# Patient Record
Sex: Female | Born: 1955
Health system: Southern US, Community
[De-identification: ages and names within clinical notes are randomized; demographics above are authoritative.]

## PROBLEM LIST (undated history)

## (undated) DIAGNOSIS — M109 Gout, unspecified: Secondary | ICD-10-CM

## (undated) DIAGNOSIS — I5021 Acute systolic (congestive) heart failure: Secondary | ICD-10-CM

## (undated) DIAGNOSIS — K219 Gastro-esophageal reflux disease without esophagitis: Secondary | ICD-10-CM

## (undated) DIAGNOSIS — I429 Cardiomyopathy, unspecified: Secondary | ICD-10-CM

## (undated) DIAGNOSIS — N3281 Overactive bladder: Secondary | ICD-10-CM

## (undated) DIAGNOSIS — T7840XA Allergy, unspecified, initial encounter: Secondary | ICD-10-CM

## (undated) DIAGNOSIS — I519 Heart disease, unspecified: Secondary | ICD-10-CM

## (undated) DIAGNOSIS — G959 Disease of spinal cord, unspecified: Secondary | ICD-10-CM

## (undated) DIAGNOSIS — M1990 Unspecified osteoarthritis, unspecified site: Secondary | ICD-10-CM

## (undated) DIAGNOSIS — M79606 Pain in leg, unspecified: Secondary | ICD-10-CM

## (undated) DIAGNOSIS — D649 Anemia, unspecified: Secondary | ICD-10-CM

## (undated) DIAGNOSIS — I10 Essential (primary) hypertension: Secondary | ICD-10-CM

## (undated) DIAGNOSIS — I509 Heart failure, unspecified: Secondary | ICD-10-CM

## (undated) DIAGNOSIS — E785 Hyperlipidemia, unspecified: Secondary | ICD-10-CM

## (undated) HISTORY — DX: Anemia, unspecified: D64.9

## (undated) HISTORY — DX: Disease of spinal cord, unspecified: G95.9

## (undated) HISTORY — DX: Gout, unspecified: M10.9

## (undated) HISTORY — PX: COLONOSCOPY: SHX174

## (undated) HISTORY — DX: Cardiomyopathy, unspecified: I42.9

## (undated) HISTORY — DX: Pain in leg, unspecified: M79.606

## (undated) HISTORY — DX: Hyperlipidemia, unspecified: E78.5

## (undated) HISTORY — PX: KNEE ARTHROSCOPY: SUR90

## (undated) HISTORY — DX: Acute systolic (congestive) heart failure: I50.21

## (undated) HISTORY — DX: Overactive bladder: N32.81

## (undated) HISTORY — DX: Heart disease, unspecified: I51.9

## (undated) HISTORY — DX: Allergy, unspecified, initial encounter: T78.40XA

## (undated) HISTORY — DX: Gastro-esophageal reflux disease without esophagitis: K21.9

## (undated) HISTORY — DX: Heart failure, unspecified: I50.9

## (undated) HISTORY — DX: Essential (primary) hypertension: I10

---

## 2011-09-10 ENCOUNTER — Other Ambulatory Visit (HOSPITAL_COMMUNITY): Payer: Self-pay | Admitting: *Deleted

## 2011-09-11 ENCOUNTER — Other Ambulatory Visit: Payer: Self-pay | Admitting: Orthopedic Surgery

## 2011-09-11 ENCOUNTER — Encounter (HOSPITAL_COMMUNITY): Payer: Self-pay

## 2011-09-11 ENCOUNTER — Encounter (HOSPITAL_COMMUNITY)
Admission: RE | Admit: 2011-09-11 | Discharge: 2011-09-11 | Disposition: A | Discharge status: Home / Self Care | Payer: BC Managed Care – PPO | Source: Ambulatory Visit | Attending: Orthopedic Surgery | Admitting: Orthopedic Surgery

## 2011-09-11 HISTORY — DX: Unspecified osteoarthritis, unspecified site: M19.90

## 2011-09-11 LAB — URINALYSIS, ROUTINE W REFLEX MICROSCOPIC
Bilirubin Urine: NEGATIVE
Ketones, ur: NEGATIVE mg/dL
Nitrite: NEGATIVE
Protein, ur: NEGATIVE mg/dL
pH: 6 (ref 5.0–8.0)

## 2011-09-11 LAB — COMPREHENSIVE METABOLIC PANEL
ALT: 29 U/L (ref 0–35)
AST: 28 U/L (ref 0–37)
Alkaline Phosphatase: 96 U/L (ref 39–117)
CO2: 26 mEq/L (ref 19–32)
Chloride: 106 mEq/L (ref 96–112)
GFR calc Af Amer: >90 mL/min (ref >90)
GFR calc non Af Amer: >90 mL/min (ref >90)
Glucose, Bld: 103 mg/dL — ABNORMAL HIGH (ref 70–99)
Potassium: 4.4 mEq/L (ref 3.5–5.1)
Sodium: 141 mEq/L (ref 135–145)
Total Bilirubin: 0.2 mg/dL — ABNORMAL LOW (ref 0.3–1.2)

## 2011-09-11 LAB — DIFFERENTIAL
Basophils Absolute: 0 10*3/uL (ref 0.0–0.1)
Eosinophils Absolute: 0.3 10*3/uL (ref 0.0–0.7)
Eosinophils Relative: 5 % (ref 0–5)
Lymphocytes Relative: 40 % (ref 12–46)
Monocytes Absolute: 0.6 10*3/uL (ref 0.1–1.0)

## 2011-09-11 LAB — CBC
Hemoglobin: 13.2 g/dL (ref 12.0–15.0)
MCH: 29 pg (ref 26.0–34.0)
Platelets: 263 10*3/uL (ref 150–400)
RBC: 4.55 MIL/uL (ref 3.87–5.11)
WBC: 5.2 10*3/uL (ref 4.0–10.5)

## 2011-09-11 LAB — URINE MICROSCOPIC-ADD ON

## 2011-09-11 NOTE — Progress Notes (Signed)
REQUESTED CXR AND EKG FROM [facility].

## 2011-09-11 NOTE — Pre-Procedure Instructions (Signed)
20 Briana Miller  09/11/2011   Your procedure is scheduled on:  Monday  09/22/11  Report to [facility] at 800 AM.  Call this number if you have problems the morning of surgery: [phone]   Remember:   Do not eat food:After Midnight.  May have clear liquids: up to 4 Hours before arrival.  Clear liquids include soda, tea, black coffee, apple or grape juice, broth.  Take these medicines the morning of surgery with A SIP OF WATER: NONE    Do not wear jewelry, make-up or nail polish.  Do not wear lotions, powders, or perfumes. You may wear deodorant.  Do not shave 48 hours prior to surgery.  Do not bring valuables to the hospital.  Contacts, dentures or bridgework may not be worn into surgery.  Leave suitcase in the car. After surgery it may be brought to your room.  For patients admitted to the hospital, checkout time is 11:00 AM the day of discharge.   Patients discharged the day of surgery will not be allowed to drive home.  Name and phone number of your driver:   Special Instructions: CHG Shower Use Special Wash: 1/2 bottle night before surgery and 1/2 bottle morning of surgery.   Please read over the following fact sheets that you were given: Pain Booklet, Coughing and Deep Breathing, Blood Transfusion Information, Total Joint Packet, MRSA Information and Surgical Site Infection Prevention

## 2011-09-12 LAB — URINE CULTURE
Colony Count: NO GROWTH
Culture  Setup Time: 201303281354

## 2011-09-21 MED ORDER — CEFAZOLIN SODIUM-DEXTROSE 2-3 GM-% IV SOLR
2.0000 g | INTRAVENOUS | Status: AC
  Administered 2011-09-22: 2 g via INTRAVENOUS
  Filled 2011-09-21: qty 50

## 2011-09-22 ENCOUNTER — Encounter (HOSPITAL_COMMUNITY): Payer: Self-pay | Admitting: *Deleted

## 2011-09-22 ENCOUNTER — Encounter (HOSPITAL_COMMUNITY): Payer: Self-pay | Admitting: Anesthesiology

## 2011-09-22 ENCOUNTER — Ambulatory Visit (HOSPITAL_COMMUNITY): Payer: BC Managed Care – PPO | Admitting: Anesthesiology

## 2011-09-22 ENCOUNTER — Encounter (HOSPITAL_COMMUNITY)
Admission: RE | Disposition: A | Discharge status: Home Health | Payer: Self-pay | Source: Ambulatory Visit | Attending: Orthopedic Surgery

## 2011-09-22 ENCOUNTER — Inpatient Hospital Stay (HOSPITAL_COMMUNITY)
Admission: RE | Admit: 2011-09-22 | Discharge: 2011-09-25 | DRG: 209 | Disposition: A | Discharge status: Home Health | Payer: BC Managed Care – PPO | Source: Ambulatory Visit | Attending: Orthopedic Surgery | Admitting: Orthopedic Surgery

## 2011-09-22 DIAGNOSIS — Z6841 Body Mass Index (BMI) 40.0 and over, adult: Secondary | ICD-10-CM

## 2011-09-22 DIAGNOSIS — M1710 Unilateral primary osteoarthritis, unspecified knee: Principal | ICD-10-CM | POA: Diagnosis present

## 2011-09-22 DIAGNOSIS — D62 Acute posthemorrhagic anemia: Secondary | ICD-10-CM | POA: Diagnosis not present

## 2011-09-22 DIAGNOSIS — Z01811 Encounter for preprocedural respiratory examination: Secondary | ICD-10-CM

## 2011-09-22 DIAGNOSIS — Z01812 Encounter for preprocedural laboratory examination: Secondary | ICD-10-CM

## 2011-09-22 DIAGNOSIS — M1711 Unilateral primary osteoarthritis, right knee: Secondary | ICD-10-CM

## 2011-09-22 HISTORY — PX: TOTAL KNEE ARTHROPLASTY: SHX125

## 2011-09-22 LAB — PROTIME-INR: Prothrombin Time: 15.2 seconds (ref 11.6–15.2)

## 2011-09-22 LAB — TYPE AND SCREEN: ABO/RH(D): O POS

## 2011-09-22 LAB — APTT: aPTT: 27 seconds (ref 24–37)

## 2011-09-22 SURGERY — ARTHROPLASTY, KNEE, TOTAL
Anesthesia: General | Site: Knee | Laterality: Right | Wound class: Clean

## 2011-09-22 MED ORDER — METOCLOPRAMIDE HCL 10 MG PO TABS: 5.0000 mg | ORAL_TABLET | Freq: Three times a day (TID) | ORAL | Status: DC | PRN

## 2011-09-22 MED ORDER — MIDAZOLAM HCL 2 MG/2ML IJ SOLN
INTRAMUSCULAR | Status: AC
  Filled 2011-09-22: qty 2

## 2011-09-22 MED ORDER — SODIUM CHLORIDE 0.9 % IV SOLN
INTRAVENOUS | Status: DC
  Administered 2011-09-22 – 2011-09-23 (×2): via INTRAVENOUS

## 2011-09-22 MED ORDER — PHENOL 1.4 % MT LIQD: 1.0000 | OROMUCOSAL | Status: DC | PRN

## 2011-09-22 MED ORDER — ONDANSETRON HCL 4 MG/2ML IJ SOLN: 4.0000 mg | Freq: Four times a day (QID) | INTRAMUSCULAR | Status: DC | PRN

## 2011-09-22 MED ORDER — BUPIVACAINE-EPINEPHRINE PF 0.5-1:200000 % IJ SOLN
INTRAMUSCULAR | Status: DC | PRN
  Administered 2011-09-22: 30 mL

## 2011-09-22 MED ORDER — CHLORHEXIDINE GLUCONATE 4 % EX LIQD: 60.0000 mL | Freq: Once | CUTANEOUS | Status: DC

## 2011-09-22 MED ORDER — LACTATED RINGERS IV SOLN
INTRAVENOUS | Status: DC | PRN
  Administered 2011-09-22 (×2): via INTRAVENOUS

## 2011-09-22 MED ORDER — METOCLOPRAMIDE HCL 5 MG/ML IJ SOLN: 5.0000 mg | Freq: Three times a day (TID) | INTRAMUSCULAR | Status: DC | PRN

## 2011-09-22 MED ORDER — DOCUSATE SODIUM 100 MG PO CAPS
100.0000 mg | ORAL_CAPSULE | Freq: Two times a day (BID) | ORAL | Status: DC
  Administered 2011-09-22 – 2011-09-25 (×7): 100 mg via ORAL
  Filled 2011-09-22 (×9): qty 1

## 2011-09-22 MED ORDER — NEOSTIGMINE METHYLSULFATE 1 MG/ML IJ SOLN
INTRAMUSCULAR | Status: DC | PRN
  Administered 2011-09-22: 3 mg via INTRAVENOUS

## 2011-09-22 MED ORDER — GLYCOPYRROLATE 0.2 MG/ML IJ SOLN
INTRAMUSCULAR | Status: DC | PRN
  Administered 2011-09-22: 0.4 mg via INTRAVENOUS

## 2011-09-22 MED ORDER — OXYCODONE HCL 10 MG PO TB12
10.0000 mg | ORAL_TABLET | Freq: Two times a day (BID) | ORAL | Status: DC
  Administered 2011-09-22 – 2011-09-25 (×5): 10 mg via ORAL
  Filled 2011-09-22 (×6): qty 1

## 2011-09-22 MED ORDER — HYDROMORPHONE HCL PF 1 MG/ML IJ SOLN
INTRAMUSCULAR | Status: AC
  Filled 2011-09-22: qty 1

## 2011-09-22 MED ORDER — SODIUM CHLORIDE 0.9 % IV SOLN: INTRAVENOUS | Status: DC

## 2011-09-22 MED ORDER — METHOCARBAMOL 500 MG PO TABS
500.0000 mg | ORAL_TABLET | Freq: Four times a day (QID) | ORAL | Status: DC | PRN
  Administered 2011-09-22 – 2011-09-25 (×5): 500 mg via ORAL
  Filled 2011-09-22 (×6): qty 1

## 2011-09-22 MED ORDER — CELECOXIB 200 MG PO CAPS
200.0000 mg | ORAL_CAPSULE | Freq: Two times a day (BID) | ORAL | Status: DC
  Administered 2011-09-22 – 2011-09-25 (×7): 200 mg via ORAL
  Filled 2011-09-22 (×9): qty 1

## 2011-09-22 MED ORDER — CEFAZOLIN SODIUM-DEXTROSE 2-3 GM-% IV SOLR
2.0000 g | Freq: Four times a day (QID) | INTRAVENOUS | Status: AC
  Administered 2011-09-22 – 2011-09-23 (×3): 2 g via INTRAVENOUS
  Filled 2011-09-22 (×3): qty 50

## 2011-09-22 MED ORDER — ACETAMINOPHEN 650 MG RE SUPP: 650.0000 mg | Freq: Four times a day (QID) | RECTAL | Status: DC | PRN

## 2011-09-22 MED ORDER — BUPIVACAINE ON-Q PAIN PUMP (FOR ORDER SET NO CHG)
INJECTION | Status: DC
  Filled 2011-09-22: qty 1

## 2011-09-22 MED ORDER — METHOCARBAMOL 100 MG/ML IJ SOLN
500.0000 mg | Freq: Four times a day (QID) | INTRAVENOUS | Status: DC | PRN
  Administered 2011-09-22: 500 mg via INTRAVENOUS
  Filled 2011-09-22: qty 5

## 2011-09-22 MED ORDER — DIPHENHYDRAMINE HCL 12.5 MG/5ML PO ELIX: 12.5000 mg | ORAL_SOLUTION | ORAL | Status: DC | PRN

## 2011-09-22 MED ORDER — BUPIVACAINE 0.25 % ON-Q PUMP SINGLE CATH 400 ML
INJECTION | Status: DC | PRN
  Administered 2011-09-22: 300 mL

## 2011-09-22 MED ORDER — ONDANSETRON HCL 4 MG/2ML IJ SOLN
INTRAMUSCULAR | Status: DC | PRN
  Administered 2011-09-22: 4 mg via INTRAVENOUS

## 2011-09-22 MED ORDER — FENTANYL CITRATE 0.05 MG/ML IJ SOLN
INTRAMUSCULAR | Status: DC | PRN
  Administered 2011-09-22: 100 ug via INTRAVENOUS
  Administered 2011-09-22 (×2): 50 ug via INTRAVENOUS
  Administered 2011-09-22: 100 ug via INTRAVENOUS

## 2011-09-22 MED ORDER — CEFAZOLIN SODIUM-DEXTROSE 2-3 GM-% IV SOLR: 2.0000 g | INTRAVENOUS | Status: DC

## 2011-09-22 MED ORDER — ONDANSETRON HCL 4 MG PO TABS: 4.0000 mg | ORAL_TABLET | Freq: Four times a day (QID) | ORAL | Status: DC | PRN

## 2011-09-22 MED ORDER — ACETAMINOPHEN 325 MG PO TABS: 650.0000 mg | ORAL_TABLET | Freq: Four times a day (QID) | ORAL | Status: DC | PRN

## 2011-09-22 MED ORDER — LACTATED RINGERS IV SOLN: INTRAVENOUS | Status: DC

## 2011-09-22 MED ORDER — ACETAMINOPHEN 10 MG/ML IV SOLN: 1000.0000 mg | Freq: Four times a day (QID) | INTRAVENOUS | Status: DC

## 2011-09-22 MED ORDER — FENTANYL CITRATE 0.05 MG/ML IJ SOLN
100.0000 ug | INTRAMUSCULAR | Status: DC | PRN
  Administered 2011-09-22: 100 ug via INTRAVENOUS

## 2011-09-22 MED ORDER — BUPIVACAINE 0.25 % ON-Q PUMP SINGLE CATH 300ML
300.0000 mL | INJECTION | Status: DC
  Filled 2011-09-22: qty 300

## 2011-09-22 MED ORDER — OXYCODONE HCL 5 MG PO TABS
5.0000 mg | ORAL_TABLET | ORAL | Status: DC | PRN
  Administered 2011-09-23 – 2011-09-24 (×5): 10 mg via ORAL
  Administered 2011-09-24 (×2): 5 mg via ORAL
  Administered 2011-09-25: 10 mg via ORAL
  Administered 2011-09-25: 5 mg via ORAL
  Filled 2011-09-22: qty 1
  Filled 2011-09-22 (×4): qty 2
  Filled 2011-09-22: qty 1
  Filled 2011-09-22: qty 2
  Filled 2011-09-22: qty 1
  Filled 2011-09-22: qty 2

## 2011-09-22 MED ORDER — ZOLPIDEM TARTRATE 5 MG PO TABS: 5.0000 mg | ORAL_TABLET | Freq: Every evening | ORAL | Status: DC | PRN

## 2011-09-22 MED ORDER — DROPERIDOL 2.5 MG/ML IJ SOLN
INTRAMUSCULAR | Status: DC | PRN
  Administered 2011-09-22: 0.625 mg via INTRAVENOUS

## 2011-09-22 MED ORDER — ENOXAPARIN SODIUM 40 MG/0.4ML ~~LOC~~ SOLN
40.0000 mg | SUBCUTANEOUS | Status: DC
  Administered 2011-09-23 – 2011-09-25 (×3): 40 mg via SUBCUTANEOUS
  Filled 2011-09-22 (×5): qty 0.4

## 2011-09-22 MED ORDER — BISACODYL 5 MG PO TBEC
5.0000 mg | DELAYED_RELEASE_TABLET | Freq: Every day | ORAL | Status: DC | PRN
  Administered 2011-09-24: 5 mg via ORAL
  Filled 2011-09-22: qty 1

## 2011-09-22 MED ORDER — SODIUM CHLORIDE 0.9 % IR SOLN
Status: DC | PRN
  Administered 2011-09-22: 3000 mL

## 2011-09-22 MED ORDER — FENTANYL CITRATE 0.05 MG/ML IJ SOLN
INTRAMUSCULAR | Status: AC
  Filled 2011-09-22: qty 2

## 2011-09-22 MED ORDER — MIDAZOLAM HCL 2 MG/2ML IJ SOLN
2.0000 mg | INTRAMUSCULAR | Status: DC | PRN
  Administered 2011-09-22: 2 mg via INTRAVENOUS

## 2011-09-22 MED ORDER — BUPIVACAINE-EPINEPHRINE 0.25% -1:200000 IJ SOLN
INTRAMUSCULAR | Status: DC | PRN
  Administered 2011-09-22: 30 mL

## 2011-09-22 MED ORDER — FERROUS SULFATE 325 (65 FE) MG PO TABS
325.0000 mg | ORAL_TABLET | Freq: Three times a day (TID) | ORAL | Status: DC
  Administered 2011-09-22 – 2011-09-25 (×8): 325 mg via ORAL
  Filled 2011-09-22 (×11): qty 1

## 2011-09-22 MED ORDER — FLEET ENEMA 7-19 GM/118ML RE ENEM: 1.0000 | ENEMA | Freq: Once | RECTAL | Status: AC | PRN

## 2011-09-22 MED ORDER — SENNOSIDES-DOCUSATE SODIUM 8.6-50 MG PO TABS
1.0000 | ORAL_TABLET | Freq: Every evening | ORAL | Status: DC | PRN
  Administered 2011-09-24: 1 via ORAL
  Filled 2011-09-22: qty 1

## 2011-09-22 MED ORDER — ROCURONIUM BROMIDE 100 MG/10ML IV SOLN
INTRAVENOUS | Status: DC | PRN
  Administered 2011-09-22: 50 mg via INTRAVENOUS

## 2011-09-22 MED ORDER — HYDROMORPHONE HCL PF 1 MG/ML IJ SOLN
0.5000 mg | INTRAMUSCULAR | Status: DC | PRN
Start: 2011-09-22 — End: 2011-09-25
  Administered 2011-09-22 – 2011-09-25 (×5): 1 mg via INTRAVENOUS
  Filled 2011-09-22 (×4): qty 1

## 2011-09-22 MED ORDER — MENTHOL 3 MG MT LOZG: 1.0000 | LOZENGE | OROMUCOSAL | Status: DC | PRN

## 2011-09-22 MED ORDER — HYDROMORPHONE HCL PF 1 MG/ML IJ SOLN
0.2500 mg | INTRAMUSCULAR | Status: DC | PRN
  Administered 2011-09-22 (×2): 0.5 mg via INTRAVENOUS

## 2011-09-22 MED ORDER — DARIFENACIN HYDROBROMIDE ER 7.5 MG PO TB24
7.5000 mg | ORAL_TABLET | Freq: Every day | ORAL | Status: DC
  Administered 2011-09-22 – 2011-09-25 (×4): 7.5 mg via ORAL
  Filled 2011-09-22 (×4): qty 1

## 2011-09-22 MED ORDER — PROPOFOL 10 MG/ML IV EMUL
INTRAVENOUS | Status: DC | PRN
  Administered 2011-09-22: 200 mg via INTRAVENOUS

## 2011-09-22 SURGICAL SUPPLY — 56 items
BANDAGE ESMARK 6X9 LF (GAUZE/BANDAGES/DRESSINGS) ×1 IMPLANT
BLADE SAGITTAL 13X1.27X60 (BLADE) ×2 IMPLANT
BLADE SAW SGTL 83.5X18.5 (BLADE) ×2 IMPLANT
BNDG ESMARK 6X9 LF (GAUZE/BANDAGES/DRESSINGS) ×2
BOWL SMART MIX CTS (DISPOSABLE) ×2 IMPLANT
CATH KIT ON Q 5IN SLV (PAIN MANAGEMENT) ×2 IMPLANT
CEMENT BONE SIMPLEX SPEEDSET (Cement) ×4 IMPLANT
CLOTH BEACON ORANGE TIMEOUT ST (SAFETY) ×2 IMPLANT
COVER BACK TABLE 24X17X13 BIG (DRAPES) ×2 IMPLANT
COVER SURGICAL LIGHT HANDLE (MISCELLANEOUS) ×2 IMPLANT
CUFF TOURNIQUET SINGLE 34IN LL (TOURNIQUET CUFF) ×2 IMPLANT
DRAPE EXTREMITY T 121X128X90 (DRAPE) ×2 IMPLANT
DRAPE INCISE IOBAN 66X45 STRL (DRAPES) ×4 IMPLANT
DRAPE PROXIMA HALF (DRAPES) ×2 IMPLANT
DRAPE U-SHAPE 47X51 STRL (DRAPES) ×2 IMPLANT
DRSG ADAPTIC 3X8 NADH LF (GAUZE/BANDAGES/DRESSINGS) ×2 IMPLANT
DRSG PAD ABDOMINAL 8X10 ST (GAUZE/BANDAGES/DRESSINGS) ×2 IMPLANT
DURAPREP 26ML APPLICATOR (WOUND CARE) ×4 IMPLANT
ELECT REM PT RETURN 9FT ADLT (ELECTROSURGICAL) ×2
ELECTRODE REM PT RTRN 9FT ADLT (ELECTROSURGICAL) ×1 IMPLANT
EVACUATOR 1/8 PVC DRAIN (DRAIN) ×2 IMPLANT
GLOVE BIOGEL M 7.0 STRL (GLOVE) IMPLANT
GLOVE BIOGEL PI IND STRL 7.5 (GLOVE) IMPLANT
GLOVE BIOGEL PI IND STRL 8.5 (GLOVE) ×2 IMPLANT
GLOVE BIOGEL PI INDICATOR 7.5 (GLOVE)
GLOVE BIOGEL PI INDICATOR 8.5 (GLOVE) ×2
GLOVE SURG ORTHO 8.0 STRL STRW (GLOVE) ×4 IMPLANT
GOWN PREVENTION PLUS XLARGE (GOWN DISPOSABLE) ×4 IMPLANT
GOWN STRL NON-REIN LRG LVL3 (GOWN DISPOSABLE) ×4 IMPLANT
HANDPIECE INTERPULSE COAX TIP (DISPOSABLE) ×1
HOOD PEEL AWAY FACE SHEILD DIS (HOOD) ×8 IMPLANT
KIT BASIN OR (CUSTOM PROCEDURE TRAY) ×2 IMPLANT
KIT ROOM TURNOVER OR (KITS) ×2 IMPLANT
MANIFOLD NEPTUNE II (INSTRUMENTS) ×2 IMPLANT
NEEDLE 22X1 1/2 (OR ONLY) (NEEDLE) IMPLANT
NS IRRIG 1000ML POUR BTL (IV SOLUTION) ×2 IMPLANT
PACK TOTAL JOINT (CUSTOM PROCEDURE TRAY) ×2 IMPLANT
PAD ARMBOARD 7.5X6 YLW CONV (MISCELLANEOUS) ×4 IMPLANT
PADDING CAST COTTON 6X4 STRL (CAST SUPPLIES) ×2 IMPLANT
POSITIONER HEAD PRONE TRACH (MISCELLANEOUS) ×2 IMPLANT
SET HNDPC FAN SPRY TIP SCT (DISPOSABLE) ×1 IMPLANT
SPONGE GAUZE 4X4 12PLY (GAUZE/BANDAGES/DRESSINGS) ×2 IMPLANT
STAPLER VISISTAT 35W (STAPLE) ×2 IMPLANT
SUCTION FRAZIER TIP 10 FR DISP (SUCTIONS) IMPLANT
SUT BONE WAX W31G (SUTURE) ×2 IMPLANT
SUT VIC AB 0 CTB1 27 (SUTURE) ×4 IMPLANT
SUT VIC AB 1 CT1 27 (SUTURE) ×4
SUT VIC AB 1 CT1 27XBRD ANBCTR (SUTURE) ×4 IMPLANT
SUT VIC AB 2-0 CT1 27 (SUTURE) ×2
SUT VIC AB 2-0 CT1 TAPERPNT 27 (SUTURE) ×2 IMPLANT
SUT VLOC 180 0 24IN GS25 (SUTURE) ×2 IMPLANT
SYR CONTROL 10ML LL (SYRINGE) IMPLANT
TOWEL OR 17X24 6PK STRL BLUE (TOWEL DISPOSABLE) ×2 IMPLANT
TOWEL OR 17X26 10 PK STRL BLUE (TOWEL DISPOSABLE) ×2 IMPLANT
TRAY FOLEY CATH 14FR (SET/KITS/TRAYS/PACK) ×2 IMPLANT
WATER STERILE IRR 1000ML POUR (IV SOLUTION) ×6 IMPLANT

## 2011-09-22 NOTE — H&P (Signed)
  Briana Miller MRN:  [id number] DOB/SEX:  [birth year]/female  CHIEF COMPLAINT:  Painful right Knee  HISTORY: Patient is a 56 y.o. female presented with a history of pain in the right knee. Onset of symptoms was gradual starting several years ago with gradually worsening course since that time. The patient noted no past surgery on the right knee. Prior procedures on the knee include arthroscopy. Patient has been treated conservatively with over-the-counter NSAIDs and activity modification. Patient currently rates pain in the knee at several out of 10 with activity. There is pain at night.  PAST MEDICAL HISTORY: There are no active problems to display for this patient.  Past Medical History  Diagnosis Date  . Arthritis    Past Surgical History  Procedure Date  . Knee arthroscopy     2007     MEDICATIONS:   No prescriptions prior to admission    ALLERGIES:  No Known Allergies  REVIEW OF SYSTEMS:  Pertinent items are noted in HPI.   FAMILY HISTORY:  No family history on file.  SOCIAL HISTORY:   History  Substance Use Topics  . Smoking status: Not on file  . Smokeless tobacco: Not on file  . Alcohol Use:      RED WINE    OCC     EXAMINATION:  Vital signs in last 24 hours:    General appearance: alert, cooperative and no distress Lungs: clear to auscultation bilaterally Heart: regular rate and rhythm, S1, S2 normal, no murmur, click, rub or gallop Abdomen: soft, non-tender; bowel sounds normal; no masses,  no organomegaly Extremities: extremities normal, atraumatic, no cyanosis or edema and Homans sign is negative, no sign of DVT Pulses: 2+ and symmetric Skin: Skin color, texture, turgor normal. No rashes or lesions Neurologic: Alert and oriented X 3, normal strength and tone. Normal symmetric reflexes. Normal coordination and gait  Musculoskeletal:  ROM 0-115, Ligaments intact,  Imaging Review Plain radiographs demonstrate severe degenerative joint disease of the  right knee. The overall alignment is mild valgus. The bone quality appears to be good for age and reported activity level.  Assessment/Plan: End stage arthritis, right knee   The patient history, physical examination and imaging studies are consistent with advanced degenerative joint disease of the right knee. The patient has failed conservative treatment.  The clearance notes were reviewed.  After discussion with the patient it was felt that Total Knee Replacement was indicated. The procedure,  risks, and benefits of total knee arthroplasty were presented and reviewed. The risks including but not limited to aseptic loosening, infection, blood clots, vascular injury, stiffness, patella tracking problems complications among others were discussed. The patient acknowledged the explanation, agreed to proceed with the plan.  Briana Miller 09/22/2011, 7:32 AM

## 2011-09-22 NOTE — Transfer of Care (Signed)
Immediate Anesthesia Transfer of Care Note  Patient: Briana Miller  Procedure(s) Performed: Procedure(s) (LRB): TOTAL KNEE ARTHROPLASTY (Right)  Patient Location: PACU  Anesthesia Type: General  Level of Consciousness: awake and alert   Airway & Oxygen Therapy: Patient Spontanous Breathing and Patient connected to nasal cannula oxygen  Post-op Assessment: Report given to PACU RN and Post -op Vital signs reviewed and stable  Post vital signs: Reviewed and stable  Complications: No apparent anesthesia complications

## 2011-09-22 NOTE — Anesthesia Preprocedure Evaluation (Signed)
Anesthesia Evaluation  Patient identified by MRN, date of birth, ID band Patient awake    Reviewed: Allergy & Precautions, H&P , NPO status , Patient's Chart, lab work & pertinent test results  Airway Mallampati: II TM Distance: >3 FB Neck ROM: Full    Dental No notable dental hx. (+) Teeth Intact   Pulmonary neg pulmonary ROS,  breath sounds clear to auscultation  Pulmonary exam normal       Cardiovascular negative cardio ROS  Rhythm:Regular Rate:Normal     Neuro/Psych negative neurological ROS  negative psych ROS   GI/Hepatic negative GI ROS, Neg liver ROS,   Endo/Other  negative endocrine ROSMorbid obesity  Renal/GU negative Renal ROS  negative genitourinary   Musculoskeletal   Abdominal (+) + obese,   Peds  Hematology negative hematology ROS (+)   Anesthesia Other Findings   Reproductive/Obstetrics negative OB ROS                           Anesthesia Physical Anesthesia Plan  ASA: III  Anesthesia Plan: General   Post-op Pain Management:    Induction: Intravenous  Airway Management Planned: Oral ETT  Additional Equipment:   Intra-op Plan:   Post-operative Plan: Extubation in OR  Informed Consent: I have reviewed the patients History and Physical, chart, labs and discussed the procedure including the risks, benefits and alternatives for the proposed anesthesia with the patient or authorized representative who has indicated his/her understanding and acceptance.   Dental advisory given  Plan Discussed with: CRNA  Anesthesia Plan Comments:         Anesthesia Quick Evaluation

## 2011-09-22 NOTE — Anesthesia Postprocedure Evaluation (Signed)
  Anesthesia Post-op Note  Patient: Briana Miller  Procedure(s) Performed: Procedure(s) (LRB): TOTAL KNEE ARTHROPLASTY (Right)  Patient Location: PACU  Anesthesia Type: GA combined with regional for post-op pain  Level of Consciousness: awake  Airway and Oxygen Therapy: Patient Spontanous Breathing and Patient connected to nasal cannula oxygen  Post-op Pain: mild  Post-op Assessment: Post-op Vital signs reviewed, Patient's Cardiovascular Status Stable, Respiratory Function Stable and Patent Airway  Post-op Vital Signs: Reviewed and stable  Complications: No apparent anesthesia complications

## 2011-09-22 NOTE — Anesthesia Procedure Notes (Signed)
Anesthesia Regional Block:  Femoral nerve block  Pre-Anesthetic Checklist: ,, timeout performed, Correct Patient, Correct Site, Correct Laterality, Correct Procedure, Correct Position, site marked, Risks and benefits discussed, pre-op evaluation,  At surgeon's request and post-op pain management  Laterality: Right  Prep: Maximum Sterile Barrier Precautions used and chloraprep       Needles:  Injection technique: Single-shot  Needle Type: Echogenic Stimulator Needle      Needle Gauge: 22 and 22 G    Additional Needles:  Procedures: ultrasound guided and nerve stimulator Femoral nerve block  Nerve Stimulator or Paresthesia:  Response: Patellar respose,   Additional Responses:   Narrative:  Start time: 09/22/2011 9:45 AM End time: 09/22/2011 10:00 AM Injection made incrementally with aspirations every 5 mL. Anesthesiologist: Abbye Lao,MD  Additional Notes: 2% Lidocaine skin wheel.   Femoral nerve block

## 2011-09-22 NOTE — Progress Notes (Signed)
Orthopedic Tech Progress Note Patient Details:  Briana Miller 09-Mar-1956 [id number]  CPM Right Knee CPM Right Knee: On Right Knee Flexion (Degrees): 90  Right Knee Extension (Degrees): 0  Additional Comments: trapeze bar   Cammer, Mickie Bail 09/22/2011, 1:27 PM

## 2011-09-23 ENCOUNTER — Encounter (HOSPITAL_COMMUNITY): Payer: Self-pay | Admitting: Orthopedic Surgery

## 2011-09-23 LAB — BASIC METABOLIC PANEL
BUN: 19 mg/dL (ref 6–23)
CO2: 25 mEq/L (ref 19–32)
Chloride: 102 mEq/L (ref 96–112)
Glucose, Bld: 126 mg/dL — ABNORMAL HIGH (ref 70–99)
Potassium: 4 mEq/L (ref 3.5–5.1)

## 2011-09-23 LAB — CBC
HCT: 30.8 % — ABNORMAL LOW (ref 36.0–46.0)
Hemoglobin: 10.7 g/dL — ABNORMAL LOW (ref 12.0–15.0)
RBC: 3.75 MIL/uL — ABNORMAL LOW (ref 3.87–5.11)
WBC: 8.5 10*3/uL (ref 4.0–10.5)

## 2011-09-23 LAB — TYPE AND SCREEN
ABO/RH(D): O POS
Antibody Screen: NEGATIVE

## 2011-09-23 NOTE — Progress Notes (Signed)
Physical Therapy Evaluation Patient Details Name: Briana Miller MRN: [id number] DOB: [birth year] Today's Date: 09/23/2011  Problem List: There is no problem list on file for this patient.   Past Medical History:  Past Medical History  Diagnosis Date  . Arthritis    Past Surgical History:  Past Surgical History  Procedure Date  . Knee arthroscopy     2007    PT Assessment/Plan/Recommendation PT Assessment Clinical Impression Statement: Pt is a 56 y/o female admitted s/p right TKA along with the below PT problem list.  Pt greatly limited this am by lethargy and will benefit from acute PT to maximize independence and facilitate d/c hopefully home with HHPT.  If independence with mobility does not improve, pt will need STSNF prior to d/c home. PT Recommendation/Assessment: Patient will need skilled PT in the acute care venue PT Problem List: Decreased strength;Decreased range of motion;Decreased activity tolerance;Decreased balance;Decreased mobility;Decreased cognition;Decreased knowledge of use of DME;Decreased knowledge of precautions;Pain Barriers to Discharge: None PT Therapy Diagnosis : Difficulty walking;Acute pain PT Plan PT Frequency: 7X/week PT Treatment/Interventions: DME instruction;Gait training;Functional mobility training;Therapeutic activities;Therapeutic exercise;Balance training;Patient/family education PT Recommendation Follow Up Recommendations: Home health PT (? SNF if pt does not progress.) Equipment Recommended: None recommended by PT PT Goals  Acute Rehab PT Goals PT Goal Formulation: With patient/family Time For Goal Achievement: 7 days Pt will go Supine/Side to Sit: with modified independence PT Goal: Supine/Side to Sit - Progress: Goal set today Pt will go Sit to Supine/Side: with modified independence PT Goal: Sit to Supine/Side - Progress: Goal set today Pt will go Sit to Stand: with modified independence PT Goal: Sit to Stand - Progress: Goal set  today Pt will go Stand to Sit: with modified independence PT Goal: Stand to Sit - Progress: Goal set today Pt will Transfer Bed to Chair/Chair to Bed: with modified independence PT Transfer Goal: Bed to Chair/Chair to Bed - Progress: Goal set today Pt will Ambulate: >150 feet;with modified independence;with least restrictive assistive device PT Goal: Ambulate - Progress: Goal set today Pt will Go Up / Down Stairs: 3-5 stairs;with min assist;with least restrictive assistive device PT Goal: Up/Down Stairs - Progress: Goal set today Pt will Perform Home Exercise Program: Independently PT Goal: Perform Home Exercise Program - Progress: Goal set today  PT Evaluation Precautions/Restrictions  Precautions Precautions: Knee Precaution Booklet Issued: No Required Braces or Orthoses: No Restrictions Weight Bearing Restrictions: Yes RLE Weight Bearing: Weight bearing as tolerated Prior Functioning  Home Living Lives With: Spouse Type of Home: House Home Layout: One level Home Access: Stairs to enter Entrance Stairs-Rails: None Entrance Stairs-Number of Steps: 1 or 4 Bathroom Shower/Tub: Psychologist, counselling;Door [employer] Toilet: Standard Bathroom Accessibility: Yes How Accessible: Accessible via walker Home Adaptive Equipment: Bedside commode/3-in-1;Walker - rolling Prior Function Level of Independence: Independent with basic ADLs;Independent with homemaking with ambulation;Independent with gait;Independent with transfers Able to Take Stairs?: Yes Driving: Yes Vocation: Full time employment Vocation Requirements: Lots of standing and some sitting. Cognition Cognition Arousal/Alertness: Awake/alert Overall Cognitive Status: Appears within functional limits for tasks assessed Orientation Level: Oriented X4 Sensation/Coordination Sensation Light Touch: Appears Intact Stereognosis: Not tested Hot/Cold: Not tested Proprioception: Not tested Coordination Gross Motor Movements are Fluid  and Coordinated: Yes Fine Motor Movements are Fluid and Coordinated: Yes Extremity Assessment RUE Assessment RUE Assessment: Within Functional Limits LUE Assessment LUE Assessment: Within Functional Limits RLE Assessment RLE Assessment: Exceptions to [facility] RLE PROM (degrees) Right Knee Extension 0-130: 0  Right Knee Flexion 0-140: 55  RLE Strength RLE Overall Strength: Deficits;Due to pain RLE Overall Strength Comments: 2/5 LLE Assessment LLE Assessment: Within Functional Limits Pain 8/10 in right knee.  Pt premedicated and repositioned after treatment. Mobility (including Balance) Bed Mobility Bed Mobility: Yes Supine to Sit: 1: +2 Total assist;Patient percentage (comment);HOB flat ((pt=60%)) Supine to Sit Details (indicate cue type and reason): Assist to right LE and trunk to translate to EOB with max cues for sequence due to lethargy. Transfers Transfers: Yes Sit to Stand: 1: +2 Total assist;Patient percentage (comment);With upper extremity assist;From bed ((pt=50%)) Sit to Stand Details (indicate cue type and reason): Assist to translate trunk anterior, for balance, and to off weight right LE due to buckling.  Max cues to extend bilateral LEs and for tall posture. Stand to Sit: 1: +2 Total assist;Patient percentage (comment);With upper extremity assist;To chair/3-in-1 ((pt=50%)) Stand to Sit Details: Assist to slow descent to chair with cues for safest hand/right LE placement.  Pt with decreased participation due to lethargy. Stand Pivot Transfers: 1: +2 Total assist;Patient percentage (comment) ((pt=50%)) Stand Pivot Transfer Details (indicate cue type and reason): Assist to facilitate rotation of hips/pelvis with max cues for sequence.  Assist to also off weight right LE due to buckling.  Limited participation due to lethargy. Ambulation/Gait Ambulation/Gait: No Stairs: No Wheelchair Mobility Wheelchair Mobility: No  Posture/Postural Control Posture/Postural Control: No  significant limitations Balance Balance Assessed: No Exercise  Total Joint Exercises Ankle Circles/Pumps: AAROM;Right;10 reps;Supine Quad Sets: AAROM;Right;10 reps;Supine Heel Slides: AAROM;Right;10 reps;Supine End of Session PT - End of Session Equipment Utilized During Treatment: Gait belt Activity Tolerance: Patient tolerated treatment well (Limited by lethargy.) Patient left: in chair;with call bell in reach;with family/visitor present Nurse Communication: Mobility status for transfers General Behavior During Session: Lethargic Cognition: WFL for tasks performed  Cephus Shelling 09/23/2011, 12:37 PM  09/23/2011 Cephus Shelling, PT, DPT [phone]

## 2011-09-23 NOTE — Progress Notes (Signed)
Physical Therapy Treatment Patient Details Name: Briana Miller MRN: [id number] DOB: 03/01/1956 Today's Date: 09/23/2011  PT Assessment/Plan  PT - Assessment/Plan Comments on Treatment Session: Pt admitted s/p right TKA and was much more alert this pm.  Pt able to tolerate transfers again this pm as well as therapeutic exercises. PT Plan: Discharge plan remains appropriate;Frequency remains appropriate PT Frequency: 7X/week Follow Up Recommendations: Home health PT Equipment Recommended: None recommended by PT PT Goals  Acute Rehab PT Goals PT Goal Formulation: With patient/family Time For Goal Achievement: 7 days PT Goal: Sit to Supine/Side - Progress: Progressing toward goal PT Goal: Sit to Stand - Progress: Progressing toward goal PT Goal: Stand to Sit - Progress: Progressing toward goal PT Transfer Goal: Bed to Chair/Chair to Bed - Progress: Progressing toward goal PT Goal: Perform Home Exercise Program - Progress: Progressing toward goal  PT Treatment Precautions/Restrictions  Precautions Precautions: Knee Precaution Booklet Issued: No Required Braces or Orthoses: No Restrictions Weight Bearing Restrictions: Yes RLE Weight Bearing: Weight bearing as tolerated Pain 5/10 in right LE.  Pt repositioned with RN aware. Mobility (including Balance) Bed Mobility Bed Mobility: Yes Supine to Sit: Not tested (comment) Sit to Supine: 1: +2 Total assist;Patient percentage (comment);HOB flat ((pt=60%)) Sit to Supine - Details (indicate cue type and reason): Assist to lower trunk to bed and facilitate motion back onto bed.  Cues for sequence. Transfers Transfers: Yes Sit to Stand: 1: +2 Total assist;Patient percentage (comment);With upper extremity assist;From chair/3-in-1 ((pt=60%); 2 trials.) Sit to Stand Details (indicate cue type and reason): Assist to translate trunk anterior while blocking right knee to prevent buckling.  Cues for sequence. Stand to Sit: 1: +2 Total  assist;Patient percentage (comment);With upper extremity assist;To chair/3-in-1 ((pt=60%); 2 trials.) Stand to Sit Details: Assist to slow descent to surface with cues to slow descent and for hand/right LE placement. Stand Pivot Transfers: 1: +2 Total assist;Patient percentage (comment) ((pt=60%)) Stand Pivot Transfer Details (indicate cue type and reason): Assist to facilitate rotation of pelvis to surface.  Assist for balance with cues for sequence to pivot on left toes.   Ambulation/Gait Ambulation/Gait: No Stairs: No Wheelchair Mobility Wheelchair Mobility: No  Posture/Postural Control Posture/Postural Control: No significant limitations Balance Balance Assessed: No Exercise  Total Joint Exercises Ankle Circles/Pumps: AROM;Right;10 reps;Supine Quad Sets: AROM;Right;10 reps;Supine Heel Slides: AAROM;Right;10 reps;Supine Hip ABduction/ADduction: AAROM;Right;10 reps;Supine Straight Leg Raises: AAROM;Right;10 reps;Supine End of Session PT - End of Session Equipment Utilized During Treatment: Gait belt Activity Tolerance: Patient tolerated treatment well Patient left: in bed;in CPM;with call bell in reach Nurse Communication: Mobility status for transfers General Behavior During Session: [facility] for tasks performed Cognition: [facility] for tasks performed  Cephus Shelling 09/23/2011, 3:24 PM  09/23/2011 Cephus Shelling, PT, DPT [phone]

## 2011-09-23 NOTE — Progress Notes (Signed)
Occupational Therapy Evaluation Patient Details Name: Briana Miller MRN: [id number] DOB: Jul 27, 1955 Today's Date: 09/23/2011  Problem List: There is no problem list on file for this patient.   Past Medical History:  Past Medical History  Diagnosis Date  . Arthritis    Past Surgical History:  Past Surgical History  Procedure Date  . Knee arthroscopy     2007    OT Assessment/Plan/Recommendation OT Assessment Clinical Impression Statement: 56 yo s/p R TKA. Pt will benefit from skilled OT secondary to deficits listed below to return to PLOF. Pt will most likely not need further OT after D/C. OT Recommendation/Assessment: Patient will need skilled OT in the acute care venue OT Problem List: Decreased strength;Decreased range of motion;Decreased activity tolerance;Impaired balance (sitting and/or standing);Decreased safety awareness;Decreased knowledge of use of DME or AE;Decreased knowledge of precautions;Obesity;Pain Barriers to Discharge: None OT Therapy Diagnosis : Generalized weakness;Acute pain OT Plan OT Frequency: Min 2X/week OT Treatment/Interventions: Self-care/ADL training;Energy conservation;DME and/or AE instruction;Patient/family education;Therapeutic activities OT Recommendation Follow Up Recommendations: No OT follow up;Other (comment) (pending progress) Equipment Recommended: Other (comment) (5 " wheels for RW) Individuals Consulted Consulted and Agree with Results and Recommendations: Patient OT Goals Acute Rehab OT Goals OT Goal Formulation: With patient Time For Goal Achievement: 7 days ADL Goals Pt Will Perform Lower Body Bathing: with supervision;Unsupported;with adaptive equipment;with cueing (comment type and amount);Sit to stand from chair ADL Goal: Lower Body Bathing - Progress: Goal set today Pt Will Perform Lower Body Dressing: with supervision;Unsupported;with adaptive equipment;with cueing (comment type and amount);Sit to stand from chair ADL Goal:  Lower Body Dressing - Progress: Goal set today Pt Will Transfer to Toilet: with supervision;with DME;Ambulation;3-in-1;with cueing (comment type and amount) ADL Goal: Toilet Transfer - Progress: Goal set today Pt Will Perform Toileting - Clothing Manipulation: Independently;Standing ADL Goal: Toileting - Clothing Manipulation - Progress: Goal set today Pt Will Perform Toileting - Hygiene: Independently;Sit to stand from 3-in-1/toilet ADL Goal: Toileting - Hygiene - Progress: Goal set today Pt Will Perform Tub/Shower Transfer: with supervision;Anterior-posterior transfer;with cueing (comment type and amount) ADL Goal: Tub/Shower Transfer - Progress: Goal set today  OT Evaluation Precautions/Restrictions  Precautions Precautions: Knee Precaution Booklet Issued: No Required Braces or Orthoses: No Restrictions Weight Bearing Restrictions: Yes RLE Weight Bearing: Weight bearing as tolerated Prior Functioning Home Living Lives With: Spouse Type of Home: House Home Layout: One level Home Access: Stairs to enter Entrance Stairs-Rails: None Entrance Stairs-Number of Steps: 1 or 4 Bathroom Shower/Tub: Psychologist, counselling;Door [employer] Toilet: Standard Bathroom Accessibility: Yes How Accessible: Accessible via walker Home Adaptive Equipment: Bedside commode/3-in-1;Walker - rolling Prior Function Level of Independence: Independent with basic ADLs;Independent with homemaking with ambulation;Independent with gait;Independent with transfers Able to Take Stairs?: Yes Driving: Yes Vocation: Full time employment Vocation Requirements: Lots of standing and some sitting. ADL ADL Eating/Feeding: Performed;Independent Where Assessed - Eating/Feeding: Chair Grooming: Performed;Supervision/safety;Set up Where Assessed - Grooming: Standing at sink Upper Body Bathing: Simulated;Set up Where Assessed - Upper Body Bathing: Sitting, chair Lower Body Bathing: Simulated;Maximal assistance Where Assessed -  Lower Body Bathing: Sit to stand from chair Upper Body Dressing: Simulated;Set up Where Assessed - Upper Body Dressing: Sitting, chair Lower Body Dressing: Simulated;Maximal assistance Where Assessed - Lower Body Dressing: Sit to stand from chair (+2 for transfer) Toilet Transfer: +2 Total assistance Toilet Transfer Method: Stand pivot Toilet Transfer Equipment: Extra wide bedside commode Toileting - Clothing Manipulation: Moderate assistance Toileting - Hygiene: Simulated;Supervision/safety Tub/Shower Transfer: Not assessed;Other (comment) (discussed technique) Ambulation Related to ADLs: unable  at this time with +1. R knee buckles ADL Comments: Daughter plans to stay with Mom for 2-3 weeks to help as needed. A for LB ADL. would benefit from AE. Vision/Perception  Vision - History Baseline Vision: No visual deficits Cognition Cognition Arousal/Alertness: Awake/alert Overall Cognitive Status: Appears within functional limits for tasks assessed Orientation Level: Oriented X4 Sensation/Coordination Sensation Light Touch: Appears Intact Stereognosis: Not tested Hot/Cold: Not tested Proprioception: Not tested Coordination Gross Motor Movements are Fluid and Coordinated: Yes Fine Motor Movements are Fluid and Coordinated: Yes Extremity Assessment RUE Assessment RUE Assessment: Within Functional Limits LUE Assessment LUE Assessment: Within Functional Limits Mobility  Bed Mobility Bed Mobility: No Transfers Sit to Stand: 1: +2 Total assist Sit to Stand Details (indicate cue type and reason): difficulty with transitioning weight forward. c/o R leg numbness. Exercises   End of Session OT - End of Session Activity Tolerance: Patient limited by pain Patient left: in chair;with call bell in reach General Behavior During Session: Other (comment) (apparnet sleepiness due to pain meds) Cognition: [facility] for tasks performed   Briana Miller,Briana Miller 09/23/2011, 1:52 PM  [facility], OTR/L    [phone] 09/23/2011

## 2011-09-23 NOTE — Progress Notes (Signed)
Georgena Spurling, MD   Altamese Cabal, PA-C [address] [place], [place], [place]  [address]                             [phone]   PROGRESS NOTE  Subjective:  negative for Chest Pain  negative for Shortness of Breath  negative for Nausea/Vomiting   negative for Calf Pain  negative for Bowel Movement   Tolerating Diet: yes         Patient reports pain as 3 on 0-10 scale.    Objective: Vital signs in last 24 hours:   Patient Vitals for the past 24 hrs:  BP Temp Temp src Pulse Resp SpO2  09/23/11 0539 142/81 mmHg 98.1 F (36.7 C) - 95  18  98 %  09/22/11 2210 156/94 mmHg 97.6 F (36.4 C) - 92  16  100 %  09/22/11 1449 160/82 mmHg 97.9 F (36.6 C) Oral 85  15  100 %  09/22/11 1415 - 97.8 F (36.6 C) - 81  13  100 %  09/22/11 1408 132/70 mmHg - - 82  15  100 %  09/22/11 1400 - - - 79  14  100 %  09/22/11 1353 117/72 mmHg - - 77  14  100 %  09/22/11 1345 - - - 79  14  100 %  09/22/11 1338 131/69 mmHg - - 80  16  100 %  09/22/11 1330 - - - 75  17  100 %  09/22/11 1323 132/81 mmHg - - 76  18  100 %  09/22/11 1315 - - - 80  22  100 %  09/22/11 1308 141/76 mmHg - - 78  17  100 %  09/22/11 1300 - - - 77  22  99 %  09/22/11 1253 127/68 mmHg - - 77  17  99 %  09/22/11 1245 - - - 76  17  100 %  09/22/11 1238 143/78 mmHg - - 75  20  100 %  09/22/11 1230 - - - 78  24  100 %  09/22/11 1223 132/81 mmHg - - - - -  09/22/11 1222 - 97.7 F (36.5 C) - 78  18  100 %  09/22/11 1001 - - - 84  18  100 %  09/22/11 0958 - - - 82  18  100 %  09/22/11 0955 - - - 81  18  100 %  09/22/11 0950 - - - 83  17  100 %  09/22/11 0948 - - - 87  15  100 %  09/22/11 0947 - - - 85  15  100 %  09/22/11 0946 - - - 88  23  100 %  09/22/11 0945 - - - 86  23  100 %  09/22/11 0944 - - - 84  21  100 %  09/22/11 0942 - - - 83  18  100 %  09/22/11 0838 149/64 mmHg 98.2 F (36.8 C) Oral 83  18  100 %    @flow {1959:LAST@   Intake/Output from previous day:   04/08 0701 - 04/09 0700 In: 2960 [P.O.:960;  I.V.:1950] Out: 1300 [Urine:800; Drains:425]   Intake/Output this shift:       Intake/Output      04/08 0701 - 04/09 0700 04/09 0701 - 04/10 0700   P.O. 960    I.V. 1950    IV Piggyback 50    Total Intake 2960  Urine 800    Drains 425    Blood 75    Total Output 1300    Net +1660            LABORATORY DATA:  Basename 09/23/11 0620  WBC 8.5  HGB 10.7*  HCT 30.8*  PLT 213    Basename 09/23/11 0620  NA 136  K 4.0  CL 102  CO2 25  BUN 19  CREATININE 0.96  GLUCOSE 126*  CALCIUM 8.5   Lab Results  Component Value Date   INR 1.18 09/22/2011    Examination:  General appearance: alert, cooperative and no distress Extremities: Homans sign is negative, no sign of DVT  Wound Exam: clean, dry, intact   Drainage:  Scant/small amount Serosanguinous exudate  Motor Exam: EHL and FHL Intact  Sensory Exam: Deep Peroneal normal  Vascular Exam:    Assessment:    1 Day Post-Op  Procedure(s) (LRB): TOTAL KNEE ARTHROPLASTY (Right)  ADDITIONAL DIAGNOSIS:  Active Problems:  * No active hospital problems. *   Acute Blood Loss Anemia   Plan: Physical Therapy as ordered Weight Bearing as Tolerated (WBAT)  DVT Prophylaxis:  Lovenox  DISCHARGE PLAN: Home  DISCHARGE NEEDS: HHPT, CPM, Walker and 3-in-1 comode seat         Frank Pilger 09/23/2011, 8:33 AM

## 2011-09-24 LAB — BASIC METABOLIC PANEL
BUN: 14 mg/dL (ref 6–23)
CO2: 26 mEq/L (ref 19–32)
Calcium: 8.7 mg/dL (ref 8.4–10.5)
GFR calc non Af Amer: >90 mL/min (ref >90)
Glucose, Bld: 106 mg/dL — ABNORMAL HIGH (ref 70–99)
Potassium: 3.8 mEq/L (ref 3.5–5.1)

## 2011-09-24 LAB — CBC
HCT: 28.2 % — ABNORMAL LOW (ref 36.0–46.0)
Hemoglobin: 9.7 g/dL — ABNORMAL LOW (ref 12.0–15.0)
MCH: 28.2 pg (ref 26.0–34.0)
MCHC: 34.4 g/dL (ref 30.0–36.0)
RBC: 3.44 MIL/uL — ABNORMAL LOW (ref 3.87–5.11)

## 2011-09-24 NOTE — Progress Notes (Signed)
CARE MANAGEMENT NOTE 09/24/2011  Patient:  Briana Miller, Briana Miller   Account Number:  [id number]  Date Initiated:  09/24/2011  Documentation initiated by:  Vance Peper  Subjective/Objective Assessment:   56 yr old female s/p right total knee arthroplasty     Action/Plan:   Spoke with patient regarding home health needs. preoperatively setup with [facility], no changes made. Patient requested CM order a rolling walker. CPM and 3in1 have been delivered. Contacted [email or web address]   Anticipated DC Date:  09/25/2011   Anticipated DC Plan:  HOME W HOME HEALTH SERVICES      DC Planning Services  CM consult      PAC Choice  DURABLE MEDICAL EQUIPMENT  HOME HEALTH   Choice offered to / List presented to:  C-1 Patient   DME arranged  WALKER - ROLLING      DME agency  [employer]     HH arranged  HH-2 PT      [facility] agency  [facility]   Status of service:  Completed, signed off   Discharge Disposition:  HOME W HOME HEALTH SERVICES

## 2011-09-24 NOTE — Progress Notes (Addendum)
PT Progress Note:     09/24/11 1200  PT Visit Information  Last PT Received On 09/24/11  Precautions  Precautions Knee  Precaution Booklet Issued No  Required Braces or Orthoses No  Restrictions  Weight Bearing Restrictions Yes  RLE Weight Bearing WBAT  Bed Mobility  Bed Mobility Yes  Supine to Sit 3: Mod assist;HOB flat  Supine to Sit Details (indicate cue type and reason) Cues for sequencing & technique.  (A) for RLE.    Transfers  Transfers Yes  Sit to Stand 1: +2 Total assist;With upper extremity assist;From bed;Patient percentage (comment) (pt=50%)  Sit to Stand Details (indicate cue type and reason) (A) to achieve standing, anterior translation of trunk, balance, & safety.  Cues for initiation, hand placement, sequencing, & technique, & to push through both UE's evenly.     Stand to Sit 2: Max assist;To chair/3-in-1;With armrests;With upper extremity assist  Stand to Sit Details Cues for hand placement & use of UE's to control descent.  (A) to control descent & safety.  Pt "plopping"   Ambulation/Gait  Ambulation/Gait Yes  Ambulation/Gait Assistance 3: Mod assist;Other (comment) (2nd person to follow with recliner for safety)  Ambulation/Gait Assistance Details (indicate cue type and reason) Max cues for sequencing, advancement of RW, upright posture- pt flexed at hips, technique, lateral wt shifting to Rt. to advance Lt LE.  (A) for balance, advancement of RW, safety.  2nd person following with recliner.    Ambulation Distance (Feet) 60 Feet  Assistive device Rolling walker  Gait Pattern Step-to pattern;Decreased step length - left;Decreased stance time - right;Decreased hip/knee flexion - right  Stairs No  Wheelchair Mobility  Wheelchair Mobility No  Posture/Postural Control  Posture/Postural Control No significant limitations  Balance  Balance Assessed No  Exercises  Exercises Total Joint  Total Joint Exercises  Ankle Circles/Pumps AROM;Right;10 reps;Supine  Quad  Sets AROM;Right;10 reps;Supine  Hip ABduction/ADduction AAROM;Strengthening;Right;10 reps;Supine  Straight Leg Raises AAROM;Strengthening;Right;10 reps;Supine  Long Arc Quad Right;AROM;Strengthening;10 reps;Seated  PT - End of Session  Equipment Utilized During Treatment Gait belt  Activity Tolerance Patient tolerated treatment well  Patient left in chair;with call bell in reach  Nurse Communication Mobility status for transfers;Mobility status for ambulation  General  Behavior During Session [facility] for tasks performed  Cognition [facility] for tasks performed  PT - Assessment/Plan  Comments on Treatment Session Pt making good progress this AM.  Was able to ambulate ~60' with 2nd person following with recliner.  MD observed pt ambulating in hallway & states possible d/c today/tomorrow pending progress.  This clinician feels pt would benefit from another day in hospital to continue progression with mobility/strength before d/cing home.    PT Plan Discharge plan remains appropriate;Frequency remains appropriate  PT Frequency 7X/week  Follow Up Recommendations Home health PT  Equipment Recommended None recommended by PT  Acute Rehab PT Goals  PT Goal: Supine/Side to Sit - Progress Progressing toward goal  PT Goal: Sit to Stand - Progress Progressing toward goal  PT Goal: Stand to Sit - Progress Progressing toward goal  PT Goal: Ambulate - Progress Progressing toward goal  PT Goal: Perform Home Exercise Program - Progress Progressing toward goal     Verdell Face, [place] [phone] 09/24/2011

## 2011-09-24 NOTE — Progress Notes (Signed)
  Georgena Spurling, MD   Altamese Cabal, PA-C [address] [place], [place], [place]  [address]                             [phone]   PROGRESS NOTE  Subjective:  negative for Chest Pain  negative for Shortness of Breath  negative for Nausea/Vomiting   negative for Calf Pain  negative for Bowel Movement   Tolerating Diet: yes         Patient reports pain as 5 on 0-10 scale.    Objective: Vital signs in last 24 hours:   Patient Vitals for the past 24 hrs:  BP Temp Pulse Resp SpO2 Height Weight  09/24/11 0546 157/91 mmHg 99.4 F (37.4 C) 100  18  97 % - -  09/23/11 2130 133/78 mmHg 98.2 F (36.8 C) 96  18  99 % - -  09/23/11 2100 - - - - - 5\' 6"  (1.676 m) 115.985 kg (255 lb 11.2 oz)  09/23/11 1400 147/80 mmHg 98 F (36.7 C) 84  20  97 % - -    @flow {1959:LAST@   Intake/Output from previous day:   04/09 0701 - 04/10 0700 In: 1620 [P.O.:720; I.V.:900] Out: 200 [Urine:200]   Intake/Output this shift:   04/10 0701 - 04/10 1900 In: 360 [P.O.:360] Out: 750 [Urine:750]   Intake/Output      04/09 0701 - 04/10 0700 04/10 0701 - 04/11 0700   P.O. 720 360   I.V. (mL/kg) 900 (7.8)    IV Piggyback     Total Intake(mL/kg) 1620 (14) 360 (3.1)   Urine (mL/kg/hr) 200 (0.1) 750 (1.1)   Drains     Blood     Total Output 200 750   Net +1420 -390        Urine Occurrence 3 x       LABORATORY DATA:  Basename 09/24/11 0630 09/23/11 0620  WBC 8.8 8.5  HGB 9.7* 10.7*  HCT 28.2* 30.8*  PLT 194 213    Basename 09/24/11 0630 09/23/11 0620  NA 142 136  K 3.8 4.0  CL 108 102  CO2 26 25  BUN 14 19  CREATININE 0.79 0.96  GLUCOSE 106* 126*  CALCIUM 8.7 8.5   Lab Results  Component Value Date   INR 1.18 09/22/2011    Examination:  General appearance: alert, cooperative and no distress Extremities: Homans sign is negative, no sign of DVT  Wound Exam: clean, dry, intact   Drainage:  None: wound tissue dry  Motor Exam: EHL and FHL Intact  Sensory Exam: Deep Peroneal  normal  Vascular Exam:    Assessment:    2 Days Post-Op  Procedure(s) (LRB): TOTAL KNEE ARTHROPLASTY (Right)  ADDITIONAL DIAGNOSIS:  Active Problems:  * No active hospital problems. *   Acute Blood Loss Anemia   Plan: Physical Therapy as ordered Weight Bearing as Tolerated (WBAT)  DVT Prophylaxis:  Lovenox  DISCHARGE PLAN: Home tomorrow DISCHARGE NEEDS: HHPT, CPM, Walker and 3-in-1 comode seat         Jazier Mcglamery 09/24/2011, 12:41 PM

## 2011-09-25 LAB — BASIC METABOLIC PANEL
GFR calc Af Amer: >90 mL/min (ref >90)
GFR calc non Af Amer: >90 mL/min (ref >90)
Glucose, Bld: 102 mg/dL — ABNORMAL HIGH (ref 70–99)
Potassium: 3.4 mEq/L — ABNORMAL LOW (ref 3.5–5.1)
Sodium: 139 mEq/L (ref 135–145)

## 2011-09-25 LAB — CBC
Hemoglobin: 9.6 g/dL — ABNORMAL LOW (ref 12.0–15.0)
MCHC: 35.2 g/dL (ref 30.0–36.0)
Platelets: 204 10*3/uL (ref 150–400)
RDW: 13.4 % (ref 11.5–15.5)

## 2011-09-25 MED ORDER — METHOCARBAMOL 500 MG PO TABS: 500.0000 mg | ORAL_TABLET | Freq: Four times a day (QID) | ORAL | Status: AC | PRN

## 2011-09-25 MED ORDER — OXYCODONE HCL 10 MG PO TB12: 10.0000 mg | ORAL_TABLET | Freq: Two times a day (BID) | ORAL | Status: AC

## 2011-09-25 MED ORDER — ENOXAPARIN SODIUM 40 MG/0.4ML ~~LOC~~ SOLN: 40.0000 mg | SUBCUTANEOUS | Status: DC

## 2011-09-25 MED ORDER — OXYCODONE HCL 5 MG PO TABS: 5.0000 mg | ORAL_TABLET | ORAL | Status: AC | PRN

## 2011-09-25 NOTE — Progress Notes (Signed)
PT Progress Note:    09/25/11 1442  PT Visit Information  Last PT Received On 09/25/11  Precautions  Precautions Knee  Precaution Booklet Issued No  Restrictions  Weight Bearing Restrictions Yes  RLE Weight Bearing WBAT  Bed Mobility  Bed Mobility No  Transfers  Transfers Yes  Sit to Stand Other (comment);With armrests;From chair/3-in-1;With upper extremity assist (Min Guard (A))  Sit to Stand Details (indicate cue type and reason) Cues for initiation to increase ease of transfer  Stand to Sit Other (comment) (Min Guard (A))  Stand to Sit Details Continues to require cues for use of UE's to control descent  Ambulation/Gait  Ambulation/Gait Yes  Ambulation/Gait Assistance Other (comment) (Min Guard (A))  Ambulation/Gait Assistance Details (indicate cue type and reason) Continues to require cues for increased upright posture although improving.    Ambulation Distance (Feet) 120 Feet  Assistive device Rolling walker  Gait Pattern Step-to pattern;Decreased step length - left;Decreased step length - right;Decreased stance time - right;Decreased weight shift to right;Decreased hip/knee flexion - right  Gait velocity decreased  Stairs No  Posture/Postural Control  Posture/Postural Control No significant limitations  Balance  Balance Assessed No  PT - End of Session  Equipment Utilized During Treatment Gait belt  Activity Tolerance Patient tolerated treatment well  Patient left in chair;with call bell in reach  Nurse Communication Mobility status for transfers;Mobility status for ambulation  General  Behavior During Session [facility] for tasks performed  Cognition [facility] for tasks performed  PT - Assessment/Plan  PT Plan Discharge plan remains appropriate;Frequency remains appropriate  PT Frequency 7X/week  Follow Up Recommendations Home health PT  Equipment Recommended None recommended by PT  Acute Rehab PT Goals  PT Goal: Sit to Stand - Progress Progressing toward goal  PT Goal:  Stand to Sit - Progress Progressing toward goal  PT Goal: Ambulate - Progress Progressing toward goal       Verdell Face, PTA [phone] 09/25/2011

## 2011-09-25 NOTE — Progress Notes (Signed)
Utilization review completed. Kiante Petrovich, RN, BSN. 09/25/11  

## 2011-09-25 NOTE — Discharge Instructions (Signed)
Home Health to be provided by [facility] [phone]  Diet: As you were doing prior to hospitalization   Activity:  Increase activity slowly as tolerated                  No lifting or driving for 6 weeks  Shower:  May shower without a dressing once there is no drainage from your wound.                 Do NOT wash over the wound.  If drainage remains, cover wound with saran                  Wrap and then shower.  Clean incision with betadine and change dressing                        After saran wrap removed.  Dressing:  You may change your dressing on Friday                    Then change the dressing daily with sterile 4"x4"s gauze dressing                     And TED hose for knees.  Use paper tape to hold dressing in place                     For hips.  You may clean the incision with alcohol prior to redressing.  Weight Bearing:  Weight bearing as tolerated as taught in physical therapy.  Use a                                walker or Crutches as instructed.  To prevent constipation: you may use a stool softener such as -               Colace ( over the counter) 100 mg by mouth twice a day                Drink plenty of fluids ( prune juice may be helpful) and high fiber foods                Miralax ( over the counter) for constipation as needed.    Precautions:  If you experience chest pain or shortness of breath - call 911 immediately               For transfer to the hospital emergency department!!               If you develop a fever greater that 101 F, purulent drainage from wound,                             increased redness or drainage from wound, or calf pain -- Call the office at                                                 [phone].  Follow- Up Appointment:  Please call for an appointment to be seen on 10/07/11  [place] - [phone]                 Arthritis, Nonspecific Arthritis is inflammation of a  joint. This usually means pain, redness, warmth or swelling are present. One or more joints may be involved. There are a number of types of arthritis. Your caregiver may not be able to tell what type of arthritis you have right away. CAUSES  The most common cause of arthritis is the wear and tear on the joint (osteoarthritis). This causes damage to the cartilage, which can break down over time. The knees, hips, back and neck are most often affected by this type of arthritis. Other types of arthritis and common causes of joint pain include:  Sprains and other injuries near the joint. Sometimes minor sprains and injuries cause pain and swelling that develop hours later.   Rheumatoid arthritis. This affects hands, feet and knees. It usually affects both sides of your body at the same time. It is often associated with chronic ailments, fever, weight loss and general weakness.   Crystal arthritis. Gout and pseudo gout can cause occasional acute severe pain, redness and swelling in the foot, ankle, or knee.   Infectious arthritis. Bacteria can get into a joint through a break in overlying skin. This can cause infection of the joint. Bacteria and viruses can also spread through the blood and affect your joints.   Drug, infectious and allergy reactions. Sometimes joints can become mildly painful and slightly swollen with these types of illnesses.  SYMPTOMS   Pain is the main symptom.   Your joint or joints can also be red, swollen and warm or hot to the touch.   You may have a fever with certain types of arthritis, or even feel overall ill.   The joint with arthritis will hurt with movement. Stiffness is present with some types of arthritis.  DIAGNOSIS  Your caregiver will suspect arthritis based on your description of your symptoms and on your exam. Testing may be needed to find the type of arthritis:  Blood and sometimes urine tests.   X-ray tests and sometimes CT or MRI scans.   Removal of  fluid from the joint (arthrocentesis) is done to check for bacteria, crystals or other causes. Your caregiver (or a specialist) will numb the area over the joint with a local anesthetic, and use a needle to remove joint fluid for examination. This procedure is only minimally uncomfortable.   Even with these tests, your caregiver may not be able to tell what kind of arthritis you have. Consultation with a specialist (rheumatologist) may be helpful.  TREATMENT  Your caregiver will discuss with you treatment specific to your type of arthritis. If the specific type cannot be determined, then the following general recommendations may apply. Treatment of severe joint pain includes:  Rest.   Elevation.   Anti-inflammatory medication (for example, ibuprofen) may be prescribed. Avoiding activities that cause increased pain.   Only take over-the-counter or prescription medicines for pain and discomfort as recommended by your caregiver.   Cold packs over an inflamed joint may be used for 10 to 15 minutes every hour. Hot packs sometimes feel better, but do not use overnight. Do not use hot packs if you are diabetic without your caregiver's permission.   A cortisone shot into arthritic joints may help reduce pain and swelling.   Any acute arthritis that gets worse over the next 1 to 2 days needs to be looked at to be sure there  is no joint infection.  Long-term arthritis treatment involves modifying activities and lifestyle to reduce joint stress jarring. This can include weight loss. Also, exercise is needed to nourish the joint cartilage and remove waste. This helps keep the muscles around the joint strong. HOME CARE INSTRUCTIONS   Do not take aspirin to relieve pain if gout is suspected. This elevates uric acid levels.   Only take over-the-counter or prescription medicines for pain, discomfort or fever as directed by your caregiver.   Rest the joint as much as possible.   If your joint is swollen,  keep it elevated.   Use crutches if the painful joint is in your leg.   Drinking plenty of fluids may help for certain types of arthritis.   Follow your caregiver's dietary instructions.   Try low-impact exercise such as:   Swimming.   Water aerobics.   Biking.   Walking.   Morning stiffness is often relieved by a warm shower.   Put your joints through regular range-of-motion.  SEEK MEDICAL CARE IF:   You do not feel better in 24 hours or are getting worse.   You have side effects to medications, or are not getting better with treatment.  SEEK IMMEDIATE MEDICAL CARE IF: Wear and Tear Disorders of the Knee (Arthritis, Osteoarthritis) Everyone will experience wear and tear injuries (arthritis, osteoarthritis) of the knee. These are the changes we all get as we age. They come from the joint stress of daily living. The amount of cartilage damage in your knee and your symptoms determine if you need surgery. Mild problems require approximately two months recovery time. More severe problems take several months to recover. With mild problems, your surgeon may find worn and rough cartilage surfaces. With severe changes, your surgeon may find cartilage that has completely worn away and exposed the bone. Loose bodies of bone and cartilage, bone spurs (excess bone growth), and injuries to the menisci (cushions between the large bones of your leg) are also common. All of these problems can cause pain. For a mild wear and tear problem, rough cartilage may simply need to be shaved and smoothed. For more severe problems with areas of exposed bone, your surgeon may use an instrument for roughing up the bone surfaces to stimulate new cartilage growth. Loose bodies are usually removed. Torn menisci may be trimmed or repaired. ABOUT THE ARTHROSCOPIC PROCEDURE Arthroscopy is a surgical technique. It allows your orthopedic surgeon to diagnose and treat your knee injury with accuracy. The surgeon looks into  your knee through a small scope. The scope is like a small (pencil-sized) telescope. Arthroscopy is less invasive than open knee surgery. You can expect a more rapid recovery. After the procedure, you will be moved to a recovery area until most of the effects of the medication have worn off. Your caregiver will discuss the test results with you. RECOVERY The severity of the arthritis and the type of procedure performed will determine recovery time. Other important factors include age, physical condition, medical conditions, and the type of rehabilitation program. Strengthening your muscles after arthroscopy helps guarantee a better recovery. Follow your caregiver's instructions. Use crutches, rest, elevate, ice, and do knee exercises as instructed. Your caregivers will help you and instruct you with exercises and other physical therapy required to regain your mobility, muscle strength, and functioning following surgery. Only take over-the-counter or prescription medicines for pain, discomfort, or fever as directed by your caregiver.  SEEK MEDICAL CARE IF:   There is increased bleeding (  more than a small spot) from the wound.   You notice redness, swelling, or increasing pain in the wound.   Pus is coming from wound.   You develop an unexplained oral temperature above 102 F (38.9 C) , or as your caregiver suggests.   You notice a foul smell coming from the wound or dressing.   You have severe pain with motion of the knee.  SEEK IMMEDIATE MEDICAL CARE IF:   You develop a rash.   You have difficulty breathing.   You have any allergic problems.  MAKE SURE YOU:   Understand these instructions.   Will watch your condition.   Will get help right away if you are not doing well or get worse.  Document Released: 05/30/2000 Document Revised: 05/22/2011 Document Reviewed: 10/27/2007 [facility] Patient Information 2012 [place], [place].Knee Problems, Questions and Answers Knee problems are common  in young people and adults. This document contains general information about several knee problems. It includes:  Descriptions of the different parts of the knee.   Diagram of the different parts of the knee.  Individual sections describe specific types of knee injuries and their:   Symptoms.   Diagnosis.   Treatment.  Information on how to prevent these problems is also provided. WHAT DO THE KNEES DO? HOW DO THEY WORK? The knees provide stable support for the body. Knees allow the legs to bend and straighten. Flexibility and stability are needed for standing and for motions like:  Walking.   Jumping.   Running.   Turning.   Crouching.  Supporting and moving parts help the knees do their job, these parts include:  Bones.   Cartilage.   Muscles.   Ligaments.   Tendons.  Any of these parts can be involved in knee pain or a knee not working right (dysfunction). WHAT CAUSES KNEE PROBLEMS? There are two general kinds of knee problems: mechanical and inflammatory. Mechanical Knee Problems are problems that result from:  Injury, such as a direct blow or sudden movements that strain the knee beyond its normal range of movement.   Overuse, repetitive motions that produce partial fiber failure in tendon or ligaments.   Osteoarthritis in the knee, result from wear and tear on its parts.  Inflammatory Knee Problems are inflammation that occurs in certain rheumatic diseases, such as:   Rheumatoid arthritis.   Systemic lupus erythematosus.  JOINT BASICS  The point at which two or more bones are connected is called a joint.   In all joints, the bones are kept from grinding against each other by the tissue lining the ends of the bones called cartilage.   Bones are joined to bones by strong, elastic bands of tissue called ligaments.   Tendons are tough cords of tissue that connect muscle to bone.   Muscles work in opposing pairs to bend and straighten joints. While muscles  are not technically part of a joint, they are important because strong muscles help support and protect joints.  WHAT ARE THE PARTS OF THE KNEE? Like any joint, the knee is composed of bones and cartilage, ligaments, tendons, and muscles. BONES AND CARTILAGE The knee joint is the junction of four bones:   The thigh bone or upper leg bone (femur).   The shin bone or larger bone of the lower leg (tibia).   The small bone on the outside of the knee where ligaments attach (fibula).   The knee cap (patella). The patella is 2 to 3 inches wide and 3  to 4 inches long. It sits over the other bones at the front of the knee joint and slides when the leg moves. It protects the knee and gives leverage to muscles.  The ends of the bones in the knee joint are covered with articular cartilage, a tough, elastic material that helps absorb shock and allows the knee joint to move smoothly. Separating the bones of the knee are pads of connective tissue which are called meniscus. The plural is menisci. The menisci are divided into two crescent-shaped discs positioned between the tibia and femur on the outer and inner sides of each knee. The two menisci in each knee act as shock absorbers, cushioning the lower part of the leg from the weight of the rest of the body as well as enhancing stability. MUSCLES There are two groups of muscles at the knee.  The quadriceps muscle are four muscles on the front of the thigh that work to straighten the leg from a bent position.   The hamstring muscles, which bend the leg at the knee, run along the back of the thigh from the hip to just below the knee.  Keeping these muscles strong with exercises such as walking up stairs or riding a stationary bicycle helps support and protect the knee. TENDONS AND LIGAMENTS  The quadriceps tendon connects the quadriceps muscle to the patella and provides the power to extend the leg. The patella is a bone within this tendon. Four ligaments  connect the femur and tibia and give the joint strength and stability:   The medial collateral ligament (MCL) provides stability to the inner (medial) part of the knee.   The lateral collateral ligament (LCL) provides stability to the outer (lateral) part of the knee.   The anterior cruciate ligament (ACL), in the center of the knee, limits rotation and the forward movement of the tibia.   The posterior cruciate ligament (PCL), also in the center of the knee, limits backward movement of the tibia.   Other ligaments are part of the knee capsule. This is the protective, fiber-like structure that wraps around the knee joint. Inside the capsule, the joint is lined with a thin, soft tissue called synovium. This tissue produces the fluid (synovial fluid) which lubricates the joint.  HOW ARE KNEE PROBLEMS DIAGNOSED? Caregivers use several methods to diagnose knee problems:  Medical history--The patient tells the caregiver details about:   Symptoms.   Injuries.   Medical conditions.   Physical examination-- To help the caregiver understand how the knee is working, the patient may be asked to stand, walk or squat. The caregiver, to discover the limits of movement and the location of pain in the knee, may:   Bend the knee.   Straighten the knee.   Rotate (turn) turn the knee.   Press on the knee to feel for injury.   Diagnostic tests--The caregiver uses one or more stress tests to determine the nature of a knee problem.   X-ray (radiography)--An x-ray beam is passed through the knee to produce a two-dimensional picture of the bones.   Computerized axial tomography (CAT) scan--X-rays are passed through the knee at different angles, detected by a scanner, and analyzed by a computer. This produces a series of clear cross-sectional images ("slices") of the knee tissues on a computer screen. CAT scan images show details of bone structure, show soft tissues such as ligaments or muscles to a  limited degree, can give a three-dimensional view of the knee.   Bone scan (  radionuclide scanning)--A very small amount of radioactive material is injected into the patient's bloodstream and detected by a scanner. This test detects blood flow to the bone and cell activity within the bone and can show abnormalities. This may help the caregiver understand what is wrong.   Magnetic resonance imaging (MRI)--Energy from a powerful magnet (rather than x-rays) stimulates knee tissue to produce signals. These signals are detected by a scanner and analyzed by a computer. Like a CAT scan, a computer is used to produce three-dimensional views of the knee during MRI. The MRI provides precise details of ligament, tendon and cartilage structure.   Arthroscopy--The surgeon manipulates a small, lighted optic tube (arthroscope) that has been inserted into the joint through a small incision in the knee. Images of the inside of the knee joint are projected onto a television screen. While the arthroscope is inside the knee joint, removal of loose pieces of bone or cartilage, or the repair of torn ligaments and menisci can be preformed.   Biopsy--The caregiver removes tissue to examine under a microscope.   Aspiration of fluid from the knee--The laboratory will analyze the fluid for cell count, presence of crystals that produce inflammation (as in gout where Uric Acid crystals are the cause of the inflammation) and check for infection.  WHAT IS ARTHRITIS OF THE KNEE?   Arthritis of the knee is most often osteoarthritis. In this disease, the cartilage in the joint gradually wears away. It may be caused by excess stress on the joint from:   Trauma.   Deformity.   Repeated injury.   Excess weight.   It most often affects middle-aged and older people. A young person who develops osteoarthritis may have an inherited form of the disease or may have experienced continuous irritation from an unrepaired knee injury or  other injury.   In rheumatoid arthritis, which can also affect the knees, the joint becomes inflamed and cartilage may be destroyed. Rheumatoid arthritis often affects people at an earlier age than osteoarthritis and often involves multiple joints.   Arthritis can also affect supporting structures such as muscles, tendons, and ligaments.  WHAT ARE SIGNS OF ARTHRITIS OF THE KNEE?  Someone who has arthritis of the knee may experience:   Pain.   Swelling/ fluid on the knee.   A decrease in knee motion.   A common symptom is morning stiffness. This generally improves as the person moves around.   Sometimes the joint locks or clicks. These signs may occur in other knee disorders as well.   The caregiver may confirm the diagnosis by:   Performing a physical examination.   Examining x-rays, which typically show a loss of joint space.   Blood tests may be helpful for diagnosing rheumatoid arthritis, but other tests may be needed.   Analyzing fluid from the knee joint may be helpful in diagnosing some kinds of arthritis.   The caregiver may use arthroscopy to directly see damage to cartilage, tendons, and ligaments and to confirm a diagnosis. Arthroscopy is usually done only if a repair procedure is to be performed.  WHAT IS TREATMENT FOR ARTHRITIS OF THE KNEE?  Most often osteoarthritis of the knee is treated with pain-reducing medicines, such as:   Nonsteroidal anti-inflammatory drugs (NSAID's)   Exercises to restore joint movement and strengthen the knee.   Losing excess weight can also help people with osteoarthritis.   Rheumatoid arthritis of the knee may require physical therapy and more powerful medicines. In people with severe arthritis of  the knee, a seriously damaged joint may need to be replaced with an artificial one.  WHAT IS CHONDROMALACIA?  Chondromalacia refers to softening of the articular cartilage of the knee cap. This disorder occurs most often in young adults.  Instead of gliding smoothly across the lower end of the thigh bone, the knee cap rubs against it, thereby roughening the cartilage underneath the knee cap. The damage may range from a slightly abnormal surface of the cartilage to a surface that has been worn away to the bone. It can be caused by:   Injury.   Overuse.   Misalignment of the patellar tendon.   Muscle weakness (generally the quadriceps).   Chondromalacia related to injury occurs when a blow to the knee cap tears off either a small piece of cartilage or a large fragment containing a piece of bone.  WHAT ARE SYMPTOMS OF CHONDROMALACIA?  The most frequent symptom is a dull pain around or under the knee cap. This pain worsens when walking down stairs, or hills or sitting with the knee bent for long periods of time.   A person may also feel pain when climbing stairs or when the knee bears weight as it straightens.   The disorder is common in:   Runners.   Skiers.   Cyclists.   Soccer players.   A patient's description of symptoms, the physical exam, and a follow-up x-ray usually help the caregiver make a diagnosis.   Although arthroscopy can confirm the diagnosis. It is not used unless the condition requires extensive treatment.  WHAT IS TREATMENT FOR CHONDROMALACIA?  Many caregivers recommend that patients with chondromalacia perform low-impact exercises. The knee must not bend more than 90 degrees. This includes:   Swimming.   Riding a stationary bicycle.   Using a cross-country ski machine.   Electrical stimulation may also be used to strengthen the muscles.   If these treatments do not improve the condition, the caregiver may perform arthroscopic. Goals of this surgery include smoothing the surface of the cartilage and "washing out" the cartilage fragments that cause the joint to catch during bending and straightening.   In more severe cases, surgery may be necessary to:   Correct the alignment of the knee  cap.   Decrease the pressure on the undersurface of the patella.   Relieve friction with the cartilage.   Reposition parts that are out of alignment.  WHAT CAUSES INJURIES TO THE MENISCUS? The meniscus is easily injured by the force of rotating the knee while bearing weight. A partial or total tear may occur when a person quickly twists or rotates the upper leg while the foot stays still. For example, when dribbling a basketball around an opponent or turning to hit a tennis ball. If the tear is tiny, the meniscus stays connected to the front and back of the knee. If the tear is large, the meniscus may be left in an abnormally mobile position which produces instability. The seriousness of a tear depends on its location and extent. WHAT ARE SYMPTOMS OF INJURIES TO THE MENISCUS?  Pain, particularly when the knee is straightened.   If the pain is mild, the patient may continue with normal activity.   Severe pain may occur if a fragment of the meniscus catches between the femur and the tibia.   Swelling may occur soon after injury if blood vessels are disrupted. Swelling may occur several hours later if the joint fills with fluid produced by the joint lining (synovium) as a  result of inflammation. If the synovium is injured, it may become inflamed and produce fluid. This makes the knee swell.   Sometimes, an injury that occurred in the past but was not treated becomes painful months or years later.   After any injury, the knee may click, lock, feel weak, or give way without warning.   Although symptoms of meniscal injury may disappear on their own (particularly with a stable meniscal tear), they frequently persist or return and require treatment.  HOW IS INJURY TO THE MENISCUS DIAGNOSED?  The caregiver will listen to the patient's description of the pain and swelling. The caregiver will perform a physical examination and take x-rays of the knee. The examination may include a test in which the  caregiver bends the leg, and then rotates the leg outward and inward while extending it. Pain along the joint line or an audible click suggests a meniscal tear.   An MRI may be done.   Occasionally, the caregiver may use arthroscopy without obtaining the MRI to diagnose and treat a meniscal tear.  WHAT IS TREATMENT FOR INJURY TO THE MENISCUS?  The caregiver may recommend a muscle-strengthening program if:   The tear is minor.   The pain and symptoms are improving.   Exercises for meniscal problems are best started with guidance from a caregiver and physical therapist or athletic trainer. The therapist will make sure that the patient does the exercises properly and without risking new or repeat injury. The following exercises after injury to the meniscus are designed to build up the quadriceps and hamstring muscles and increase flexibility and strength.   Warming up the joint by riding a stationary bicycle, then straightening and raising the leg (but not straightening it too much).   Extending the leg while sitting (a weight may be worn on the ankle for this exercise).   Raising the leg while lying on the stomach.   Exercising in a pool (walking as fast as possible in chest-deep water, performing small flutter kicks while holding onto the side of the pool, and raising each leg to 90 in chest-deep water while pressing the back against the side of the pool).   If the tear is more extensive, the caregiver may perform arthroscopic with or without open surgery to see the extent of injury and to repair the tear. The caregiver can sew the meniscus back in place if the patient is relatively young, if the injury is in an area with a good blood supply, and if the ligaments are intact. Most young athletes are able to return to active sports after meniscus repair.   If the patient is elderly or the tear is in an area with a poor blood supply, the caregiver may trim a small portion of the meniscus to  even the surface. In rare cases, the caregiver removes the entire meniscus. Osteoarthritis is more likely to develop in the knee if the entire meniscus is removed.   Recovery after surgical repair takes several weeks to months. Activity after surgery is slightly more restricted than when the meniscus is partially removed. However, putting weight on the joint actually helps recovery. Regardless of the form of surgery, rehabilitation usually includes:   Walking.   Bending the legs.   Doing exercises that stretch and build up leg muscles.   The best results of treatment for meniscal injury are obtained in people who:   Do not have articular cartilage changes.   Have an intact ACL.  LIGAMENT INJURIES WHAT  ARE THE CAUSES OF ANTERIOR AND POSTERIOR CRUCIATE LIGAMENT INJURIES?  Injury to the cruciate ligaments is sometimes referred to as a "sprain".   The ACL is most often stretched or torn (or both) by a sudden twisting or pushing the ACL beyond its normal range. For example, when the feet are planted one way and the knee rotates in the opposite direction.   The PCL is most often injured by a direct impact, such as in an automobile accident or football tackle.  WHAT ARE SYMPTOMS?  Injury to a cruciate ligament may not cause pain. Symptoms may include:   A popping sound   Buckling when trying to stand on the leg.   The caregiver will perform several physical exam tests. These tests are to see whether the parts of the knee stay in proper position when pressure is applied in different directions.   A thorough examination is essential. An MRI is very accurate in detecting a complete tear. Arthroscopy may be the only reliable means of detecting a partial one.  TREATMENT  For an incomplete tear, the caregiver may recommend that the patient begin an exercise program to strengthen surrounding muscles.   The caregiver may also prescribe a brace to protect the knee during activity.   For a  completely torn ACL in an active athlete and motivated person, the caregiver is likely to recommend surgery. The surgeon may reconstruct the torn ligament by using:   A piece (graft) of healthy ligament from the patient (autograft)   A piece of ligament from a tissue bank (allograft). One of the most important elements in a patient's successful recovery after cruciate ligament surgery is a 4- to 54-month exercise program. This program may involve using special exercise equipment at a rehabilitation or sports center. Successful surgery special exercises will allow the patient to return to a normal, active lifestyle.  MEDIAL AND LATERAL COLLATERAL LIGAMENT INJURIES WHAT IS THE MOST COMMON CAUSE OF MEDIAL AND LATERAL COLLATERAL LIGAMENT INJURIES? The MCL is more commonly injured than the LCL. The cause is most often a blow to the outer side of the knee. This injury stretches and tears the ligament on the inner side of the knee. Such blows frequently occur in contact sports like football or hockey. SYMPTOMS AND DIAGNOSIS  When injury to the MCL occurs, a person may feel a pop and the knee may buckle sideways.   Pain and swelling are common.   A thorough exam is needed to determine the kind and extent of the injury.   To diagnose a collateral ligament injury, the caregiver exerts pressure on the side of the knee to determine the degree of pain and the looseness of the joint.   An MRI is helpful in diagnosing injuries to these ligaments.  TREATMENT  Most sprains of the collateral ligaments will heal if the patient follows a prescribed exercise program.   In addition to exercise, the caregiver may recommend ice packs to reduce pain and swelling and a small sleeve-type brace to protect and stabilize the knee.   A sprain may take 4 to 6 weeks to heal.   A patient with a severely sprained or torn collateral ligament may also have a torn ACL. This usually requires surgical repair.  TENDON INJURIES  AND DISORDERS WHAT CAUSES TENDINITIS AND RUPTURED TENDONS?  Knee tendon injuries range from tendinitis to a torn (ruptured) tendon.   If a person overuses a tendon during certain activities such as dancing, cycling, or running, the tendon  stretches like a worn-out rubber band and becomes inflamed.   Also, trying to break a fall may cause the quadriceps muscles to contract and tear the quadriceps tendon above the patella or the patellar tendon below the patella. This type of injury is most likely to happen in older people.   Tendinitis of the patellar tendon is sometimes called jumper's knee because in sports that require jumping, such as basketball or volleyball, the muscle contraction and force of hitting the ground after a jump strain the tendon.   After repeated stress, the tendon may become inflamed or tear.  SYMPTOMS AND DIAGNOSIS  People with tendinitis often have tenderness at the point where the patellar tendon meets the bone. In addition, they may feel pain during running, fast walking, or jumping.   A complete rupture of the quadriceps or patellar tendon is painful. It also makes it difficult for a person to bend, extend, or lift the leg against gravity.   If there is not much swelling, the caregiver may be able to feel a defect in the tendon near the tear during a physical examination.   An x-ray will show that the patella is lower than normal in a quadriceps tendon tear and higher than normal in a patellar tendon tear. The caregiver may use an MRI to confirm a partial or total tear.  TREATMENT  Initially, the caregiver may ask a patient with tendinitis to rest, elevate, and apply ice to the knee and to take medicines to relieve pain and decrease inflammation and swelling.   If the quadriceps or patellar tendon is completely ruptured, a surgeon will reattach the ends. After surgery, the patient will wear a cast or brace for 3 to 6 weeks and use crutches.   For a partial tear,  the caregiver might apply a cast or an extension knee brace without performing surgery.   Rehabilitating a partial or complete tear of a tendon requires an exercise program that is similar to but less forceful than that used for ligament injuries. The goals of exercise are to restore the ability to bend and straighten the knee and to strengthen the leg to prevent repeat injury. A rehabilitation program may last 4 to 6 months. A patient can return to many activities before then.  OSGOOD-SCHLATTER DISEASE WHAT CAUSES OSGOOD-SCHLATTER DISEASE?  Osgood-Schlatter disease is caused by repetitive stress or tension on part of the growth area of the upper tibia (the apophysis). Symptoms included inflammation of the patellar tendon and surrounding soft tissues at the point where the tendon attaches to the tibia.   The disease may also be associated with an injury in which the tendon is stretched so much that it tears away from the tibia and takes a fragment of bone with it.   The disease generally affects active young people. Particularly boys between the ages of 55 and 51, who play games or sports that include frequent running and jumping and who have open growth plates.  SYMPTOMS AND DIAGNOSIS  People with this disease experience pain just below the knee joint. This pain usually worsens with activity and is relieved by rest.   The bony bump that is particularly painful when pressed may increase in size at the upper edge of the tibia (below the knee cap).   Usually motion of the knee is not affected.   Pain may last a few months and may come back with periods of high activity until the child's growth is completed.   Osgood-Schlatter disease is  most often diagnosed by the symptoms and the physical exam. An x-ray may be normal, or show an injury. An x-ray, more typically, will show that the growth area is fragmented.  TREATMENT  Usually, the disease goes away without aggressive treatment.   Applying  ice to the knee when pain begins helps relieve inflammation. Applying ice is sometimes used along with stretching and strengthening exercises.   The caregiver may advise the patient to limit participation in vigorous sports. Children who wish to continue less stressful sports activities may need to wear knee pads for protection and apply ice to the knee after activity. If there is a great deal of pain, sports activities may be limited until discomfort becomes tolerable.  ILIOTIBIAL BAND SYNDROME WHAT CAUSES ILIOTIBIAL BAND SYNDROME? This is an overuse condition in which inflammation results when a band of a tendon rubs over the outer bone of the knee. Although iliotibial band syndrome may be caused by direct injury to the knee, it is most often caused by the stress of long-term overuse. SYMPTOMS AND DIAGNOSIS  A person with this syndrome feels an ache or burning sensation at the outside of the knee during activity. Pain may be localized at the outside of the knee or radiate up the side of the thigh.   A person may also feel a snap when the knee is bent and then straightened.   Swelling may be absent and knee motion is normal.   The diagnosis of this disorder is typically based on the symptoms. Symptoms include pain at the outer side of the knee. Other problems with similar symptoms must also be excluded.  TREATMENT  Usually, the problem disappears if the person reduces activity and performs stretching exercises followed by muscle-strengthening exercises.   In rare cases when the syndrome does not disappear, surgery may be necessary to split the tendon so it is not stretched too tightly over the bone.  OTHER KNEE INJURIES WHAT IS OSTEOCHONDRITIS DISSECANS?  Osteochondritis dissecans results from a loss of the blood supply to an area of bone at the joint surface and usually involves the knee. The affected bone and its covering of cartilage gradually loosen and cause pain.   This problem  usually arises in an active adolescent or young adult. It may be due to a slight blockage of a small artery or to an unrecognized injury or tiny fracture that damages the overlying cartilage.   Lack of a blood supply can cause bone to break down (avascular necrosis).   The involvement of several joints or the appearance of the condition in several family members may indicate that the disorder is inherited.   A person with this condition may eventually develop osteoarthritis.  SYMPTOMS AND DIAGNOSIS  If normal healing does not occur, cartilage separates from the diseased bone and a fragment breaks loose into the knee joint. This causes weakness, sharp pain, and locking of the joint.   An x-ray, MRI, or arthroscopy can determine the condition of the cartilage and can be used to diagnose osteochondritis dissecans.  TREATMENT  If cartilage fragments have not broken loose, a surgeon may fix the cartilage and underlying bone in place with pins or screws. These pins or screws are sunk into the cartilage to stimulate a new blood supply.   If fragments are loose, the surgeon may scrape down the cavity to reach fresh bone and add a bone graft and fix the fragments in position. Fragments that cannot be mended are removed, and the cavity  is drilled or scraped to stimulate new cartilage growth.   Research is being done to assess the use of cartilage cell implants and other tissue transplants to treat this disorder.  WHAT IS PLICA SYNDROME?  Plica syndrome occurs when plicae (bands of synovial tissue) are irritated by overuse or injury.   Synovial plicae are the remains of tissue pouches found in the early stages of fetal development. As the fetus develops, these pouches normally combine to form one large synovial cavity. If this process is incomplete, plicae remain as folds or bands of synovial tissue within the knee.   Injury, chronic overuse, or inflammatory conditions are associated with this  syndrome.  SYMPTOMS AND DIAGNOSIS  People with this syndrome are likely to experience pain and swelling, a clicking sensation, and locking and weakness of the knee.   Because the symptoms are similar to those of some other knee problems, plica syndrome is often misdiagnosed. Diagnosis usually depends on excluding other conditions that cause similar symptoms.  TREATMENT  The goal of treatment is to reduce inflammation of the synovium and thickening of the plicae.   The caregiver usually prescribes medicine to reduce inflammation.   The patient is also advised to reduce activity, apply ice and an elastic bandage to the knee, and do strengthening exercises.   A cortisone injection into the plica folds helps about half of those treated.   If treatment fails to relieve symptoms within 3 months, the caregiver may recommend arthroscopic or open surgery to remove the plicae.  WHAT KINDS OF CAREGIVERS TREAT KNEE PROBLEMS?  Extensive injuries and diseases of the knees are usually treated by an orthopedic surgeon, a doctor who has been trained in the nonsurgical and surgical treatment of bones, joints, and soft tissues such as ligaments, tendons, and muscles.   Patients seeking nonsurgical treatment of arthritis of the knee may also consult a rheumatologist. This is a caregiver specializing in the diagnosis and treatment of arthritis and related disorders.  HOW CAN PEOPLE PREVENT KNEE PROBLEMS? Some knee problems cannot be foreseen or prevented. However, a person can prevent many knee problems by following these suggestions:  Before exercising or playing sports, warm up by walking or riding a stationary bicycle, and then do stretches. Stretching the muscles in the front of the thigh (quadriceps) and back of the thigh (hamstrings) reduces tension on the tendons. Stretching also relieves pressure on the knee during activity.   Strengthen the leg muscles by doing specific exercises (for example, by  walking up stairs or hills, or by riding a stationary bicycle). A supervised workout with weights is another way to strengthen the leg muscles that support the knee.   Avoid sudden changes in the intensity of exercise. Increase the force or duration of activity gradually.   Wear shoes that both fit properly and are in good condition. Good shoes will help maintain balance and leg alignment when walking or running. Knee problems can be caused by flat feet or feet that roll inward. People can often reduce some of these problems by wearing special shoe inserts (orthotics).   Maintain a healthy weight to reduce stress on the knee. Obesity increases the risk of degenerative (wearing) conditions such as osteoarthritis of the knee.  WHAT TYPES OF EXERCISE ARE MOST SUITABLE FOR SOMEONE WITH KNEE PROBLEMS? Three types of exercise are best for people with arthritis:  Range-of-motion exercises help maintain normal joint movement and relieve stiffness. This type of exercise helps maintain or increase flexibility.   Strengthening  exercises help maintain or increase muscle strength. Strong muscles help support and protect joints affected by arthritis.   Aerobic or endurance exercises improve function of the heart and circulation and help control weight. Weight control can be important to people who have arthritis because extra weight puts pressure on many joints. Some studies show that aerobic exercise can reduce inflammation in some joints.  WHERE CAN PEOPLE FIND MORE INFORMATION ABOUT KNEE PROBLEMS? [employer] Arthritis and Musculoskeletal and Skin: www.niams.SelfMillionaire.nl American Academy of Orthopedic Surgeons: www.aaos.org [employer] of Rheumatology: www.rheumatology.org American Physical Therapy Association: www.apta.org Arthritis Foundation: www.arthritis.org Document Released: 06/05/2003 Document Revised: 05/22/2011 Document Reviewed: 09/20/2008 [facility] Patient Information 2012  [place], [place].  You have a fever.   You develop severe joint pain, swelling or redness.   Many joints are involved and become painful and swollen.   There is severe back pain and/or leg weakness.   You have loss of bowel or bladder control.  Document Released: 07/10/2004 Document Revised: 05/22/2011 Document Reviewed: 07/26/2008 [facility] Patient Information 2012 [place], [place].

## 2011-09-25 NOTE — Progress Notes (Signed)
PT Progress Note:     09/25/11 1000  PT Visit Information  Last PT Received On 09/25/11  Precautions  Precautions Knee  Precaution Booklet Issued No  Restrictions  Weight Bearing Restrictions Yes  RLE Weight Bearing WBAT  Bed Mobility  Bed Mobility Yes  Supine to Sit 4: Min assist;HOB flat  Supine to Sit Details (indicate cue type and reason) pt=90% of transition.  (A) needed only for RLE.   Transfers  Transfers Yes  Sit to Stand 2: Max assist;4: Min assist;From chair/3-in-1;From bed;With armrests;With upper extremity assist  Sit to Stand Details (indicate cue type and reason) Pt required max (A) to achieve standing from bed on 1st & 2nd trial but able to progress to Min (A) to stand from bed & chair.  Performed transfer 6x's (3 from bed; 3 from chair).     Stand to Sit Other (comment) (Min Guard (A))  Stand to Sit Details Cues for use of UE's to ensure controlled descent.  Much better today.    Stand Pivot Transfers Other (comment) (Min Guard (A))  Print production planner Details (indicate cue type and reason) Close guarding for safety.    Ambulation/Gait  Ambulation/Gait Yes  Ambulation/Gait Assistance Other (comment) (Min Guard (A))  Ambulation/Gait Assistance Details (indicate cue type and reason) Guarding for safety.  Continues to require ocassional cueing for increased upright posture but improvement noted.  Pt with decreased step length today & reports she is taking smaller steps to work on upright posture. Still relies heavily on RW with UE's but encouraged pt to attempt decreasing reliance of UE's.      Ambulation Distance (Feet) 50 Feet  Assistive device Rolling walker  Gait Pattern Step-to pattern;Decreased step length - left;Decreased step length - right;Decreased stance time - right;Decreased weight shift to right;Decreased hip/knee flexion - right  Stairs Yes  Stairs Assistance Other (comment) (Min Guard (A))  Stairs Assistance Details (indicate cue type and reason)  Verbal & visual cues for technique.  Pt practiced forwards & backwards technique.  Pt prefers backwards.    Stair Management Technique No rails;Backwards;Forwards;With walker  Number of Stairs 1  (3x's. )  Wheelchair Mobility  Wheelchair Mobility No  Posture/Postural Control  Posture/Postural Control No significant limitations  Balance  Balance Assessed No  Exercises  Exercises Total Joint  Total Joint Exercises  Ankle Circles/Pumps AROM;15 reps;Supine  Quad Sets AROM;Strengthening;Both;15 reps;Supine  Knee Flexion PROM;Right;10 reps;Seated  PT - End of Session  Equipment Utilized During Treatment Gait belt  Activity Tolerance Patient tolerated treatment well  Patient left in chair;with call bell in reach  Nurse Communication Mobility status for transfers;Mobility status for ambulation  General  Behavior During Session [facility] for tasks performed  [facility]ognition [facility] for tasks performed  PT - Assessment/Plan  Comments on Treatment Session Focus of session was sit<>stand transfers & stair training.  Pt improving with sit<>stands today requiring decreased (A).    PT Plan Discharge plan remains appropriate;Frequency remains appropriate  PT Frequency 7X/week  Follow Up Recommendations Home health PT  Equipment Recommended None recommended by PT  Acute Rehab PT Goals  PT Goal: Supine/Side to Sit - Progress Not met  PT Goal: Sit to Stand - Progress Progressing toward goal  PT Goal: Stand to Sit - Progress Progressing toward goal  PT Transfer Goal: Bed to Chair/Chair to Bed - Progress Progressing toward goal  PT Goal: Ambulate - Progress Progressing toward goal  PT Goal: Up/Down Stairs - Progress Progressing toward goal  Briana Miller, [place] [phone] 09/25/2011

## 2011-09-25 NOTE — Progress Notes (Signed)
Assessment complete, see for details. Patient sitting on edge of bed. Physical therapy at bedside. No acute distress noted. Patient denies any needs, concerns.

## 2011-09-25 NOTE — Progress Notes (Signed)
  Georgena Spurling, MD   Altamese Cabal, PA-C [address] [place], [place], [place]  [address]                             [phone]   PROGRESS NOTE  Subjective:  negative for Chest Pain  negative for Shortness of Breath  negative for Nausea/Vomiting   negative for Calf Pain  negative for Bowel Movement   Tolerating Diet: yes         Patient reports pain as 5 on 0-10 scale.    Objective: Vital signs in last 24 hours:   Patient Vitals for the past 24 hrs:  BP Temp Temp src Pulse Resp SpO2  09/25/11 0643 146/82 mmHg 97.9 F (36.6 C) - 80  18  97 %  09/24/11 2107 129/84 mmHg 98.3 F (36.8 C) Oral 87  18  100 %  09/24/11 1400 142/75 mmHg 98.2 F (36.8 C) - 86  16  99 %    @flow {1959:LAST@   Intake/Output from previous day:   04/10 0701 - 04/11 0700 In: 840 [P.O.:840] Out: 1150 [Urine:1150]   Intake/Output this shift:       Intake/Output      04/10 0701 - 04/11 0700 04/11 0701 - 04/12 0700   P.O. 840    I.V. (mL/kg)     Total Intake(mL/kg) 840 (7.2)    Urine (mL/kg/hr) 1150 (0.4)    Total Output 1150    Net -310         Urine Occurrence 2 x       LABORATORY DATA:  Basename 09/25/11 0620 09/24/11 0630 09/23/11 0620  WBC 7.9 8.8 8.5  HGB 9.6* 9.7* 10.7*  HCT 27.3* 28.2* 30.8*  PLT 204 194 213    Basename 09/25/11 0620 09/24/11 0630 09/23/11 0620  NA 139 142 136  K 3.4* 3.8 4.0  CL 104 108 102  CO2 26 26 25   BUN 12 14 19   CREATININE 0.75 0.79 0.96  GLUCOSE 102* 106* 126*  CALCIUM 8.7 8.7 8.5   Lab Results  Component Value Date   INR 1.18 09/22/2011    Examination:  General appearance: alert, cooperative and no distress Extremities: Homans sign is negative, no sign of DVT  Wound Exam: clean, dry, intact   Drainage:  None: wound tissue dry  Motor Exam: EHL and FHL Intact  Sensory Exam: Deep Peroneal normal  Vascular Exam:    Assessment:    3 Days Post-Op  Procedure(s) (LRB): TOTAL KNEE ARTHROPLASTY (Right)  ADDITIONAL DIAGNOSIS:    Active Problems:  * No active hospital problems. *   Acute Blood Loss Anemia   Plan: Physical Therapy as ordered Weight Bearing as Tolerated (WBAT)  DVT Prophylaxis:  Lovenox  DISCHARGE PLAN: Home  DISCHARGE NEEDS: HHPT, CPM, Walker and 3-in-1 comode seat         Liseth Wann 09/25/2011, 11:59 AM

## 2011-09-25 NOTE — Progress Notes (Addendum)
**  Late Note entered for 09/24/11**  PT Progress Note:    09/24/11 1500  PT Visit Information  Last PT Received On 09/24/11  Precautions  Precautions Knee  Precaution Booklet Issued No  Restrictions  Weight Bearing Restrictions Yes  RLE Weight Bearing WBAT  Bed Mobility  Bed Mobility Yes  Supine to Sit 4: Min assist;HOB flat  Supine to Sit Details (indicate cue type and reason) Cues for technique.  (A) for RLE.   Transfers  Transfers Yes  Sit to Stand 1: +2 Total assist;From bed;From chair/3-in-1;With armrests;With upper extremity assist  Sit to Stand Details (indicate cue type and reason) pt=60%.  (A) to achieve standing, balance, anterior translation of trunk, tactile cues for upright posture.  Cues for initiation, sequencing, technique, use of UE's & to push through both UE's evenly.     Stand to Sit With armrests;With upper extremity assist;3: Mod assist;To chair/3-in-1  Stand to Sit Details (A) to control descent & safety.  Max cues for use of UE's to control descent.    Ambulation/Gait  Ambulation/Gait Yes  Ambulation/Gait Assistance 4: Min assist (2nd person to follow with recliner)  Ambulation/Gait Assistance Details (indicate cue type and reason) Cues for sequencing, upright posture, RW advancement.  At one point pt attempting step-through pattern & increased LOB forwards requiring increased (A) to correct.    Ambulation Distance (Feet) 75 Feet  Assistive device Rolling walker  Gait Pattern Step-to pattern;Decreased hip/knee flexion - right;Decreased stance time - right;Decreased step length - left  Stairs No  Wheelchair Mobility  Wheelchair Mobility No  Posture/Postural Control  Posture/Postural Control No significant limitations  Balance  Balance Assessed No  Exercises  Exercises Total Joint  Total Joint Exercises  Quad Sets AROM;Strengthening;10 reps;Seated  Long Arc Quad AROM;Strengthening;10 reps;Seated  Marching in Standing AROM;Strengthening;Both;10  reps;Standing  PT - End of Session  Equipment Utilized During Treatment Gait belt  Activity Tolerance Patient tolerated treatment well  Patient left in chair;with call bell in reach  Nurse Communication Mobility status for transfers;Mobility status for ambulation  General  Behavior During Session [facility] for tasks performed  Cognition [facility] for tasks performed  PT - Assessment/Plan  Comments on Treatment Session pt continues to require +2 total (A) to achieve standing.  Performed sit<>stand 4x's for strengthening, technique, increasing independence.    PT Plan Discharge plan remains appropriate;Frequency remains appropriate  PT Frequency 7X/week  Follow Up Recommendations Home health PT  Equipment Recommended None recommended by PT  Acute Rehab PT Goals  PT Goal: Supine/Side to Sit - Progress Progressing toward goal  PT Goal: Sit to Stand - Progress Not met  PT Goal: Stand to Sit - Progress Progressing toward goal  PT Goal: Ambulate - Progress Progressing toward goal      Verdell Face, [place] [phone] 09/25/2011

## 2011-09-25 NOTE — Op Note (Signed)
TOTAL KNEE REPLACEMENT OPERATIVE NOTE:  09/22/2011  1:29 PM  PATIENT:  Briana Miller  56 y.o. female  PRE-OPERATIVE DIAGNOSIS:  osteoarthritis right knee  POST-OPERATIVE DIAGNOSIS:  osteoarthritis right knee  PROCEDURE:  Procedure(s): TOTAL KNEE ARTHROPLASTY  SURGEON:  Surgeon(s): Raymon Mutton, MD  PHYSICIAN ASSISTANT: Altamese Cabal, [facility]  ANESTHESIA:   general  DRAINS: Hemovac and On-Q Marcaine Pain Pump  SPECIMEN: None  COUNTS:  Correct  TOURNIQUET:   Total Tourniquet Time Documented: Thigh (Right) - 60 minutes  DICTATION:  Indication for procedure:    The patient is a 56 y.o. female who has failed conservative treatment for osteoarthritis right knee.  Informed consent was obtained prior to anesthesia. The risks versus benefits of the operation were explain and in a way the patient can, and did, understand.   Description of procedure:     The patient was taken to the operating room and placed under anesthesia.  The patient was positioned in the usual fashion taking care that all body parts were adequately padded and/or protected.  I foley catheter was placed.  A tourniquet was applied and the leg prepped and draped in the usual sterile fashion.  The extremity was exsanguinated with the esmarch and tourniquet inflated to 350 mmHg.  Pre-operative range of motion was normal.  The knee was in 7 degree of significant valgus.  A midline incision approximately 6-7 inches long was made with a #10 blade.  A new blade was used to make a parapatellar arthrotomy going 2-3 cm into the quadriceps tendon, over the patella, and alongside the medial aspect of the patellar tendon.  A synovectomy was then performed with the #10 blade and forceps. I then elevated the deep MCL off the medial tibial metaphysis subperiosteally around to the semimembranosus attachment.    I everted the patella and used calipers to measure patellar thickness.  I used the reamer to ream down to appropriate  thickness to recreate the native thickness.  I then removed excess bone with the rongeur and sagittal saw.  I used the appropriately sized template and drilled the three lug holes.  I then put the trial in place and measured the thickness with the calipers to ensure recreation of the native thickness.  The trial was then removed and the patella subluxed and the knee brought into flexion.  A homan retractor was place to retract and protect the patella and lateral structures.  A Z-retractor was place medially to protect the medial structures.  The extra-medullary alignment system was used to make cut the tibial articular surface perpendicular to the anamotic axis of the tibia and in 3 degrees of posterior slope.  The cut surface and alignment jig was removed.  I then used the intramedullary alignment guide to make a 4 valgus cut on the distal femur.  I then marked out the epicondylar axis on the distal femur.  The posterior condylar axis measured 5 degrees.  I then used the anterior referencing sizer and measured the femur to be a size D.  The 4-In-1 cutting block was screwed into place in external rotation matching the posterior condylar angle, making our cuts perpendicular to the epicondylar axis.  Anterior, posterior and chamfer cuts were made with the sagittal saw.  The cutting block and cut pieces were removed.  A lamina spreader was placed in 90 degrees of flexion.  The ACL, PCL, menisci, and posterior condylar osteophytes were removed.  A 10 mm spacer blocked was found to offer good flexion and  extension gap balance after severe in degree releasing.   The scoop retractor was then placed and the femoral finishing block was pinned in place.  The small sagittal saw was used as well as the lug drill to finish the femur.  The block and cut surfaces were removed and the medullary canal hole filled with autograft bone from the cut pieces.  The tibia was delivered forward in deep flexion and external rotation.   A size 4 tray was selected and pinned into place centered on the medial 1/3 of the tibial tubercle.  The reamer and keel was used to prepare the tibia through the tray.    I then trialed with the size D femur, size 4 tibia, a 10 mm insert and the 32 patella.  I had excellent flexion/extension gap balance, excellent patella tracking.  Flexion was full and beyond 120 degrees; extension was zero.  These components were chosen and the staff opened them to me on the back table while the knee was lavaged copiously and the cement mixed.  I cemented in the components and removed all excess cement.  The polyethylene tibial component was snapped into place and the knee placed in extension while cement was hardening.  The capsule was infilltrated with 20cc of .25% Marcaine with epinephrine.  A hemovac was place in the joint exiting superolaterally.  A pain pump was place superomedially superficial to the arthrotomy.  Once the cement was hard, the tourniquet was let down.  Hemostasis was obtained.  The arthrotomy was closed with figure-8 #1 vicryl sutures.  The deep soft tissues were closed with #0 vicryls and the subcuticular layer closed with a running #2-0 vicryl.  The skin was reapproximated and closed with skin staples.  The wound was dressed with xeroform, 4 x4's, 2 ABD sponges, a single layer of webril and a TED stocking.   The patient was then awakened, extubated, and taken to the recovery room in stable condition.  BLOOD LOSS:  300cc DRAINS: 1 hemovac, 1 pain catheter COMPLICATIONS:  None.  PLAN OF CARE: Admit to inpatient   PATIENT DISPOSITION:  PACU - hemodynamically stable.   Delay start of Pharmacological VTE agent (>24hrs) due to surgical blood loss or risk of bleeding:  not applicable  Please fax a copy of this op note to my office at [phone] (please only include page 1 and 2 of the Case Information op note)

## 2011-09-25 NOTE — Progress Notes (Signed)
Discharge instructions and prescriptions given to patient. Patient verbalized understanding. IV discontinued, patient tolerated well. Daughter at bedside. Awaiting transporter for escort to ride.

## 2011-09-25 NOTE — Discharge Summary (Signed)
PATIENT ID:      Briana Miller  MRN:     [id number] DOB/AGE:    [birth year] / 56 y.o.     DISCHARGE SUMMARY  ADMISSION DATE:    09/22/2011 DISCHARGE DATE:   09/25/2011   ADMISSION DIAGNOSIS: osteoarthritis right knee  (osteoarthritis right knee)  DISCHARGE DIAGNOSIS:  osteoarthritis right knee    ADDITIONAL DIAGNOSIS: Active Problems:  * No active hospital problems. *   Past Medical History  Diagnosis Date  . Arthritis     PROCEDURE: Procedure(s): TOTAL KNEE ARTHROPLASTY on 09/22/2011  CONSULTS:     HISTORY:  See H&P in chart  HOSPITAL COURSE:  Briana Miller is a 56 y.o. admitted on 09/22/2011 and found to have a diagnosis of osteoarthritis right knee.  After appropriate laboratory studies were obtained  they were taken to the operating room on 09/22/2011 and underwent Procedure(s): TOTAL KNEE ARTHROPLASTY.   They were given perioperative antibiotics:  Anti-infectives     Start     Dose/Rate Route Frequency Ordered Stop   09/22/11 1630   ceFAZolin (ANCEF) IVPB 2 g/50 mL premix        2 g 100 mL/hr over 30 Minutes Intravenous Every 6 hours 09/22/11 1504 09/23/11 0425   09/22/11 0833   ceFAZolin (ANCEF) IVPB 2 g/50 mL premix  Status:  Discontinued        2 g 100 mL/hr over 30 Minutes Intravenous 60 min pre-op 09/22/11 0833 09/22/11 1447   09/21/11 1439   ceFAZolin (ANCEF) IVPB 2 g/50 mL premix        2 g 100 mL/hr over 30 Minutes Intravenous 60 min pre-op 09/21/11 1439 09/22/11 1022        .  Tolerated the procedure well.  Placed with a foley intraoperatively.  Given Ofirmev at induction and for 48 hours.  PCA for analgesia.   POD #1, allowed out of bed to a chair.  PT for ambulation and exercise program.  Foley D/C'd in morning.  IV saline locked.  O2 discontionued.  POD #2, continued PT and ambulation .  The remainder of the hospital course was dedicated to ambulation and strengthening.   The patient was discharged on 3 Days Post-Op in  Good condition.  Blood  products given:none  DIAGNOSTIC STUDIES: Recent vital signs: Patient Vitals for the past 24 hrs:  BP Temp Temp src Pulse Resp SpO2  09/25/11 0643 146/82 mmHg 97.9 F (36.6 C) - 80  18  97 %  10/08/2011 2107 129/84 mmHg 98.3 F (36.8 C) Oral 87  18  100 %  10-08-11 1400 142/75 mmHg 98.2 F (36.8 C) - 86  16  99 %       Recent laboratory studies:  Basename 09/25/11 0620 2011-10-08 0630 09/23/11 0620  WBC 7.9 8.8 8.5  HGB 9.6* 9.7* 10.7*  HCT 27.3* 28.2* 30.8*  PLT 204 194 213    Basename 09/25/11 0620 10/08/2011 0630 09/23/11 0620  NA 139 142 136  K 3.4* 3.8 4.0  CL 104 108 102  CO2 26 26 25   BUN 12 14 19   CREATININE 0.75 0.79 0.96  GLUCOSE 102* 106* 126*  CALCIUM 8.7 8.7 8.5   Lab Results  Component Value Date   INR 1.18 09/22/2011     Recent Radiographic Studies :  No results found.  DISCHARGE INSTRUCTIONS: Discharge Orders    Future Orders Please Complete By Expires   Diet - low sodium heart healthy      Call MD / Call  [phone]      Comments:   If you experience chest pain or shortness of breath, CALL 911 and be transported to the hospital emergency room.  If you develope a fever above 101 F, pus (white drainage) or increased drainage or redness at the wound, or calf pain, call your surgeon's office.   Constipation Prevention      Comments:   Drink plenty of fluids.  Prune juice may be helpful.  You may use a stool softener, such as Colace (over the counter) 100 mg twice a day.  Use MiraLax (over the counter) for constipation as needed.   Increase activity slowly as tolerated      Weight Bearing as taught in Physical Therapy      Comments:   Use a walker or crutches as instructed.   Driving restrictions      Comments:   No driving for 6 weeks   Lifting restrictions      Comments:   No lifting for 6 weeks   Do not put a pillow under the knee. Place it under the heel.      Change dressing      Comments:   Change dressing on friday, then change the dressing daily with  sterile 4 x 4 inch gauze dressing and apply TED hose.  You may clean the incision with alcohol prior to redressing.   TED hose      Comments:   Use stockings (TED hose) for 3 weeks on both leg(s).  You may remove them at night for sleeping.   CPM      Comments:   Continuous passive motion machine (CPM):      Use the CPM from 0 to 90 for 6-8 hours per day.      You may increase by 10 per day.  You may break it up into 2 or 3 sessions per day.      Use CPM for 2 weeks or until you are told to stop.      DISCHARGE MEDICATIONS:   Medication List  As of 09/25/2011 12:08 PM   STOP taking these medications         aspirin EC 81 MG tablet         TAKE these medications         calcium-vitamin D 500-200 MG-UNIT per tablet   Commonly known as: OSCAL WITH D   Take 1 tablet by mouth daily.      diclofenac 50 MG EC tablet   Commonly known as: VOLTAREN   Take 50 mg by mouth 2 (two) times daily.      enoxaparin 40 MG/0.4ML injection   Commonly known as: LOVENOX   Inject 0.4 mLs (40 mg total) into the skin daily.      Glucosamine-Chondroitin Tabs   Take 1 tablet by mouth daily.      methocarbamol 500 MG tablet   Commonly known as: ROBAXIN   Take 1-2 tablets (500-1,000 mg total) by mouth every 6 (six) hours as needed.      oxyCODONE 5 MG immediate release tablet   Commonly known as: Oxy IR/ROXICODONE   Take 1-2 tablets (5-10 mg total) by mouth every 4 (four) hours as needed.      oxyCODONE 10 MG 12 hr tablet   Commonly known as: OXYCONTIN   Take 1 tablet (10 mg total) by mouth every 12 (twelve) hours.      solifenacin 5 MG tablet   Commonly known as: VESICARE  Take 10 mg by mouth every other day.            FOLLOW UP VISIT:   Follow-up Information    Follow up with Raymon Mutton, MD. Call on 10/07/2011.   Contact information:   [address] [place] [address] [phone]          DISPOSITION:  Home    CONDITION:   Good   Briana Miller 09/25/2011, 12:08 PM

## 2011-09-25 NOTE — Progress Notes (Signed)
Spoke with pt - she is preparing for discharge home.  She does not wish to practice further with AE, and reports she is performing toilet transfers, and does not feel she needs to practice shower transfers due to having walk in shower with seat, and she "practiced a bunch before I came in".   She reports good family support who can assist as needed with ADLs.  Will sign off.  Jeani Hawking, OTR/L [phone]

## 2011-10-15 ENCOUNTER — Ambulatory Visit: Payer: BC Managed Care – PPO | Attending: Orthopedic Surgery | Admitting: Physical Therapy

## 2011-10-15 DIAGNOSIS — M25669 Stiffness of unspecified knee, not elsewhere classified: Secondary | ICD-10-CM | POA: Insufficient documentation

## 2011-10-15 DIAGNOSIS — M25569 Pain in unspecified knee: Secondary | ICD-10-CM | POA: Insufficient documentation

## 2011-10-15 DIAGNOSIS — Z96659 Presence of unspecified artificial knee joint: Secondary | ICD-10-CM | POA: Insufficient documentation

## 2011-10-15 DIAGNOSIS — R5381 Other malaise: Secondary | ICD-10-CM | POA: Insufficient documentation

## 2011-10-15 DIAGNOSIS — IMO0001 Reserved for inherently not codable concepts without codable children: Secondary | ICD-10-CM | POA: Insufficient documentation

## 2011-10-15 DIAGNOSIS — R269 Unspecified abnormalities of gait and mobility: Secondary | ICD-10-CM | POA: Insufficient documentation

## 2011-10-16 ENCOUNTER — Ambulatory Visit: Payer: BC Managed Care – PPO | Admitting: Rehabilitation

## 2011-10-20 ENCOUNTER — Ambulatory Visit: Payer: BC Managed Care – PPO | Admitting: Rehabilitation

## 2011-10-22 ENCOUNTER — Ambulatory Visit: Payer: BC Managed Care – PPO | Admitting: Rehabilitation

## 2011-10-24 ENCOUNTER — Ambulatory Visit: Payer: BC Managed Care – PPO | Admitting: Rehabilitation

## 2011-10-27 ENCOUNTER — Ambulatory Visit: Payer: BC Managed Care – PPO | Admitting: Physical Therapy

## 2011-10-29 ENCOUNTER — Ambulatory Visit: Payer: BC Managed Care – PPO | Admitting: Rehabilitation

## 2011-10-31 ENCOUNTER — Ambulatory Visit: Payer: BC Managed Care – PPO | Admitting: Physical Therapy

## 2011-11-03 ENCOUNTER — Ambulatory Visit: Payer: BC Managed Care – PPO | Admitting: Rehabilitation

## 2011-11-04 ENCOUNTER — Ambulatory Visit: Payer: BC Managed Care – PPO | Admitting: Rehabilitation

## 2011-11-11 ENCOUNTER — Ambulatory Visit: Payer: BC Managed Care – PPO | Admitting: Rehabilitation

## 2011-11-13 ENCOUNTER — Ambulatory Visit: Payer: BC Managed Care – PPO | Admitting: Rehabilitation

## 2011-11-17 ENCOUNTER — Ambulatory Visit: Payer: BC Managed Care – PPO | Attending: Orthopedic Surgery | Admitting: Physical Therapy

## 2011-11-17 DIAGNOSIS — Z96659 Presence of unspecified artificial knee joint: Secondary | ICD-10-CM | POA: Insufficient documentation

## 2011-11-17 DIAGNOSIS — IMO0001 Reserved for inherently not codable concepts without codable children: Secondary | ICD-10-CM | POA: Insufficient documentation

## 2011-11-17 DIAGNOSIS — M25669 Stiffness of unspecified knee, not elsewhere classified: Secondary | ICD-10-CM | POA: Insufficient documentation

## 2011-11-17 DIAGNOSIS — R269 Unspecified abnormalities of gait and mobility: Secondary | ICD-10-CM | POA: Insufficient documentation

## 2011-11-17 DIAGNOSIS — R5381 Other malaise: Secondary | ICD-10-CM | POA: Insufficient documentation

## 2011-11-17 DIAGNOSIS — M25569 Pain in unspecified knee: Secondary | ICD-10-CM | POA: Insufficient documentation

## 2011-11-19 ENCOUNTER — Ambulatory Visit: Payer: BC Managed Care – PPO | Admitting: Physical Therapy

## 2011-11-21 ENCOUNTER — Ambulatory Visit: Payer: BC Managed Care – PPO | Admitting: Rehabilitation

## 2011-11-24 ENCOUNTER — Ambulatory Visit: Payer: BC Managed Care – PPO | Admitting: Physical Therapy

## 2011-11-26 ENCOUNTER — Ambulatory Visit: Payer: BC Managed Care – PPO | Admitting: Rehabilitation

## 2011-11-28 ENCOUNTER — Ambulatory Visit: Payer: BC Managed Care – PPO | Admitting: Physical Therapy

## 2011-12-02 ENCOUNTER — Ambulatory Visit: Payer: BC Managed Care – PPO | Admitting: Rehabilitation

## 2011-12-03 ENCOUNTER — Ambulatory Visit: Payer: BC Managed Care – PPO | Admitting: Rehabilitation

## 2011-12-08 ENCOUNTER — Ambulatory Visit: Payer: BC Managed Care – PPO | Admitting: Physical Therapy

## 2011-12-10 ENCOUNTER — Ambulatory Visit: Payer: BC Managed Care – PPO | Admitting: Physical Therapy

## 2011-12-12 ENCOUNTER — Ambulatory Visit: Payer: BC Managed Care – PPO | Admitting: Physical Therapy

## 2012-12-16 DIAGNOSIS — M25519 Pain in unspecified shoulder: Secondary | ICD-10-CM

## 2012-12-16 HISTORY — DX: Pain in unspecified shoulder: M25.519

## 2013-01-25 ENCOUNTER — Other Ambulatory Visit (HOSPITAL_COMMUNITY)
Admission: RE | Admit: 2013-01-25 | Discharge: 2013-01-25 | Disposition: A | Discharge status: Home / Self Care | Payer: BC Managed Care – PPO | Source: Ambulatory Visit | Attending: Obstetrics and Gynecology | Admitting: Obstetrics and Gynecology

## 2013-01-25 ENCOUNTER — Other Ambulatory Visit: Payer: Self-pay | Admitting: Nurse Practitioner

## 2013-01-25 DIAGNOSIS — Z1151 Encounter for screening for human papillomavirus (HPV): Secondary | ICD-10-CM | POA: Insufficient documentation

## 2013-01-25 DIAGNOSIS — Z01419 Encounter for gynecological examination (general) (routine) without abnormal findings: Secondary | ICD-10-CM | POA: Insufficient documentation

## 2013-03-23 HISTORY — PX: ROTATOR CUFF REPAIR: SHX139

## 2013-03-28 DIAGNOSIS — Z9889 Other specified postprocedural states: Secondary | ICD-10-CM | POA: Insufficient documentation

## 2013-03-28 HISTORY — DX: Other specified postprocedural states: Z98.890

## 2013-04-05 ENCOUNTER — Ambulatory Visit: Payer: BC Managed Care – PPO | Attending: Orthopedic Surgery | Admitting: Physical Therapy

## 2013-04-05 DIAGNOSIS — M6281 Muscle weakness (generalized): Secondary | ICD-10-CM | POA: Insufficient documentation

## 2013-04-05 DIAGNOSIS — M25619 Stiffness of unspecified shoulder, not elsewhere classified: Secondary | ICD-10-CM | POA: Insufficient documentation

## 2013-04-05 DIAGNOSIS — R609 Edema, unspecified: Secondary | ICD-10-CM | POA: Insufficient documentation

## 2013-04-05 DIAGNOSIS — M25519 Pain in unspecified shoulder: Secondary | ICD-10-CM | POA: Insufficient documentation

## 2013-04-05 DIAGNOSIS — IMO0001 Reserved for inherently not codable concepts without codable children: Secondary | ICD-10-CM | POA: Insufficient documentation

## 2013-04-07 ENCOUNTER — Ambulatory Visit: Payer: BC Managed Care – PPO | Admitting: Rehabilitation

## 2013-04-08 ENCOUNTER — Encounter: Payer: Self-pay | Admitting: Family

## 2013-04-08 ENCOUNTER — Other Ambulatory Visit: Payer: Self-pay | Admitting: Family

## 2013-04-08 ENCOUNTER — Ambulatory Visit (INDEPENDENT_AMBULATORY_CARE_PROVIDER_SITE_OTHER): Payer: BC Managed Care – PPO | Admitting: Family

## 2013-04-08 VITALS — BP 138/84 | HR 94 | Temp 97.9°F | Resp 16 | Ht 64.75 in | Wt 271.1 lb

## 2013-04-08 DIAGNOSIS — R635 Abnormal weight gain: Secondary | ICD-10-CM

## 2013-04-08 DIAGNOSIS — R0609 Other forms of dyspnea: Secondary | ICD-10-CM | POA: Insufficient documentation

## 2013-04-08 DIAGNOSIS — R3915 Urgency of urination: Secondary | ICD-10-CM

## 2013-04-08 DIAGNOSIS — G4710 Hypersomnia, unspecified: Secondary | ICD-10-CM

## 2013-04-08 DIAGNOSIS — R1013 Epigastric pain: Secondary | ICD-10-CM

## 2013-04-08 DIAGNOSIS — R400 Somnolence: Secondary | ICD-10-CM

## 2013-04-08 DIAGNOSIS — R0602 Shortness of breath: Secondary | ICD-10-CM

## 2013-04-08 DIAGNOSIS — G4733 Obstructive sleep apnea (adult) (pediatric): Secondary | ICD-10-CM | POA: Insufficient documentation

## 2013-04-08 DIAGNOSIS — Z800 Family history of malignant neoplasm of digestive organs: Secondary | ICD-10-CM | POA: Insufficient documentation

## 2013-04-08 DIAGNOSIS — Z23 Encounter for immunization: Secondary | ICD-10-CM

## 2013-04-08 LAB — BASIC METABOLIC PANEL
Calcium: 9.6 mg/dL (ref 8.4–10.5)
Potassium: 4.4 mEq/L (ref 3.5–5.3)
Sodium: 140 mEq/L (ref 135–145)

## 2013-04-08 LAB — CBC WITH DIFFERENTIAL/PLATELET
Eosinophils Relative: 4 % (ref 0–5)
HCT: 37.6 % (ref 36.0–46.0)
Lymphocytes Relative: 36 % (ref 12–46)
Lymphs Abs: 1.9 10*3/uL (ref 0.7–4.0)
MCV: 83.7 fL (ref 78.0–100.0)
Monocytes Absolute: 0.5 10*3/uL (ref 0.1–1.0)
Neutro Abs: 2.6 10*3/uL (ref 1.7–7.7)
RBC: 4.49 MIL/uL (ref 3.87–5.11)
WBC: 5.2 10*3/uL (ref 4.0–10.5)

## 2013-04-08 LAB — POCT URINALYSIS DIPSTICK
Glucose, UA: NEGATIVE
Ketones, UA: NEGATIVE
Leukocytes, UA: NEGATIVE
Spec Grav, UA: 1.01
Urobilinogen, UA: 0.2

## 2013-04-08 LAB — HEPATIC FUNCTION PANEL
ALT: 17 U/L (ref 0–35)
Indirect Bilirubin: 0.3 mg/dL (ref 0.0–0.9)
Total Protein: 7.2 g/dL (ref 6.0–8.3)

## 2013-04-08 MED ORDER — SOLIFENACIN SUCCINATE 10 MG PO TABS: ORAL_TABLET | ORAL | Status: DC

## 2013-04-08 MED ORDER — CITALOPRAM HYDROBROMIDE 10 MG PO TABS: 5.0000 mg | ORAL_TABLET | Freq: Every day | ORAL | Status: DC

## 2013-04-08 MED ORDER — OMEPRAZOLE 40 MG PO CPDR: 40.0000 mg | DELAYED_RELEASE_CAPSULE | Freq: Every day | ORAL | Status: DC

## 2013-04-08 NOTE — Assessment & Plan Note (Signed)
Suspect OSA. Will refer for sleep study.

## 2013-04-08 NOTE — Progress Notes (Signed)
Subjective:    Patient ID: Briana Miller, female    DOB: 1956-05-27, 57 y.o.   MRN: [id number]  HPI  Briana Miller is a 57 yr old female who presents today to establish care.  Reports that she has gained about 15 pounds in last 18 months. Reports generally sedentary lifestyle.    Breakfast- during the week eats oatmeal, weekends- omelette  Lunch- pizza, chicken, salad, brings her lunch for work.  Will eat chips or peanuts in the afternoon  Dinner- boiled chicken and rice, bbq chicken/pork chops.  Only makes veggies on sundays.    Beverage- drinks 2 gingerales/week.  Drinks sugar free lemonade packs in water during the day.   Denies HS snacks unless she is out.    Fatigue- reports loud snoring per husband, complains more since she has lost weight. Wakes up feeling   Urinary urgency- gets up 4-5 times a night due to urinary urgency.  Drinks water with pills at bedtime.  Denies dysuria.  Tried vesicare previously without improvement in her symptoms.   DJD- R TKR, had scope right shoulder. Now having left knee pain.   She is s/p R RTC repair 03/23/13.  Dr. Valentina Gu has advised her to wait until right shoulder is stable before she looks into   Pt new to establish care. Would like to discuss weight gain. Notes shortness of breath x 6 months. Has frequent urination at night. Last colonoscopy 2004, mammogram 02/2013 (normal per pt). Last tetanus in the 1980s. Has never had flu or pneumonia vaccines.  Nausea- reports stomach feels "like it is in knots."  Pain is epigastric.  Does not relate to PO intake.  Improved by nothing.  Worsened by nothing.  Sometimes she will take tums gasx without relief.  DOE- reports that she felt very sob even walking from the car to the building. Feels that it is related to her weight.  She denies wheezing.  Some nights she feels like she needs to prop her self up on 3 pillows.  Other nights she can sleep on one big pillow only. She denies associated chest pain. She  does report LE edema which is worse at the end of the day. Reports that the swelling can be pitting at times.    Review of Systems  Constitutional: Positive for unexpected weight change.  HENT: Positive for rhinorrhea.   Respiratory: Positive for shortness of breath. Negative for cough.   Gastrointestinal: Positive for nausea and abdominal pain.  Genitourinary: Negative for dysuria.  Neurological: Negative for headaches.  Hematological: Negative for adenopathy.  Psychiatric/Behavioral:       Denies depression/anxiety.       Objective:   Physical Exam  Constitutional: She is oriented to person, place, and time. She appears well-developed and well-nourished. No distress.  HENT:  Head: Normocephalic and atraumatic.  Right Ear: Tympanic membrane and ear canal normal.  Left Ear: Tympanic membrane and ear canal normal.  Mouth/Throat: No oropharyngeal exudate, posterior oropharyngeal edema or posterior oropharyngeal erythema.  Cardiovascular: Normal rate and regular rhythm.   No murmur heard. Pulmonary/Chest: Effort normal and breath sounds normal. No respiratory distress. She has no wheezes. She has no rales. She exhibits no tenderness.  Abdominal: Soft. Bowel sounds are normal.  Musculoskeletal: She exhibits no edema.  Neurological: She is alert and oriented to person, place, and time.  Psychiatric: She has a normal mood and affect. Her behavior is normal. Judgment and thought content normal.          Assessment &  Plan:

## 2013-04-08 NOTE — Assessment & Plan Note (Signed)
Will order screening colonoscopy

## 2013-04-08 NOTE — Assessment & Plan Note (Signed)
Check BNP, 2-D echo, ekg.

## 2013-04-08 NOTE — Patient Instructions (Signed)
You will be contacted about your sleep study, colonoscopy and echocardiogram. Please complete lab work prior to leaving. Start omeprazole for your stomach discomfort. Start vesicare 10mg .  I want you to start at 1/2 tab once daily for 1 week (this was your previous dose) then increase to a full tablet on week two as tolerated.  Hopefully the higher dose will help you more.  Follow up in 1 month for a fasting physical.

## 2013-04-08 NOTE — Assessment & Plan Note (Signed)
UA notes trace blood. Will send for culture. Wanted to try her on myrbetriq but non-formulary. No improvement in the past on vesicare 5, will try resuming with titration up to 10mg  as tolerated.

## 2013-04-08 NOTE — Assessment & Plan Note (Signed)
Discussed healthy diet, exercise weight loss.

## 2013-04-08 NOTE — Assessment & Plan Note (Signed)
Check lft, trial of PPI. Consider abdominal US next visit if not improved +/- GI referral.

## 2013-04-09 LAB — BRAIN NATRIURETIC PEPTIDE: Brain Natriuretic Peptide: 55.2 pg/mL (ref 0.0–100.0)

## 2013-04-10 LAB — URINE CULTURE: Colony Count: 3000

## 2013-04-11 ENCOUNTER — Encounter: Payer: Self-pay | Admitting: Family

## 2013-04-12 ENCOUNTER — Ambulatory Visit: Payer: BC Managed Care – PPO | Admitting: Rehabilitation

## 2013-04-13 ENCOUNTER — Ambulatory Visit (HOSPITAL_BASED_OUTPATIENT_CLINIC_OR_DEPARTMENT_OTHER)
Admission: RE | Admit: 2013-04-13 | Discharge: 2013-04-13 | Disposition: A | Discharge status: Home / Self Care | Payer: BC Managed Care – PPO | Source: Ambulatory Visit | Attending: Family | Admitting: Family

## 2013-04-13 DIAGNOSIS — R0602 Shortness of breath: Secondary | ICD-10-CM

## 2013-04-14 ENCOUNTER — Encounter: Payer: Self-pay | Admitting: Gastroenterology

## 2013-04-15 ENCOUNTER — Ambulatory Visit: Payer: BC Managed Care – PPO | Admitting: Rehabilitation

## 2013-04-15 ENCOUNTER — Other Ambulatory Visit (HOSPITAL_COMMUNITY): Payer: Self-pay | Admitting: Family

## 2013-04-15 ENCOUNTER — Ambulatory Visit (HOSPITAL_COMMUNITY)
Admission: RE | Admit: 2013-04-15 | Discharge: 2013-04-15 | Disposition: A | Discharge status: Home / Self Care | Payer: BC Managed Care – PPO | Source: Ambulatory Visit | Attending: Family | Admitting: Family

## 2013-04-15 DIAGNOSIS — I359 Nonrheumatic aortic valve disorder, unspecified: Secondary | ICD-10-CM | POA: Insufficient documentation

## 2013-04-15 DIAGNOSIS — E669 Obesity, unspecified: Secondary | ICD-10-CM | POA: Insufficient documentation

## 2013-04-15 DIAGNOSIS — R0989 Other specified symptoms and signs involving the circulatory and respiratory systems: Secondary | ICD-10-CM | POA: Insufficient documentation

## 2013-04-15 DIAGNOSIS — I079 Rheumatic tricuspid valve disease, unspecified: Secondary | ICD-10-CM | POA: Insufficient documentation

## 2013-04-15 DIAGNOSIS — I369 Nonrheumatic tricuspid valve disorder, unspecified: Secondary | ICD-10-CM

## 2013-04-15 DIAGNOSIS — R0602 Shortness of breath: Secondary | ICD-10-CM

## 2013-04-15 DIAGNOSIS — I517 Cardiomegaly: Secondary | ICD-10-CM | POA: Insufficient documentation

## 2013-04-15 DIAGNOSIS — R609 Edema, unspecified: Secondary | ICD-10-CM | POA: Insufficient documentation

## 2013-04-15 DIAGNOSIS — R0609 Other forms of dyspnea: Secondary | ICD-10-CM | POA: Insufficient documentation

## 2013-04-15 NOTE — Progress Notes (Signed)
*  PRELIMINARY RESULTS* Echocardiogram 2D Echocardiogram has been performed.  Briana Miller 04/15/2013, 12:46 PM

## 2013-04-18 ENCOUNTER — Telehealth: Payer: Self-pay | Admitting: Family

## 2013-04-18 DIAGNOSIS — R0602 Shortness of breath: Secondary | ICD-10-CM

## 2013-04-18 NOTE — Telephone Encounter (Signed)
Left message on cell# to return my call. 

## 2013-04-18 NOTE — Telephone Encounter (Signed)
Please call pt and let her know that her echo shows that her heart is not pumping as strongly as it should.  This may explain some of her shortness of breath. I would like for her to meet with cardiology for further evaluation.

## 2013-04-18 NOTE — Telephone Encounter (Signed)
Pt returned my call and was notified of below result. Pt voices understanding and is agreeable to proceed with referral.

## 2013-04-19 ENCOUNTER — Ambulatory Visit: Payer: BC Managed Care – PPO | Attending: Orthopedic Surgery | Admitting: Physical Therapy

## 2013-04-19 DIAGNOSIS — IMO0001 Reserved for inherently not codable concepts without codable children: Secondary | ICD-10-CM | POA: Insufficient documentation

## 2013-04-19 DIAGNOSIS — R609 Edema, unspecified: Secondary | ICD-10-CM | POA: Insufficient documentation

## 2013-04-19 DIAGNOSIS — M25619 Stiffness of unspecified shoulder, not elsewhere classified: Secondary | ICD-10-CM | POA: Insufficient documentation

## 2013-04-19 DIAGNOSIS — M25519 Pain in unspecified shoulder: Secondary | ICD-10-CM | POA: Insufficient documentation

## 2013-04-19 DIAGNOSIS — M6281 Muscle weakness (generalized): Secondary | ICD-10-CM | POA: Insufficient documentation

## 2013-04-22 ENCOUNTER — Ambulatory Visit: Payer: BC Managed Care – PPO | Admitting: Rehabilitation

## 2013-04-26 ENCOUNTER — Ambulatory Visit: Payer: BC Managed Care – PPO | Admitting: Physical Therapy

## 2013-04-29 ENCOUNTER — Ambulatory Visit: Payer: BC Managed Care – PPO | Admitting: Physical Therapy

## 2013-05-03 ENCOUNTER — Ambulatory Visit: Payer: BC Managed Care – PPO | Admitting: Physical Therapy

## 2013-05-05 ENCOUNTER — Ambulatory Visit: Payer: BC Managed Care – PPO | Admitting: Physical Therapy

## 2013-05-06 ENCOUNTER — Ambulatory Visit (INDEPENDENT_AMBULATORY_CARE_PROVIDER_SITE_OTHER): Payer: BC Managed Care – PPO | Admitting: Family

## 2013-05-06 ENCOUNTER — Encounter: Payer: Self-pay | Admitting: Family

## 2013-05-06 VITALS — BP 146/96 | HR 96 | Temp 98.6°F | Resp 16 | Ht 64.75 in | Wt 266.1 lb

## 2013-05-06 DIAGNOSIS — R1011 Right upper quadrant pain: Secondary | ICD-10-CM

## 2013-05-06 LAB — CBC WITH DIFFERENTIAL/PLATELET
Basophils Absolute: 0 10*3/uL (ref 0.0–0.1)
Eosinophils Relative: 4 % (ref 0–5)
HCT: 36.6 % (ref 36.0–46.0)
Hemoglobin: 12.6 g/dL (ref 12.0–15.0)
Lymphocytes Relative: 34 % (ref 12–46)
Lymphs Abs: 1.6 10*3/uL (ref 0.7–4.0)
MCV: 82.4 fL (ref 78.0–100.0)
Monocytes Absolute: 0.8 10*3/uL (ref 0.1–1.0)
Monocytes Relative: 18 % — ABNORMAL HIGH (ref 3–12)
Neutro Abs: 2.1 10*3/uL (ref 1.7–7.7)
RBC: 4.44 MIL/uL (ref 3.87–5.11)
RDW: 14.4 % (ref 11.5–15.5)
WBC: 4.6 10*3/uL (ref 4.0–10.5)

## 2013-05-06 LAB — HEPATIC FUNCTION PANEL
ALT: 13 U/L (ref 0–35)
Bilirubin, Direct: 0.1 mg/dL (ref 0.0–0.3)
Indirect Bilirubin: 0.3 mg/dL (ref 0.0–0.9)
Total Bilirubin: 0.4 mg/dL (ref 0.3–1.2)

## 2013-05-06 LAB — LIPASE: Lipase: 14 U/L (ref 0–75)

## 2013-05-06 NOTE — Patient Instructions (Signed)
Please complete lab work prior to leaving. Continue omeprazole. Schedule ultrasound on the first floor prior to leaving today. Follow up in 2 weeks.

## 2013-05-06 NOTE — Progress Notes (Signed)
Subjective:    Patient ID: Briana Miller, female    DOB: 1956-03-17, 57 y.o.   MRN: [id number]  HPI  Briana Miller is a 57 yr old female who presents today with chief complaint of epigastric pain. She reports that epigastric pain started about 5 days ago.  Reports that since her last visit her "stomach episodes have gotten worse."  At a piece of fried chicken on Friday.  Reports stomach "got into a knot."  Used maalox, pepto bismol, tums.  Reports that an episode last about 10 minutes. Reports hurts worse if she eats anything greasy.  Reports yesterday she ate a piece of chicken pie and "that even hurt."  Reports + loud burping and and acid reflux.  Last few days has noticed some diarrhea. Denies black or bloody stools.     Review of Systems    see HPI  Past Medical History  Diagnosis Date  . Arthritis     History   Social History  . Marital Status: Married    Spouse Name: N/A    Number of Children: N/A  . Years of Education: N/A   Occupational History  . Not on file.   Social History Main Topics  . Smoking status: Never Smoker   . Smokeless tobacco: Never Used  . Alcohol Use: Yes     Comment: None at present. Usually has 2 glasses wine daily  . Drug Use: No  . Sexual Activity: Not on file   Other Topics Concern  . 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 Knee arthroscopy      2007  . Total knee arthroplasty  09/22/2011    Procedure: TOTAL KNEE ARTHROPLASTY;  Surgeon: Raymon Mutton, MD;  Location: [facility] OR;  Service: Orthopedics;  Laterality: Right;  . Rotator cuff repair Right 03/23/13    Family History  Problem Relation Age of Onset  . Cancer Mother      colon 55 and breast 9- deceased  . Diabetes Father     living  . Asthma Father   . Hypertension Father   . Heart attack Father 61    medical management per pt  . Glaucoma Father     had eye transplant    No Known Allergies  Current Outpatient Prescriptions on File Prior to Visit  Medication Sig Dispense Refill  . calcium-vitamin D (OSCAL WITH D) 500-200 MG-UNIT per tablet Take 1 tablet by mouth daily.      . citalopram (CELEXA) 10 MG tablet Take 0.5 tablets (5 mg total) by mouth daily.  30 tablet  1  . diclofenac (VOLTAREN) 50 MG EC tablet Take 50 mg by mouth 2 (two) times daily.      Marland Kitchen enoxaparin (LOVENOX) 40 MG/0.4ML injection Inject 0.4 mLs (40 mg total) into the skin daily.  11 Syringe  0  . Glucosamine-Chondroit-Vit C-Mn (GLUCOSAMINE-CHONDROITIN) TABS Take 1 tablet by mouth daily.      Marland Kitchen omeprazole (PRILOSEC) 40 MG capsule Take 1 capsule (40 mg total) by mouth daily.  30 capsule  3  . solifenacin (VESICARE) 10 MG tablet  Take 1/2 tablet by mouth daily, increase to full tablet by mouth daily in 1 week if tolerated.  30 tablet  2   No current facility-administered medications on file prior to visit.    BP 146/96  Pulse 96  Temp(Src) 98.6 F (37 C) (Oral)  Resp 16  Ht 5' 4.75" (1.645 m)  Wt 266 lb 1.9 oz (120.711 kg)  BMI 44.61 kg/m2  SpO2 98%  LMP 06/17/2007    Objective:   Physical Exam  Constitutional: She is oriented to person, place, and time. She appears well-developed and well-nourished. No distress.  Cardiovascular: Normal rate and regular rhythm.   No murmur heard. Pulmonary/Chest: Effort normal and breath sounds normal. No respiratory distress. She has no wheezes. She has no rales. She exhibits no tenderness.  Abdominal: Soft. Bowel sounds are normal. She exhibits no distension. There is tenderness in the right upper quadrant. There is no rigidity, no rebound and no guarding.  Musculoskeletal: She exhibits no edema.  Neurological: She is alert and  oriented to person, place, and time.  Psychiatric: She has a normal mood and affect. Her behavior is normal. Judgment and thought content normal.          Assessment & Plan:

## 2013-05-06 NOTE — Progress Notes (Signed)
Pre visit review using our clinic review tool, if applicable. No additional management support is needed unless otherwise documented below in the visit note. 

## 2013-05-06 NOTE — Assessment & Plan Note (Signed)
Suspect cholecystitis. Obtain cbc, lft, lipase and abdominal US.  Surgical referral if abnormal Korea.  Continue PPI.  Pt instructed to go to the ED if symptoms worsen.

## 2013-05-09 ENCOUNTER — Encounter: Payer: Self-pay | Admitting: Family

## 2013-05-10 ENCOUNTER — Ambulatory Visit (HOSPITAL_BASED_OUTPATIENT_CLINIC_OR_DEPARTMENT_OTHER): Payer: BC Managed Care – PPO

## 2013-05-10 ENCOUNTER — Encounter: Payer: BC Managed Care – PPO | Admitting: Family

## 2013-05-10 ENCOUNTER — Ambulatory Visit: Payer: BC Managed Care – PPO | Admitting: Rehabilitation

## 2013-05-10 ENCOUNTER — Telehealth: Payer: Self-pay | Admitting: Family

## 2013-05-10 DIAGNOSIS — R1013 Epigastric pain: Secondary | ICD-10-CM

## 2013-05-10 NOTE — Telephone Encounter (Signed)
Pls see new order. Please cancel Korea at [facility].

## 2013-05-10 NOTE — Addendum Note (Signed)
Addended by: Sandford Craze on: 05/10/2013 12:30 PM   Modules accepted: Orders

## 2013-05-10 NOTE — Telephone Encounter (Signed)
Patient walked in the office stating that she would have to pay $87 for the US of the abdomen if [facility] went to [facility]. Patient is requesting to go to [facility] off of [address] for this. Best # [phone] to reach patient.

## 2013-05-16 ENCOUNTER — Ambulatory Visit: Payer: BC Managed Care – PPO | Attending: Orthopedic Surgery | Admitting: Rehabilitation

## 2013-05-16 DIAGNOSIS — R609 Edema, unspecified: Secondary | ICD-10-CM | POA: Insufficient documentation

## 2013-05-16 DIAGNOSIS — M6281 Muscle weakness (generalized): Secondary | ICD-10-CM | POA: Insufficient documentation

## 2013-05-16 DIAGNOSIS — M25519 Pain in unspecified shoulder: Secondary | ICD-10-CM | POA: Insufficient documentation

## 2013-05-16 DIAGNOSIS — M25619 Stiffness of unspecified shoulder, not elsewhere classified: Secondary | ICD-10-CM | POA: Insufficient documentation

## 2013-05-16 DIAGNOSIS — IMO0001 Reserved for inherently not codable concepts without codable children: Secondary | ICD-10-CM | POA: Insufficient documentation

## 2013-05-16 NOTE — Telephone Encounter (Signed)
Pt walked in to the office wanting results of her abdominal ultrasound done at [facility].  Result has been sent to Provider for review and I advised pt we will call her with the result.  Please advise.

## 2013-05-16 NOTE — Telephone Encounter (Signed)
Reviewed Korea, notes normal gallbladder. Note is made of mild hepatic enlargement.  Spoke with pt. Reports pain is currently ok since she has not eaten anything greasy.  Will refer to gastroenterology for further evaluation. She is agreeable to referral.

## 2013-05-18 ENCOUNTER — Encounter: Payer: Self-pay | Admitting: Gastroenterology

## 2013-05-18 ENCOUNTER — Ambulatory Visit (INDEPENDENT_AMBULATORY_CARE_PROVIDER_SITE_OTHER): Payer: BC Managed Care – PPO | Admitting: Gastroenterology

## 2013-05-18 ENCOUNTER — Other Ambulatory Visit (INDEPENDENT_AMBULATORY_CARE_PROVIDER_SITE_OTHER): Payer: BC Managed Care – PPO

## 2013-05-18 VITALS — BP 130/80 | HR 88 | Ht 66.0 in | Wt 273.0 lb

## 2013-05-18 DIAGNOSIS — R1011 Right upper quadrant pain: Secondary | ICD-10-CM

## 2013-05-18 DIAGNOSIS — Z1211 Encounter for screening for malignant neoplasm of colon: Secondary | ICD-10-CM

## 2013-05-18 DIAGNOSIS — R110 Nausea: Secondary | ICD-10-CM

## 2013-05-18 LAB — HEPATIC FUNCTION PANEL
Alkaline Phosphatase: 80 U/L (ref 39–117)
Bilirubin, Direct: 0.1 mg/dL (ref 0.0–0.3)
Total Bilirubin: 0.4 mg/dL (ref 0.3–1.2)
Total Protein: 7.8 g/dL (ref 6.0–8.3)

## 2013-05-18 MED ORDER — MOVIPREP 100 G PO SOLR: 1.0000 | Freq: Once | ORAL | Status: DC

## 2013-05-18 NOTE — Progress Notes (Signed)
05/18/2013 Briana Miller [id number] [birth year]   HISTORY OF PRESENT ILLNESS:  This is a 57 year old female who was scheduled for a direct colonoscopy for later this month but was supposed to come in today for her pre-visit for the colonoscopy instructions. Apparently she was complaining of right upper quadrant abdominal pain so was asked to be scheduled to see a provider today.  She states she's been having episodes of right upper quadrant abdominal pain for approximately 3 weeks or so. It has been occurring every day but getting worse the last few days. Once again, the pain is intermittent and lasts about 15-25 minutes before subsiding. She states that last night she ate some chicken fried in olive oil and had a lot of pain after eating that. She had nausea with these episodes as well. Complains of some diarrhea for the last couple of days and says that it was bright yellow in color. She does take diclofenac 50 mg twice daily for aches and pains. She's been taking it regularly like this for the past 3 weeks. She takes omeprazole 40 mg only as needed for heartburn and reflux symptoms.   Past Medical History  Diagnosis Date  . Arthritis   . GERD (gastroesophageal reflux disease)   . Overactive bladder    Past Surgical History  Procedure Laterality Date  . Knee arthroscopy      2007  . Total knee arthroplasty  09/22/2011    Procedure: TOTAL KNEE ARTHROPLASTY;  Surgeon: Raymon Mutton, MD;  Location: [facility] OR;  Service: Orthopedics;  Laterality: Right;  . Rotator cuff repair Right 03/23/13    reports that she has never smoked. She has never used smokeless tobacco. She reports that she drinks alcohol. She reports that she does not use illicit drugs. family history includes Asthma in her father; Cancer in her mother; Diabetes in her father; Glaucoma in her father; Heart attack (age of onset: 80) in her father; Hypertension in her father. No Known Allergies    Outpatient Encounter Prescriptions as  of 05/18/2013  Medication Sig  . calcium-vitamin D (OSCAL WITH D) 500-200 MG-UNIT per tablet Take 1 tablet by mouth daily.  . diclofenac (VOLTAREN) 50 MG EC tablet Take 50 mg by mouth 2 (two) times daily.  . Glucosamine-Chondroit-Vit C-Mn (GLUCOSAMINE-CHONDROITIN) TABS Take 1 tablet by mouth daily.  Marland Kitchen omeprazole (PRILOSEC) 40 MG capsule Take 1 capsule (40 mg total) by mouth daily.  . citalopram (CELEXA) 10 MG tablet Take 0.5 tablets (5 mg total) by mouth daily.  Marland Kitchen enoxaparin (LOVENOX) 40 MG/0.4ML injection Inject 0.4 mLs (40 mg total) into the skin daily.  Marland Kitchen MOVIPREP 100 G SOLR Take 1 kit (200 g total) by mouth once. "Pharmacist please use BIN: [id number] GROUP: [id number] ID: [id number] Call [phone] for pharmacy questions "Pt will save $10"  . [DISCONTINUED] MOVIPREP 100 G SOLR Take 1 kit (200 g total) by mouth once. "Pharmacist please use BIN: [id number] GROUP: [id number] ID: [id number] Call [phone] for pharmacy questions "Pt will save $10"  . [DISCONTINUED] solifenacin (VESICARE) 10 MG tablet Take 1/2 tablet by mouth daily, increase to full tablet by mouth daily in 1 week if tolerated.     REVIEW OF SYSTEMS  : All other systems reviewed and negative except where noted in the History of Present Illness.   PHYSICAL EXAM: BP 130/80  Pulse 88  Ht 5\' 6"  (1.676 m)  Wt 273 lb (123.832 kg)  BMI 44.08 kg/m2  LMP 06/17/2007 General: Well developed black female in  no acute distress Head: Normocephalic and atraumatic Eyes:  Sclerae anicteric, conjunctiva pink. Ears: Normal auditory acuity. Lungs: Clear throughout to auscultation Heart: Regular rate and rhythm. Abdomen: Soft, non-distended.  BS present.  Mild RUQ/right epigastric TTP without R/R/G. Rectal:  Deferred.  Will be done at the time of colonoscopy. Musculoskeletal: Symmetrical with no gross deformities  Skin: No lesions on visible extremities Extremities: 1+ pitting edema in B/L LE's Neurological: Alert oriented x 4,  grossly non-focal Psychological:  Alert and cooperative. Normal mood and affect  ASSESSMENT AND PLAN: -Intermittent right upper quadrant abdominal pain with nausea:  Highly suspicious for gallbladder pathology. She states she had ultrasound at the end of last week through [facility], which was reportedly negative. Consider ulcer disease as well since she takes twice daily diclofenac. We will obtain the results of her ultrasound. We'll schedule HIDA scan to rule out gallbladder dysfunction. We'll send her for a hepatic function panel today. I've asked her to begin taking her omeprazole 40 mg every day. If HIDA scan is negative we may need to EGD on to colonoscopy. -Screening colonoscopy:  Procedure is already scheduled for later this month and she will be given her instructions today.

## 2013-05-18 NOTE — Patient Instructions (Signed)
Please go to the basement level to have your labs drawn.   We scheduled the Hidascan test for 12-15 Monday at 1:00 PM .  Arrive at 12:45 PM.  Have nothing by mouth after 7:00 am .  You have been scheduled for a colonoscopy with propofol. Please follow written instructions given to you at your visit today.  Please pick up your prep kit at the pharmacy within the next 1-3 days. If you use inhalers (even only as needed), please bring them with you on the day of your procedure. Your physician has requested that you go to www.[email or web address] and enter the access code given to you at your visit today. This web site gives a general overview about your procedure. However, you should still follow specific instructions given to you by our office regarding your preparation for the procedure.

## 2013-05-19 ENCOUNTER — Ambulatory Visit: Payer: BC Managed Care – PPO | Admitting: Rehabilitation

## 2013-05-19 NOTE — Progress Notes (Signed)
i agree with the plan above.  Need outside records, then likely HIDA

## 2013-05-20 ENCOUNTER — Encounter: Payer: BC Managed Care – PPO | Admitting: Family

## 2013-05-20 ENCOUNTER — Ambulatory Visit: Payer: BC Managed Care – PPO | Admitting: Family

## 2013-05-23 ENCOUNTER — Ambulatory Visit: Payer: BC Managed Care – PPO | Admitting: Rehabilitation

## 2013-05-26 ENCOUNTER — Ambulatory Visit: Payer: BC Managed Care – PPO | Admitting: Rehabilitation

## 2013-05-27 ENCOUNTER — Encounter: Payer: Self-pay | Admitting: Family Medicine

## 2013-05-27 ENCOUNTER — Ambulatory Visit (HOSPITAL_BASED_OUTPATIENT_CLINIC_OR_DEPARTMENT_OTHER): Payer: BC Managed Care – PPO

## 2013-05-30 ENCOUNTER — Encounter (HOSPITAL_COMMUNITY)
Admission: RE | Admit: 2013-05-30 | Discharge: 2013-05-30 | Disposition: A | Discharge status: Home / Self Care | Payer: BC Managed Care – PPO | Source: Ambulatory Visit | Attending: Gastroenterology | Admitting: Gastroenterology

## 2013-05-30 ENCOUNTER — Ambulatory Visit: Payer: BC Managed Care – PPO | Admitting: Physical Therapy

## 2013-05-30 DIAGNOSIS — R109 Unspecified abdominal pain: Secondary | ICD-10-CM | POA: Insufficient documentation

## 2013-05-30 DIAGNOSIS — R1011 Right upper quadrant pain: Secondary | ICD-10-CM

## 2013-05-30 MED ORDER — TECHNETIUM TC 99M MEBROFENIN IV KIT
5.3000 | PACK | Freq: Once | INTRAVENOUS | Status: AC | PRN
  Administered 2013-05-30: 5 via INTRAVENOUS

## 2013-05-31 ENCOUNTER — Other Ambulatory Visit: Payer: Self-pay | Admitting: *Deleted

## 2013-05-31 DIAGNOSIS — R1011 Right upper quadrant pain: Secondary | ICD-10-CM

## 2013-06-01 ENCOUNTER — Ambulatory Visit (INDEPENDENT_AMBULATORY_CARE_PROVIDER_SITE_OTHER): Payer: BC Managed Care – PPO | Admitting: Family

## 2013-06-01 ENCOUNTER — Encounter: Payer: Self-pay | Admitting: Family

## 2013-06-01 ENCOUNTER — Encounter: Payer: Self-pay | Admitting: Cardiology

## 2013-06-01 ENCOUNTER — Ambulatory Visit: Payer: BC Managed Care – PPO | Admitting: Rehabilitation

## 2013-06-01 ENCOUNTER — Ambulatory Visit (INDEPENDENT_AMBULATORY_CARE_PROVIDER_SITE_OTHER): Payer: BC Managed Care – PPO | Admitting: Cardiology

## 2013-06-01 ENCOUNTER — Encounter: Payer: Self-pay | Admitting: *Deleted

## 2013-06-01 VITALS — BP 140/98 | HR 90 | Temp 97.7°F | Resp 16 | Ht 64.75 in | Wt 273.0 lb

## 2013-06-01 VITALS — BP 142/80 | HR 86 | Ht 65.0 in | Wt 270.0 lb

## 2013-06-01 DIAGNOSIS — I10 Essential (primary) hypertension: Secondary | ICD-10-CM

## 2013-06-01 DIAGNOSIS — J309 Allergic rhinitis, unspecified: Secondary | ICD-10-CM

## 2013-06-01 DIAGNOSIS — I420 Dilated cardiomyopathy: Secondary | ICD-10-CM

## 2013-06-01 DIAGNOSIS — I428 Other cardiomyopathies: Secondary | ICD-10-CM | POA: Insufficient documentation

## 2013-06-01 DIAGNOSIS — R079 Chest pain, unspecified: Secondary | ICD-10-CM

## 2013-06-01 DIAGNOSIS — J069 Acute upper respiratory infection, unspecified: Secondary | ICD-10-CM

## 2013-06-01 MED ORDER — FLUTICASONE PROPIONATE 50 MCG/ACT NA SUSP: 2.0000 | Freq: Every day | NASAL | Status: DC

## 2013-06-01 MED ORDER — FUROSEMIDE 20 MG PO TABS: 20.0000 mg | ORAL_TABLET | Freq: Every day | ORAL | Status: DC

## 2013-06-01 MED ORDER — LORATADINE 10 MG PO TABS: 10.0000 mg | ORAL_TABLET | Freq: Every day | ORAL | Status: DC

## 2013-06-01 MED ORDER — METOPROLOL SUCCINATE ER 25 MG PO TB24: 25.0000 mg | ORAL_TABLET | Freq: Every day | ORAL | Status: DC

## 2013-06-01 NOTE — Assessment & Plan Note (Signed)
Patient describes dyspnea on exertion and lower extremity edema. Echo shows ejection fraction of 40-45%. Etiology of cardiomyopathy unclear. TSH normal. No history of hypertension. Schedule Myoview to screen for coronary disease. I have asked her to avoid ETOH (2 drinks of liquor per day previously). I have added Lasix 20 mg daily. Check potassium and renal function in one week. Add Toprol 25 mg daily. We will add an ACE inhibitor in the future as needed.

## 2013-06-01 NOTE — Assessment & Plan Note (Signed)
Trial of claritin and flonase. Follow up if symptoms worsen, or if no improvement.

## 2013-06-01 NOTE — Progress Notes (Signed)
HPI: 57 year old female for evaluation of cardiomyopathy. Echocardiogram in October of 2014 showed an ejection fraction of 40-45%, mild left ventricular hypertrophy and mild left atrial enlargement. Laboratories in October 2014 showed normal BUN, creatinine, hemoglobin and TSH.; BNP 57. Patient has a history of mild dyspnea on exertion. However recently this has worsened. She does not have orthopnea or PND. She has noticed worsening mild bilateral pedal edema. She has occasional pain in her left upper chest and arm. It is described as a squeezing sensation for one to 2 seconds. It is not radiate. She has some diaphoresis but no nausea or dyspnea. Not pleuritic, positional or related to food. No exertional chest pain. Echocardiogram was ordered because of dyspnea.   Current Outpatient Prescriptions  Medication Sig Dispense Refill  . calcium-vitamin D (OSCAL WITH D) 500-200 MG-UNIT per tablet Take 1 tablet by mouth daily.      . citalopram (CELEXA) 10 MG tablet Take 5 mg by mouth daily. PT ONLY TAKES 1/2 TABLET ONCE A WEEK.      . diclofenac (VOLTAREN) 50 MG EC tablet Take 50 mg by mouth 2 (two) times daily as needed.       . fluticasone (FLONASE) 50 MCG/ACT nasal spray Place 2 sprays into both nostrils daily.  16 g  6  . Glucosamine-Chondroit-Vit C-Mn (GLUCOSAMINE-CHONDROITIN) TABS Take 1 tablet by mouth daily.      Marland Kitchen loratadine (CLARITIN) 10 MG tablet Take 1 tablet (10 mg total) by mouth daily.  30 tablet  11  . MOVIPREP 100 G SOLR Take 1 kit (200 g total) by mouth once. "Pharmacist please use BIN: [id number] GROUP: [id number] ID: [id number] Call [phone] for pharmacy questions "Pt will save $10"  1 kit  0  . omeprazole (PRILOSEC) 40 MG capsule Take 1 capsule (40 mg total) by mouth daily.  30 capsule  3   No current facility-administered medications for this visit.    No Known Allergies  Past Medical History  Diagnosis Date  . Arthritis   . GERD (gastroesophageal reflux disease)     . Overactive bladder     Past Surgical History  Procedure Laterality Date  . Knee arthroscopy      2007  . Total knee arthroplasty  09/22/2011    Procedure: TOTAL KNEE ARTHROPLASTY;  Surgeon: Raymon Mutton, MD;  Location: [facility] OR;  Service: Orthopedics;  Laterality: Right;  . Rotator cuff repair Right 03/23/13    History   Social History  . Marital Status: Married    Spouse Name: N/A    Number of Children: 2  . Years of Education: N/A   Occupational History  .     Social History Main Topics  . Smoking status: Never Smoker   . Smokeless tobacco: Never Used  . Alcohol Use: Yes     Comment: None at present. Usually has 2 glasses wine daily  . Drug Use: No  . Sexual Activity: Not on file   Other Topics Concern  . 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 Cancer Mother  colon 60 and breast 54- deceased  . Diabetes Father     living  . Asthma Father   . Hypertension Father   . Heart attack Father 30    medical management per pt  . Glaucoma Father     had eye transplant    ROS: no fevers or chills, productive cough, hemoptysis, dysphasia, odynophagia, melena, hematochezia, dysuria, hematuria, rash, seizure activity, orthopnea, PND, claudication. Remaining systems are negative.  Physical Exam:   Blood pressure 142/80, pulse 86, height 5\' 5"  (1.651 m), weight 270 lb (122.471 kg), last menstrual period 06/17/2007.  General:  Well developed/obese in NAD Skin warm/dry Patient not depressed No peripheral clubbing Back-normal HEENT-normal/normal eyelids Neck supple/normal carotid upstroke bilaterally; no bruits; no JVD; no thyromegaly chest - CTA/ normal  expansion CV - RRR/normal S1 and S2; no murmurs, rubs or gallops;  PMI nondisplaced Abdomen -NT/ND, no HSM, no mass, + bowel sounds, no bruit 2+ femoral pulses, no bruits Ext-1+ edema, no chords, 2+ DP Neuro-grossly nonfocal  ECG 04/08/2013-sinus rhythm with left ventricular hypertrophy.

## 2013-06-01 NOTE — Progress Notes (Signed)
Pre visit review using our clinic review tool, if applicable. No additional management support is needed unless otherwise documented below in the visit note. 

## 2013-06-01 NOTE — Progress Notes (Signed)
Subjective:    Patient ID: Briana Miller, female    DOB: 1956/05/23, 57 y.o.   MRN: [id number]  HPI  Briana Miller presents with dry cough, worse at night. Associated with post nasal drip. Reports that cough started about 1 week ago.  She reports that she tried theraflu without significant improvement.  Tried robitussin without significant improvement. Denies associated malaise or fever.   HTN- notes recent death in family.  Nephew was shot in a home invasion locally.  Has been stressed and attributes her elevated BP to stress.  BP Readings from Last 3 Encounters:  06/01/13 142/80  06/01/13 140/98  05/18/13 130/80    Review of Systems See HPI  Past Medical History  Diagnosis Date  . Arthritis   . GERD (gastroesophageal reflux disease)   . Overactive bladder     History   Social History  . Marital Status: Married    Spouse Name: N/A    Number of Children: N/A  . Years of Education: N/A   Occupational History  . Not on file.   Social History Main Topics  . Smoking status: Never Smoker   . Smokeless tobacco: Never Used  . Alcohol Use: Yes     Comment: None at present. Usually has 2 glasses wine daily  . Drug Use: No  . Sexual Activity: Not on file   Other Topics Concern  . 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 Knee arthroscopy      2007  . Total knee arthroplasty  09/22/2011    Procedure: TOTAL KNEE ARTHROPLASTY;  Surgeon: Raymon Mutton, MD;  Location: [facility] OR;  Service: Orthopedics;  Laterality: Right;  . Rotator cuff repair Right 03/23/13    Family History  Problem Relation Age of Onset  . Cancer Mother     colon 45 and breast 63-  deceased  . Diabetes Father     living  . Asthma Father   . Hypertension Father   . Heart attack Father 36    medical management per pt  . Glaucoma Father     had eye transplant    No Known Allergies  Current Outpatient Prescriptions on File Prior to Visit  Medication Sig Dispense Refill  . calcium-vitamin D (OSCAL WITH D) 500-200 MG-UNIT per tablet Take 1 tablet by mouth daily.      . diclofenac (VOLTAREN) 50 MG EC tablet Take 50 mg by mouth 2 (two) times daily as needed.       . Glucosamine-Chondroit-Vit C-Mn (GLUCOSAMINE-CHONDROITIN) TABS Take 1 tablet by mouth daily.      Marland Kitchen MOVIPREP 100 G SOLR Take 1 kit (200 g total) by mouth once. "Pharmacist please use BIN: [id number] GROUP: [id number] ID: [id number] Call [phone] for pharmacy questions "Pt will save $10"  1 kit  0  . omeprazole (PRILOSEC) 40 MG capsule Take 1 capsule (40 mg total) by mouth daily.  30 capsule  3   No current facility-administered medications on file prior to visit.    BP 140/98  Pulse 90  Temp(Src) 97.7 F (36.5 C) (Oral)  Resp 16  Ht 5' 4.75" (1.645 m)  Wt 273 lb (123.832 kg)  BMI 45.76 kg/m2  SpO2 99%  LMP 06/17/2007       Objective:   Physical Exam  Constitutional: She is oriented to person, place, and time. She appears well-developed and well-nourished. No distress.  HENT:  Head: Normocephalic and atraumatic.  Right Ear: Tympanic membrane and ear canal normal.  Left Ear: Tympanic membrane and ear canal normal.  Mouth/Throat: No oropharyngeal exudate, posterior oropharyngeal edema or posterior oropharyngeal erythema.  Cardiovascular: Normal rate and regular rhythm.   No murmur heard. Pulmonary/Chest: Effort normal and breath sounds normal. No respiratory distress. She has no wheezes. She has no rales. She exhibits no tenderness.  Neurological: She is alert and oriented to person, place, and time.  Psychiatric: She has a normal mood and affect. Her behavior is normal. Judgment and  thought content normal.          Assessment & Plan:

## 2013-06-01 NOTE — Assessment & Plan Note (Signed)
Symptoms atypical. Schedule nuclear study for risk stratification. 

## 2013-06-01 NOTE — Patient Instructions (Signed)
Your physician recommends that you schedule a follow-up appointment in:  6-8 WEEKS WITH DR Jens Som  Your physician has requested that you have a lexiscan myoview. For further information please visit [email or web address] Please follow instruction sheet, as given.   START FUROSEMIDE 20 MG ONCE DAILY  Your physician recommends that you return for lab work in: ONE WEEK IN [place]  START METOPROLOL SUCC ER 25 MG ONCE DAILY

## 2013-06-01 NOTE — Patient Instructions (Signed)
Call if symptoms worsen or if not improved in 2-3 days. Check blood pressure once daily for next few days and forward [place] results. Please schedule a follow up appointment in 2 months.

## 2013-06-02 DIAGNOSIS — R059 Cough, unspecified: Secondary | ICD-10-CM | POA: Insufficient documentation

## 2013-06-02 DIAGNOSIS — R05 Cough: Secondary | ICD-10-CM | POA: Insufficient documentation

## 2013-06-02 NOTE — Assessment & Plan Note (Signed)
Suspect secondary to allergies. Will rx with flonase and claritin. Call if symptoms worsen or if not improved in 2-3 days.

## 2013-06-02 NOTE — Assessment & Plan Note (Signed)
Check blood pressure once daily for next few days and forward [place] results. Continue toprol.

## 2013-06-03 ENCOUNTER — Encounter: Payer: Self-pay | Admitting: Gastroenterology

## 2013-06-03 ENCOUNTER — Ambulatory Visit (AMBULATORY_SURGERY_CENTER): Payer: BC Managed Care – PPO | Admitting: Gastroenterology

## 2013-06-03 VITALS — BP 165/99 | HR 74 | Temp 98.5°F | Resp 15 | Ht 66.0 in | Wt 273.0 lb

## 2013-06-03 DIAGNOSIS — K5730 Diverticulosis of large intestine without perforation or abscess without bleeding: Secondary | ICD-10-CM

## 2013-06-03 DIAGNOSIS — Z1211 Encounter for screening for malignant neoplasm of colon: Secondary | ICD-10-CM

## 2013-06-03 MED ORDER — SODIUM CHLORIDE 0.9 % IV SOLN: 500.0000 mL | INTRAVENOUS | Status: DC

## 2013-06-03 NOTE — Progress Notes (Signed)
Pressure applied to the abdomen to reach the cecum 

## 2013-06-03 NOTE — Op Note (Signed)
[facility] [address] [place], [address]   COLONOSCOPY PROCEDURE REPORT  PATIENT: Briana Miller, Briana Miller  MR#: [id number] BIRTHDATE: 21-Nov-1955 , 57  yrs. old GENDER: Female ENDOSCOPIST: Rachael Fee, MD REFERRED [id number] Peggyann Juba, FNP PROCEDURE DATE:  06/03/2013 PROCEDURE:   Colonoscopy, screening First Screening Colonoscopy - Avg.  risk and is 50 yrs.  old or older Yes.  Prior Negative Screening - Now for repeat screening. N/A  History of Adenoma - Now for follow-up colonoscopy & has been > or = to 3 yrs.  N/A  Polyps Removed Today? No.  Recommend repeat exam, <10 yrs? Yes.  High risk (family or personal hx). ASA CLASS:   Class II INDICATIONS:elevated risk screening, mother had colon cancer in her 38s MEDICATIONS: Fentanyl 100 mcg IV, Versed 10 mg IV, and These medications were titrated to patient response per physician's verbal order  DESCRIPTION OF PROCEDURE:   After the risks benefits and alternatives of the procedure were thoroughly explained, informed consent was obtained.  A digital rectal exam revealed no abnormalities of the rectum.   The LB [id number]  endoscope was introduced through the anus and advanced to the cecum, which was identified by both the appendix and ileocecal valve. No adverse events experienced.   The quality of the prep was good.  The instrument was then slowly withdrawn as the colon was fully examined.   COLON FINDINGS: There were a few small diverticulum in the left colon.  The examination was otherwise normal.  Retroflexed views revealed no abnormalities. The time to cecum=6 minutes 46 seconds. Withdrawal time=6 minutes 30 seconds.  The scope was withdrawn and the procedure completed. COMPLICATIONS: There were no complications.  ENDOSCOPIC IMPRESSION: There were a few small diverticulum in the left colon. The examination was otherwise normal.  No polyps or cancers  RECOMMENDATIONS: Given your significant  family history of colon cancer, you should have a repeat colonoscopy in 5 years   eSigned:  Rachael Fee, MD 06/03/2013 10:16 AM

## 2013-06-03 NOTE — Patient Instructions (Signed)
YOU HAD AN ENDOSCOPIC PROCEDURE TODAY AT THE [facility]: Refer to the procedure report that was given to you for any specific questions about what was found during the examination.  If the procedure report does not answer your questions, please call your gastroenterologist to clarify.  If you requested that your care partner not be given the details of your procedure findings, then the procedure report has been included in a sealed envelope for you to review at your convenience later.  YOU SHOULD EXPECT: Some feelings of bloating in the abdomen. Passage of more gas than usual.  Walking can help get rid of the air that was put into your GI tract during the procedure and reduce the bloating. If you had a lower endoscopy (such as a colonoscopy or flexible sigmoidoscopy) you may notice spotting of blood in your stool or on the toilet paper. If you underwent a bowel prep for your procedure, then you may not have a normal bowel movement for a few days.  DIET: Your first meal following the procedure should be a light meal and then it is ok to progress to your normal diet.  A half-sandwich or bowl of soup is an example of a good first meal.  Heavy or fried foods are harder to digest and may make you feel nauseous or bloated.  Likewise meals heavy in dairy and vegetables can cause extra gas to form and this can also increase the bloating.  Drink plenty of fluids but you should avoid alcoholic beverages for 24 hours.  ACTIVITY: Your care partner should take you home directly after the procedure.  You should plan to take it easy, moving slowly for the rest of the day.  You can resume normal activity the day after the procedure however you should NOT DRIVE or use heavy machinery for 24 hours (because of the sedation medicines used during the test).    SYMPTOMS TO REPORT IMMEDIATELY: A gastroenterologist can be reached at any hour.  During normal business hours, 8:30 AM to 5:00 PM Monday through Friday,  call [phone].  After hours and on weekends, please call the GI answering service at [phone] who will take a message and have the physician on call contact you.   Following lower endoscopy (colonoscopy or flexible sigmoidoscopy):  Excessive amounts of blood in the stool  Significant tenderness or worsening of abdominal pains  Swelling of the abdomen that is new, acute  Fever of 100F or higher  FOLLOW UP: If any biopsies were taken you will be contacted by phone or by letter within the next 1-3 weeks.  Call your gastroenterologist if you have not heard about the biopsies in 3 weeks.  Our staff will call the home number listed on your records the next business day following your procedure to check on you and address any questions or concerns that you may have at that time regarding the information given to you following your procedure. This is a courtesy call and so if there is no answer at the home number and we have not heard from you through the emergency physician on call, we will assume that you have returned to your regular daily activities without incident.  SIGNATURES/CONFIDENTIALITY: You and/or your care partner have signed paperwork which will be entered into your electronic medical record.  These signatures attest to the fact that that the information above on your After Visit Summary has been reviewed and is understood.  Full responsibility of the confidentiality of this   discharge information lies with you and/or your care-partner.  Recommendations See procedure report  

## 2013-06-06 ENCOUNTER — Telehealth: Payer: Self-pay | Admitting: *Deleted

## 2013-06-06 ENCOUNTER — Ambulatory Visit: Payer: BC Managed Care – PPO | Admitting: Physical Therapy

## 2013-06-06 NOTE — Telephone Encounter (Signed)
  Follow up Call-  Call back number 06/03/2013  Post procedure Call Back phone  # [phone]  Permission to leave phone message Yes     Patient questions:  Do you have a fever, pain , or abdominal swelling? no Pain Score  0 *  Have you tolerated food without any problems? yes  Have you been able to return to your normal activities? yes  Do you have any questions about your discharge instructions: Diet   no Medications  no Follow up visit  no  Do you have questions or concerns about your Care? no  Actions: * If pain score is 4 or above: No action needed, pain <4.

## 2013-06-07 ENCOUNTER — Ambulatory Visit: Payer: BC Managed Care – PPO | Admitting: Physical Therapy

## 2013-06-07 DIAGNOSIS — M25569 Pain in unspecified knee: Secondary | ICD-10-CM | POA: Insufficient documentation

## 2013-06-07 HISTORY — DX: Pain in unspecified knee: M25.569

## 2013-06-13 ENCOUNTER — Ambulatory Visit (HOSPITAL_COMMUNITY): Payer: BC Managed Care – PPO | Attending: Cardiology | Admitting: Radiology

## 2013-06-13 ENCOUNTER — Encounter: Payer: Self-pay | Admitting: Cardiology

## 2013-06-13 ENCOUNTER — Encounter: Payer: Self-pay | Admitting: Gastroenterology

## 2013-06-13 VITALS — BP 137/77 | HR 68 | Ht 65.0 in | Wt 272.0 lb

## 2013-06-13 DIAGNOSIS — R42 Dizziness and giddiness: Secondary | ICD-10-CM | POA: Insufficient documentation

## 2013-06-13 DIAGNOSIS — R0989 Other specified symptoms and signs involving the circulatory and respiratory systems: Secondary | ICD-10-CM | POA: Insufficient documentation

## 2013-06-13 DIAGNOSIS — R9431 Abnormal electrocardiogram [ECG] [EKG]: Secondary | ICD-10-CM

## 2013-06-13 DIAGNOSIS — R0602 Shortness of breath: Secondary | ICD-10-CM

## 2013-06-13 DIAGNOSIS — R079 Chest pain, unspecified: Secondary | ICD-10-CM | POA: Insufficient documentation

## 2013-06-13 DIAGNOSIS — Z8249 Family history of ischemic heart disease and other diseases of the circulatory system: Secondary | ICD-10-CM | POA: Insufficient documentation

## 2013-06-13 DIAGNOSIS — R0609 Other forms of dyspnea: Secondary | ICD-10-CM | POA: Insufficient documentation

## 2013-06-13 DIAGNOSIS — I428 Other cardiomyopathies: Secondary | ICD-10-CM | POA: Insufficient documentation

## 2013-06-13 DIAGNOSIS — R002 Palpitations: Secondary | ICD-10-CM | POA: Insufficient documentation

## 2013-06-13 DIAGNOSIS — I10 Essential (primary) hypertension: Secondary | ICD-10-CM | POA: Insufficient documentation

## 2013-06-13 MED ORDER — TECHNETIUM TC 99M SESTAMIBI GENERIC - CARDIOLITE
30.0000 | Freq: Once | INTRAVENOUS | Status: AC | PRN
  Administered 2013-06-13: 30 via INTRAVENOUS

## 2013-06-13 MED ORDER — REGADENOSON 0.4 MG/5ML IV SOLN
0.4000 mg | Freq: Once | INTRAVENOUS | Status: AC
  Administered 2013-06-13: 0.4 mg via INTRAVENOUS

## 2013-06-13 NOTE — Progress Notes (Signed)
[facility] SITE 3 NUCLEAR MED [address] [place], [place] [address] [phone]    Cardiology Nuclear Med Study  Margaretmary Prisk is a 57 y.o. female     MRN : [id number]     DOB: Feb 05, 1956  Procedure Date: 06/13/2013  Nuclear Med Background Indication for Stress Test:  Evaluation for Ischemia History: No prior known history of CAD, 04-15-2013 Echo: EF=40-45% Cardiac Risk Factors: Family History - CAD, Hypertension, and Congestive Dilated Cardiomyopathy Symptoms: Chest Pain with/without exertion (last occurrence couple days ago), Dizziness, DOE, Palpitations and SOB   Nuclear Pre-Procedure Caffeine/Decaff Intake:  None > 12 hrs NPO After: 4:00am   Lungs:  clear O2 Sat: 98% on room air. IV 0.9% NS with Angio Cath:  22g  IV Site: R Forearm x 1, tolerated well IV Started by:  Irean Hong, RN  Chest Size (in):  40 Cup Size: C  Height: 5\' 5"  (1.651 m)  Weight:  272 lb (123.378 kg)  BMI:  Body mass index is 45.26 kg/(m^2). Tech Comments:  Held Toprol x 24 hrs    Nuclear Med Study 1 or 2 day study: 2 day  Stress Test Type:  Treadmill/Lexiscan  Reading MD: Tobias Alexander, MD  Order Authorizing Provider:  Olga Millers, MD  Resting Radionuclide: Technetium 68m Sestamibi  Resting Radionuclide Dose: 33.0 mCi  06/14/13  Stress Radionuclide:  Technetium 32m Sestamibi  Stress Radionuclide Dose: 33.0 mCi  06/13/13          Stress Protocol Rest HR: 68 Stress HR: 130  Rest BP: 137/77 Stress BP: 208/112  Exercise Time (min): 2:00 METS: n/a   Predicted Max HR: 165 bpm % Max HR: 78.79 bpm Rate Pressure Product: 21308   Dose of Adenosine (mg):  n/a Dose of Lexiscan: 0.4 mg  Dose of Atropine (mg): n/a Dose of Dobutamine: n/a mcg/kg/min (at max HR)  Stress Test Technologist: Irean Hong, RN  Nuclear Technologist:  Domenic Polite, CNMT     Rest Procedure:  Myocardial perfusion imaging was performed at rest 45 minutes following the intravenous administration of  Technetium 27m Sestamibi. Rest ECG: NSR-LVH  Stress Procedure:  The patient received IV Lexiscan 0.4 mg over 15-seconds with concurrent low level exercise and then Technetium 42m Sestamibi was injected at 30-seconds while the patient continued walking one more minute.  There was a hypertensive response to exercise with Lexiscan. Quantitative spect images were obtained after a 45-minute delay. Stress ECG: No significant change from baseline ECG  QPS Raw Data Images:  Normal; no motion artifact; normal heart/lung ratio. Stress Images:  Normal homogeneous uptake in all areas of the myocardium. Rest Images:  Normal homogeneous uptake in all areas of the myocardium. Subtraction (SDS):  Normal Transient Ischemic Dilatation (Normal <1.22):  0.97 Lung/Heart Ratio (Normal <0.45):  0.28  Quantitative Gated Spect Images QGS EDV:  142 ml QGS ESV:  70 ml  Impression Exercise Capacity:  Lexiscan with low level exercise. BP Response:  Normal blood pressure response. Clinical Symptoms:  No significant symptoms noted. ECG Impression:  No significant ST segment change suggestive of ischemia. Comparison with Prior Nuclear Study: No previous nuclear study performed  Overall Impression:  Normal stress nuclear study.  LV Ejection Fraction: 51%.  LV Wall Motion:  NL LV Function; NL Wall Motion   Charlton Haws

## 2013-06-14 ENCOUNTER — Ambulatory Visit (HOSPITAL_COMMUNITY): Payer: BC Managed Care – PPO | Attending: Cardiovascular Disease

## 2013-06-14 ENCOUNTER — Encounter: Payer: Self-pay | Admitting: Cardiovascular Disease

## 2013-06-14 ENCOUNTER — Ambulatory Visit: Payer: BC Managed Care – PPO | Admitting: Physical Therapy

## 2013-06-14 ENCOUNTER — Ambulatory Visit: Payer: BC Managed Care – PPO | Admitting: Family Medicine

## 2013-06-14 DIAGNOSIS — R0989 Other specified symptoms and signs involving the circulatory and respiratory systems: Secondary | ICD-10-CM

## 2013-06-14 MED ORDER — TECHNETIUM TC 99M SESTAMIBI GENERIC - CARDIOLITE
33.0000 | Freq: Once | INTRAVENOUS | Status: AC | PRN
  Administered 2013-06-14: 33 via INTRAVENOUS

## 2013-06-17 ENCOUNTER — Ambulatory Visit: Payer: BC Managed Care – PPO | Admitting: Physical Therapy

## 2013-06-17 ENCOUNTER — Telehealth: Payer: Self-pay | Admitting: Cardiology

## 2013-06-17 NOTE — Telephone Encounter (Signed)
New message ° ° ° ° °Want test results °

## 2013-06-17 NOTE — Telephone Encounter (Signed)
Notified of stress test results. 

## 2013-06-21 ENCOUNTER — Ambulatory Visit: Payer: BC Managed Care – PPO | Attending: Orthopedic Surgery | Admitting: Rehabilitation

## 2013-06-21 DIAGNOSIS — M25619 Stiffness of unspecified shoulder, not elsewhere classified: Secondary | ICD-10-CM | POA: Insufficient documentation

## 2013-06-21 DIAGNOSIS — M25519 Pain in unspecified shoulder: Secondary | ICD-10-CM | POA: Insufficient documentation

## 2013-06-21 DIAGNOSIS — M6281 Muscle weakness (generalized): Secondary | ICD-10-CM | POA: Insufficient documentation

## 2013-06-21 DIAGNOSIS — R609 Edema, unspecified: Secondary | ICD-10-CM | POA: Insufficient documentation

## 2013-06-21 DIAGNOSIS — IMO0001 Reserved for inherently not codable concepts without codable children: Secondary | ICD-10-CM | POA: Insufficient documentation

## 2013-06-23 ENCOUNTER — Ambulatory Visit: Payer: BC Managed Care – PPO | Admitting: Rehabilitation

## 2013-06-28 ENCOUNTER — Ambulatory Visit: Payer: BC Managed Care – PPO | Admitting: Physical Therapy

## 2013-06-30 ENCOUNTER — Ambulatory Visit: Payer: BC Managed Care – PPO | Admitting: Rehabilitation

## 2013-07-04 ENCOUNTER — Other Ambulatory Visit: Payer: Self-pay

## 2013-07-04 ENCOUNTER — Ambulatory Visit (INDEPENDENT_AMBULATORY_CARE_PROVIDER_SITE_OTHER): Payer: BC Managed Care – PPO | Admitting: Family

## 2013-07-04 ENCOUNTER — Encounter: Payer: Self-pay | Admitting: Family

## 2013-07-04 ENCOUNTER — Telehealth: Payer: Self-pay

## 2013-07-04 ENCOUNTER — Ambulatory Visit: Payer: BC Managed Care – PPO | Admitting: Rehabilitation

## 2013-07-04 VITALS — BP 118/80 | HR 78 | Temp 97.7°F | Resp 16 | Ht 64.75 in | Wt 270.1 lb

## 2013-07-04 DIAGNOSIS — R05 Cough: Secondary | ICD-10-CM

## 2013-07-04 DIAGNOSIS — R059 Cough, unspecified: Secondary | ICD-10-CM

## 2013-07-04 MED ORDER — ALBUTEROL SULFATE HFA 108 (90 BASE) MCG/ACT IN AERS: 2.0000 | INHALATION_SPRAY | Freq: Four times a day (QID) | RESPIRATORY_TRACT | Status: DC | PRN

## 2013-07-04 MED ORDER — BENZONATATE 100 MG PO CAPS: 100.0000 mg | ORAL_CAPSULE | Freq: Three times a day (TID) | ORAL | Status: DC | PRN

## 2013-07-04 NOTE — Assessment & Plan Note (Addendum)
Likely early viral bronchitis with mild associated bronchospasm, however will obtain CXR to exclude pneumonia.  Plan rx with abx if + for pneumonia or if symptoms worsen/do not improve.  Prescribed Albuterol Inhaler and Tessalon Pearls. Follow up in one week.

## 2013-07-04 NOTE — Telephone Encounter (Signed)
Order re-entered and faxed to [phone]. Notified pt.

## 2013-07-04 NOTE — Progress Notes (Signed)
Subjective:    Patient ID: Briana Miller, female    DOB: [birth year], 58 y.o.   MRN: [id number]  Cough Associated symptoms include chills, postnasal drip, rhinorrhea and shortness of breath. Pertinent negatives include no chest pain, ear pain or sore throat.   Briana Miller is a 58 year old female who presents today with a chief complaint of productive "deep"cough that started on Saturday afternoon with greenish sputum. Patient reports cough is worse at night. Patient also reports chills, sinus pressure, intermittent shortness of breath. Denies associated ear pain. Patient reports taking ibuprofen with mild, temporary relief.   Review of Systems  Constitutional: Positive for chills and fatigue.  HENT: Positive for postnasal drip, rhinorrhea and sinus pressure. Negative for ear pain and sore throat.   Respiratory: Positive for cough and shortness of breath.   Cardiovascular: Negative for chest pain.  Gastrointestinal: Positive for nausea. Negative for vomiting.       Reports mild nausea this AM that has resided.   Past Medical History  Diagnosis Date  . Arthritis   . GERD (gastroesophageal reflux disease)   . Overactive bladder     History   Social History  . Marital Status: Married    Spouse Name: N/A    Number of Children: 2  . Years of Education: N/A   Occupational History  .     Social History Main Topics  . Smoking status: Never Smoker   . Smokeless tobacco: Never Used  . Alcohol Use: 1.2 oz/week    2 Glasses of wine per week     Comment: None at present. Usually has 2 glasses wine daily  . Drug Use: No  . Sexual Activity: Not on file   Other Topics Concern  . 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 Knee arthroscopy      2007  . Total knee arthroplasty  09/22/2011    Procedure: TOTAL KNEE ARTHROPLASTY;  Surgeon: Raymon Mutton, MD;  Location: [facility] OR;  Service: Orthopedics;  Laterality: Right;  . Rotator cuff repair Right 03/23/13    Family History  Problem Relation Age of Onset  . Cancer Mother     colon 77 and breast 74- deceased  . Diabetes Father     living  . Asthma Father   . Hypertension Father   . Heart attack Father 90    medical management per pt  . Glaucoma Father     had eye transplant    No Known Allergies  Current Outpatient Prescriptions on File Prior to Visit  Medication Sig Dispense Refill  . calcium-vitamin D (OSCAL WITH D) 500-200 MG-UNIT per tablet Take 1 tablet by mouth daily.      . citalopram (CELEXA) 10 MG tablet Take 5 mg by mouth daily. PT ONLY TAKES 1/2 TABLET ONCE A WEEK.      . diclofenac (VOLTAREN) 50 MG EC tablet Take 50 mg by mouth 2 (two) times daily as needed.       . fluticasone (FLONASE) 50 MCG/ACT nasal spray Place 2 sprays into both nostrils daily.  16 g  6  . furosemide (LASIX) 20 MG tablet Take 1 tablet (20 mg total) by mouth daily.  30 tablet  12  . Glucosamine-Chondroit-Vit C-Mn (GLUCOSAMINE-CHONDROITIN) TABS Take 1 tablet by mouth daily.      Marland Kitchen. loratadine (CLARITIN) 10 MG tablet Take 1 tablet (10 mg total) by mouth daily.  30 tablet  11  . metoprolol succinate (TOPROL XL) 25 MG 24 hr tablet Take 1 tablet (25 mg total) by mouth daily.  30 tablet  12  . omeprazole (PRILOSEC) 40 MG capsule Take 1 capsule (40 mg total) by mouth daily.  30 capsule  3   No current facility-administered medications on file prior to visit.    BP 118/80  Pulse 78  Temp(Src) 97.7 F (36.5 C) (Oral)  Resp 16  Ht 5' 4.75" (1.645 m)  Wt 270 lb 1.3 oz (122.507 kg)  BMI 45.27 kg/m2  SpO2 98%  LMP 06/17/2007       Objective:   Physical Exam  Constitutional: She is oriented to person, place,  and time. She appears well-nourished.  HENT:  Head: Normocephalic.  Eyes: Pupils are equal, round, and reactive to light.  Neck: Neck supple.  Cardiovascular: Normal rate and regular rhythm.   Pulmonary/Chest: She has wheezes.  Expiratory wheezing to right posterior upper lobe.  Lymphadenopathy:    She has no cervical adenopathy.  Neurological: She is alert and oriented to person, place, and time.  Skin: Skin is warm and dry.          Assessment & Plan:  I have personally seen and examined patient and agree with Jerelyn CharlesKathryn Clark NP student's assessment and plan.

## 2013-07-04 NOTE — Patient Instructions (Signed)
Obtain x-ray in radiology downstairs prior to leaving. Start Albuterol and inhale 2 puffs every 4 to 6 hours as needed for wheezing/shortness of breath. Start Tessalon Pearls. Take 1 capsule three times daily as needed for cough. Follow up in one week.

## 2013-07-04 NOTE — Telephone Encounter (Signed)
The patient asked if she could have her chest xray orders sent to [facility] on [address]  She states she is unable to pay the copay they require downstairs.

## 2013-07-04 NOTE — Progress Notes (Signed)
Pre visit review using our clinic review tool, if applicable. No additional management support is needed unless otherwise documented below in the visit note. 

## 2013-07-06 ENCOUNTER — Ambulatory Visit: Payer: BC Managed Care – PPO | Admitting: Rehabilitation

## 2013-07-06 ENCOUNTER — Telehealth: Payer: Self-pay | Admitting: Family

## 2013-07-06 MED ORDER — AMOXICILLIN 500 MG PO CAPS: 500.0000 mg | ORAL_CAPSULE | Freq: Three times a day (TID) | ORAL | Status: DC

## 2013-07-06 MED ORDER — DOXYCYCLINE HYCLATE 100 MG PO TABS: 100.0000 mg | ORAL_TABLET | Freq: Two times a day (BID) | ORAL | Status: DC

## 2013-07-06 NOTE — Telephone Encounter (Signed)
Please call pt and let her know that I reviewed CXR shows possible pneumonia.  I would like her to start abx today and follow up in 1 week, sooner if symptoms worsen or do not improve over the next few days.

## 2013-07-06 NOTE — Telephone Encounter (Signed)
Notified pt and she voices understanding.  F/u scheduled for 07/13/13 at 10:30am.

## 2013-07-11 ENCOUNTER — Ambulatory Visit: Payer: BC Managed Care – PPO | Admitting: Physical Therapy

## 2013-07-11 ENCOUNTER — Other Ambulatory Visit: Payer: BC Managed Care – PPO | Admitting: Gastroenterology

## 2013-07-13 ENCOUNTER — Ambulatory Visit: Payer: BC Managed Care – PPO | Admitting: Physical Therapy

## 2013-07-13 ENCOUNTER — Encounter: Payer: Self-pay | Admitting: Cardiology

## 2013-07-13 ENCOUNTER — Encounter: Payer: Self-pay | Admitting: Family

## 2013-07-13 ENCOUNTER — Ambulatory Visit (INDEPENDENT_AMBULATORY_CARE_PROVIDER_SITE_OTHER): Payer: BC Managed Care – PPO | Admitting: Family

## 2013-07-13 ENCOUNTER — Ambulatory Visit (INDEPENDENT_AMBULATORY_CARE_PROVIDER_SITE_OTHER): Payer: BC Managed Care – PPO | Admitting: Cardiology

## 2013-07-13 VITALS — BP 134/78 | HR 81 | Temp 98.7°F | Resp 24 | Wt 273.0 lb

## 2013-07-13 VITALS — BP 124/80 | HR 74 | Ht 66.0 in | Wt 273.0 lb

## 2013-07-13 DIAGNOSIS — J189 Pneumonia, unspecified organism: Secondary | ICD-10-CM

## 2013-07-13 DIAGNOSIS — I428 Other cardiomyopathies: Secondary | ICD-10-CM

## 2013-07-13 DIAGNOSIS — I10 Essential (primary) hypertension: Secondary | ICD-10-CM

## 2013-07-13 DIAGNOSIS — I420 Dilated cardiomyopathy: Secondary | ICD-10-CM

## 2013-07-13 MED ORDER — BENZONATATE 100 MG PO CAPS: 100.0000 mg | ORAL_CAPSULE | Freq: Three times a day (TID) | ORAL | Status: DC | PRN

## 2013-07-13 NOTE — Progress Notes (Signed)
Subjective:    Patient ID: Briana Miller, female    DOB: Oct 10, 1955, 58 y.o.   MRN: [id number]  HPI  [name] is a 58 yr old female who presents today for 1 week follow up of her pneumonia. CXR showed possible pneumonia.  She was started on doxycyline and amoxicillin on 1/21/5. She has 3 more days left of her antibiotics.  Reports energy is improving, cough is improving. She reports occasional wheezing but this has improved. Denies sob or fever.    Review of Systems See HPI  Past Medical History  Diagnosis Date  . Arthritis   . GERD (gastroesophageal reflux disease)   . Overactive bladder   . Cardiomyopathy     History   Social History  . Marital Status: Married    Spouse Name: N/A    Number of Children: 2  . Years of Education: N/A   Occupational History  .     Social History Main Topics  . Smoking status: Never Smoker   . Smokeless tobacco: Never Used  . Alcohol Use: 1.2 oz/week    2 Glasses of wine per week     Comment: None at present. Usually has 2 glasses wine daily  . Drug Use: No  . Sexual Activity: Not on file   Other Topics Concern  . 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 Knee arthroscopy      2007  . Total knee arthroplasty  09/22/2011    Procedure: TOTAL KNEE ARTHROPLASTY;  Surgeon: Raymon Mutton, MD;  Location: [facility] OR;  Service: Orthopedics;  Laterality: Right;  . Rotator cuff repair Right 03/23/13    Family History  Problem Relation Age of Onset  . Cancer Mother     colon 99 and breast 35- deceased  . Diabetes Father     living  . Asthma Father   . Hypertension Father   . Heart attack Father 46    medical management  per pt  . Glaucoma Father     had eye transplant    No Known Allergies  Current Outpatient Prescriptions on File Prior to Visit  Medication Sig Dispense Refill  . albuterol (PROVENTIL HFA;VENTOLIN HFA) 108 (90 BASE) MCG/ACT inhaler Inhale 2 puffs into the lungs every 6 (six) hours as needed for wheezing or shortness of breath.  1 Inhaler  0  . amoxicillin (AMOXIL) 500 MG capsule Take 1 capsule (500 mg total) by mouth 3 (three) times daily.  30 capsule  0  . benzonatate (TESSALON) 100 MG capsule Take 1 capsule (100 mg total) by mouth 3 (three) times daily as needed for cough.  20 capsule  0  . calcium-vitamin D (OSCAL WITH D) 500-200 MG-UNIT per tablet Take 1 tablet by mouth daily.      . citalopram (CELEXA) 10 MG tablet Take 5 mg by mouth daily. PT ONLY TAKES 1/2 TABLET ONCE A WEEK.      . diclofenac (VOLTAREN) 50 MG EC tablet Take 50 mg by mouth 2 (two) times daily as needed.       . doxycycline (VIBRA-TABS)  100 MG tablet Take 1 tablet (100 mg total) by mouth 2 (two) times daily.  20 tablet  0  . fluticasone (FLONASE) 50 MCG/ACT nasal spray Place 2 sprays into both nostrils daily.  16 g  6  . furosemide (LASIX) 20 MG tablet Take 1 tablet (20 mg total) by mouth daily.  30 tablet  12  . Glucosamine-Chondroit-Vit C-Mn (GLUCOSAMINE-CHONDROITIN) TABS Take 1 tablet by mouth daily.      . metoprolol succinate (TOPROL XL) 25 MG 24 hr tablet Take 1 tablet (25 mg total) by mouth daily.  30 tablet  12  . omeprazole (PRILOSEC) 40 MG capsule Take 1 capsule (40 mg total) by mouth daily.  30 capsule  3   No current facility-administered medications on file prior to visit.    BP 134/78  Pulse 81  Temp(Src) 98.7 F (37.1 C) (Oral)  Resp 24  Wt 273 lb 0.6 oz (123.85 kg)  SpO2 99%  LMP 06/17/2007       Objective:   Physical Exam  Constitutional: She is oriented to person, place, and time. She appears well-developed and well-nourished. No distress.  Cardiovascular: Normal rate and regular  rhythm.   No murmur heard. Pulmonary/Chest: Effort normal and breath sounds normal. No respiratory distress. She has no wheezes. She has no rales. She exhibits no tenderness.  Neurological: She is alert and oriented to person, place, and time.  Psychiatric: She has a normal mood and affect. Her behavior is normal. Judgment and thought content normal.          Assessment & Plan:

## 2013-07-13 NOTE — Assessment & Plan Note (Signed)
Continue present blood pressure medications. 

## 2013-07-13 NOTE — Assessment & Plan Note (Addendum)
Previous echo showed ejection fraction of 40-45%. Etiology of cardiomyopathy unclear. TSH normal. No history of hypertension. Nuclear study is normal and therefore coronary disease unlikely. I have asked her to continue to avoid ETOH (2 drinks of liquor per day previously). Continue Toprol. Continue Lasix as needed. Her LV function low normal on nuclear study. Plan repeat echo in 6 months to one year.

## 2013-07-13 NOTE — Progress Notes (Signed)
HPI: FU cardiomyopathy. Echocardiogram in October of 2014 showed an ejection fraction of 40-45%, mild left ventricular hypertrophy and mild left atrial enlargement. Laboratories in October 2014 showed normal BUN, creatinine, hemoglobin and TSH.; BNP 55. Nuclear study in December of 2014 showed an ejection fraction of 51% and normal perfusion. When I last saw her we added Lasix and Toprol. Since then, the patient has dyspnea with more extreme activities but not with routine activities. It is relieved with rest. It is not associated with chest pain. There is no orthopnea, PND. There is no syncope or palpitations. There is no exertional chest pain. Occasional mild pedal edema which improved with Lasix as needed.     Current Outpatient Prescriptions  Medication Sig Dispense Refill  . albuterol (PROVENTIL HFA;VENTOLIN HFA) 108 (90 BASE) MCG/ACT inhaler Inhale 2 puffs into the lungs every 6 (six) hours as needed for wheezing or shortness of breath.  1 Inhaler  0  . amoxicillin (AMOXIL) 500 MG capsule Take 1 capsule (500 mg total) by mouth 3 (three) times daily.  30 capsule  0  . benzonatate (TESSALON) 100 MG capsule Take 1 capsule (100 mg total) by mouth 3 (three) times daily as needed for cough.  20 capsule  0  . calcium-vitamin D (OSCAL WITH D) 500-200 MG-UNIT per tablet Take 1 tablet by mouth daily.      . citalopram (CELEXA) 10 MG tablet Take 5 mg by mouth daily. PT ONLY TAKES 1/2 TABLET ONCE A WEEK.      . diclofenac (VOLTAREN) 50 MG EC tablet Take 50 mg by mouth 2 (two) times daily as needed.       . doxycycline (VIBRA-TABS) 100 MG tablet Take 1 tablet (100 mg total) by mouth 2 (two) times daily.  20 tablet  0  . fluticasone (FLONASE) 50 MCG/ACT nasal spray Place 2 sprays into both nostrils daily.  16 g  6  . furosemide (LASIX) 20 MG tablet Take 20 mg by mouth as needed.      . Glucosamine-Chondroit-Vit C-Mn (GLUCOSAMINE-CHONDROITIN) TABS Take 1 tablet by mouth daily.      . metoprolol  succinate (TOPROL XL) 25 MG 24 hr tablet Take 1 tablet (25 mg total) by mouth daily.  30 tablet  12   No current facility-administered medications for this visit.     Past Medical History  Diagnosis Date  . Arthritis   . GERD (gastroesophageal reflux disease)   . Overactive bladder   . Cardiomyopathy     Past Surgical History  Procedure Laterality Date  . Knee arthroscopy      2007  . Total knee arthroplasty  09/22/2011    Procedure: TOTAL KNEE ARTHROPLASTY;  Surgeon: Raymon Mutton, MD;  Location: [facility] OR;  Service: Orthopedics;  Laterality: Right;  . Rotator cuff repair Right 03/23/13    History   Social History  . Marital Status: Married    Spouse Name: N/A    Number of Children: 2  . Years of Education: N/A   Occupational History  .     Social History Main Topics  . Smoking status: Never Smoker   . Smokeless tobacco: Never Used  . Alcohol Use: 1.2 oz/week    2 Glasses of wine per week     Comment: None at present. Usually has 2 glasses wine daily  . Drug Use: No  . Sexual Activity: Not on file   Other Topics Concern  . 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 Remaining systems are negative.  Physical Exam: Well-developed obese in no acute distress.  Skin is warm and dry.  HEENT is normal.  Neck is supple.  Chest is clear to auscultation with normal expansion.  Cardiovascular exam is regular rate and rhythm.  Abdominal exam nontender or distended. No masses palpated. Extremities show trace edema. neuro  grossly intact  ECG sinus rhythm at a rate of 74. Nonspecific ST changes.

## 2013-07-13 NOTE — Patient Instructions (Signed)
Please complete antibiotics. Complete a follow up chest x ray in 2 weeks.  Follow up in 3 months, sooner if symptoms do not continue to improve.

## 2013-07-13 NOTE — Progress Notes (Signed)
Pre visit review using our clinic review tool, if applicable. No additional management support is needed unless otherwise documented below in the visit note. 

## 2013-07-13 NOTE — Patient Instructions (Signed)
Your physician wants you to follow-up in: 6 MONTHS WITH DR CRENSHAW IN [place] You will receive a reminder letter in the mail two months in advance. If you don't receive a letter, please call our office to schedule the follow-up appointment.  

## 2013-07-14 DIAGNOSIS — J189 Pneumonia, unspecified organism: Secondary | ICD-10-CM | POA: Insufficient documentation

## 2013-07-14 NOTE — Assessment & Plan Note (Signed)
Clinically resolving. Advised pt to complete antibiotics.  Follow up cxr in 2 weeks to ensure clearing.

## 2013-07-18 ENCOUNTER — Ambulatory Visit: Payer: BC Managed Care – PPO | Attending: Orthopedic Surgery | Admitting: Physical Therapy

## 2013-07-18 DIAGNOSIS — M6281 Muscle weakness (generalized): Secondary | ICD-10-CM | POA: Insufficient documentation

## 2013-07-18 DIAGNOSIS — M25519 Pain in unspecified shoulder: Secondary | ICD-10-CM | POA: Insufficient documentation

## 2013-07-18 DIAGNOSIS — R609 Edema, unspecified: Secondary | ICD-10-CM | POA: Insufficient documentation

## 2013-07-18 DIAGNOSIS — IMO0001 Reserved for inherently not codable concepts without codable children: Secondary | ICD-10-CM | POA: Insufficient documentation

## 2013-07-18 DIAGNOSIS — M25619 Stiffness of unspecified shoulder, not elsewhere classified: Secondary | ICD-10-CM | POA: Insufficient documentation

## 2013-07-21 ENCOUNTER — Ambulatory Visit: Payer: BC Managed Care – PPO | Admitting: Physical Therapy

## 2013-07-25 ENCOUNTER — Ambulatory Visit: Payer: BC Managed Care – PPO | Admitting: Rehabilitation

## 2013-07-28 ENCOUNTER — Ambulatory Visit: Payer: BC Managed Care – PPO | Admitting: Rehabilitation

## 2013-08-01 ENCOUNTER — Ambulatory Visit: Payer: BC Managed Care – PPO | Admitting: Rehabilitation

## 2013-08-04 ENCOUNTER — Ambulatory Visit: Payer: BC Managed Care – PPO | Admitting: Physical Therapy

## 2013-08-08 ENCOUNTER — Telehealth: Payer: Self-pay | Admitting: Family

## 2013-08-08 MED ORDER — OMEPRAZOLE 40 MG PO CPDR: 40.0000 mg | DELAYED_RELEASE_CAPSULE | Freq: Every day | ORAL | Status: DC

## 2013-08-08 NOTE — Telephone Encounter (Signed)
90 day request  omeprazole

## 2013-08-08 NOTE — Telephone Encounter (Signed)
OK to send 90 day refill please.

## 2013-08-08 NOTE — Telephone Encounter (Signed)
[name], it appears omeprazole was discontinued as "error" at her cardiology visit in January. I did not see mention in the office note that pt should stop the medication but it was removed from her med list.  Is it still ok to send refill?

## 2013-08-12 ENCOUNTER — Other Ambulatory Visit: Payer: Self-pay

## 2013-08-12 DIAGNOSIS — R079 Chest pain, unspecified: Secondary | ICD-10-CM

## 2013-08-12 MED ORDER — METOPROLOL SUCCINATE ER 25 MG PO TB24: 25.0000 mg | ORAL_TABLET | Freq: Every day | ORAL | Status: DC

## 2013-08-31 ENCOUNTER — Telehealth: Payer: Self-pay | Admitting: Cardiology

## 2013-08-31 NOTE — Telephone Encounter (Signed)
New message     C/o chest pain this am---want to be seen today

## 2013-08-31 NOTE — Telephone Encounter (Signed)
Called stating she had an episode of chest pain this AM.  She describes it as a "knot"  In her chest lasting only a few seconds.  States her BP was 176/110 HR 92.  States she hasn't taken her medication in 2 days. States she feels OK now. Spoke w/Dr. Jens Som.  He states that he feels like symptoms related to high blood pressure.  Pt does not have history of CAD and had a normal stress.  Advised to take her medication as ordered.  Pt advised.  She is requesting a note for her to be out of work today.  Dr. Jens Som approved and note written for her to be excused from work today.  She will pick up. Advised her to take BP this afternoon and to "stay quiet" today.  Advised if has any further symptoms to call back. She understands and will comply with taking her medications.

## 2013-11-28 ENCOUNTER — Telehealth: Payer: Self-pay | Admitting: Family

## 2013-11-28 NOTE — Telephone Encounter (Signed)
Call back attempted at 11:31 on 6-15, vmail left for Pt to f/u w/ office.

## 2013-11-30 ENCOUNTER — Telehealth: Payer: Self-pay | Admitting: Cardiology

## 2013-11-30 NOTE — Telephone Encounter (Signed)
Spoke with pt, she has a dark area on her lower right leg that has been present for about 3 weeks now. The area is wide and dark brown in color. There is a small spot that is warm, there are no strikes on the leg or foot. She has her usual swelling in that leg. Reassurance given to pt, does not sound like a blood clot. She was given the option of having a doppler tomorrow to r/o DVT. Pt reports she is going to f/u with melissa o'sullivan np instead. Pt to call back if there are changes. Pt agreed with this plan.

## 2013-11-30 NOTE — Telephone Encounter (Signed)
New Message:  Pt is c/o her right leg.. States she had a knee replacement a couple years ago... She states her lower leg and foot are discolored and she wonders if it could be blood clots.. PT also called to set up her recall appt.. Dr. Jens Som is booked til Sept... Pt want to be worked in for a sooner appt. Requests to speak with the nurse

## 2013-12-12 LAB — HM MAMMOGRAPHY

## 2014-01-02 ENCOUNTER — Telehealth: Payer: Self-pay | Admitting: Cardiology

## 2014-01-02 NOTE — Telephone Encounter (Signed)
Spoke with pt, she has noticed today that her legs are swollen and her thighs are even tight. She denies SOB or orthopnea. She took a furosemide today and did not notice a change. She will take another furosemide tomorrow morning and if no change she was given the okay to take another fluid pill at lunch. She will call back if swelling does not change.

## 2014-01-02 NOTE — Telephone Encounter (Signed)
New message    Patient calling C/O both legs are swollen.  One is more bigger than the other.

## 2014-01-10 ENCOUNTER — Encounter: Payer: Self-pay | Admitting: Cardiology

## 2014-01-10 NOTE — Telephone Encounter (Signed)
Left message for pt to call.

## 2014-01-10 NOTE — Telephone Encounter (Signed)
New message           C/O feet and ankles swollen

## 2014-01-13 ENCOUNTER — Other Ambulatory Visit: Payer: Self-pay | Admitting: Family

## 2014-01-13 NOTE — Telephone Encounter (Signed)
Proventil refill denied as pt was prescribed this in 06/2013 when seen for pneumonia. Note to pharmacy that pt should contact office first.

## 2014-01-26 NOTE — Telephone Encounter (Signed)
This encounter was created in error - please disregard.

## 2014-02-16 ENCOUNTER — Encounter: Payer: Self-pay | Admitting: Physician Assistant

## 2014-02-16 ENCOUNTER — Telehealth: Payer: Self-pay | Admitting: Cardiology

## 2014-02-16 ENCOUNTER — Ambulatory Visit (INDEPENDENT_AMBULATORY_CARE_PROVIDER_SITE_OTHER): Payer: BC Managed Care – PPO | Admitting: Physician Assistant

## 2014-02-16 VITALS — BP 135/70 | HR 93 | Temp 98.0°F | Wt 290.0 lb

## 2014-02-16 DIAGNOSIS — I872 Venous insufficiency (chronic) (peripheral): Secondary | ICD-10-CM

## 2014-02-16 DIAGNOSIS — M1710 Unilateral primary osteoarthritis, unspecified knee: Secondary | ICD-10-CM

## 2014-02-16 DIAGNOSIS — I8310 Varicose veins of unspecified lower extremity with inflammation: Secondary | ICD-10-CM

## 2014-02-16 DIAGNOSIS — M170 Bilateral primary osteoarthritis of knee: Secondary | ICD-10-CM

## 2014-02-16 MED ORDER — CEPHALEXIN 250 MG PO CAPS: 250.0000 mg | ORAL_CAPSULE | Freq: Four times a day (QID) | ORAL | Status: DC

## 2014-02-16 MED ORDER — NABUMETONE 500 MG PO TABS: 500.0000 mg | ORAL_TABLET | Freq: Two times a day (BID) | ORAL | Status: DC | PRN

## 2014-02-16 NOTE — Telephone Encounter (Signed)
Spoke with pt, she is c/o swelling in her feet and legs. She reports they never go down and now she has blisters on her legs. She is seeing her PCP today. She has SOB when walking far distance and occ will have SOB when lying flat. She only takes her furosemide on Saturday and Sunday because she is unable to go to the bathroom enough at work. F/u appt made with dr Jens Som 03-01-14. Will forward for dr Jens Som review

## 2014-02-16 NOTE — Assessment & Plan Note (Signed)
Mild. Bilateral. Increase Lasix to 40 mg daily x3 days. Then resume 20 mg daily. Elevate legs. Limit salt intake. Continue other medications as prescribed. Written prescription given for compression stockings, the patient to begin wearing once blistering has resolved. This should improve with removal of excess fluid. Patient given Rx for Keflex to take his blistering area becomes erythematous and tender. Followup in one week.

## 2014-02-16 NOTE — Patient Instructions (Signed)
Increase Lasix to 40 mg (two tablets) over the next 3 days.  Then resume normal dosing.  Elevate legs. Limit salt intake. Continue other medications as prescribed.  Wear compression stockings daily once this acute episode has resolved.  The blistering should resolve with resolution of the swelling.  If you notice any increased tenderness or redness around the site, begin taking Keflex as directed.  Follow-up with me in 1 week.  Take Relafen up to twice daily with food for osteoarthritis.  Further refills of this will have to come from you PCP, [name].

## 2014-02-16 NOTE — Telephone Encounter (Signed)
Briana Miller is a calling because her feet are swollen and now she has blisters on her (L) leg . Please call .Marland Kitchen   Thanks

## 2014-02-16 NOTE — Progress Notes (Signed)
Pre visit review using our clinic review tool, if applicable. No additional management support is needed unless otherwise documented below in the visit note. 

## 2014-02-16 NOTE — Progress Notes (Signed)
Patient presents to clinic today c/o "bubbling" region of skin on her lower extremities bilaterally. Assistant present for 2-3 weeks. Patient has a history of peripheral edema. Endorses increase in swelling. Denies dyspnea, palpitations or pleuritic chest pain. Denies recent trauma or injury. Has been taking her Lasix only occasionally. Patient denies fever, chills or malaise.  Past Medical History  Diagnosis Date  . Arthritis   . GERD (gastroesophageal reflux disease)   . Overactive bladder   . Cardiomyopathy     Current Outpatient Prescriptions on File Prior to Visit  Medication Sig Dispense Refill  . albuterol (PROVENTIL HFA;VENTOLIN HFA) 108 (90 BASE) MCG/ACT inhaler Inhale 2 puffs into the lungs every 6 (six) hours as needed for wheezing or shortness of breath.  1 Inhaler  0  . calcium-vitamin D (OSCAL WITH D) 500-200 MG-UNIT per tablet Take 1 tablet by mouth daily.      . fluticasone (FLONASE) 50 MCG/ACT nasal spray Place 2 sprays into both nostrils daily.  16 g  6  . furosemide (LASIX) 20 MG tablet Take 20 mg by mouth as needed.      . Glucosamine-Chondroit-Vit C-Mn (GLUCOSAMINE-CHONDROITIN) TABS Take 1 tablet by mouth daily.      . metoprolol succinate (TOPROL XL) 25 MG 24 hr tablet Take 1 tablet (25 mg total) by mouth daily.  90 tablet  3  . omeprazole (PRILOSEC) 40 MG capsule Take 1 capsule (40 mg total) by mouth daily.  90 capsule  1  . amoxicillin (AMOXIL) 500 MG capsule Take 1 capsule (500 mg total) by mouth 3 (three) times daily.  30 capsule  0  . benzonatate (TESSALON) 100 MG capsule Take 1 capsule (100 mg total) by mouth 3 (three) times daily as needed for cough.  20 capsule  0  . citalopram (CELEXA) 10 MG tablet Take 5 mg by mouth daily. PT ONLY TAKES 1/2 TABLET ONCE A WEEK.       No current facility-administered medications on file prior to visit.    No Known Allergies  Family History  Problem Relation Age of Onset  . Cancer Mother     colon 27 and breast 26- deceased   . Diabetes Father     living  . Asthma Father   . Hypertension Father   . Heart attack Father 88    medical management per pt  . Glaucoma Father     had eye transplant    History   Social History  . Marital Status: Married    Spouse Name: N/A    Number of Children: 2  . Years of Education: N/A   Occupational History  .     Social History Main Topics  . Smoking status: Never Smoker   . Smokeless tobacco: Never Used  . Alcohol Use: 1.2 oz/week    2 Glasses of wine per week     Comment: None at present. Usually has 2 glasses wine daily  . Drug Use: No  . Sexual Activity: None   Other Topics Concern  . 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  All other ROS are negative.  BP 135/70  Pulse 93  Temp(Src) 98 F (36.7 C)  Wt 290 lb (131.543 kg)  SpO2 99%  LMP 06/17/2007  Physical Exam  Vitals reviewed. Constitutional: She is oriented to person, place, and time and well-developed, well-nourished, and in no distress.  HENT:  Head: Normocephalic and atraumatic.  Eyes: Conjunctivae are normal.  Cardiovascular: Normal rate, regular rhythm, normal heart sounds and intact distal pulses.   Pulses:      Dorsalis pedis pulses are 2+ on the right side, and 2+ on the left side.       Posterior tibial pulses are 2+ on the right side, and 2+ on the left side.  1+ bilateral pitting edema of lower extremities up to the level of shins.  There is associated hyperpigmentation consistent with chronic venous stasis. There is a cluster of vesicles located on the shins bilaterally. No evidence of excoriation. This seems consistent with a venous stasis dermatitis.  Pulmonary/Chest: Effort normal and  breath sounds normal. No respiratory distress. She has no wheezes. She has no rales. She exhibits no tenderness.  Neurological: She is alert and oriented to person, place, and time.  Skin: Skin is warm and dry.  Psychiatric: Affect normal.   Assessment/Plan: Venous stasis dermatitis of both lower extremities Mild. Bilateral. Increase Lasix to 40 mg daily x3 days. Then resume 20 mg daily. Elevate legs. Limit salt intake. Continue other medications as prescribed. Written prescription given for compression stockings, the patient to begin wearing once blistering has resolved. This should improve with removal of excess fluid. Patient given Rx for Keflex to take his blistering area becomes erythematous and tender. Followup in one week.

## 2014-02-16 NOTE — Telephone Encounter (Signed)
Lasix 40 mg po x 1 and then 20 mg daily; bmet one week; fuov as scheduled Olga Millers

## 2014-02-17 NOTE — Telephone Encounter (Signed)
Spoke with Briana Miller, her furosemide was increased by her primary care yesterday. Patient voiced understanding of those instructions She is aware of the follow up with dr Jens Som

## 2014-02-27 ENCOUNTER — Telehealth: Payer: Self-pay | Admitting: *Deleted

## 2014-02-27 ENCOUNTER — Ambulatory Visit: Payer: BC Managed Care – PPO | Admitting: Family

## 2014-02-27 DIAGNOSIS — Z0289 Encounter for other administrative examinations: Secondary | ICD-10-CM

## 2014-02-27 NOTE — Telephone Encounter (Signed)
Please send no show letter

## 2014-02-27 NOTE — Telephone Encounter (Signed)
Pt did not show for appointment 02/27/2014 at 4:15 for 1 week follow up

## 2014-02-28 ENCOUNTER — Encounter: Payer: Self-pay | Admitting: Family

## 2014-02-28 NOTE — Telephone Encounter (Signed)
Mailed letter 02/28/2014.

## 2014-03-01 ENCOUNTER — Encounter: Payer: Self-pay | Admitting: *Deleted

## 2014-03-01 ENCOUNTER — Encounter: Payer: Self-pay | Admitting: Cardiology

## 2014-03-01 ENCOUNTER — Ambulatory Visit (INDEPENDENT_AMBULATORY_CARE_PROVIDER_SITE_OTHER): Payer: BC Managed Care – PPO | Admitting: Cardiology

## 2014-03-01 VITALS — BP 153/96 | HR 76 | Wt 299.0 lb

## 2014-03-01 DIAGNOSIS — I420 Dilated cardiomyopathy: Secondary | ICD-10-CM

## 2014-03-01 DIAGNOSIS — I5021 Acute systolic (congestive) heart failure: Secondary | ICD-10-CM

## 2014-03-01 DIAGNOSIS — I10 Essential (primary) hypertension: Secondary | ICD-10-CM

## 2014-03-01 DIAGNOSIS — I509 Heart failure, unspecified: Secondary | ICD-10-CM

## 2014-03-01 DIAGNOSIS — R079 Chest pain, unspecified: Secondary | ICD-10-CM

## 2014-03-01 DIAGNOSIS — I428 Other cardiomyopathies: Secondary | ICD-10-CM

## 2014-03-01 HISTORY — DX: Acute systolic (congestive) heart failure: I50.21

## 2014-03-01 MED ORDER — METOPROLOL SUCCINATE ER 50 MG PO TB24: 50.0000 mg | ORAL_TABLET | Freq: Every day | ORAL | Status: DC

## 2014-03-01 MED ORDER — LISINOPRIL 10 MG PO TABS: 10.0000 mg | ORAL_TABLET | Freq: Every day | ORAL | Status: DC

## 2014-03-01 MED ORDER — FUROSEMIDE 20 MG PO TABS: 40.0000 mg | ORAL_TABLET | Freq: Every day | ORAL | Status: DC

## 2014-03-01 NOTE — Assessment & Plan Note (Signed)
Possibly secondary to hypertension. Continue Toprol but increase dose to 50 mg daily. Add lisinopril 10 mg daily. Titrate medications to control blood pressure. Repeat echocardiogram.

## 2014-03-01 NOTE — Assessment & Plan Note (Signed)
Blood pressure is elevated. Increase Toprol to 50 mg daily. Add lisinopril 10 mg daily. Increase Lasix to 40 mg daily. Further adjustments based on followup readings.

## 2014-03-01 NOTE — Progress Notes (Signed)
HPI: FU cardiomyopathy. Echocardiogram in October of 2014 showed an ejection fraction of 40-45%, mild left ventricular hypertrophy and mild left atrial enlargement. Laboratories in October 2014 showed normal BUN, creatinine, hemoglobin and TSH.; BNP 55. Nuclear study in December of 2014 showed an ejection fraction of 51% and normal perfusion. Since I last saw her, She has noticed mild increased dyspnea on exertion and occasional orthopnea. In the past one month she has had bilateral lower extremity edema. She occasionally feels a brief sharp chest pain but no exertional symptoms. No syncope.   Current Outpatient Prescriptions  Medication Sig Dispense Refill  . albuterol (PROVENTIL HFA;VENTOLIN HFA) 108 (90 BASE) MCG/ACT inhaler Inhale 2 puffs into the lungs every 6 (six) hours as needed for wheezing or shortness of breath.  1 Inhaler  0  . calcium-vitamin D (OSCAL WITH D) 500-200 MG-UNIT per tablet Take 1 tablet by mouth daily.      . fluticasone (FLONASE) 50 MCG/ACT nasal spray Place 2 sprays into both nostrils daily.  16 g  6  . furosemide (LASIX) 20 MG tablet Take 20 mg by mouth as needed.      . Glucosamine-Chondroit-Vit C-Mn (GLUCOSAMINE-CHONDROITIN) TABS Take 1 tablet by mouth daily.      . metoprolol succinate (TOPROL XL) 25 MG 24 hr tablet Take 1 tablet (25 mg total) by mouth daily.  90 tablet  3  . nabumetone (RELAFEN) 500 MG tablet Take 1 tablet (500 mg total) by mouth 2 (two) times daily as needed.  60 tablet  0  . omeprazole (PRILOSEC) 40 MG capsule Take 1 capsule (40 mg total) by mouth daily.  90 capsule  1   No current facility-administered medications for this visit.     Past Medical History  Diagnosis Date  . Arthritis   . GERD (gastroesophageal reflux disease)   . Overactive bladder   . Cardiomyopathy     Past Surgical History  Procedure Laterality Date  . Knee arthroscopy      2007  . Total knee arthroplasty  09/22/2011    Procedure: TOTAL KNEE ARTHROPLASTY;   Surgeon: Raymon Mutton, MD;  Location: [facility] OR;  Service: Orthopedics;  Laterality: Right;  . Rotator cuff repair Right 03/23/13    History   Social History  . Marital Status: Married    Spouse Name: N/A    Number of Children: 2  . Years of Education: N/A   Occupational History  .     Social History Main Topics  . Smoking status: Never Smoker   . Smokeless tobacco: Never Used  . Alcohol Use: 1.2 oz/week    2 Glasses of wine per week     Comment: None at present. Usually has 2 glasses wine daily  . Drug Use: No  . Sexual Activity: Not on file   Other Topics Concern  . 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 Remaining systems are negative.  Physical Exam: Well-developed morbidly obese in no acute distress.  Skin is warm and dry.  HEENT is normal.  Neck is supple.  Chest is clear to auscultation with normal expansion.  Cardiovascular exam is regular rate and rhythm.  Abdominal exam nontender or distended. No masses palpated. Extremities show 2+ edema. neuro grossly intact  ECG Sinus rhythm at a rate of 76. Nonspecific ST changes.

## 2014-03-01 NOTE — Assessment & Plan Note (Addendum)
Patient is volume overloaded. She is only taking Lasix 20 mg once weekly. Change to 40 mg daily. Check potassium and renal function in one week. Patient instructed on low sodium diet and fluid restriction.

## 2014-03-01 NOTE — Patient Instructions (Signed)
Your physician recommends that you schedule a follow-up appointment in: 4-6 WEEKS WITH DR CRENSHAW  INCREASE FUROSEMIDE TO 40 MG ONCE DAILY= 2 20 MG TABLETS ONCE DAILY  Your physician recommends that you return for LAB WORK IN ONE WEEK  INCREASE METOPROLOL TO 50 MG ONCE DAILY=2 25 MG TABLETS ONCE DAILY  START LISINOPRIL 10 MG ONCE DAILY  Your physician has requested that you have an echocardiogram. Echocardiography is a painless test that uses sound waves to create images of your heart. It provides your doctor with information about the size and shape of your heart and how well your heart's chambers and valves are working. This procedure takes approximately one hour. There are no restrictions for this procedure.   REFERRAL TO PULMONARY FOR SLEEP APNEA

## 2014-03-01 NOTE — Assessment & Plan Note (Signed)
Discussed weight loss. Will refer to pulmonary for evaluation of sleep apnea.

## 2014-03-09 ENCOUNTER — Institutional Professional Consult (permissible substitution): Payer: BC Managed Care – PPO | Admitting: Pulmonary Disease

## 2014-03-09 ENCOUNTER — Ambulatory Visit (HOSPITAL_COMMUNITY)
Admission: RE | Admit: 2014-03-09 | Discharge: 2014-03-09 | Disposition: A | Discharge status: Home / Self Care | Payer: BC Managed Care – PPO | Source: Ambulatory Visit | Attending: Family | Admitting: Family

## 2014-03-09 ENCOUNTER — Other Ambulatory Visit (HOSPITAL_COMMUNITY): Payer: Self-pay | Admitting: Family

## 2014-03-09 DIAGNOSIS — R0602 Shortness of breath: Secondary | ICD-10-CM

## 2014-03-09 DIAGNOSIS — I428 Other cardiomyopathies: Secondary | ICD-10-CM | POA: Insufficient documentation

## 2014-03-09 DIAGNOSIS — K219 Gastro-esophageal reflux disease without esophagitis: Secondary | ICD-10-CM | POA: Diagnosis not present

## 2014-03-09 DIAGNOSIS — R609 Edema, unspecified: Secondary | ICD-10-CM | POA: Diagnosis not present

## 2014-03-09 DIAGNOSIS — I420 Dilated cardiomyopathy: Secondary | ICD-10-CM

## 2014-03-09 DIAGNOSIS — R079 Chest pain, unspecified: Secondary | ICD-10-CM | POA: Insufficient documentation

## 2014-03-09 DIAGNOSIS — I369 Nonrheumatic tricuspid valve disorder, unspecified: Secondary | ICD-10-CM

## 2014-03-09 NOTE — Progress Notes (Signed)
2D Echocardiogram Complete.  03/09/2014   Kaeleen Odom, RDCS  

## 2014-03-10 ENCOUNTER — Inpatient Hospital Stay (HOSPITAL_COMMUNITY): Admission: RE | Admit: 2014-03-10 | Payer: BC Managed Care – PPO | Source: Ambulatory Visit

## 2014-04-06 ENCOUNTER — Ambulatory Visit (HOSPITAL_BASED_OUTPATIENT_CLINIC_OR_DEPARTMENT_OTHER)
Admission: RE | Admit: 2014-04-06 | Discharge: 2014-04-06 | Disposition: A | Discharge status: Home / Self Care | Payer: BC Managed Care – PPO | Source: Ambulatory Visit | Attending: Pulmonary Disease | Admitting: Pulmonary Disease

## 2014-04-06 ENCOUNTER — Encounter: Payer: Self-pay | Admitting: Pulmonary Disease

## 2014-04-06 ENCOUNTER — Ambulatory Visit (INDEPENDENT_AMBULATORY_CARE_PROVIDER_SITE_OTHER): Payer: BC Managed Care – PPO | Admitting: Pulmonary Disease

## 2014-04-06 VITALS — BP 112/75 | HR 71 | Temp 98.6°F | Ht 65.5 in | Wt 290.5 lb

## 2014-04-06 DIAGNOSIS — R05 Cough: Secondary | ICD-10-CM | POA: Diagnosis not present

## 2014-04-06 DIAGNOSIS — R059 Cough, unspecified: Secondary | ICD-10-CM

## 2014-04-06 DIAGNOSIS — G4733 Obstructive sleep apnea (adult) (pediatric): Secondary | ICD-10-CM

## 2014-04-06 NOTE — Patient Instructions (Signed)
You likely have obstructive sleep apnea  Schedule sleep study Your cough may be related to medication - STOP taking lisinopril  CXR today Take lasix daily -after work

## 2014-04-06 NOTE — Progress Notes (Signed)
Subjective:    Patient ID: Briana Miller, female    DOB: 1956-03-07, 58 y.o.   MRN: [id number]  HPI  58 year old woman with diastolic heart failure presents for evaluation of sleep disordered breathing. Loud snoring and apneas have been witnessed by her husband. She reports excessive daytime somnolence, Epworth sleepiness score is 20 Bedtime is around 10 PM, she sleeps without the preferred position with one pillow, reports 4-5 awakenings including bathroom visits and cough, and is out of bed by 4 AM feeling tired with dryness of mouth and occasional headache. She has a short drive to work by 6 AM. There is no history suggestive of cataplexy, sleep paralysis or parasomnias She denies use of excessive caffeinated beverages. She was started on lisinopril about a month ago, and reports onset of cough around that time. 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 Arthritis   . GERD (gastroesophageal reflux disease)   . Overactive bladder   . Cardiomyopathy     Past Surgical History  Procedure Laterality Date  . Knee arthroscopy      2007  . Total knee arthroplasty  09/22/2011    Procedure: TOTAL KNEE ARTHROPLASTY;  Surgeon: Raymon Mutton, MD;  Location: [facility] OR;  Service: Orthopedics;  Laterality: Right;  . Rotator cuff repair Right 03/23/13    No Known Allergies  History   Social History  . Marital Status: Married    Spouse Name: N/A    Number of Children: 2  . Years of Education: N/A   Occupational History  .     Social History Main Topics  . Smoking status: Never Smoker   . Smokeless tobacco: Never Used  . Alcohol Use: 1.2 oz/week    2 Glasses of wine per week     Comment: None at present. Usually has 2 glasses wine  daily  . Drug Use: No  . Sexual Activity: Not on file   Other Topics Concern  . 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 Cancer Mother     colon 30 and breast 37- deceased  . Diabetes Father     living  . Asthma Father   . Hypertension Father   . Heart attack Father 71    medical management per pt  . Glaucoma Father     had eye transplant       Review of Systems  Constitutional: Negative for fever and unexpected weight change.  HENT: Positive for trouble swallowing. Negative for congestion, dental problem, ear pain, nosebleeds, postnasal drip, rhinorrhea, sinus pressure, sneezing and sore throat.   Eyes: Negative for redness and itching.  Respiratory: Positive for cough and shortness of breath. Negative for chest tightness and wheezing.   Cardiovascular: Positive for chest pain and leg swelling. Negative for palpitations.  Gastrointestinal: Positive for abdominal pain. Negative for nausea  and vomiting.  Genitourinary: Negative for dysuria.  Musculoskeletal: Negative for joint swelling.  Skin: Negative for rash.  Neurological: Negative for headaches.  Hematological: Does not bruise/bleed easily.  Psychiatric/Behavioral: Negative for dysphoric mood. The patient is not nervous/anxious.        Objective:   Physical Exam  Gen. Pleasant, obese, in no distress, normal affect ENT - no lesions, no post nasal drip, class 2-3 airway Neck: No JVD, no thyromegaly, no carotid bruits Lungs: no use of accessory muscles, no dullness to percussion, decreased without rales or rhonchi  Cardiovascular: Rhythm regular, heart sounds  normal, no murmurs or gallops, no peripheral  edema Abdomen: soft and non-tender, no hepatosplenomegaly, BS normal. Musculoskeletal: No deformities, no cyanosis or clubbing Neuro:  alert, non focal, no tremors       Assessment & Plan:

## 2014-04-06 NOTE — Assessment & Plan Note (Addendum)
Given excessive daytime somnolence, narrow pharyngeal exam, witnessed apneas & loud snoring, obstructive sleep apnea is very likely & an overnight polysomnogram will be scheduled as a split study. The pathophysiology of obstructive sleep apnea , it's cardiovascular consequences & modes of treatment including CPAP were discused with the patient in detail & they evidenced understanding.  Since she has diastolic heart failure, would prefer an attended polysomnogram

## 2014-04-06 NOTE — Assessment & Plan Note (Signed)
Your cough may be related to medication - STOP taking lisinopril  CXR today Take lasix daily -after work

## 2014-04-07 ENCOUNTER — Telehealth: Payer: Self-pay | Admitting: Pulmonary Disease

## 2014-04-07 NOTE — Telephone Encounter (Signed)
Per CXR results: Result Note     No evidence of pna   I spoke with patient about results and she verbalized understanding and had no questions

## 2014-04-10 ENCOUNTER — Telehealth: Payer: Self-pay

## 2014-04-10 NOTE — Telephone Encounter (Signed)
Message copied by Donata Duff on Mon Apr 10, 2014  8:50 AM ------      Message from: Rachael Fee      Created: Fri Apr 07, 2014  7:15 AM       Adan Sis her a year ago for the same ([place] neg, HIDA neg, LFTs neg).  We'll get her back in sooner.  Thanks.            Sylvester Minton,       can you contact her for rov with me, double book for PM November 2nd (monday).  Thanks.                  ----- Message -----         From: Oretha Milch, MD         Sent: 04/06/2014   4:53 PM           To: Rachael Fee, MD            Wants earlier than December appt?      Has ruq pain ? Gall bladder      Can you see?            Kathy Breach       ------

## 2014-04-10 NOTE — Telephone Encounter (Addendum)
ROV 04/17/14 230 pm  Pt is aware

## 2014-04-13 ENCOUNTER — Telehealth: Payer: Self-pay | Admitting: Pulmonary Disease

## 2014-04-13 NOTE — Telephone Encounter (Signed)
lmomtcb x1 for pt 

## 2014-04-13 NOTE — Telephone Encounter (Signed)
Lisinopril may be causing the cough- She can talk with her PCP about an alternative  Try Zyrtec at night for postnasal drip  Try increasing lasix to 2 x 20 mg daily for next 3 days, to see if it reduces leg swelling, and start wearing support stockings to get the fluid out of the legs.

## 2014-04-13 NOTE — Telephone Encounter (Signed)
Called and spoke to pt. Pt c/o prod cough with clear mucus that is keeping her up at night and also an increase in SOB. Pt states she had one episode of chest "throbbing" on 04/10/14 that didn't last long; pt took 2 asa 81mg  and has not experienced the discomfort since. Pt also c/o mild BIL lower extremity edema that presented 2 days ago. Pt stated she has taken dayquil and alka seltzer cold and sinus without relief. Pt denies f/c/s. RA is off this afternoon-will send to doc of day.  CY please advise.   No Known Allergies  Current Outpatient Prescriptions on File Prior to Visit  Medication Sig Dispense Refill  . albuterol (PROVENTIL HFA;VENTOLIN HFA) 108 (90 BASE) MCG/ACT inhaler Inhale 2 puffs into the lungs every 6 (six) hours as needed for wheezing or shortness of breath.  1 Inhaler  0  . calcium-vitamin D (OSCAL WITH D) 500-200 MG-UNIT per tablet Take 1 tablet by mouth daily.      . diclofenac (VOLTAREN) 50 MG EC tablet Take 1 tablet by mouth 2 (two) times daily.      . fluticasone (FLONASE) 50 MCG/ACT nasal spray Place 2 sprays into both nostrils daily.  16 g  6  . furosemide (LASIX) 20 MG tablet Take 2 tablets (40 mg total) by mouth daily.  180 tablet  3  . Glucosamine-Chondroit-Vit C-Mn (GLUCOSAMINE-CHONDROITIN) TABS Take 1 tablet by mouth daily.      Marland Kitchen lisinopril (PRINIVIL,ZESTRIL) 10 MG tablet Take 1 tablet (10 mg total) by mouth daily.  90 tablet  3  . metoprolol succinate (TOPROL XL) 50 MG 24 hr tablet Take 1 tablet (50 mg total) by mouth daily.  90 tablet  3  . nabumetone (RELAFEN) 500 MG tablet Take 1 tablet (500 mg total) by mouth 2 (two) times daily as needed.  60 tablet  0  . omeprazole (PRILOSEC) 40 MG capsule Take 1 capsule (40 mg total) by mouth daily.  90 capsule  1   No current facility-administered medications on file prior to visit.

## 2014-04-14 DIAGNOSIS — M1712 Unilateral primary osteoarthritis, left knee: Secondary | ICD-10-CM | POA: Insufficient documentation

## 2014-04-14 NOTE — Telephone Encounter (Signed)
Patient calling back.   °

## 2014-04-14 NOTE — Telephone Encounter (Signed)
Called pt. Aware of recs

## 2014-04-14 NOTE — Telephone Encounter (Signed)
Pt returning call.Briana Miller ° °

## 2014-04-14 NOTE — Telephone Encounter (Signed)
lmomtcb x1 

## 2014-04-17 ENCOUNTER — Other Ambulatory Visit (INDEPENDENT_AMBULATORY_CARE_PROVIDER_SITE_OTHER): Payer: BC Managed Care – PPO

## 2014-04-17 ENCOUNTER — Ambulatory Visit (INDEPENDENT_AMBULATORY_CARE_PROVIDER_SITE_OTHER): Payer: BC Managed Care – PPO | Admitting: Gastroenterology

## 2014-04-17 ENCOUNTER — Encounter: Payer: Self-pay | Admitting: Gastroenterology

## 2014-04-17 VITALS — BP 124/74 | HR 76 | Ht 65.5 in | Wt 289.2 lb

## 2014-04-17 DIAGNOSIS — R1013 Epigastric pain: Secondary | ICD-10-CM

## 2014-04-17 LAB — CBC WITH DIFFERENTIAL/PLATELET
Basophils Absolute: 0 10*3/uL (ref 0.0–0.1)
Basophils Relative: 0.4 % (ref 0.0–3.0)
EOS ABS: 0.1 10*3/uL (ref 0.0–0.7)
Eosinophils Relative: 0.7 % (ref 0.0–5.0)
HEMATOCRIT: 37.9 % (ref 36.0–46.0)
Hemoglobin: 12.4 g/dL (ref 12.0–15.0)
Lymphocytes Relative: 23.4 % (ref 12.0–46.0)
Lymphs Abs: 2.7 10*3/uL (ref 0.7–4.0)
MCHC: 32.8 g/dL (ref 30.0–36.0)
MCV: 86.5 fl (ref 78.0–100.0)
MONO ABS: 1.2 10*3/uL — AB (ref 0.1–1.0)
Monocytes Relative: 10.6 % (ref 3.0–12.0)
Neutro Abs: 7.4 10*3/uL (ref 1.4–7.7)
Neutrophils Relative %: 64.9 % (ref 43.0–77.0)
PLATELETS: 304 10*3/uL (ref 150.0–400.0)
RBC: 4.38 Mil/uL (ref 3.87–5.11)
RDW: 13.8 % (ref 11.5–15.5)
WBC: 11.4 10*3/uL — ABNORMAL HIGH (ref 4.0–10.5)

## 2014-04-17 LAB — COMPREHENSIVE METABOLIC PANEL
ALK PHOS: 76 U/L (ref 39–117)
ALT: 20 U/L (ref 0–35)
AST: 16 U/L (ref 0–37)
Albumin: 3.7 g/dL (ref 3.5–5.2)
BILIRUBIN TOTAL: 0.3 mg/dL (ref 0.2–1.2)
BUN: 22 mg/dL (ref 6–23)
CO2: 26 mEq/L (ref 19–32)
Calcium: 9.2 mg/dL (ref 8.4–10.5)
Chloride: 107 mEq/L (ref 96–112)
Creatinine, Ser: 1.1 mg/dL (ref 0.4–1.2)
GFR: 68.89 mL/min (ref >60.00)
Glucose, Bld: 135 mg/dL — ABNORMAL HIGH (ref 70–99)
Potassium: 4 mEq/L (ref 3.5–5.1)
SODIUM: 141 meq/L (ref 135–145)
TOTAL PROTEIN: 7.6 g/dL (ref 6.0–8.3)

## 2014-04-17 NOTE — Progress Notes (Signed)
Review of pertinent gastrointestinal problems: 1. Family history of colon cancer; mother in her early 82s; colonoscopy Dr. Christella Hartigan 05/2013 found diverticulosis, no polyps, recommended recall at 5 years. 2. RUQ pains: clinically seemed biliary-like; 05/2013 [place] [facility] was normal; 05/2013 HIDA scan with GB EF was normal.05/2013 LFTs were normal.  Was recommended, scheduled for EGD but she cancelled it.  HPI: This is a   Very pleasant, morbidly obese 58 year old woman whom I last saw about one year ago.  She's been having epigastric pains.    Recently getting a cortisone shot in leg.  Has had right shoulder discomfort.  Having a lot of belching, has epigastric pains intermittently.  A lot of flatulence.  Takes PPI (prilosec, sometimes 4-5 per day).  Pain in epigastrium can last for about an hour.  Takes alleve periodically.  Takes tylenol usually daily.  Up 16 pounds in past year (same scale here in GI office)  DRinks one coffee daily.  No etoh.  No dypshagia.  Past Medical History  Diagnosis Date  . Arthritis   . GERD (gastroesophageal reflux disease)   . Overactive bladder   . Cardiomyopathy     Past Surgical History  Procedure Laterality Date  . Knee arthroscopy      2007  . Total knee arthroplasty  09/22/2011    Procedure: TOTAL KNEE ARTHROPLASTY;  Surgeon: Raymon Mutton, MD;  Location: [facility] OR;  Service: Orthopedics;  Laterality: Right;  . Rotator cuff repair Right 03/23/13    Current Outpatient Prescriptions  Medication Sig Dispense Refill  . albuterol (PROVENTIL HFA;VENTOLIN HFA) 108 (90 BASE) MCG/ACT inhaler Inhale 2 puffs into the lungs every 6 (six) hours as needed for wheezing or shortness of breath. 1 Inhaler 0  . calcium-vitamin D (OSCAL WITH D) 500-200 MG-UNIT per tablet Take 1 tablet by mouth daily.    . diclofenac (VOLTAREN) 50 MG EC tablet Take 1 tablet by mouth 2 (two) times daily.    . furosemide (LASIX) 20 MG tablet Take 2 tablets (40 mg total) by mouth  daily. 180 tablet 3  . Glucosamine-Chondroit-Vit C-Mn (GLUCOSAMINE-CHONDROITIN) TABS Take 1 tablet by mouth daily.    . metoprolol succinate (TOPROL XL) 50 MG 24 hr tablet Take 1 tablet (50 mg total) by mouth daily. 90 tablet 3  . omeprazole (PRILOSEC) 40 MG capsule Take 1 capsule (40 mg total) by mouth daily. 90 capsule 1  . VOLTAREN 1 % GEL   1   No current facility-administered medications for this visit.    Allergies as of 04/17/2014  . (No Known Allergies)    Family History  Problem Relation Age of Onset  . Cancer Mother     colon 66 and breast 26- deceased  . Diabetes Father     living  . Asthma Father   . Hypertension Father   . Heart attack Father 12    medical management per pt  . Glaucoma Father     had eye transplant    History   Social History  . Marital Status: Married    Spouse Name: N/A    Number of Children: 2  . Years of Education: N/A   Occupational History  .     Social History Main Topics  . Smoking status: Never Smoker   . Smokeless tobacco: Never Used  . Alcohol Use: 1.2 oz/week    2 Glasses of wine per week     Comment: None at present. Usually has 2 glasses wine daily  . Drug  Use: No  . Sexual Activity: Not on file   Other Topics Concern  . 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 female with morbid obesity, intermittent  epigastric discomforts, bloating  I suspect her morbid obesity may have a lot to do with her intermittent epigastric discomforts. She has been checked for gallstones, gallbladder dyskinesia. I previously recommended upper endoscopy for her for some abdominal pains and she canceled the appointment, today she is more agreeable to going ahead with that test which I do think is the next step in evaluation here. She will also have basic set of labs including a CBC, complete metabolic profile.

## 2014-04-17 NOTE — Patient Instructions (Signed)
You will be set up for an upper endoscopy for epigastric pain. Losing weight may help. You will have labs checked today in the basement lab.  Please head down after you check out with the front desk  (cmet, cbc).

## 2014-04-19 ENCOUNTER — Encounter: Payer: Self-pay | Admitting: Cardiology

## 2014-04-19 ENCOUNTER — Ambulatory Visit (INDEPENDENT_AMBULATORY_CARE_PROVIDER_SITE_OTHER): Payer: BC Managed Care – PPO | Admitting: Cardiology

## 2014-04-19 VITALS — BP 142/88 | HR 74 | Ht 65.5 in | Wt 290.0 lb

## 2014-04-19 DIAGNOSIS — I420 Dilated cardiomyopathy: Secondary | ICD-10-CM

## 2014-04-19 DIAGNOSIS — I10 Essential (primary) hypertension: Secondary | ICD-10-CM

## 2014-04-19 DIAGNOSIS — I5021 Acute systolic (congestive) heart failure: Secondary | ICD-10-CM

## 2014-04-19 MED ORDER — LOSARTAN POTASSIUM 50 MG PO TABS: 50.0000 mg | ORAL_TABLET | Freq: Every day | ORAL | Status: DC

## 2014-04-19 NOTE — Assessment & Plan Note (Signed)
LV function improved on most recent echocardiogram. I think this is most likely related to high blood pressure. Continue beta blocker. ACE inhibitor caused cough. This has been discontinued. Add Cozaar 50 mg daily. Check potassium and renal function in 1 week. Titrate medications as needed for blood pressure control.

## 2014-04-19 NOTE — Patient Instructions (Signed)
Your physician recommends that you schedule a follow-up appointment in: 3 MONTHS WITH DR CRENSHAW  START LOSARTAN 50 MG ONCE DAILY  Your physician recommends that you return for lab work in: ONE WEEK IN THE [place] [facility]

## 2014-04-19 NOTE — Assessment & Plan Note (Signed)
Patient's 5 status is mildly improved. She is not taking her Lasix on Monday Tuesday and Wednesday. I have encouraged her to take her medications as described. I discussed weight loss, low-sodium diet and fluid restriction.

## 2014-04-19 NOTE — Progress Notes (Signed)
HPI: FU cardiomyopathy. Echocardiogram in October of 2014 showed an ejection fraction of 40-45%, mild left ventricular hypertrophy and mild left atrial enlargement. Laboratories in October 2014 showed normal BUN, creatinine, hemoglobin and TSH.; BNP 55. Nuclear study in December of 2014 showed an ejection fraction of 51% and normal perfusion. Echocardiogram repeated September 2015 and showed an ejection fraction of 50-55%. Mild left atrial enlargement. When I last saw her her blood pressure was elevated and she was volume overloaded. I increased her lisinopril, Toprol and Lasix. Since I last saw her, She has some dyspnea on exertion but improved. No orthopnea or PND. Mild pedal edema. No chest pain.  Current Outpatient Prescriptions  Medication Sig Dispense Refill  . albuterol (PROVENTIL HFA;VENTOLIN HFA) 108 (90 BASE) MCG/ACT inhaler Inhale 2 puffs into the lungs every 6 (six) hours as needed for wheezing or shortness of breath. 1 Inhaler 0  . calcium-vitamin D (OSCAL WITH D) 500-200 MG-UNIT per tablet Take 1 tablet by mouth daily.    . diclofenac (VOLTAREN) 50 MG EC tablet Take 1 tablet by mouth 2 (two) times daily.    . furosemide (LASIX) 20 MG tablet Take 2 tablets (40 mg total) by mouth daily. (Patient taking differently: Take 40 mg by mouth. Take two tablets on Saturday and Sunday) 180 tablet 3  . Glucosamine-Chondroit-Vit C-Mn (GLUCOSAMINE-CHONDROITIN) TABS Take 1 tablet by mouth daily.    . metoprolol succinate (TOPROL XL) 50 MG 24 hr tablet Take 1 tablet (50 mg total) by mouth daily. 90 tablet 3  . omeprazole (PRILOSEC) 40 MG capsule Take 1 capsule (40 mg total) by mouth daily. 90 capsule 1  . VOLTAREN 1 % GEL   1   No current facility-administered medications for this visit.     Past Medical History  Diagnosis Date  . Arthritis   . GERD (gastroesophageal reflux disease)   . Overactive bladder   . Cardiomyopathy     Past Surgical History  Procedure Laterality Date  .  Knee arthroscopy      20 07  . Total knee arthroplasty  09/22/2011    Procedure: TOTAL KNEE ARTHROPLASTY;  Surgeon: Raymon Mutton, MD;  Location: [facility] OR;  Service: Orthopedics;  Laterality: Right;  . Rotator cuff repair Right 03/23/13    History   Social History  . Marital Status: Married    Spouse Name: N/A    Number of Children: 2  . Years of Education: N/A   Occupational History  .     Social History Main Topics  . Smoking status: Never Smoker   . Smokeless tobacco: Never Used  . Alcohol Use: 1.2 oz/week    2 Glasses of wine per week     Comment: None at present. Usually has 2 glasses wine daily  . Drug Use: No  . Sexual Activity: Not on file   Other Topics Concern  . 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 Remaining systems are negative.  Physical Exam: Well-developed obese in no  acute distress.  Skin is warm and dry.  HEENT is normal.  Neck is supple.  Chest is clear to auscultation with normal expansion.  Cardiovascular exam is regular rate and rhythm.  Abdominal exam nontender or distended. No masses palpated. Extremities show n1+ edema. neuro grossly intact

## 2014-04-19 NOTE — Assessment & Plan Note (Signed)
Blood pressure elevated. Add Cozaar 50 mg daily. She will track her blood pressure at home and we will increase medications as needed.

## 2014-04-19 NOTE — Assessment & Plan Note (Signed)
I discussed weight loss today. There appears to be an issue with compliance.

## 2014-05-02 ENCOUNTER — Telehealth: Payer: Self-pay | Admitting: Family

## 2014-05-02 NOTE — Telephone Encounter (Signed)
90 day supply of Omeprazole sent to pharmacy. Pt last seen by PCP on 06/2013 and has not future appts on file with [place].  Please advise when pt should f/u?

## 2014-05-03 NOTE — Telephone Encounter (Signed)
Left message for patient to return my call.

## 2014-05-03 NOTE — Telephone Encounter (Signed)
Pt scheduled follow up 05/16/2014

## 2014-05-03 NOTE — Telephone Encounter (Signed)
Pt is due for follow up- please schedule

## 2014-05-16 ENCOUNTER — Ambulatory Visit (INDEPENDENT_AMBULATORY_CARE_PROVIDER_SITE_OTHER): Payer: BC Managed Care – PPO | Admitting: Family

## 2014-05-16 ENCOUNTER — Encounter: Payer: Self-pay | Admitting: Family

## 2014-05-16 VITALS — BP 124/74 | HR 84 | Temp 97.8°F | Resp 16 | Ht 64.75 in | Wt 289.8 lb

## 2014-05-16 DIAGNOSIS — D72829 Elevated white blood cell count, unspecified: Secondary | ICD-10-CM

## 2014-05-16 DIAGNOSIS — I10 Essential (primary) hypertension: Secondary | ICD-10-CM

## 2014-05-16 DIAGNOSIS — R059 Cough, unspecified: Secondary | ICD-10-CM

## 2014-05-16 DIAGNOSIS — R05 Cough: Secondary | ICD-10-CM

## 2014-05-16 LAB — CBC WITH DIFFERENTIAL/PLATELET
Basophils Absolute: 0 10*3/uL (ref 0.0–0.1)
Basophils Relative: 0.4 % (ref 0.0–3.0)
EOS PCT: 5.7 % — AB (ref 0.0–5.0)
Eosinophils Absolute: 0.4 10*3/uL (ref 0.0–0.7)
HEMATOCRIT: 35.7 % — AB (ref 36.0–46.0)
HEMOGLOBIN: 11.9 g/dL — AB (ref 12.0–15.0)
LYMPHS ABS: 1.6 10*3/uL (ref 0.7–4.0)
LYMPHS PCT: 21.4 % (ref 12.0–46.0)
MCHC: 33.2 g/dL (ref 30.0–36.0)
MCV: 85.3 fl (ref 78.0–100.0)
MONOS PCT: 9.3 % (ref 3.0–12.0)
Monocytes Absolute: 0.7 10*3/uL (ref 0.1–1.0)
Neutro Abs: 4.7 10*3/uL (ref 1.4–7.7)
Neutrophils Relative %: 63.2 % (ref 43.0–77.0)
PLATELETS: 293 10*3/uL (ref 150.0–400.0)
RBC: 4.19 Mil/uL (ref 3.87–5.11)
RDW: 14 % (ref 11.5–15.5)
WBC: 7.5 10*3/uL (ref 4.0–10.5)

## 2014-05-16 MED ORDER — ALBUTEROL SULFATE HFA 108 (90 BASE) MCG/ACT IN AERS: 2.0000 | INHALATION_SPRAY | Freq: Four times a day (QID) | RESPIRATORY_TRACT | Status: DC | PRN

## 2014-05-16 MED ORDER — BENZONATATE 100 MG PO CAPS: 100.0000 mg | ORAL_CAPSULE | Freq: Three times a day (TID) | ORAL | Status: DC | PRN

## 2014-05-16 NOTE — Assessment & Plan Note (Signed)
Viral symptoms.  Advised pt as follows:  Add mucinex 600mg  twice daily for chest congestion. Start tessalon as needed for cough. Add claritin 10mg  once daily for congestion. Schedule flu shot for when you are feeling better some time soon.  Call if symptoms worsen or if symptoms are not improved in 3 days.

## 2014-05-16 NOTE — Progress Notes (Signed)
Pre visit review using our clinic review tool, if applicable. No additional management support is needed unless otherwise documented below in the visit note. 

## 2014-05-16 NOTE — Assessment & Plan Note (Signed)
BP stable on current meds.  Continue same obtain bmet.  

## 2014-05-16 NOTE — Progress Notes (Signed)
Subjective:    Patient ID: Briana Miller, female    DOB: 05-21-1956, 58 y.o.   MRN: [id number]  HPI  [name] is a 58 yr old female who presents today for follow up.  1) HTN-  Current BP meds include lasix, losartan, metoprolol.  Denies CP. SOB is at baseline.   BP Readings from Last 3 Encounters:  05/16/14 124/74  04/19/14 142/88  04/17/14 124/74   2) Cough- reports productive cough x 3 days.  Reports sweating and chills since last night. Denies sick contacts at home but some of her coworkers have been sick.   Requests refill of albuterol.  Review of Systems See hpi  Past Medical History  Diagnosis Date  . Arthritis   . GERD (gastroesophageal reflux disease)   . Overactive bladder   . Cardiomyopathy     History   Social History  . Marital Status: Married    Spouse Name: N/A    Number of Children: 2  . Years of Education: N/A   Occupational History  .     Social History Main Topics  . Smoking status: Never Smoker   . Smokeless tobacco: Never Used  . Alcohol Use: 1.2 oz/week    2 Glasses of wine per week     Comment: None at present. Usually has 2 glasses wine daily  . Drug Use: No  . Sexual Activity: Not on file   Other Topics Concern  . 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 Knee arthroscopy      2007  . Total knee arthroplasty  09/22/2011    Procedure: TOTAL KNEE ARTHROPLASTY;  Surgeon: Raymon MuttonStephen D Lucey, MD;  [facility];  Service: Orthopedics;  Laterality: Right;  . Rotator cuff repair Right 03/23/13    Family History  Problem Relation Age of Onset  . Cancer Mother     colon 6860 and breast 5954- deceased  . Diabetes Father       living  . Asthma Father   . Hypertension Father   . Heart attack Father 5479    medical management per pt  . Glaucoma Father     had eye transplant    No Known Allergies  Current Outpatient Prescriptions on File Prior to Visit  Medication Sig Dispense Refill  . calcium-vitamin D (OSCAL WITH D) 500-200 MG-UNIT per tablet Take 1 tablet by mouth daily.    . diclofenac (VOLTAREN) 50 MG EC tablet Take 1 tablet by mouth 2 (two) times daily.    . furosemide (LASIX) 20 MG tablet Take 2 tablets (40 mg total) by mouth daily. (Patient taking differently: Take 40 mg by mouth. Take two tablets on Saturday and Sunday) 180 tablet 3  . Glucosamine-Chondroit-Vit C-Mn (GLUCOSAMINE-CHONDROITIN) TABS Take 1 tablet by mouth daily.    Marland Kitchen. losartan (COZAAR) 50 MG tablet Take 1 tablet (50 mg total) by mouth daily. 90 tablet 3  . metoprolol succinate (TOPROL XL) 50 MG 24 hr tablet Take 1 tablet (50 mg total) by mouth daily. 90 tablet 3  . omeprazole (PRILOSEC) 40 MG capsule TAKE ONE  CAPSULE BY MOUTH EVERY DAY 90 capsule 0  . VOLTAREN 1 % GEL   1   No current facility-administered medications on file prior to visit.    BP 124/74 mmHg  Pulse 84  Temp(Src) 97.8 F (36.6 C) (Oral)  Resp 16  Ht 5' 4.75" (1.645 m)  Wt 289 lb 12.8 oz (131.452 kg)  BMI 48.58 kg/m2  SpO2 100%  LMP 06/17/2007       Objective:   Physical Exam  Constitutional: She is oriented to person, place, and time. She appears well-developed and well-nourished. No distress.  HENT:  Head: Normocephalic and atraumatic.  Right Ear: Tympanic membrane and ear canal normal.  Left Ear: Tympanic membrane and ear canal normal.  Mouth/Throat: No oropharyngeal exudate, posterior oropharyngeal edema or posterior oropharyngeal erythema.  Cardiovascular: Normal rate and regular rhythm.   No murmur heard. Pulmonary/Chest: Effort normal and breath sounds normal. No respiratory distress. She has no wheezes. She has no rales. She exhibits no  tenderness.  Neurological: She is alert and oriented to person, place, and time.  Psychiatric: She has a normal mood and affect. Her behavior is normal. Judgment and thought content normal.          Assessment & Plan:

## 2014-05-16 NOTE — Patient Instructions (Addendum)
Please complete lab work prior to leaving.  Add mucinex 600mg  twice daily for chest congestion. Start tessalon as needed for cough. Add claritin 10mg  once daily for congestion. Schedule flu shot for when you are feeling better some time soon.  Call if symptoms worsen or if symptoms are not improved in 3 days.

## 2014-05-16 NOTE — Addendum Note (Signed)
Addended by: Verdie Shire on: 05/16/2014 02:04 PM   Modules accepted: Orders

## 2014-05-17 LAB — BASIC METABOLIC PANEL
BUN: 15 mg/dL (ref 6–23)
CO2: 25 meq/L (ref 19–32)
Calcium: 8.6 mg/dL (ref 8.4–10.5)
Chloride: 108 mEq/L (ref 96–112)
Creatinine, Ser: 0.9 mg/dL (ref 0.4–1.2)
GFR: 82.13 mL/min (ref >60.00)
GLUCOSE: 123 mg/dL — AB (ref 70–99)
Potassium: 3.8 mEq/L (ref 3.5–5.1)
Sodium: 140 mEq/L (ref 135–145)

## 2014-05-18 ENCOUNTER — Other Ambulatory Visit: Payer: Self-pay | Admitting: Family

## 2014-05-18 ENCOUNTER — Encounter: Payer: Self-pay | Admitting: Family

## 2014-05-18 DIAGNOSIS — R7303 Prediabetes: Secondary | ICD-10-CM | POA: Insufficient documentation

## 2014-05-18 MED ORDER — MULTI-VITAMIN/MINERALS PO TABS: 1.0000 | ORAL_TABLET | Freq: Every day | ORAL | Status: DC

## 2014-05-22 ENCOUNTER — Other Ambulatory Visit (INDEPENDENT_AMBULATORY_CARE_PROVIDER_SITE_OTHER): Payer: BC Managed Care – PPO

## 2014-05-22 DIAGNOSIS — R7309 Other abnormal glucose: Secondary | ICD-10-CM

## 2014-05-22 DIAGNOSIS — R7303 Prediabetes: Secondary | ICD-10-CM

## 2014-05-22 LAB — HEMOGLOBIN A1C: Hgb A1c MFr Bld: 6.4 % (ref 4.6–6.5)

## 2014-05-23 ENCOUNTER — Encounter: Payer: Self-pay | Admitting: Family

## 2014-06-06 ENCOUNTER — Ambulatory Visit (AMBULATORY_SURGERY_CENTER): Payer: BC Managed Care – PPO | Admitting: Gastroenterology

## 2014-06-06 ENCOUNTER — Encounter: Payer: Self-pay | Admitting: Gastroenterology

## 2014-06-06 VITALS — BP 117/74 | HR 65 | Temp 98.4°F | Resp 21 | Ht 65.5 in | Wt 289.0 lb

## 2014-06-06 DIAGNOSIS — R1013 Epigastric pain: Secondary | ICD-10-CM

## 2014-06-06 DIAGNOSIS — K2950 Unspecified chronic gastritis without bleeding: Secondary | ICD-10-CM

## 2014-06-06 DIAGNOSIS — K2990 Gastroduodenitis, unspecified, without bleeding: Secondary | ICD-10-CM

## 2014-06-06 DIAGNOSIS — K2970 Gastritis, unspecified, without bleeding: Secondary | ICD-10-CM

## 2014-06-06 MED ORDER — SODIUM CHLORIDE 0.9 % IV SOLN: 500.0000 mL | INTRAVENOUS | Status: DC

## 2014-06-06 NOTE — Progress Notes (Signed)
Patient awakening,vss,report to rn 

## 2014-06-06 NOTE — Progress Notes (Signed)
Called to room to assist during endoscopic procedure.  Patient ID and intended procedure confirmed with present staff. Received instructions for my participation in the procedure from the performing physician.  

## 2014-06-06 NOTE — Patient Instructions (Signed)
YOU HAD AN ENDOSCOPIC PROCEDURE TODAY AT THE  ENDOSCOPY CENTER: Refer to the procedure report that was given to you for any specific questions about what was found during the examination.  If the procedure report does not answer your questions, please call your gastroenterologist to clarify.  If you requested that your care partner not be given the details of your procedure findings, then the procedure report has been included in a sealed envelope for you to review at your convenience later.  YOU SHOULD EXPECT: Some feelings of bloating in the abdomen. Passage of more gas than usual.  Walking can help get rid of the air that was put into your GI tract during the procedure and reduce the bloating. If you had a lower endoscopy (such as a colonoscopy or flexible sigmoidoscopy) you may notice spotting of blood in your stool or on the toilet paper. If you underwent a bowel prep for your procedure, then you may not have a normal bowel movement for a few days.  DIET: Your first meal following the procedure should be a light meal and then it is ok to progress to your normal diet.  A half-sandwich or bowl of soup is an example of a good first meal.  Heavy or fried foods are harder to digest and may make you feel nauseous or bloated.  Likewise meals heavy in dairy and vegetables can cause extra gas to form and this can also increase the bloating.  Drink plenty of fluids but you should avoid alcoholic beverages for 24 hours.  ACTIVITY: Your care partner should take you home directly after the procedure.  You should plan to take it easy, moving slowly for the rest of the day.  You can resume normal activity the day after the procedure however you should NOT DRIVE or use heavy machinery for 24 hours (because of the sedation medicines used during the test).    SYMPTOMS TO REPORT IMMEDIATELY: A gastroenterologist can be reached at any hour.  During normal business hours, 8:30 AM to 5:00 PM Monday through Friday,  call [phone].  After hours and on weekends, please call the GI answering service at [phone] who will take a message and have the physician on call contact you.  Following upper endoscopy (EGD)  Vomiting of blood or coffee ground material  New chest pain or pain under the shoulder blades  Painful or persistently difficult swallowing  New shortness of breath  Fever of 100F or higher  Black, tarry-looking stools  FOLLOW UP: If any biopsies were taken you will be contacted by phone or by letter within the next 1-3 weeks.  Call your gastroenterologist if you have not heard about the biopsies in 3 weeks.  Our staff will call the home number listed on your records the next business day following your procedure to check on you and address any questions or concerns that you may have at that time regarding the information given to you following your procedure. This is a courtesy call and so if there is no answer at the home number and we have not heard from you through the emergency physician on call, we will assume that you have returned to your regular daily activities without incident.  SIGNATURES/CONFIDENTIALITY: You and/or your care partner have signed paperwork which will be entered into your electronic medical record.  These signatures attest to the fact that that the information above on your After Visit Summary has been reviewed and is understood.  Full responsibility of   the confidentiality of this discharge information lies with you and/or your care-partner. 

## 2014-06-06 NOTE — Op Note (Signed)
[facility] [address] [place], [address]   ENDOSCOPY PROCEDURE REPORT  PATIENT: Briana Miller, Briana Miller  MR#: [id number] BIRTHDATE: 05-20-1956 , 58  yrs. old GENDER: female ENDOSCOPIST: Rachael Fee, MD REFERRED BY:  Sandford Craze, FNP PROCEDURE DATE:  06/06/2014 PROCEDURE:  EGD w/ biopsy ASA CLASS:     Class II INDICATIONS:  dyspepsia. MEDICATIONS: Monitored anesthesia care and Propofol 200 mg IV TOPICAL ANESTHETIC: none  DESCRIPTION OF PROCEDURE: After the risks benefits and alternatives of the procedure were thoroughly explained, informed consent was obtained.  The LB [id number] endoscope was introduced through the mouth and advanced to the second portion of the duodenum , Without limitations.  The instrument was slowly withdrawn as the mucosa was fully examined.  There was mild to moderate, non-specific pangastritis.  The stomach was biopsied (antrum, body) and sent to pathology.  The examination was otherwise normal.  Retroflexed views revealed no abnormalities. The scope was then withdrawn from the patient and the procedure completed. COMPLICATIONS: There were no immediate complications.  ENDOSCOPIC IMPRESSION: There was mild to moderate, non-specific pangastritis.  The stomach was biopsied (antrum, body) and sent to pathology.  The examination was otherwise normal  RECOMMENDATIONS: Await final pathology.  If biopsies are positive for H.  pylori, you will be started on appropriate antibiotics.  eSigned:  Rachael Fee, MD 06/06/2014 3:44 PM

## 2014-06-07 ENCOUNTER — Ambulatory Visit: Payer: BC Managed Care – PPO | Admitting: Gastroenterology

## 2014-06-07 ENCOUNTER — Telehealth: Payer: Self-pay

## 2014-06-07 ENCOUNTER — Ambulatory Visit: Payer: BC Managed Care – PPO

## 2014-06-07 NOTE — Telephone Encounter (Signed)
  Follow up Call-  Call back number 06/06/2014 06/03/2013  Post procedure Call Back phone  # [phone]  Permission to leave phone message Yes Yes     Patient questions:  Do you have a fever, pain , or abdominal swelling? No. Pain Score  0 *  Have you tolerated food without any problems? Yes.    Have you been able to return to your normal activities? Yes.    Do you have any questions about your discharge instructions: Diet   No. Medications  No. Follow up visit  No.  Do you have questions or concerns about your Care? No.  Actions: * If pain score is 4 or above: No action needed, pain <4.  No problems per the pt. maw

## 2014-06-13 ENCOUNTER — Encounter: Payer: Self-pay | Admitting: Gastroenterology

## 2014-06-27 ENCOUNTER — Other Ambulatory Visit: Payer: Self-pay | Admitting: Family

## 2014-06-28 ENCOUNTER — Encounter (HOSPITAL_BASED_OUTPATIENT_CLINIC_OR_DEPARTMENT_OTHER): Payer: BC Managed Care – PPO

## 2014-07-25 NOTE — Progress Notes (Signed)
HPI: FU cardiomyopathy. Echocardiogram in October of 2014 showed an ejection fraction of 40-45%, mild left ventricular hypertrophy and mild left atrial enlargement. Laboratories in October 2014 showed normal BUN, creatinine, hemoglobin and TSH.; BNP 55. Nuclear study in December of 2014 showed an ejection fraction of 51% and normal perfusion. Echocardiogram repeated September 2015 and showed an ejection fraction of 50-55%. Mild left atrial enlargement. Since last seen she complains of dyspnea and lower extremity edema. She continues not to take Lasix but twice weekly. She denies chest pain. She states she doesn't take her Lasix because it's too far to walk to the bathroom at work.  Current Outpatient Prescriptions  Medication Sig Dispense Refill  . albuterol (PROVENTIL HFA;VENTOLIN HFA) 108 (90 BASE) MCG/ACT inhaler Inhale 2 puffs into the lungs every 6 (six) hours as needed for wheezing or shortness of breath. 1 Inhaler 0  . calcium-vitamin D (OSCAL WITH D) 500-200 MG-UNIT per tablet Take 1 tablet by mouth daily.    . furosemide (LASIX) 20 MG tablet Take 2 tablets (40 mg total) by mouth daily. (Patient taking differently: Take 40 mg by mouth. Take two tablets on Saturday and Sunday) 180 tablet 3  . Glucosamine-Chondroit-Vit C-Mn (GLUCOSAMINE-CHONDROITIN) TABS Take 1 tablet by mouth daily.    . metoprolol succinate (TOPROL XL) 50 MG 24 hr tablet Take 1 tablet (50 mg total) by mouth daily. 90 tablet 3  . omeprazole (PRILOSEC) 40 MG capsule TAKE ONE CAPSULE BY MOUTH EVERY DAY 90 capsule 1  . VOLTAREN 1 % GEL   1   No current facility-administered medications for this visit.     Past Medical History  Diagnosis Date  . Arthritis   . GERD (gastroesophageal reflux disease)   . Overactive bladder   . Cardiomyopathy   . Anemia   . CHF (congestive heart failure)   . Hypertension     Past Surgical History  Procedure Laterality Date  . Knee arthroscopy      20 07  . Total knee  arthroplasty  09/22/2011    Procedure: TOTAL KNEE ARTHROPLASTY;  Surgeon: Raymon Mutton, MD;  Location: [facility] OR;  Service: Orthopedics;  Laterality: Right;  . Rotator cuff repair Right 03/23/13  . Colonoscopy      History   Social History  . Marital Status: Married    Spouse Name: N/A  . Number of Children: 2  . Years of Education: N/A   Occupational History  .     Social History Main Topics  . Smoking status: Never Smoker   . Smokeless tobacco: Never Used  . Alcohol Use: 1.2 oz/week    2 Glasses of wine per week     Comment: None at present. Usually has 2 glasses wine daily  . Drug Use: No  . Sexual Activity: Not on file   Other Topics Concern  . 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 Remaining systems are negative.  Physical Exam: Well-developed morbidly obese in no acute distress.  Skin is warm and dry.  HEENT is normal.  Neck is supple.  Chest is clear to auscultation with normal expansion.  Cardiovascular exam is regular rate and rhythm.  Abdominal exam nontender or distended. No masses palpated. Extremities show 2-3+ edema. neuro grossly intact  ECG sinus rhythm at a rate of 83. Normal axis. Nonspecific ST changes. Left ventricular hypertrophy.

## 2014-07-26 ENCOUNTER — Ambulatory Visit (INDEPENDENT_AMBULATORY_CARE_PROVIDER_SITE_OTHER): Payer: BLUE CROSS/BLUE SHIELD | Admitting: Cardiology

## 2014-07-26 ENCOUNTER — Encounter: Payer: Self-pay | Admitting: Cardiology

## 2014-07-26 VITALS — BP 114/72 | HR 83 | Ht 65.5 in | Wt 299.0 lb

## 2014-07-26 DIAGNOSIS — I420 Dilated cardiomyopathy: Secondary | ICD-10-CM

## 2014-07-26 DIAGNOSIS — I5021 Acute systolic (congestive) heart failure: Secondary | ICD-10-CM

## 2014-07-26 DIAGNOSIS — I10 Essential (primary) hypertension: Secondary | ICD-10-CM

## 2014-07-26 NOTE — Assessment & Plan Note (Signed)
I discussed weight loss today.

## 2014-07-26 NOTE — Assessment & Plan Note (Addendum)
Patient remains volume overloaded. LV function has improved on most recent echocardiogram. She continues not to take her Lasix as prescribed. She takes it twice weekly and will not take it during the week as she states it is too far to walk to the bathroom. I stressed the importance of compliance. I'm not optimistic that she will follow my instructions. She questioned today whether she could be taken out of work.

## 2014-07-26 NOTE — Assessment & Plan Note (Signed)
Blood pressure controlled. Continue present medications. 

## 2014-07-26 NOTE — Assessment & Plan Note (Addendum)
LV function improved on most recent echocardiogram. I think this is most likely related to high blood pressure. Continue beta blocker. ACE inhibitor caused cough. This has been discontinued. Continue Cozaar. Blood pressure is well controlled. Check K and renal function.

## 2014-07-26 NOTE — Patient Instructions (Signed)
Your physician wants you to follow-up in: 6 MONTHS WITH DR CRENSHAW You will receive a reminder letter in the mail two months in advance. If you don't receive a letter, please call our office to schedule the follow-up appointment.   Your physician recommends that you HAVE LAB WORK TODAY 

## 2014-09-07 DIAGNOSIS — M7542 Impingement syndrome of left shoulder: Secondary | ICD-10-CM | POA: Insufficient documentation

## 2014-09-07 HISTORY — DX: Impingement syndrome of left shoulder: M75.42

## 2014-09-08 ENCOUNTER — Encounter (HOSPITAL_BASED_OUTPATIENT_CLINIC_OR_DEPARTMENT_OTHER): Payer: Self-pay

## 2014-11-03 DIAGNOSIS — K2960 Other gastritis without bleeding: Secondary | ICD-10-CM | POA: Insufficient documentation

## 2014-11-03 DIAGNOSIS — I5022 Chronic systolic (congestive) heart failure: Secondary | ICD-10-CM | POA: Insufficient documentation

## 2014-11-03 DIAGNOSIS — I10 Essential (primary) hypertension: Secondary | ICD-10-CM | POA: Insufficient documentation

## 2014-11-03 DIAGNOSIS — K219 Gastro-esophageal reflux disease without esophagitis: Secondary | ICD-10-CM | POA: Insufficient documentation

## 2014-11-03 HISTORY — DX: Essential (primary) hypertension: I10

## 2014-11-06 ENCOUNTER — Telehealth: Payer: Self-pay | Admitting: Gastroenterology

## 2014-11-06 NOTE — Telephone Encounter (Signed)
Rec'd from [facility] forward 12 pages to Dr. Christella Hartigan

## 2014-11-10 ENCOUNTER — Telehealth: Payer: Self-pay | Admitting: Gastroenterology

## 2014-11-10 ENCOUNTER — Encounter: Payer: Self-pay | Admitting: Gastroenterology

## 2014-11-10 NOTE — Telephone Encounter (Signed)
Left message on machine to call back  

## 2014-11-10 NOTE — Telephone Encounter (Signed)
Pt has been scheduled for EGD and previsit.   

## 2014-11-10 NOTE — Telephone Encounter (Signed)
She was seen at [facility] by Dr. Delrae Alfred to consider bariatric surgery.  He explained that she 'needs repeat EGD by me to confirm resolution of gastritis.  Patty, Can you please call her to explain that Dr. Lily Peer wants her to have a repeat EGD and I am happy to do that for her. ([facility]) thanks

## 2014-12-20 ENCOUNTER — Encounter: Payer: BLUE CROSS/BLUE SHIELD | Admitting: Gastroenterology

## 2015-01-26 DIAGNOSIS — E66813 Obesity, class 3: Secondary | ICD-10-CM

## 2015-01-26 DIAGNOSIS — M7541 Impingement syndrome of right shoulder: Secondary | ICD-10-CM | POA: Insufficient documentation

## 2015-01-26 HISTORY — DX: Obesity, class 3: E66.813

## 2015-03-05 ENCOUNTER — Telehealth: Payer: Self-pay | Admitting: Family

## 2015-03-05 ENCOUNTER — Encounter: Payer: BLUE CROSS/BLUE SHIELD | Admitting: Family

## 2015-03-14 NOTE — Telephone Encounter (Signed)
Yes

## 2015-03-14 NOTE — Telephone Encounter (Signed)
Pt was no show 03/05/15 7:15am, cpe appt, pt has rescheduled for 03/23/15, charge for no show?

## 2015-03-22 ENCOUNTER — Telehealth: Payer: Self-pay | Admitting: Behavioral Health

## 2015-03-22 NOTE — Telephone Encounter (Signed)
Unable to reach patient at time of Pre-Visit Call.  Left message for patient to return call when available.    

## 2015-03-23 ENCOUNTER — Ambulatory Visit (INDEPENDENT_AMBULATORY_CARE_PROVIDER_SITE_OTHER): Payer: BLUE CROSS/BLUE SHIELD | Admitting: Family

## 2015-03-23 ENCOUNTER — Encounter: Payer: Self-pay | Admitting: Family

## 2015-03-23 ENCOUNTER — Other Ambulatory Visit (HOSPITAL_COMMUNITY)
Admission: RE | Admit: 2015-03-23 | Discharge: 2015-03-23 | Disposition: A | Discharge status: Home / Self Care | Payer: BLUE CROSS/BLUE SHIELD | Source: Ambulatory Visit | Attending: Family | Admitting: Family

## 2015-03-23 VITALS — BP 151/84 | HR 73 | Temp 98.1°F | Resp 18 | Ht 65.0 in | Wt 289.6 lb

## 2015-03-23 DIAGNOSIS — N760 Acute vaginitis: Secondary | ICD-10-CM | POA: Insufficient documentation

## 2015-03-23 DIAGNOSIS — Z0000 Encounter for general adult medical examination without abnormal findings: Secondary | ICD-10-CM

## 2015-03-23 DIAGNOSIS — R400 Somnolence: Secondary | ICD-10-CM

## 2015-03-23 DIAGNOSIS — Z23 Encounter for immunization: Secondary | ICD-10-CM | POA: Diagnosis not present

## 2015-03-23 DIAGNOSIS — Z01419 Encounter for gynecological examination (general) (routine) without abnormal findings: Secondary | ICD-10-CM | POA: Insufficient documentation

## 2015-03-23 DIAGNOSIS — R5383 Other fatigue: Secondary | ICD-10-CM

## 2015-03-23 DIAGNOSIS — Z1151 Encounter for screening for human papillomavirus (HPV): Secondary | ICD-10-CM | POA: Insufficient documentation

## 2015-03-23 DIAGNOSIS — G43809 Other migraine, not intractable, without status migrainosus: Secondary | ICD-10-CM | POA: Diagnosis not present

## 2015-03-23 DIAGNOSIS — G4710 Hypersomnia, unspecified: Secondary | ICD-10-CM

## 2015-03-23 DIAGNOSIS — G43909 Migraine, unspecified, not intractable, without status migrainosus: Secondary | ICD-10-CM | POA: Insufficient documentation

## 2015-03-23 DIAGNOSIS — R350 Frequency of micturition: Secondary | ICD-10-CM | POA: Diagnosis not present

## 2015-03-23 LAB — CBC WITH DIFFERENTIAL/PLATELET
Basophils Absolute: 0 10*3/uL (ref 0.0–0.1)
Basophils Relative: 0 % (ref 0–1)
EOS ABS: 0.3 10*3/uL (ref 0.0–0.7)
Eosinophils Relative: 5 % (ref 0–5)
HCT: 35.7 % — ABNORMAL LOW (ref 36.0–46.0)
HEMOGLOBIN: 12 g/dL (ref 12.0–15.0)
LYMPHS ABS: 2.1 10*3/uL (ref 0.7–4.0)
Lymphocytes Relative: 34 % (ref 12–46)
MCH: 27.8 pg (ref 26.0–34.0)
MCHC: 33.6 g/dL (ref 30.0–36.0)
MCV: 82.6 fL (ref 78.0–100.0)
MONOS PCT: 12 % (ref 3–12)
MPV: 10.4 fL (ref 8.6–12.4)
Monocytes Absolute: 0.7 10*3/uL (ref 0.1–1.0)
NEUTROS ABS: 3 10*3/uL (ref 1.7–7.7)
NEUTROS PCT: 49 % (ref 43–77)
Platelets: 290 10*3/uL (ref 150–400)
RBC: 4.32 MIL/uL (ref 3.87–5.11)
RDW: 14.4 % (ref 11.5–15.5)
WBC: 6.1 10*3/uL (ref 4.0–10.5)

## 2015-03-23 LAB — LIPID PANEL
CHOL/HDL RATIO: 3
Cholesterol: 175 mg/dL (ref 0–200)
HDL: 58 mg/dL (ref >39.00)
LDL Cholesterol: 91 mg/dL (ref 0–99)
NONHDL: 117.31
Triglycerides: 131 mg/dL (ref 0.0–149.0)
VLDL: 26.2 mg/dL (ref 0.0–40.0)

## 2015-03-23 LAB — BASIC METABOLIC PANEL
BUN: 22 mg/dL (ref 6–23)
CALCIUM: 9.4 mg/dL (ref 8.4–10.5)
CO2: 26 mEq/L (ref 19–32)
Chloride: 108 mEq/L (ref 96–112)
Creatinine, Ser: 1.26 mg/dL — ABNORMAL HIGH (ref 0.40–1.20)
GFR: 56.24 mL/min — AB (ref >60.00)
GLUCOSE: 96 mg/dL (ref 70–99)
POTASSIUM: 4.2 meq/L (ref 3.5–5.1)
Sodium: 142 mEq/L (ref 135–145)

## 2015-03-23 LAB — HEPATIC FUNCTION PANEL
ALK PHOS: 88 U/L (ref 39–117)
ALT: 17 U/L (ref 0–35)
AST: 17 U/L (ref 0–37)
Albumin: 4.1 g/dL (ref 3.5–5.2)
Bilirubin, Direct: 0.1 mg/dL (ref 0.0–0.3)
TOTAL PROTEIN: 7.5 g/dL (ref 6.0–8.3)
Total Bilirubin: 0.3 mg/dL (ref 0.2–1.2)

## 2015-03-23 LAB — TSH: TSH: 1.53 u[IU]/mL (ref 0.35–4.50)

## 2015-03-23 MED ORDER — TOLTERODINE TARTRATE ER 4 MG PO CP24: 4.0000 mg | ORAL_CAPSULE | Freq: Every day | ORAL | Status: DC

## 2015-03-23 NOTE — Progress Notes (Signed)
Subjective:    Patient ID: Briana Miller, female    DOB: 1955-09-15, 59 y.o.   MRN: [id number]  HPI  Mr. Moros is a 59 yr old female who presents today for complete physical- however she has several concerns today.    Immunizations: declines flu shot, tdap today Diet: working on healthy diet (undergoing evaluation for gastric sleep with bariatrics) Exercise:  Limited due to knee pain Colonoscopy: 2014 Dexa: due Pap Smear: 2013 per pt Mammogram: due  She has several concerns as well.  1) Fatigue- wants to "sleep too much."  Reports that she has nocturia x 7,  Frequency/urgency through the day. Reports that she snores, "badly."  + daytime somnolence.   2) HA's- reports occasional dizzy spells with her HA's.  HA's are frontal, Pressure.  Takes excedrin migraine. Has occasional associated nausea/photophobia.  Note some heat intolerance.  Reports that she feels hot "inside my body."    3) Vaginal itching, noted "something hanging out of my vagina"  + "sweet vaginal odor"   Review of Systems  Constitutional:       Wt Readings from Last 3 Encounters: 03/23/15 : 289.6 lb  07/26/14 : 299 lb (135.626 kg) 06/06/14 : 289 lb (131.09 kg)    HENT: Negative for hearing loss and rhinorrhea.   Eyes: Negative for visual disturbance.  Respiratory: Negative for cough.        Had some wheezing earlier this week but resolved after albuterol  Cardiovascular: Positive for leg swelling.  Gastrointestinal: Negative for diarrhea and constipation.  Genitourinary: Positive for urgency and frequency. Negative for dysuria.  Musculoskeletal: Positive for arthralgias.  Skin: Negative for rash.  Neurological: Positive for headaches.  Hematological: Negative for adenopathy.  Psychiatric/Behavioral:       Denies depression/anxiety   Past Medical History  Diagnosis Date  . Arthritis   . GERD (gastroesophageal reflux disease)   . Overactive bladder   . Cardiomyopathy (HCC)   . Anemia   . CHF  (congestive heart failure) (HCC)   . Hypertension     Social History   Social History  . Marital Status: Married    Spouse Name: N/A  . Number of Children: 2  . Years of Education: N/A   Occupational History  .     Social History Main Topics  . Smoking status: Never Smoker   . Smokeless tobacco: Never Used  . Alcohol Use: 1.2 oz/week    2 Glasses of wine per week     Comment: None at present. Usually has 2 glasses wine daily  . Drug Use: No  . Sexual Activity: Not on file   Other Topics Concern  . 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 Knee arthroscopy      2007  . Total knee arthroplasty  09/22/2011    Procedure: TOTAL KNEE ARTHROPLASTY;  Surgeon: Raymon Mutton, MD;  Location: [facility] OR;  Service: Orthopedics;  Laterality: Right;  . Rotator cuff repair Right 03/23/13  . Colonoscopy      Family History  Problem Relation Age of Onset  . Cancer Mother  colon 60 and breast 54- deceased  . Colon cancer Mother   . Diabetes Father     living  . Asthma Father   . Hypertension Father   . Heart attack Father 26    medical management per pt  . Glaucoma Father     had eye transplant  . Kidney failure Father     acute renal failure    No Known Allergies  Current Outpatient Prescriptions on File Prior to Visit  Medication Sig Dispense Refill  . albuterol (PROVENTIL HFA;VENTOLIN HFA) 108 (90 BASE) MCG/ACT inhaler Inhale 2 puffs into the lungs every 6 (six) hours as needed for wheezing or shortness of breath. 1 Inhaler 0  . calcium-vitamin D (OSCAL WITH D) 500-200 MG-UNIT per tablet Take 1 tablet by mouth daily.    . furosemide (LASIX) 20 MG tablet Take  2 tablets (40 mg total) by mouth daily. (Patient taking differently: Take 40 mg by mouth. Take two tablets on Saturday and Sunday) 180 tablet 3  . losartan (COZAAR) 50 MG tablet Take 50 mg by mouth daily.    . metoprolol succinate (TOPROL XL) 50 MG 24 hr tablet Take 1 tablet (50 mg total) by mouth daily. 90 tablet 3  . omeprazole (PRILOSEC) 40 MG capsule TAKE ONE CAPSULE BY MOUTH EVERY DAY (Patient taking differently: TAKE TWO CAPSULE BY MOUTH EVERY DAY) 90 capsule 1   No current facility-administered medications on file prior to visit.    BP 151/84 mmHg  Pulse 73  Temp(Src) 98.1 F (36.7 C) (Oral)  Resp 18  Ht  (1.651 m)  Wt 289 lb 9.6 oz (131.362 kg)  BMI 48.19 kg/m2  SpO2 100%  LMP 06/17/2007       Objective:   Physical Exam  Physical Exam  Constitutional: She is oriented to person, place, and time. She appears well-developed and well-nourished. No distress.  HENT:  Head: Normocephalic and atraumatic.  Right Ear: Tympanic membrane and ear canal normal.  Left Ear: Tympanic membrane and ear canal normal.  Mouth/Throat: Oropharynx is clear and moist.  Eyes: Pupils are equal, round, and reactive to light. No scleral icterus.  Neck: Normal range of motion. No thyromegaly present.  Cardiovascular: Normal rate and regular rhythm.  2+ bilateral LE edema is noted.  No murmur heard. Pulmonary/Chest: Effort normal and breath sounds normal. No respiratory distress. He has no wheezes. She has no rales. She exhibits no tenderness.  Abdominal: Soft. Bowel sounds are normal. He exhibits no distension and no mass. There is no tenderness. There is no rebound and no guarding.  Lymphadenopathy:    She has no cervical adenopathy.  Neurological: She is alert and oriented to person, place, and time.  She exhibits normal muscle tone. Coordination normal.  Skin: Skin is warm and dry.  Psychiatric: She has a normal mood and affect. Her behavior is normal. Judgment and thought content normal.    Breasts: Examined lying Right: Without masses, retractions, discharge or axillary adenopathy.  Left: Without masses, retractions, discharge or axillary adenopathy.  Inguinal/mons: Normal without inguinal adenopathy  External genitalia: Normal  BUS/Urethra/Skene's glands: Normal  Bladder: Normal  Vagina: Normal- + vaginal fishy vaginal odor noted Cervix: Normal  Uterus: normal in size, shape and contour. Midline and mobile  Adnexa/parametria:  Rt: Without masses or tenderness.  Lt: Without masses or tenderness.  Anus and perineum: Normal           Assessment & Plan:         Assessment &  Plan:

## 2015-03-23 NOTE — Assessment & Plan Note (Addendum)
Pap today. Tdap today.  Discussed diet/exericse, weight loss. Refer for screening dexa and screening mammogram.

## 2015-03-23 NOTE — Assessment & Plan Note (Signed)
Continue excedrin migraine prn.

## 2015-03-23 NOTE — Assessment & Plan Note (Signed)
Fatigue/loud snoring. Discussed possibility of OSA. Will refer for split night sleep study to evaluate. Will also obtain baseline labs due to fatigue.  Nocturia is also likely a contributing factor to her fatige.

## 2015-03-23 NOTE — Patient Instructions (Signed)
Please complete lab work prior to leaving. You will be contacted about your referral for sleep study. Start Detrol LA for your urinary frequency- hopefully this will help. We will contact you with your results.

## 2015-03-23 NOTE — Progress Notes (Signed)
Pre visit review using our clinic review tool, if applicable. No additional management support is needed unless otherwise documented below in the visit note. 

## 2015-03-23 NOTE — Assessment & Plan Note (Signed)
Wet prep performed today. Await results, suspect BV.

## 2015-03-23 NOTE — Assessment & Plan Note (Signed)
Suspect overactive bladder. Trial of Detrol LA, will also send urine for UA + culture

## 2015-03-24 LAB — URINALYSIS, ROUTINE W REFLEX MICROSCOPIC
BILIRUBIN URINE: NEGATIVE
Glucose, UA: NEGATIVE
KETONES UR: NEGATIVE
Leukocytes, UA: NEGATIVE
Nitrite: NEGATIVE
PH: 5.5 (ref 5.0–8.0)
Protein, ur: NEGATIVE
SPECIFIC GRAVITY, URINE: 1.023 (ref 1.001–1.035)

## 2015-03-24 LAB — URINALYSIS, MICROSCOPIC ONLY
BACTERIA UA: NONE SEEN [HPF]
CASTS: NONE SEEN [LPF]
CRYSTALS: NONE SEEN [HPF]
RBC / HPF: NONE SEEN RBC/HPF (ref <=2)
WBC UA: NONE SEEN WBC/HPF (ref <=5)
Yeast: NONE SEEN [HPF]

## 2015-03-24 LAB — URINE CULTURE
Colony Count: NO GROWTH
Organism ID, Bacteria: NO GROWTH

## 2015-03-24 LAB — HIV ANTIBODY (ROUTINE TESTING W REFLEX): HIV 1&2 Ab, 4th Generation: NONREACTIVE

## 2015-03-24 LAB — HEPATITIS C ANTIBODY: HCV Ab: NEGATIVE

## 2015-03-27 LAB — CYTOLOGY - PAP

## 2015-03-28 ENCOUNTER — Telehealth: Payer: Self-pay | Admitting: *Deleted

## 2015-03-28 ENCOUNTER — Telehealth: Payer: Self-pay | Admitting: Family

## 2015-03-28 DIAGNOSIS — N760 Acute vaginitis: Secondary | ICD-10-CM

## 2015-03-28 DIAGNOSIS — N898 Other specified noninflammatory disorders of vagina: Secondary | ICD-10-CM

## 2015-03-28 MED ORDER — OMEPRAZOLE 40 MG PO CPDR: 40.0000 mg | DELAYED_RELEASE_CAPSULE | Freq: Every day | ORAL | Status: DC

## 2015-03-28 NOTE — Telephone Encounter (Addendum)
Lab work shows hep C and HIV negative.  Blood count looks good. Thyroid normal, cholesterol good.  Urine culture negative for infection, pap normal, High risk HPV negative. Kidney function is slightly abnormal. Make sure she is drinking enough water.  We will plan to repeat her kidney function in 3 months.  (she should follow up in office in 3 months).

## 2015-03-28 NOTE — Telephone Encounter (Signed)
Received fax request for omeprazole 40mg  once a day.  90 day supply sent.

## 2015-03-28 NOTE — Telephone Encounter (Signed)
Relation to HE:NIDP Call back [phone]   Reason for call:  Patient inquiring about lab results

## 2015-03-29 LAB — CERVICOVAGINAL ANCILLARY ONLY
Bacterial vaginitis: NEGATIVE
Candida vaginitis: NEGATIVE

## 2015-03-29 NOTE — Telephone Encounter (Signed)
Pt returned your call, please call back  °Thanks ° ° °

## 2015-03-29 NOTE — Telephone Encounter (Signed)
Also, let pt know that lab work was negative for yeast infection or bacterial vaginosis.

## 2015-03-29 NOTE — Telephone Encounter (Signed)
She can try otc vagisil externally.  It is possible that her symptoms could be related to atrophy (thinning of skin) from menopause. She can try an otc monistat if that does not work.  Otherwise, if symptoms persist, we should repeat her vaginal testing.  Also, is it possible to add on GC/Chlamydia and trich to the sample?

## 2015-03-29 NOTE — Telephone Encounter (Signed)
Left a message for call back.  

## 2015-03-29 NOTE — Telephone Encounter (Signed)
Pt notified and made aware.  She stated understanding and agreed to follow up in 3 months.  Appt scheduled. She also mentioned that she continues to experience vaginal itching and wanted to know if anything could be prescribed for that.  Please advise.

## 2015-03-30 NOTE — Telephone Encounter (Signed)
Specimen date is 03/23/15 so we will not be able to add gc/chlamydia or trich to previous specimen. Left message for pt to return my call.

## 2015-04-02 NOTE — Telephone Encounter (Signed)
Per verbal from PCP, we should have pt return to office for urine sample to check for yeast, BV, trich, gc/chlamydia. Left message for pt to return my call re: recommendation as well as 10/13 instruction below.

## 2015-04-03 ENCOUNTER — Inpatient Hospital Stay (HOSPITAL_BASED_OUTPATIENT_CLINIC_OR_DEPARTMENT_OTHER): Admission: RE | Admit: 2015-04-03 | Payer: BLUE CROSS/BLUE SHIELD | Source: Ambulatory Visit

## 2015-04-04 NOTE — Telephone Encounter (Signed)
Left a message for call back.  

## 2015-04-06 NOTE — Telephone Encounter (Addendum)
Pt states she has tried vagisil and monistat, neither of those have worked.  Pt agrees with providing a urine sample. Lab appt scheduled.  Future labs ordered.  Pt also wanted to know if cinnamon would through off her sugar.  Pt was made aware that cinnamon can help lower blood sugar.  She stated understanding and says that she was told the same.

## 2015-04-06 NOTE — Addendum Note (Signed)
Addended by: Tylene Fantasia on: 04/06/2015 04:47 PM   Modules accepted: Orders

## 2015-04-06 NOTE — Telephone Encounter (Addendum)
Left a message for call back as well as mailed a letter to make patient aware that we have been trying to reach her.  Will close encounter until patient calls the office back.

## 2015-04-09 ENCOUNTER — Other Ambulatory Visit (INDEPENDENT_AMBULATORY_CARE_PROVIDER_SITE_OTHER): Payer: BLUE CROSS/BLUE SHIELD

## 2015-04-09 ENCOUNTER — Other Ambulatory Visit: Payer: BLUE CROSS/BLUE SHIELD | Admitting: Family

## 2015-04-09 ENCOUNTER — Other Ambulatory Visit (HOSPITAL_COMMUNITY)
Admission: RE | Admit: 2015-04-09 | Discharge: 2015-04-09 | Disposition: A | Discharge status: Home / Self Care | Payer: BLUE CROSS/BLUE SHIELD | Source: Ambulatory Visit | Attending: Family | Admitting: Family

## 2015-04-09 DIAGNOSIS — Z113 Encounter for screening for infections with a predominantly sexual mode of transmission: Secondary | ICD-10-CM | POA: Diagnosis present

## 2015-04-09 DIAGNOSIS — N760 Acute vaginitis: Secondary | ICD-10-CM | POA: Insufficient documentation

## 2015-04-09 NOTE — Addendum Note (Signed)
Addended by: Verdie Shire on: 04/09/2015 03:35 PM   Modules accepted: Orders

## 2015-04-11 ENCOUNTER — Encounter: Payer: Self-pay | Admitting: Family

## 2015-04-11 LAB — URINE CYTOLOGY ANCILLARY ONLY
CHLAMYDIA, DNA PROBE: NEGATIVE
NEISSERIA GONORRHEA: NEGATIVE
TRICH (WINDOWPATH): NEGATIVE

## 2015-04-13 ENCOUNTER — Telehealth: Payer: Self-pay | Admitting: *Deleted

## 2015-04-13 DIAGNOSIS — N760 Acute vaginitis: Secondary | ICD-10-CM

## 2015-04-13 LAB — URINE CYTOLOGY ANCILLARY ONLY
Bacterial vaginitis: NEGATIVE
CANDIDA VAGINITIS: NEGATIVE

## 2015-04-13 NOTE — Telephone Encounter (Signed)
-----   Message from Sandford Craze, NP sent at 04/13/2015  1:22 PM EDT ----- Also negative for BV and yeast.

## 2015-04-13 NOTE — Telephone Encounter (Signed)
Notified pt of below results and she states she had already tried monistat and another otc cream for vaginal itching. Also changed soap to antibacterial brand and wants to know what else she can try?  Per verbal from PCP, could be coming from postmenopausal atrophy and she could refer pt to GYN.  Pt is agreeable to referral.  See pended referral.   From: Sandford Craze, NP      Sent: 04/11/2015  9:34 PM      To: Kathi Simpers, CMA        GC/Chlamydia and trich testing is negative.

## 2015-04-30 ENCOUNTER — Other Ambulatory Visit: Payer: Self-pay | Admitting: Cardiology

## 2015-04-30 ENCOUNTER — Telehealth: Payer: Self-pay | Admitting: Cardiology

## 2015-04-30 ENCOUNTER — Other Ambulatory Visit: Payer: Self-pay

## 2015-04-30 MED ORDER — LOSARTAN POTASSIUM 50 MG PO TABS: 50.0000 mg | ORAL_TABLET | Freq: Every day | ORAL | Status: DC

## 2015-05-01 NOTE — Telephone Encounter (Signed)
Close encounter 

## 2015-05-08 NOTE — Progress Notes (Signed)
      HPI: FU cardiomyopathy. Echocardiogram in October of 2014 showed an ejection fraction of 40-45%, mild left ventricular hypertrophy and mild left atrial enlargement. Nuclear study in December of 2014 showed an ejection fraction of 51% and normal perfusion. Echocardiogram repeated September 2015 and showed an ejection fraction of 50-55%. Mild left atrial enlargement. Since last seen patient complains of dyspnea and edema; again not taking diuretics as prescribed; no exertional CP.  Current Outpatient Prescriptions  Medication Sig Dispense Refill  . albuterol (PROVENTIL HFA;VENTOLIN HFA) 108 (90 BASE) MCG/ACT inhaler Inhale 2 puffs into the lungs every 6 (six) hours as needed for wheezing or shortness of breath. 1 Inhaler 0  . calcium-vitamin D (OSCAL WITH D) 500-200 MG-UNIT per tablet Take 1 tablet by mouth daily.    . diclofenac (CATAFLAM) 50 MG tablet Take 50 mg by mouth 2 (two) times daily.  2  . losartan (COZAAR) 50 MG tablet Take 1 tablet (50 mg total) by mouth daily. 30 tablet 3  . metoprolol succinate (TOPROL-XL) 50 MG 24 hr tablet TAKE 1 TABLET BY MOUTH EVERY DAY 90 tablet 1   No current facility-administered medications for this visit.     Past Medical History  Diagnosis Date  . Arthritis   . GERD (gastroesophageal reflux disease)   . Overactive bladder   . Cardiomyopathy (HCC)   . Anemia   . CHF (congestive heart failure) (HCC)   . Hypertension     Past Surgical History  Procedure Laterality Date  . Knee arthroscopy      2007  . Total knee arthroplasty  09/22/2011    Procedure: TOTAL KNEE ARTHROPLASTY;  Surgeon: Raymon Mutton, MD;  Location: [facility] OR;  Service: Orthopedics;  Laterality: Right;  . Rotator cuff repair Right 03/23/13  . Colonoscopy      Social History   Social History  . Marital Status: Married    Spouse Name: N/A  . Number of Children: 2  . Years of Education: N/A   Occupational History  .     Social History Main Topics  . Smoking status:  Never Smoker   . Smokeless tobacco: Never Used  . Alcohol Use: 1.2 oz/week    2 Glasses of wine per week     Comment: None at present. Usually has 2 glasses wine daily  . Drug Use: No  . Sexual Activity: Not on file   Other Topics Concern  . 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 Remaining systems are negative.  Physical Exam: Well-developed morbidly obese in no acute distress.  Skin is warm and dry.  HEENT is normal.  Neck is supple.  Chest is clear to auscultation with normal expansion.  Cardiovascular exam is regular rate and rhythm.  Abdominal exam nontender or distended. No masses palpated. Extremities show 2+ edema. neuro grossly intact  ECG Sinus rhythm at a rate of 79. Normal axis. IVCD, inferior lateral T-wave inversion.

## 2015-05-15 ENCOUNTER — Encounter: Payer: Self-pay | Admitting: Cardiology

## 2015-05-15 ENCOUNTER — Ambulatory Visit (INDEPENDENT_AMBULATORY_CARE_PROVIDER_SITE_OTHER): Payer: BLUE CROSS/BLUE SHIELD | Admitting: Cardiology

## 2015-05-15 VITALS — BP 134/84 | HR 79 | Ht 65.5 in | Wt 298.7 lb

## 2015-05-15 DIAGNOSIS — I420 Dilated cardiomyopathy: Secondary | ICD-10-CM | POA: Diagnosis not present

## 2015-05-15 DIAGNOSIS — I10 Essential (primary) hypertension: Secondary | ICD-10-CM

## 2015-05-15 DIAGNOSIS — I5033 Acute on chronic diastolic (congestive) heart failure: Secondary | ICD-10-CM | POA: Diagnosis not present

## 2015-05-15 DIAGNOSIS — I5042 Chronic combined systolic (congestive) and diastolic (congestive) heart failure: Secondary | ICD-10-CM | POA: Insufficient documentation

## 2015-05-15 DIAGNOSIS — I509 Heart failure, unspecified: Secondary | ICD-10-CM | POA: Insufficient documentation

## 2015-05-15 NOTE — Assessment & Plan Note (Signed)
Patient remains volume overloaded. LV function has improved on most recent echocardiogram. She continues not to take her Lasix as prescribed. She takes it twice weekly and will not take it during the week as she states it is too far to walk to the bathroom. I stressed the importance of compliance. I'm not optimistic that she will follow my instructions. Discussed low Na diet and fluid restriction.

## 2015-05-15 NOTE — Assessment & Plan Note (Signed)
Improved on most recent echo; continue ARB and toprol.

## 2015-05-15 NOTE — Patient Instructions (Signed)
Your physician wants you to follow-up in: 6 MONTHS WITH DR CRENSHAW You will receive a reminder letter in the mail two months in advance. If you don't receive a letter, please call our office to schedule the follow-up appointment.   If you need a refill on your cardiac medications before your next appointment, please call your pharmacy.  

## 2015-05-15 NOTE — Assessment & Plan Note (Signed)
Discussed importance of weight loss. 

## 2015-05-15 NOTE — Assessment & Plan Note (Signed)
BP controlled; continue present meds. 

## 2015-06-22 ENCOUNTER — Ambulatory Visit (HOSPITAL_BASED_OUTPATIENT_CLINIC_OR_DEPARTMENT_OTHER): Payer: BLUE CROSS/BLUE SHIELD

## 2015-06-22 ENCOUNTER — Telehealth: Payer: Self-pay | Admitting: Family

## 2015-06-22 ENCOUNTER — Encounter: Payer: Self-pay | Admitting: Family

## 2015-06-22 ENCOUNTER — Ambulatory Visit (INDEPENDENT_AMBULATORY_CARE_PROVIDER_SITE_OTHER): Payer: BLUE CROSS/BLUE SHIELD | Admitting: Family

## 2015-06-22 VITALS — BP 136/94 | HR 77 | Temp 97.8°F | Resp 24 | Ht 65.5 in | Wt 287.8 lb

## 2015-06-22 DIAGNOSIS — R3129 Other microscopic hematuria: Secondary | ICD-10-CM | POA: Diagnosis not present

## 2015-06-22 DIAGNOSIS — R609 Edema, unspecified: Secondary | ICD-10-CM

## 2015-06-22 DIAGNOSIS — R350 Frequency of micturition: Secondary | ICD-10-CM | POA: Diagnosis not present

## 2015-06-22 DIAGNOSIS — I83009 Varicose veins of unspecified lower extremity with ulcer of unspecified site: Secondary | ICD-10-CM

## 2015-06-22 DIAGNOSIS — K219 Gastro-esophageal reflux disease without esophagitis: Secondary | ICD-10-CM

## 2015-06-22 DIAGNOSIS — I10 Essential (primary) hypertension: Secondary | ICD-10-CM

## 2015-06-22 DIAGNOSIS — L97909 Non-pressure chronic ulcer of unspecified part of unspecified lower leg with unspecified severity: Secondary | ICD-10-CM

## 2015-06-22 DIAGNOSIS — N3281 Overactive bladder: Secondary | ICD-10-CM

## 2015-06-22 LAB — POC URINALSYSI DIPSTICK (AUTOMATED)
BILIRUBIN UA: NEGATIVE
GLUCOSE UA: NEGATIVE
KETONES UA: NEGATIVE
LEUKOCYTES UA: NEGATIVE
NITRITE UA: NEGATIVE
PH UA: 6
Protein, UA: NEGATIVE
Spec Grav, UA: 1.025
Urobilinogen, UA: NEGATIVE

## 2015-06-22 LAB — BASIC METABOLIC PANEL
BUN: 20 mg/dL (ref 6–23)
CALCIUM: 9.6 mg/dL (ref 8.4–10.5)
CHLORIDE: 105 meq/L (ref 96–112)
CO2: 27 meq/L (ref 19–32)
Creatinine, Ser: 0.8 mg/dL (ref 0.40–1.20)
GFR: 94.92 mL/min (ref >60.00)
GLUCOSE: 100 mg/dL — AB (ref 70–99)
POTASSIUM: 4.3 meq/L (ref 3.5–5.1)
Sodium: 139 mEq/L (ref 135–145)

## 2015-06-22 MED ORDER — ACETAMINOPHEN-CODEINE #3 300-30 MG PO TABS: 1.0000 | ORAL_TABLET | Freq: Four times a day (QID) | ORAL | Status: DC | PRN

## 2015-06-22 MED ORDER — MIRABEGRON ER 25 MG PO TB24: 25.0000 mg | ORAL_TABLET | Freq: Every day | ORAL | Status: DC

## 2015-06-22 MED ORDER — CEPHALEXIN 500 MG PO CAPS: 500.0000 mg | ORAL_CAPSULE | Freq: Two times a day (BID) | ORAL | Status: DC

## 2015-06-22 NOTE — Progress Notes (Signed)
Subjective:    Patient ID: Briana Miller, female    DOB: [birth year], 60 y.o.   MRN: [id number]  HPI  Briana Miller is a 60 yr old female who presents today with several concerns:  1) LE wounds- has had some odor from the drainage.  Reports + pain.  She reports that she takes lasix 40mg  once daily 4 days a week 40mg  bid on the days that she does not work.   Wt Readings from Last 3 Encounters:  06/22/15 287 lb 12.8 oz (130.545 kg)  05/15/15 298 lb 11.2 oz (135.489 kg)  03/23/15 289 lb 9.6 oz (131.362 kg)   2) Urinary frequency- reports + urinary frequency and episodes of urge incontinence. Reports that she has trouble holding her urine.    3) bilateral foot numbness L>R.  She reports that she has had some back pain, using an OTC 'backache pill' which is not helping.   4) GERD-  Had leg cramping on omeprazole.   Review of Systems See HPI  Past Medical History  Diagnosis Date  . Arthritis   . GERD (gastroesophageal reflux disease)   . Overactive bladder   . Cardiomyopathy (HCC)   . Anemia   . CHF (congestive heart failure) (HCC)   . Hypertension     Social History   Social History  . Marital Status: Married    Spouse Name: N/A  . Number of Children: 2  . Years of Education: N/A   Occupational History  .     Social History Main Topics  . Smoking status: Never Smoker   . Smokeless tobacco: Never Used  . Alcohol Use: 1.2 oz/week    2 Glasses of wine per week     Comment: None at present. Usually has 2 glasses wine daily  . Drug Use: No  . Sexual Activity: Not on file   Other Topics Concern  . 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 Knee  arthroscopy      2007  . Total knee arthroplasty  09/22/2011    Procedure: TOTAL KNEE ARTHROPLASTY;  Surgeon: Raymon Mutton, MD;  Location: [facility] OR;  Service: Orthopedics;  Laterality: Right;  . Rotator cuff repair Right 03/23/13  . Colonoscopy      Family History  Problem Relation Age of Onset  . Cancer Mother     colon 65 and breast 27- deceased  . Colon cancer Mother   . Diabetes Father     living  . Asthma Father   . Hypertension Father   . Heart attack Father 35    medical management per pt  . Glaucoma Father     had eye transplant  . Kidney failure Father     acute renal failure- died 34    No Known Allergies  Current Outpatient Prescriptions on File Prior to Visit  Medication Sig Dispense Refill  . albuterol (PROVENTIL HFA;VENTOLIN HFA) 108 (90 BASE) MCG/ACT inhaler Inhale 2 puffs into the lungs every 6 (six) hours as needed for wheezing or shortness of breath. 1 Inhaler 0  . calcium-vitamin D (OSCAL  WITH D) 500-200 MG-UNIT per tablet Take 1 tablet by mouth daily.    . diclofenac (CATAFLAM) 50 MG tablet Take 50 mg by mouth 2 (two) times daily.  2  . losartan (COZAAR) 50 MG tablet Take 1 tablet (50 mg total) by mouth daily. 30 tablet 3  . metoprolol succinate (TOPROL-XL) 50 MG 24 hr tablet TAKE 1 TABLET BY MOUTH EVERY DAY 90 tablet 1   No current facility-administered medications on file prior to visit.    BP 136/94 mmHg  Pulse 77  Temp(Src) 97.8 F (36.6 C) (Oral)  Resp 24  Ht 5' 5.5" (1.664 m)  Wt 287 lb 12.8 oz (130.545 kg)  BMI 47.15 kg/m2  SpO2 100%  LMP 06/17/2007       Objective:   Physical Exam  Constitutional: She is oriented to person, place, and time. She appears well-developed and well-nourished. No distress.  HENT:  Head: Normocephalic and atraumatic.  Eyes: No scleral icterus.  Cardiovascular: Normal rate and regular rhythm.   No murmur heard. Pulses:      Dorsalis pedis pulses are 2+ on the right side, and 2+ on the left side.        Posterior tibial pulses are 2+ on the right side, and 2+ on the left side.  Pulmonary/Chest: Effort normal and breath sounds normal. No respiratory distress. She has no wheezes. She has no rales. She exhibits no tenderness.  Musculoskeletal:  2+ RLE edema, 3+ LLE edema  Neurological: She is alert and oriented to person, place, and time.  Bilateral foot sensation to monofilament  Psychiatric: She has a normal mood and affect. Her behavior is normal. Judgment and thought content normal.  Skin- shallow clean based wounds- L shin 2.5 x 2.5 cm, R shin 2 cm long, 1 cm wide    Assessment & Plan:  Bilateral foot numbness- consider b12 and folate level next visit.

## 2015-06-22 NOTE — Telephone Encounter (Signed)
Relation to pt: self Call back [phone]   Reason for call:  Patient requesting MRI orders please send to [facility] [address], [place], [place] [address] [phone]

## 2015-06-22 NOTE — Progress Notes (Signed)
Pre visit review using our clinic review tool, if applicable. No additional management support is needed unless otherwise documented below in the visit note. 

## 2015-06-22 NOTE — Telephone Encounter (Signed)
error:[id number] ° °

## 2015-06-22 NOTE — Telephone Encounter (Signed)
Spoke with pt, she was scheduled to have bilateral LE dopplers at 1:30 today. She cancelled due to cost and was told by her insurance that [facility] at below locations is covered with no charge.  Ok per PCP to change location. Scheduled appt for first available, 06/26/15 at 9:30am. Notified PCP and pt.

## 2015-06-22 NOTE — Patient Instructions (Addendum)
Complete lab work prior to leaving. Start Keflex (antibiotic) twice daily. Please contact [facility] to schedule a fitting for compression hose. Purchase OTC Hydrocolloid dressings..  Wash wounds with sterile saline then apply hydrocolloid dressing every 3 days. Increase furosemide to one tab twice daily. You may use tylenol #3 as needed for pain. Start myrbetriq for overactive bladder. Complete ultrasound of your legs on the first floor- to rule out clots.

## 2015-06-23 ENCOUNTER — Telehealth: Payer: Self-pay | Admitting: Family

## 2015-06-23 DIAGNOSIS — K219 Gastro-esophageal reflux disease without esophagitis: Secondary | ICD-10-CM | POA: Insufficient documentation

## 2015-06-23 DIAGNOSIS — I83009 Varicose veins of unspecified lower extremity with ulcer of unspecified site: Secondary | ICD-10-CM | POA: Insufficient documentation

## 2015-06-23 DIAGNOSIS — N3281 Overactive bladder: Secondary | ICD-10-CM | POA: Insufficient documentation

## 2015-06-23 DIAGNOSIS — L97909 Non-pressure chronic ulcer of unspecified part of unspecified lower leg with unspecified severity: Secondary | ICD-10-CM

## 2015-06-23 LAB — URINE CULTURE: Colony Count: 15000

## 2015-06-23 MED ORDER — RANITIDINE HCL 150 MG PO TABS: 150.0000 mg | ORAL_TABLET | Freq: Two times a day (BID) | ORAL | Status: DC

## 2015-06-23 NOTE — Telephone Encounter (Signed)
Please let pt know that she can start zantac 150mg  bid for gerd- you can wait to call her until LE doppler is back. Thanks.

## 2015-06-23 NOTE — Assessment & Plan Note (Signed)
UA 1+ blood- will send for culture to exclude infection, begin trial of myrbetriq.

## 2015-06-23 NOTE — Assessment & Plan Note (Addendum)
Rx with Keflex due to pt report of malodorous drainage from wounds (though no odor appreciated today).   Advised pt as follows:  Purchase OTC Hydrocolloid dressings..  Wash wounds with sterile saline then apply hydrocolloid dressing every 3 days. Increase furosemide to one tab twice daily. You may use tylenol #3 as needed for pain. Increase lasix to 40mg  bid.

## 2015-06-23 NOTE — Assessment & Plan Note (Signed)
Uncontrolled. Trial of zantac.

## 2015-06-26 ENCOUNTER — Telehealth: Payer: Self-pay | Admitting: Family

## 2015-06-26 NOTE — Telephone Encounter (Signed)
Caller name: [place]  Relationship to patient: Self   Can be reached: [phone]  Reason for call: Pt called in requesting her lab results.

## 2015-06-27 NOTE — Telephone Encounter (Signed)
Urine grew a small amount of bacteria. This should be taken care of by the Keflex she was given.  Kidney function and electrolytes are normal.  She can start zantac 150mg  twice daily for her gerd symptoms.  Did she complete doppler?

## 2015-06-27 NOTE — Telephone Encounter (Signed)
Spoke with [facility] and requested result be faxed to nurse station. Awaiting fax.

## 2015-06-27 NOTE — Telephone Encounter (Signed)
See phone note of 06/26/15.

## 2015-06-27 NOTE — Telephone Encounter (Signed)
Left detailed message on pt's cell# to return my call.

## 2015-06-27 NOTE — Telephone Encounter (Signed)
Notified pt and she voices understanding. 

## 2015-07-02 ENCOUNTER — Ambulatory Visit: Payer: BLUE CROSS/BLUE SHIELD | Admitting: Family

## 2015-07-02 ENCOUNTER — Encounter: Payer: Self-pay | Admitting: Family

## 2015-07-02 ENCOUNTER — Ambulatory Visit (INDEPENDENT_AMBULATORY_CARE_PROVIDER_SITE_OTHER): Payer: BLUE CROSS/BLUE SHIELD | Admitting: Family

## 2015-07-02 VITALS — BP 141/79 | HR 81 | Temp 97.3°F | Resp 18 | Ht 66.0 in | Wt 291.0 lb

## 2015-07-02 DIAGNOSIS — I83009 Varicose veins of unspecified lower extremity with ulcer of unspecified site: Secondary | ICD-10-CM | POA: Diagnosis not present

## 2015-07-02 DIAGNOSIS — I5033 Acute on chronic diastolic (congestive) heart failure: Secondary | ICD-10-CM

## 2015-07-02 DIAGNOSIS — N3281 Overactive bladder: Secondary | ICD-10-CM

## 2015-07-02 DIAGNOSIS — R609 Edema, unspecified: Secondary | ICD-10-CM

## 2015-07-02 DIAGNOSIS — L97909 Non-pressure chronic ulcer of unspecified part of unspecified lower leg with unspecified severity: Secondary | ICD-10-CM

## 2015-07-02 NOTE — Patient Instructions (Signed)
Please schedule lab work at your convenience in the next few weeks.

## 2015-07-02 NOTE — Progress Notes (Signed)
Subjective:    Patient ID: Briana Miller, female    DOB: [birth year], 60 y.o.   MRN: [id number]  HPI  [name] is a 60 yr old female who presents today for follow up of her LE venous stasis ulcers and LE edema. She continues lasix once daily since her last visit.  Despite this her weight is up 4 pounds.  She was unable to obtain the recommended dressings and support hose due to her very busy work schedule.  She completed keflex and notes that the wounds no longer have an odor. Feels like the wounds have decreased in size.  Wt Readings from Last 3 Encounters:  07/02/15 291 lb (131.997 kg)  06/22/15 287 lb 12.8 oz (130.545 kg)  05/15/15 298 lb 11.2 oz (135.489 kg)   Overactive bladder- notes improvement with myrbetric.    Review of Systems See HPI  Past Medical History  Diagnosis Date  . Arthritis   . GERD (gastroesophageal reflux disease)   . Overactive bladder   . Cardiomyopathy (HCC)   . Anemia   . CHF (congestive heart failure) (HCC)   . Hypertension     Social History   Social History  . Marital Status: Married    Spouse Name: N/A  . Number of Children: 2  . Years of Education: N/A   Occupational History  .     Social History Main Topics  . Smoking status: Never Smoker   . Smokeless tobacco: Never Used  . Alcohol Use: 1.2 oz/week    2 Glasses of wine per week     Comment: None at present. Usually has 2 glasses wine daily  . Drug Use: No  . Sexual Activity: Not on file   Other Topics Concern  . 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 Knee arthroscopy      2007  . Total knee arthroplasty  09/22/2011    Procedure: TOTAL KNEE  ARTHROPLASTY;  Surgeon: Raymon Mutton, MD;  Location: [facility] OR;  Service: Orthopedics;  Laterality: Right;  . Rotator cuff repair Right 03/23/13  . Colonoscopy      Family History  Problem Relation Age of Onset  . Cancer Mother     colon 20 and breast 61- deceased  . Colon cancer Mother   . Diabetes Father     living  . Asthma Father   . Hypertension Father   . Heart attack Father 43    medical management per pt  . Glaucoma Father     had eye transplant  . Kidney failure Father     acute renal failure- died 73    No Known Allergies  Current Outpatient Prescriptions on File Prior to Visit  Medication Sig Dispense Refill  . acetaminophen-codeine (TYLENOL #3) 300-30 MG tablet Take 1 tablet by mouth every 6 (six) hours as needed for moderate pain. 30 tablet 0  . albuterol (PROVENTIL HFA;VENTOLIN HFA) 108 (90 BASE) MCG/ACT inhaler Inhale 2 puffs into the lungs every 6 (six) hours as needed for wheezing or shortness of breath. 1 Inhaler 0  . calcium-vitamin D (  OSCAL WITH D) 500-200 MG-UNIT per tablet Take 1 tablet by mouth daily.    . furosemide (LASIX) 40 MG tablet Take 20 mg by mouth daily.    Marland Kitchen ibuprofen (ADVIL,MOTRIN) 800 MG tablet Take by mouth.    . losartan (COZAAR) 50 MG tablet Take 1 tablet (50 mg total) by mouth daily. 30 tablet 3  . metoprolol succinate (TOPROL-XL) 50 MG 24 hr tablet TAKE 1 TABLET BY MOUTH EVERY DAY 90 tablet 1  . mirabegron ER (MYRBETRIQ) 25 MG TB24 tablet Take 1 tablet (25 mg total) by mouth daily. 30 tablet 2  . ranitidine (ZANTAC) 150 MG tablet Take 1 tablet (150 mg total) by mouth 2 (two) times daily. 30 tablet 2  . diclofenac (CATAFLAM) 50 MG tablet Take 50 mg by mouth 2 (two) times daily. Reported on 07/02/2015  2   No current facility-administered medications on file prior to visit.    BP 141/79 mmHg  Pulse 81  Temp(Src) 97.3 F (36.3 C) (Oral)  Resp 18  Ht 5\' 6"  (1.676 m)  Wt 291 lb (131.997 kg)  BMI 46.99 kg/m2  SpO2 100%  LMP  06/17/2007       Objective:   Physical Exam  Constitutional: She appears well-developed and well-nourished.  Cardiovascular: Normal rate, regular rhythm and normal heart sounds.   No murmur heard. Pulmonary/Chest: Effort normal and breath sounds normal. No respiratory distress. She has no wheezes.  Musculoskeletal:  3+ bilateral LE edema  Psychiatric: She has a normal mood and affect. Her behavior is normal. Judgment and thought content normal.    Skin:  R shin lesion is  1 cm by 1.5 cm, more scabbed today-no drainage no odor   L  Shin lesion is 1.5 x 1.5      Assessment & Plan:

## 2015-07-02 NOTE — Progress Notes (Signed)
Pre visit review using our clinic review tool, if applicable. No additional management support is needed unless otherwise documented below in the visit note. 

## 2015-07-03 NOTE — Assessment & Plan Note (Signed)
Improved on myrbetriq, continue same.

## 2015-07-03 NOTE — Assessment & Plan Note (Signed)
Continue lasix.  She will return for bmet.

## 2015-07-03 NOTE — Assessment & Plan Note (Signed)
Stable and improving. I wrote the patient a note so she can leave early from work to get her support hose one day.

## 2015-07-13 ENCOUNTER — Ambulatory Visit (INDEPENDENT_AMBULATORY_CARE_PROVIDER_SITE_OTHER): Payer: BLUE CROSS/BLUE SHIELD | Admitting: Family Medicine

## 2015-07-13 ENCOUNTER — Encounter: Payer: Self-pay | Admitting: Family

## 2015-07-13 ENCOUNTER — Encounter: Payer: Self-pay | Admitting: Family Medicine

## 2015-07-13 VITALS — BP 132/70 | HR 84 | Temp 97.9°F | Ht 66.0 in | Wt 289.0 lb

## 2015-07-13 DIAGNOSIS — I83009 Varicose veins of unspecified lower extremity with ulcer of unspecified site: Secondary | ICD-10-CM | POA: Diagnosis not present

## 2015-07-13 DIAGNOSIS — L97909 Non-pressure chronic ulcer of unspecified part of unspecified lower leg with unspecified severity: Secondary | ICD-10-CM

## 2015-07-13 LAB — BASIC METABOLIC PANEL
BUN: 21 mg/dL (ref 7–25)
CO2: 28 mmol/L (ref 20–31)
Calcium: 9.3 mg/dL (ref 8.6–10.4)
Chloride: 106 mmol/L (ref 98–110)
Creat: 0.99 mg/dL (ref 0.50–1.05)
Glucose, Bld: 122 mg/dL — ABNORMAL HIGH (ref 65–99)
Potassium: 4.2 mmol/L (ref 3.5–5.3)
SODIUM: 141 mmol/L (ref 135–146)

## 2015-07-13 MED ORDER — CEPHALEXIN 500 MG PO CAPS: 500.0000 mg | ORAL_CAPSULE | Freq: Three times a day (TID) | ORAL | Status: AC

## 2015-07-13 MED ORDER — HYDROCODONE-ACETAMINOPHEN 5-325 MG PO TABS: 1.0000 | ORAL_TABLET | Freq: Four times a day (QID) | ORAL | Status: DC | PRN

## 2015-07-13 NOTE — Progress Notes (Signed)
Pre visit review using our clinic review tool, if applicable. No additional management support is needed unless otherwise documented below in the visit note. 

## 2015-07-13 NOTE — Patient Instructions (Addendum)
Follow up with Briana Miller next week We'll notify you of your lab results and make any changes if needed Increase your Lasix to 80 mg (2 tabs) for Saturday and Sunday Take the Keflex 3x/day for the infection We'll call you with your wound care appt Take the pain pills as needed Call with any questions or concerns Hang in there!!!

## 2015-07-13 NOTE — Progress Notes (Signed)
   Subjective:    Patient ID: Briana Miller, female    DOB: 07/20/1955, 60 y.o.   MRN: [id number]  HPI Leg sores- pt reports she has had them 'for a minute'.  Last week got compression socks and bandage that [name] recommended.  Pt reports bandages smell so bad from the drainage 'that I puke'.  Pt reports burning in lower legs.  Hx of venous stasis ulcers w/ previous infxn.  Hx of CHF- no SOB, cough.   Review of Systems For ROS see HPI     Objective:   Physical Exam  Constitutional: She appears well-developed and well-nourished.  Morbidly obese  HENT:  Head: Normocephalic and atraumatic.  Cardiovascular: Normal rate, regular rhythm and normal heart sounds.   Pulmonary/Chest: Effort normal and breath sounds normal. No respiratory distress. She has no wheezes. She has no rales.  Musculoskeletal: She exhibits edema (3+ bilateral LE edema, no oozing) and tenderness (very TTP over LEs bilaterally).  Skin: Skin is warm. There is erythema (erythema surrounding L lower leg anterior venous stasis ulcer w/ central scab.  R venous stasis ulcer w/o surrouding erythema).  Psychiatric: She has a normal mood and affect. Her behavior is normal.  Vitals reviewed.         Assessment & Plan:

## 2015-07-15 NOTE — Assessment & Plan Note (Signed)
Chronic problem.  Pt now w/ cellulitis on L lower leg.  3+ edema of lower extremities but no SOB, cough, wheezing, or CP.  Start Keflex.  Increase Lasix to 80mg  x2 days.  Check BMP to assess Cr and K+.  Refer to wound care due to recurrent infxn on leg ulcers.  Pt expressed understanding and is in agreement w/ plan.

## 2015-07-18 ENCOUNTER — Other Ambulatory Visit: Payer: BLUE CROSS/BLUE SHIELD

## 2015-07-18 ENCOUNTER — Ambulatory Visit: Payer: BLUE CROSS/BLUE SHIELD | Admitting: Family

## 2015-07-19 ENCOUNTER — Telehealth: Payer: Self-pay | Admitting: Family

## 2015-07-19 NOTE — Telephone Encounter (Signed)
Yes please

## 2015-07-19 NOTE — Telephone Encounter (Signed)
Pt was no show 07/18/15 9:45am for follow up, pt has another f/u scheduled for 07/23/15 (not a reschedule), charge or no charge?

## 2015-07-20 NOTE — Telephone Encounter (Signed)
Marked to charge and mailing letter °

## 2015-07-23 ENCOUNTER — Telehealth: Payer: Self-pay | Admitting: Family

## 2015-07-23 ENCOUNTER — Ambulatory Visit: Payer: BLUE CROSS/BLUE SHIELD | Admitting: Family

## 2015-07-23 NOTE — Telephone Encounter (Signed)
Noted  

## 2015-07-23 NOTE — Telephone Encounter (Signed)
Pt called in at 4:38 stating that she just remembered that she had an appt w/PCP and has already taken her fluid medication. Pt says that she will call in tomorrow to schedule after her appt with her leg doctor tomorrow.

## 2015-07-24 NOTE — Telephone Encounter (Signed)
Charge or no charge for 07/23/15?

## 2015-07-27 NOTE — Telephone Encounter (Signed)
Charge or no charge for 07/23/15? °

## 2015-07-27 NOTE — Telephone Encounter (Signed)
No charge. 

## 2015-08-13 ENCOUNTER — Telehealth: Payer: Self-pay | Admitting: *Deleted

## 2015-08-13 NOTE — Telephone Encounter (Signed)
Request for [facility] records to be forwarded to [facility] for surgical work-up; forwarded to [place] for Email/scan/SLS 02/27

## 2015-08-14 ENCOUNTER — Telehealth: Payer: Self-pay | Admitting: Gastroenterology

## 2015-08-14 NOTE — Telephone Encounter (Signed)
Needs rov to discuss before we can directly schedule this again

## 2015-08-14 NOTE — Telephone Encounter (Signed)
Mailbox is full.

## 2015-08-14 NOTE — Telephone Encounter (Signed)
Dr Christella Hartigan the pt was scheduled for previsit and endo and no showed the previsit appt so the EGD was cancelled.  Do you want to get her rescheduled or have her come to the office to discuss?

## 2015-08-20 NOTE — Telephone Encounter (Addendum)
Mail box is full unable to reach pt letter mailed

## 2015-09-11 ENCOUNTER — Telehealth: Payer: Self-pay | Admitting: Cardiology

## 2015-09-11 NOTE — Telephone Encounter (Signed)
Pt needs an appt please

## 2015-09-11 NOTE — Progress Notes (Signed)
HPI: FU cardiomyopathy. Echocardiogram in October of 2014 showed an ejection fraction of 40-45%, mild left ventricular hypertrophy and mild left atrial enlargement. Nuclear study in December of 2014 showed an ejection fraction of 51% and normal perfusion. Echocardiogram repeated September 2015 and showed an ejection fraction of 50-55%. Mild left atrial enlargement. Since last seen She has dyspnea with more extreme activities but not routine activities. She states she can walk 2 blocks without being short of breath or having chest pain. No orthopnea or PND. No exertional chest pain. She is now taking her Lasix daily as previously prescribed and her pedal edema has improved. Will require gastric bypass surgery and cardiology asked to evaluate preoperatively.  Current Outpatient Prescriptions  Medication Sig Dispense Refill  . albuterol (PROVENTIL HFA;VENTOLIN HFA) 108 (90 BASE) MCG/ACT inhaler Inhale 2 puffs into the lungs every 6 (six) hours as needed for wheezing or shortness of breath. 1 Inhaler 0  . calcium-vitamin D (OSCAL WITH D) 500-200 MG-UNIT per tablet Take 1 tablet by mouth daily.    . furosemide (LASIX) 40 MG tablet Take 20 mg by mouth daily.    Marland Kitchen ibuprofen (ADVIL,MOTRIN) 800 MG tablet Take by mouth.    . losartan (COZAAR) 50 MG tablet Take 1 tablet (50 mg total) by mouth daily. 30 tablet 3  . metoprolol succinate (TOPROL-XL) 50 MG 24 hr tablet TAKE 1 TABLET BY MOUTH EVERY DAY 90 tablet 1   No current facility-administered medications for this visit.     Past Medical History  Diagnosis Date  . Arthritis   . GERD (gastroesophageal reflux disease)   . Overactive bladder   . Cardiomyopathy (HCC)   . Anemia   . CHF (congestive heart failure) (HCC)   . Hypertension     Past Surgical History  Procedure Laterality Date  . Knee arthroscopy      2007  . Total knee arthroplasty  09/22/2011    Procedure: TOTAL KNEE ARTHROPLASTY;  Surgeon: Raymon Mutton, MD;  Location: [facility] OR;   Service: Orthopedics;  Laterality: Right;  . Rotator cuff repair Right 03/23/13  . Colonoscopy      Social History   Social History  . Marital Status: Married    Spouse Name: N/A  . Number of Children: 2  . Years of Education: N/A   Occupational History  .     Social History Main Topics  . Smoking status: Never Smoker   . Smokeless tobacco: Never Used  . Alcohol Use: 1.2 oz/week    2 Glasses of wine per week     Comment: None at present. Usually has 2 glasses wine daily  . Drug Use: No  . Sexual Activity: Not on file   Other Topics Concern  . 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 Cancer Mother     colon 37 and breast 86- deceased  . Colon cancer Mother   . Diabetes Father     living  . Asthma Father   . Hypertension Father   . Heart attack Father 56    medical  management per pt  . Glaucoma Father     had eye transplant  . Kidney failure Father     acute renal failure- died 12    ROS: no fevers or chills, productive cough, hemoptysis, dysphasia, odynophagia, melena, hematochezia, dysuria, hematuria, rash, seizure activity, orthopnea, PND, pedal edema, claudication. Remaining systems are negative.  Physical Exam: Well-developed obese in no acute distress.  Skin is warm and dry.  HEENT is normal.  Neck is supple.  Chest is clear to auscultation with normal expansion.  Cardiovascular exam is regular rate and rhythm.  Abdominal exam nontender or distended. No masses palpated. Extremities show trace edema. neuro grossly intact  ECG Sinus rhythm at a rate of 70. Normal axis. Nonspecific ST changes. Prolonged QT interval.

## 2015-09-12 ENCOUNTER — Ambulatory Visit (INDEPENDENT_AMBULATORY_CARE_PROVIDER_SITE_OTHER): Payer: BLUE CROSS/BLUE SHIELD | Admitting: Cardiology

## 2015-09-12 ENCOUNTER — Encounter: Payer: Self-pay | Admitting: Cardiology

## 2015-09-12 VITALS — BP 122/81 | HR 76 | Ht 65.5 in | Wt 289.1 lb

## 2015-09-12 DIAGNOSIS — I429 Cardiomyopathy, unspecified: Secondary | ICD-10-CM | POA: Diagnosis not present

## 2015-09-12 DIAGNOSIS — I420 Dilated cardiomyopathy: Secondary | ICD-10-CM

## 2015-09-12 DIAGNOSIS — Z01818 Encounter for other preprocedural examination: Secondary | ICD-10-CM

## 2015-09-12 DIAGNOSIS — I10 Essential (primary) hypertension: Secondary | ICD-10-CM | POA: Diagnosis not present

## 2015-09-12 DIAGNOSIS — Z01810 Encounter for preprocedural cardiovascular examination: Secondary | ICD-10-CM

## 2015-09-12 NOTE — Assessment & Plan Note (Signed)
Patient is planning gastric sleeve surgery.

## 2015-09-12 NOTE — Assessment & Plan Note (Signed)
Plan repeat echocardiogram. Continue ARB and beta blocker.

## 2015-09-12 NOTE — Assessment & Plan Note (Signed)
Patient is scheduled for gastric sleeve surgery. She has reasonable functional capacity able to walk 2 blocks with no dyspnea or chest pain. Previous nuclear study showed no ischemia. Will repeat echocardiogram to reassess LV function. If it remains unchanged we will allow her to proceed without further cardiac evaluation. Continue present medications pre-and postoperatively.

## 2015-09-12 NOTE — Assessment & Plan Note (Signed)
Blood pressure controlled. Continue present medications. 

## 2015-09-12 NOTE — Patient Instructions (Signed)
Medication Instructions:   NO CHANGE  Labwork:  Your physician recommends that you HAVE LAB WORK ASAP  Testing/Procedures:  Your physician has requested that you have an echocardiogram. Echocardiography is a painless test that uses sound waves to create images of your heart. It provides your doctor with information about the size and shape of your heart and how well your heart's chambers and valves are working. This procedure takes approximately one hour. There are no restrictions for this procedure.    Follow-Up:  Your physician wants you to follow-up in: 6 MONTHS WITH DR Jens Som You will receive a reminder letter in the mail two months in advance. If you don't receive a letter, please call our office to schedule the follow-up appointment.

## 2015-09-12 NOTE — Assessment & Plan Note (Signed)
Patient is much improved after being compliant with diuretics. Continue present dose of Lasix. Check potassium and renal function.

## 2015-09-14 DIAGNOSIS — G8929 Other chronic pain: Secondary | ICD-10-CM | POA: Insufficient documentation

## 2015-09-14 HISTORY — DX: Other chronic pain: G89.29

## 2015-09-15 LAB — BASIC METABOLIC PANEL
BUN: 18 mg/dL (ref 7–25)
CALCIUM: 9 mg/dL (ref 8.6–10.4)
CHLORIDE: 108 mmol/L (ref 98–110)
CO2: 23 mmol/L (ref 20–31)
Creat: 0.82 mg/dL (ref 0.50–1.05)
Glucose, Bld: 101 mg/dL — ABNORMAL HIGH (ref 65–99)
POTASSIUM: 4.1 mmol/L (ref 3.5–5.3)
SODIUM: 142 mmol/L (ref 135–146)

## 2015-09-21 ENCOUNTER — Ambulatory Visit (HOSPITAL_BASED_OUTPATIENT_CLINIC_OR_DEPARTMENT_OTHER)
Admission: RE | Admit: 2015-09-21 | Discharge: 2015-09-21 | Disposition: A | Discharge status: Home / Self Care | Payer: BLUE CROSS/BLUE SHIELD | Source: Ambulatory Visit | Attending: Family | Admitting: Family

## 2015-09-21 ENCOUNTER — Encounter: Payer: Self-pay | Admitting: Family

## 2015-09-21 ENCOUNTER — Telehealth: Payer: Self-pay | Admitting: Gastroenterology

## 2015-09-21 ENCOUNTER — Ambulatory Visit (INDEPENDENT_AMBULATORY_CARE_PROVIDER_SITE_OTHER): Payer: BLUE CROSS/BLUE SHIELD | Admitting: Family

## 2015-09-21 VITALS — BP 136/80 | HR 86 | Temp 97.8°F | Resp 14 | Ht 65.5 in | Wt 287.6 lb

## 2015-09-21 DIAGNOSIS — R1011 Right upper quadrant pain: Secondary | ICD-10-CM

## 2015-09-21 DIAGNOSIS — R1084 Generalized abdominal pain: Secondary | ICD-10-CM | POA: Diagnosis not present

## 2015-09-21 LAB — CBC WITH DIFFERENTIAL/PLATELET
Basophils Absolute: 0 10*3/uL (ref 0.0–0.1)
Basophils Relative: 0.5 % (ref 0.0–3.0)
EOS PCT: 5.1 % — AB (ref 0.0–5.0)
Eosinophils Absolute: 0.3 10*3/uL (ref 0.0–0.7)
HEMATOCRIT: 36.5 % (ref 36.0–46.0)
Hemoglobin: 12.4 g/dL (ref 12.0–15.0)
LYMPHS ABS: 1.7 10*3/uL (ref 0.7–4.0)
LYMPHS PCT: 26.1 % (ref 12.0–46.0)
MCHC: 34 g/dL (ref 30.0–36.0)
MCV: 83.7 fl (ref 78.0–100.0)
MONOS PCT: 14 % — AB (ref 3.0–12.0)
Monocytes Absolute: 0.9 10*3/uL (ref 0.1–1.0)
NEUTROS ABS: 3.6 10*3/uL (ref 1.4–7.7)
NEUTROS PCT: 54.3 % (ref 43.0–77.0)
Platelets: 262 10*3/uL (ref 150.0–400.0)
RBC: 4.36 Mil/uL (ref 3.87–5.11)
RDW: 15.3 % (ref 11.5–15.5)
WBC: 6.7 10*3/uL (ref 4.0–10.5)

## 2015-09-21 LAB — URINALYSIS, ROUTINE W REFLEX MICROSCOPIC
Bilirubin Urine: NEGATIVE
Ketones, ur: NEGATIVE
Leukocytes, UA: NEGATIVE
Nitrite: NEGATIVE
PH: 6 (ref 5.0–8.0)
Specific Gravity, Urine: 1.02 (ref 1.000–1.030)
TOTAL PROTEIN, URINE-UPE24: NEGATIVE
Urine Glucose: NEGATIVE
Urobilinogen, UA: 0.2 (ref 0.0–1.0)

## 2015-09-21 LAB — COMPREHENSIVE METABOLIC PANEL
ALK PHOS: 81 U/L (ref 39–117)
ALT: 17 U/L (ref 0–35)
AST: 15 U/L (ref 0–37)
Albumin: 4.2 g/dL (ref 3.5–5.2)
BILIRUBIN TOTAL: 0.4 mg/dL (ref 0.2–1.2)
BUN: 19 mg/dL (ref 6–23)
CO2: 30 mEq/L (ref 19–32)
Calcium: 9.5 mg/dL (ref 8.4–10.5)
Chloride: 105 mEq/L (ref 96–112)
Creatinine, Ser: 0.84 mg/dL (ref 0.40–1.20)
GFR: 89.64 mL/min (ref >60.00)
GLUCOSE: 90 mg/dL (ref 70–99)
Potassium: 3.9 mEq/L (ref 3.5–5.1)
SODIUM: 141 meq/L (ref 135–145)
TOTAL PROTEIN: 7.7 g/dL (ref 6.0–8.3)

## 2015-09-21 NOTE — Progress Notes (Signed)
Pre visit review using our clinic review tool, if applicable. No additional management support is needed unless otherwise documented below in the visit note. 

## 2015-09-21 NOTE — Progress Notes (Addendum)
Subjective:    Patient ID: Briana Miller, female    DOB: 1955/10/22, 60 y.o.   MRN: [id number]  HPI Comments: General abdominal pain suddenly 3 days goes 'knots and crampy', waxing and waning. Nausea, no vomiting. Not eating much 'scared to eat' due to pain. 4 days ago started new diet with protein shakes and protein bars to loose weight. Very little outside food, only liquids aside from protein shakes. Following [facility] Weight loss for several months and cleared last week through dietician to start this diet.   Eliminated fried foods and GERD resolved.   BM yesterday.     Abdominal Pain This is a new problem. The current episode started in the past 7 days. The onset quality is gradual. The problem occurs intermittently. The problem has been waxing and waning. The pain is located in the generalized abdominal region. The pain is mild. The quality of the pain is cramping. The abdominal pain does not radiate. Associated symptoms include belching, diarrhea (yesterday), flatus and nausea. Pertinent negatives include no arthralgias, constipation, dysuria, fever, headaches, hematuria, melena, myalgias or vomiting. The pain is relieved by belching (passed gas). She has tried acetaminophen (alseltzer gas) for the symptoms. The treatment provided no relief. Her past medical history is significant for GERD. There is no history of abdominal surgery, colon cancer, gallstones, irritable bowel syndrome or pancreatitis.      Review of Systems  Constitutional: Negative for fever and chills.  HENT: Negative for congestion.   Respiratory: Negative for cough and shortness of breath.   Cardiovascular: Negative for chest pain and palpitations.  Gastrointestinal: Positive for nausea, abdominal pain, diarrhea (yesterday), abdominal distention and flatus. Negative for vomiting, constipation, blood in stool and melena.  Genitourinary: Negative for dysuria and hematuria.  Musculoskeletal: Negative for myalgias  and arthralgias.  Neurological: Negative for headaches.       Objective:   Physical Exam  Constitutional: Vital signs are normal. She appears well-developed and well-nourished.  Eyes: Conjunctivae are normal.  Cardiovascular: Normal rate, regular rhythm, normal heart sounds and normal pulses.   Pulmonary/Chest: Effort normal and breath sounds normal. She has no wheezes. She has no rhonchi. She has no rales.  Abdominal: Soft. Normal appearance and bowel sounds are normal. She exhibits no distension, no fluid wave, no ascites and no mass. There is generalized tenderness. There is no rigidity, no rebound, no guarding, no CVA tenderness, no tenderness at McBurney's point and negative Murphy's sign.  Tenderness noted with deep palpation over RUQ; mild discomfort noted by patient over generalized abdomen.   Neurological: She is alert.  Skin: Skin is warm and dry.  Psychiatric: She has a normal mood and affect. Her speech is normal and behavior is normal. Thought content normal.  Vitals reviewed.         Assessment & Plan:  1. Generalized abdominal pain - Comp Met (CMET)- WNL - Urinalysis, Routine w reflex microscopic (not at [facility])- pending - CBC with Differential/Platelet- WNL  2. RUQ abdominal pain Working diagnosis is nonspecific at this time; Reassured by lab work, specifically LFTs, bilirubin, normal UA, and RUQ ultrasound. I suspect that abrupt dietary changes this week with liquid diet + protein shakes + protein bars has caused abdominal cramping and nausea. No evidence of acute process or bacterial infection at this time.   - US Abdomen Limited RUQ showed no gallstones; negative sonographic Murphy's sign.   Close surveillance of symptoms; if pain worsens or new symptoms present, advised to go to ED.  Start medications as prescribed and explained to patient on After Visit Summary ( AVS).   Education regarding symptom management and diagnosis given to patient.   Risks,  benefits, and alternatives of the medications and treatment plan prescribed today were discussed, and patient expressed understanding.  Plan follow-up as discussed or as needed if any worsening symptoms or change in condition.   Portions of this note may have been constructed with voice recognition software.  Patient and I agreed with plan.

## 2015-09-21 NOTE — Telephone Encounter (Signed)
I called the pt and she states she was seen by her PCP and an MRI was ordered and she is waiting for results.  She will call back if GI is needed.

## 2015-09-21 NOTE — Patient Instructions (Signed)
Labs on way out.   [place] at 11;15 downstairs; do not eat or drink anything.

## 2015-09-24 ENCOUNTER — Encounter: Payer: Self-pay | Admitting: Family

## 2015-09-26 ENCOUNTER — Ambulatory Visit (HOSPITAL_BASED_OUTPATIENT_CLINIC_OR_DEPARTMENT_OTHER): Admission: RE | Admit: 2015-09-26 | Payer: BLUE CROSS/BLUE SHIELD | Source: Ambulatory Visit

## 2015-10-23 ENCOUNTER — Telehealth: Payer: Self-pay | Admitting: Family

## 2015-10-23 MED ORDER — LOSARTAN POTASSIUM 50 MG PO TABS: 50.0000 mg | ORAL_TABLET | Freq: Every day | ORAL | Status: DC

## 2015-10-23 NOTE — Telephone Encounter (Signed)
[name]-- it looks like Dr Jens Som was prescribing this medication. Is it ok for [place] to send refill?  Pt also last seen by [place] 06/2015 and has no future appts scheduled. When do you want to see pt in the office?

## 2015-10-23 NOTE — Telephone Encounter (Signed)
Caller name: Pharmacy   Pharmacy:  [id number] - [place], [place] - [address] AT [facility] [address] [phone] (Phone) [phone] (Fax)         Reason for call: Request refill losartan (COZAAR) 50 MG tablet [[id number]]

## 2015-10-23 NOTE — Telephone Encounter (Signed)
Ok to send refill. Follow up in 7/17 please.

## 2015-10-23 NOTE — Telephone Encounter (Signed)
Losartan refills sent to pharmacy.  Please call pt to schedule f/u as below.  Thanks!

## 2015-10-24 NOTE — Telephone Encounter (Signed)
Left detailed message informing pt of the below. ALSO informed pt to schedule appt per PCP

## 2015-11-01 ENCOUNTER — Other Ambulatory Visit: Payer: Self-pay | Admitting: Cardiology

## 2015-11-01 NOTE — Telephone Encounter (Signed)
Rx request sent to pharmacy.  

## 2015-11-14 ENCOUNTER — Ambulatory Visit (INDEPENDENT_AMBULATORY_CARE_PROVIDER_SITE_OTHER): Payer: BLUE CROSS/BLUE SHIELD | Admitting: Gastroenterology

## 2015-11-14 ENCOUNTER — Encounter: Payer: Self-pay | Admitting: Gastroenterology

## 2015-11-14 VITALS — BP 118/78 | HR 80 | Ht 65.0 in | Wt 290.6 lb

## 2015-11-14 DIAGNOSIS — K2970 Gastritis, unspecified, without bleeding: Secondary | ICD-10-CM | POA: Diagnosis not present

## 2015-11-14 DIAGNOSIS — K2990 Gastroduodenitis, unspecified, without bleeding: Secondary | ICD-10-CM | POA: Diagnosis not present

## 2015-11-14 NOTE — Patient Instructions (Signed)
You will be set up for an upper endoscopy for h/o gastritis.

## 2015-11-14 NOTE — Progress Notes (Signed)
Review of pertinent gastrointestinal problems: 1. Family history of colon cancer; mother in her early 73s; colonoscopy Dr. Christella Hartigan 05/2013 found diverticulosis, no polyps, recommended recall at 5 years. 2. RUQ pains: clinically seemed biliary-like; 05/2013 [place] [facility] was normal; 05/2013 HIDA scan with GB EF was normal.05/2013 LFTs were normal. Was recommended, scheduled for EGD but she cancelled it.  EGD 05/2014 Dr. Christella Hartigan, mild to moderate gastritis; path showed no H. Pylori.  She had been taking BC powders twice a day (around 2013-2015). 3. Morbid obesity  HPI: This is a   very pleasant 60 year old woman whom I last saw year and a half ago the time of an upper endoscopy. See that result summarized above  Chief complaint is  history of gastritis,  Planning for bariatric procedure.  Dr. Lily Peer planning on this Sleeve procedure, he's from [facility].  He told her she needed repeat EGD given the inflammation noted on EGD 2015.  Her GERD symptoms are gone, very diet dependant. She is not on antiacid meds  She takes 2 motrin once per week.   Past Medical History  Diagnosis Date  . Arthritis   . GERD (gastroesophageal reflux disease)   . Overactive bladder   . Cardiomyopathy (HCC)   . Anemia   . CHF (congestive heart failure) (HCC)   . Hypertension     Past Surgical History  Procedure Laterality Date  . Knee arthroscopy      2007  . Total knee arthroplasty  09/22/2011    Procedure: TOTAL KNEE ARTHROPLASTY;  Surgeon: Raymon Mutton, MD;  Location: [facility] OR;  Service: Orthopedics;  Laterality: Right;  . Rotator cuff repair Right 03/23/13  . Colonoscopy      Current Outpatient Prescriptions  Medication Sig Dispense Refill  . albuterol (PROVENTIL HFA;VENTOLIN HFA) 108 (90 BASE) MCG/ACT inhaler Inhale 2 puffs into the lungs every 6 (six) hours as needed for wheezing or shortness of breath. 1 Inhaler 0  . calcium-vitamin D (OSCAL WITH D) 500-200 MG-UNIT per tablet Take 1 tablet by mouth  daily.    . furosemide (LASIX) 40 MG tablet Take 20 mg by mouth daily.    Marland Kitchen losartan (COZAAR) 50 MG tablet Take 1 tablet (50 mg total) by mouth daily. 30 tablet 3  . metoprolol succinate (TOPROL-XL) 50 MG 24 hr tablet TAKE 1 TABLET BY MOUTH EVERY DAY 90 tablet 1   No current facility-administered medications for this visit.    Allergies as of 11/14/2015  . (No Known Allergies)    Family History  Problem Relation Age of Onset  . Cancer Mother     colon 76 and breast 63- deceased  . Colon cancer Mother   . Diabetes Father     living  . Asthma Father   . Hypertension Father   . Heart attack Father 44    medical management per pt  . Glaucoma Father     had eye transplant  . Kidney failure Father     acute renal failure- died 61    Social History   Social History  . Marital Status: Married    Spouse Name: N/A  . Number of Children: 2  . Years of Education: N/A   Occupational History  .     Social History Main Topics  . Smoking status: Never Smoker   . Smokeless tobacco: Never Used  . Alcohol Use: 1.2 oz/week    2 Glasses of wine per week     Comment: None at present. Usually has  2 glasses wine daily  . Drug Use: No  . Sexual Activity: Not on file   Other Topics Concern  . Not on file   Social History Narrative   Married- 32 years   2 children- grown daughter- lives in [place]- has 2 children   Grown son lives in [place]- 4 children   Works at [employer]- works in [employer]- they Physiological scientist pumps for gas stations   Enjoys watching TV, sleeping   Completed some college           Physical Exam: BP 118/78 mmHg  Pulse 80  Ht 5\' 5"  (1.651 m)  Wt 290 lb 9.6 oz (131.815 kg)  BMI 48.36 kg/m2  LMP 06/17/2007 Constitutional: generally well-appearing Psychiatric: alert and oriented x3 Abdomen: soft, nontender, nondistended, no obvious ascites, no peritoneal signs, normal bowel sounds   Assessment and plan: 60 y.o. female with History of mild to moderate  gastritis today she explains that around the time she had the upper endoscopy a year and a half ago she had been taking twice daily BC powders. I suspect that that was the cause of her mild to moderate gastritis.  She is planning for bariatric surgery and her surgeon requested repeat EGD to confirm improvement in the gastritis. Since she is no longer taking NSAIDs I think that will be the case. I'm happy to repeat EGD at his request.   Rob Bunting, MD [facility] 11/14/2015, 10:00 AM

## 2015-11-20 ENCOUNTER — Encounter: Payer: Self-pay | Admitting: Gastroenterology

## 2015-11-28 ENCOUNTER — Encounter: Payer: Self-pay | Admitting: Gastroenterology

## 2015-11-28 ENCOUNTER — Ambulatory Visit: Payer: BLUE CROSS/BLUE SHIELD | Admitting: Gastroenterology

## 2015-11-28 VITALS — BP 151/74 | HR 84 | Temp 97.5°F | Resp 22 | Ht 65.0 in | Wt 290.0 lb

## 2015-11-28 DIAGNOSIS — K2970 Gastritis, unspecified, without bleeding: Secondary | ICD-10-CM

## 2015-11-28 MED ORDER — SODIUM CHLORIDE 0.9 % IV SOLN: 500.0000 mL | INTRAVENOUS | Status: DC

## 2015-11-28 NOTE — Progress Notes (Signed)
To pacu vss patent aw reprot to rn 

## 2015-11-28 NOTE — Op Note (Signed)
[facility] Patient Name: Briana Miller Procedure Date: 11/28/2015 3:06 PM MRN: [id number] Endoscopist: Rachael Fee , MD Age: 60 Referring MD:  Date of Birth: 05/29/1956 Gender: Female Procedure:                Upper GI endoscopy Indications:              Follow-up of gastritis (requested by her bariatric                            surgeon, Dr. Lily Peer): EGD 05/2014 Dr. Christella Hartigan,                            mild to moderate gastritis; path showed no H.                            Pylori. She had been taking BC powders twice a day                            (around 2013-2015). Medicines:                Monitored Anesthesia Care Procedure:                Pre-Anesthesia Assessment:                           - Prior to the procedure, a History and Physical                            was performed, and patient medications and                            allergies were reviewed. The patient's tolerance of                            previous anesthesia was also reviewed. The risks                            and benefits of the procedure and the sedation                            options and risks were discussed with the patient.                            All questions were answered, and informed consent                            was obtained. Prior Anticoagulants: The patient has                            taken no previous anticoagulant or antiplatelet                            agents. ASA Grade Assessment: III - A patient with  severe systemic disease. After reviewing the risks                            and benefits, the patient was deemed in                            satisfactory condition to undergo the procedure.                           After obtaining informed consent, the endoscope was                            passed under direct vision. Throughout the                            procedure, the patient's blood pressure, pulse, and                          oxygen saturations were monitored continuously. The                            Model GIF-[id number] scope was introduced                            through the mouth, and advanced to the second part                            of duodenum. The upper GI endoscopy was                            accomplished without difficulty. The patient                            tolerated the procedure well. Scope In: Scope Out: Findings:                 A medium amount of food (residue) was found in the                            gastric antrum.                           The exam was otherwise without abnormality. Complications:            No immediate complications. Estimated blood loss:                            None. Estimated Blood Loss:     Estimated blood loss: none. Impression:               - A medium amount of food (residue) in the stomach.                           - The examination was otherwise normal.                           -  No specimens collected. Recommendation:           - Patient has a contact number available for                            emergencies. The signs and symptoms of potential                            delayed complications were discussed with the                            patient. Return to normal activities tomorrow.                            Written discharge instructions were provided to the                            patient.                           - Resume previous diet.                           - Continue present medications.                           - No repeat upper endoscopy. Rachael Fee, MD 11/28/2015 3:39:04 PM This report has been signed electronically.

## 2015-11-28 NOTE — Patient Instructions (Signed)
YOU HAD AN ENDOSCOPIC PROCEDURE TODAY AT THE [facility]:   Refer to the procedure report that was given to you for any specific questions about what was found during the examination.  If the procedure report does not answer your questions, please call your gastroenterologist to clarify.  If you requested that your care partner not be given the details of your procedure findings, then the procedure report has been included in a sealed envelope for you to review at your convenience later.  YOU SHOULD EXPECT: Some feelings of bloating in the abdomen. Passage of more gas than usual.  Walking can help get rid of the air that was put into your GI tract during the procedure and reduce the bloating. If you had a lower endoscopy (such as a colonoscopy or flexible sigmoidoscopy) you may notice spotting of blood in your stool or on the toilet paper. If you underwent a bowel prep for your procedure, you may not have a normal bowel movement for a few days.  Please Note:  You might notice some irritation and congestion in your nose or some drainage.  This is from the oxygen used during your procedure.  There is no need for concern and it should clear up in a day or so.  SYMPTOMS TO REPORT IMMEDIATELY:    Following upper endoscopy (EGD)  Vomiting of blood or coffee ground material  New chest pain or pain under the shoulder blades  Painful or persistently difficult swallowing  New shortness of breath  Fever of 100F or higher  Black, tarry-looking stools  For urgent or emergent issues, a gastroenterologist can be reached at any hour by calling [phone].   DIET: Your first meal following the procedure should be a small meal and then it is ok to progress to your normal diet. Heavy or fried foods are harder to digest and may make you feel nauseous or bloated.  Likewise, meals heavy in dairy and vegetables can increase bloating.  Drink plenty of fluids but you should avoid alcoholic beverages  for 24 hours.  ACTIVITY:  You should plan to take it easy for the rest of today and you should NOT DRIVE or use heavy machinery until tomorrow (because of the sedation medicines used during the test).    FOLLOW UP: Our staff will call the number listed on your records the next business day following your procedure to check on you and address any questions or concerns that you may have regarding the information given to you following your procedure. If we do not reach you, we will leave a message.  However, if you are feeling well and you are not experiencing any problems, there is no need to return our call.  We will assume that you have returned to your regular daily activities without incident.  If any biopsies were taken you will be contacted by phone or by letter within the next 1-3 weeks.  Please call us at [phone] if you have not heard about the biopsies in 3 weeks.    SIGNATURES/CONFIDENTIALITY: You and/or your care partner have signed paperwork which will be entered into your electronic medical record.  These signatures attest to the fact that that the information above on your After Visit Summary has been reviewed and is understood.  Full responsibility of the confidentiality of this discharge information lies with you and/or your care-partner. 

## 2015-11-29 ENCOUNTER — Telehealth: Payer: Self-pay

## 2015-11-29 NOTE — Telephone Encounter (Signed)
Unable to leave message mailbox full.

## 2015-12-11 DIAGNOSIS — Z96651 Presence of right artificial knee joint: Secondary | ICD-10-CM | POA: Insufficient documentation

## 2015-12-11 HISTORY — DX: Presence of right artificial knee joint: Z96.651

## 2016-01-03 ENCOUNTER — Ambulatory Visit: Payer: BLUE CROSS/BLUE SHIELD | Attending: Orthopedic Surgery | Admitting: Physical Therapy

## 2016-01-03 DIAGNOSIS — M25661 Stiffness of right knee, not elsewhere classified: Secondary | ICD-10-CM | POA: Diagnosis present

## 2016-01-03 DIAGNOSIS — M6281 Muscle weakness (generalized): Secondary | ICD-10-CM | POA: Insufficient documentation

## 2016-01-03 DIAGNOSIS — R2689 Other abnormalities of gait and mobility: Secondary | ICD-10-CM | POA: Diagnosis present

## 2016-01-03 DIAGNOSIS — M25561 Pain in right knee: Secondary | ICD-10-CM | POA: Insufficient documentation

## 2016-01-03 DIAGNOSIS — R262 Difficulty in walking, not elsewhere classified: Secondary | ICD-10-CM | POA: Diagnosis present

## 2016-01-03 NOTE — Therapy (Signed)
[facility] Outpatient Rehabilitation [facility] [address]  [address] [place], [place], [address] Phone: [phone]   Fax:  [phone]  Physical Therapy Evaluation  Patient Details  Name: Briana Miller MRN: [id number] Date of Birth: 04/01/1956 Referring Provider: Laurier Nancy, PA-C for Dannielle Huh, MD  Encounter Date: 01/03/2016      PT End of Session - 01/03/16 1024    Visit Number 1   Number of Visits 20   Date for PT Re-Evaluation 03/13/16   PT Start Time 0940   PT Stop Time 1021   PT Time Calculation (min) 41 min   Activity Tolerance Patient limited by pain   Behavior During Therapy [facility] for tasks assessed/performed      Past Medical History  Diagnosis Date  . Arthritis   . GERD (gastroesophageal reflux disease)   . Overactive bladder   . Cardiomyopathy (HCC)   . Anemia   . CHF (congestive heart failure) (HCC)   . Hypertension   . Allergy   . Hyperlipidemia     Past Surgical History  Procedure Laterality Date  . Knee arthroscopy      2007  . Total knee arthroplasty  09/22/2011    Procedure: TOTAL KNEE ARTHROPLASTY;  Surgeon: Raymon Mutton, MD;  Location: [facility] OR;  Service: Orthopedics;  Laterality: Right;  . Rotator cuff repair Right 03/23/13  . Colonoscopy      There were no vitals filed for this visit.       Subjective Assessment - 01/03/16 0945    Subjective Pt reports increasing R knee pain starting ~1 month ago. Then on 12/15/15, she missed a step landing hard on R LE with pull felt on outside of R knee. Pain worst in anterior knee and affects ability to walk or drive a car. Pt also limited by severe OA in L knee in need of TKR, but MD wanting her to lose weight before he does surgery.   Pertinent History R TKR 09/22/2011; in need of L TKR d/t severe L knee OA   Patient Stated Goals "Walk with less pain"   Currently in Pain? Yes   Pain Score 10-Worst pain ever  Least 9/10, Avg/Worst 10/10   Pain Location Knee   Pain Orientation  Right;Anterior   Pain Descriptors / Indicators Tightness;Pressure   Pain Type Acute pain   Pain Onset More than a month ago   Pain Frequency Constant   Aggravating Factors  walking   Pain Relieving Factors nothing   Effect of Pain on Daily Activities limits walking tolerance, difficulty getting in/out of car, has missed several days of work d/t knee pain            [facility] PT Assessment - 01/03/16 0940    Assessment   Medical Diagnosis R knee pain   Referring Provider Laurier Nancy, PA-C for Dannielle Huh, MD   Onset Date/Surgical Date --  1 month   Next MD Visit TBD   Prior Therapy PT after R TKR in 09/2011   Balance Screen   Has the patient fallen in the past 6 months No   Has the patient had a decrease in activity level because of a fear of falling?  Yes   Is the patient reluctant to leave their home because of a fear of falling?  Yes   Home Environment   Living Environment Private residence   Home Equipment Walker - 2 wheels;Cane - single point;Toilet riser;Bedside commode   Prior Function  Level of Independence Independent with household mobility with device;Independent with community mobility with device;Requires assistive device for independence  using cane due to L knee OA   Vocation Full time employment   Vocation Requirements sitting 90% of day, walking on concrete floors   Observation/Other Assessments   Focus on Therapeutic Outcomes (FOTO)  Knee 1% (99% limitation); predicted 38% (62% limitation)   ROM / Strength   AROM / PROM / Strength AROM;PROM;Strength   AROM   AROM Assessment Site Knee   Right/Left Knee Right;Left   Right Knee Extension 34   Right Knee Flexion 99   PROM   Overall PROM Comments measured in sitting   PROM Assessment Site Knee   Right/Left Knee Right   Right Knee Extension 6   Strength   Strength Assessment Site Hip;Knee   Right/Left Hip Right;Left   Right Hip Flexion 2+/5   Right Hip Extension 2/5   Right Hip ABduction 4-/5   Right Hip  ADduction 3-/5   Left Hip Flexion 3-/5   Left Hip Extension 2+/5   Left Hip ABduction 4/5   Left Hip ADduction 3/5   Right/Left Knee Right;Left   Right Knee Flexion 3+/5   Right Knee Extension 3-/5   Left Knee Flexion 3+/5   Left Knee Extension 3-/5   Transfers   Transfers Sit to Stand;Stand to Sit   Sit to Stand 4: Min guard   Stand to Sit 4: Min guard   Comments Pt required max UE assist to slowly acheive sit to stand from chair with armrests   Ambulation/Gait   Ambulation Distance (Feet) 60 Feet   Assistive device Straight cane;Rolling walker   Gait Pattern Antalgic;Trunk flexed;Lateral trunk lean to right;Lateral trunk lean to left;Decreased step length - right;Decreased step length - left;Decreased hip/knee flexion - right;Decreased hip/knee flexion - left;Decreased weight shift to right;Decreased weight shift to left;Poor foot clearance - left;Poor foot clearance - right   Ambulation Surface Level   Gait Comments Pt very unsteady/unsafe with SPC, therefore introduced RW with slightly improved step length and abilty to clear feet. Instructed pt to begin using her RW at all times until otherwise directed by PT.         Today's Treatment  Kinesiotaping to R knee - 3 "I" strips   2 strips (30%) medial and lateral to patella crossing at tibial tuberosity & distal quad   1 strip (50%) across inferior pole of patella/patellar tendon           PT Short Term Goals - 01/03/16 1020    PT SHORT TERM GOAL #1   Title Independent with initial HEP by 01/31/16   Status New   PT SHORT TERM GOAL #2   Title Pt will transfer sit <> stand from standard height chair with armrests with mod UE effort by 01/31/16   Status New   PT SHORT TERM GOAL #3   Title Pt will demonstrate safe ambulation with normalized gait pattern using RW by 01/31/16   Status New   PT SHORT TERM GOAL #4   Title Pt will demonstrate improved gait speed to at least (value TBD upon completion of gait speed assessment)  by 01/31/16   Status New           PT Long Term Goals - 01/03/16 1021    PT LONG TERM GOAL #1   Title Independent with advanced HEP as indicated by 03/13/16   Status New   PT LONG TERM GOAL #2  Title R knee AROM at least 5-115 by 03/13/16   Status New   PT LONG TERM GOAL #3   Title R knee strength >/= 4/5 by 03/13/16   Status New   PT LONG TERM GOAL #4   Title B hip strength >/= 4-/5 by 03/13/16   Status New   PT LONG TERM GOAL #5   Title Pt will ambulate with normal gait pattern with SPC by 03/13/16   Status New               Plan - 01/03/16 1020    Clinical Impression Statement Briana Miller is a morbidly obese 60 y/o female who presents to OP PT with ~1 month h/o worsening R knee pain, exacerbated by misstep and hard landing on R LE on 12/15/15. Pt is s/p R TKR in 09/2011 and also has severe L knee OA in need of TKR but surgeon wants her to lose weight before doing surgery. Pt presents to PT using a SPC on L for gait with severely antalgic gait pattern with decreased hip and knee flexion bilaterally resulting in very short step length, increased lateral trunk sway and poor foot clearance bilaterally and creating very unsteady/unsafe gait. Pt encouraged to use her RW in place of the cane until otherwise directed by PT. Pt reports pain at time of eval was 10/10 ("50/10" if she could rate it that high) which pt states is mostly constant at this level, with least pain rated at 9/10. Assessment revealed significantly limited R knee AROM to 34-99 with extension ROM limited to 6 degrees when limb supported. Unable to assess LE flexibility and B LE strength tested in sitting due to pt intolerance for transfer to/from mat table, with pt demonstrating significant weakness in B hips and knees (refer to above MMT).  Overall assessment limited by pt's late arrival to appt, very slow movements and limited tolerance for positioning to adequately complete assessment, therefore will attempt to complete  assessment as able at next visit. POC will focus on improving LE soft tissue pliability along with core/LE strengthening and stability training, gait and stair training, with manual therapy and modalities PRN for pain. Initial trial of kinesiotaping applied to R knee in effort to reduce pain and swelling for improved therapy tolerance at next visit.   Clinical Impairments Affecting Rehab Potential Obesity, severe L knee OA, h/o R TKR, CHF and congested DCM, PVD with venous statis ulcer on R anterior shin, HTN, borderline DM   PT Frequency 3x / week  will consider decrease to 2x/wk after 4 weeks pending progress   PT Duration 8 weeks   PT Treatment/Interventions Patient/family education;Taping;Manual techniques;Passive range of motion;Therapeutic exercise;Therapeutic activities;Functional mobility training;Gait training;Stair training;Neuromuscular re-education;Balance training;Electrical Stimulation;Iontophoresis 4mg /ml Dexamethasone;ADLs/Self Care Home Management   PT Next Visit Plan Complete initial assessment including home environment, flexibility, gait speed; Review proper gait pattern with RW; Assess response to taping; Create initial HEP as able; Modalities PRN for pain management   Consulted and Agree with Plan of Care Patient      Patient will benefit from skilled therapeutic intervention in order to improve the following deficits and impairments:  Pain, Decreased range of motion, Decreased strength, Postural dysfunction, Improper body mechanics, Impaired perceived functional ability, Difficulty walking, Abnormal gait, Decreased knowledge of use of DME, Decreased balance, Decreased activity tolerance  Visit Diagnosis: Pain in right knee - Plan: PT plan of care cert/re-cert  Stiffness of right knee, not elsewhere classified - Plan: PT plan of care cert/re-cert  Difficulty in walking, not elsewhere classified - Plan: PT plan of care cert/re-cert  Other abnormalities of gait and mobility -  Plan: PT plan of care cert/re-cert  Muscle weakness (generalized) - Plan: PT plan of care cert/re-cert     Problem List Patient Active Problem List   Diagnosis Date Noted  . Preop cardiovascular exam 09/12/2015  . Venous stasis ulcers (HCC) 06/23/2015  . Overactive bladder 06/23/2015  . GERD (gastroesophageal reflux disease) 06/23/2015  . CHF (congestive heart failure) (HCC) 05/15/2015  . Preventative health care 03/23/2015  . Migraines 03/23/2015  . Borderline diabetes 05/18/2014  . Acute systolic congestive heart failure (HCC) 03/01/2014  . HTN (hypertension) 06/02/2013  . Allergic rhinitis 06/01/2013  . Congestive dilated cardiomyopathy (HCC) 06/01/2013  . OSA (obstructive sleep apnea) 04/08/2013  . Family history of colon cancer 04/08/2013  . Severe obesity (BMI >= 40) (HCC) 04/08/2013    Marry Guan, PT, MPT 01/03/2016, 6:35 PM  [facility] [address]  [address] [place], [place], [address] Phone: [phone]   Fax:  [phone]  Name: Briana Miller MRN: [id number] Date of Birth: [birth year]

## 2016-01-04 ENCOUNTER — Other Ambulatory Visit: Payer: Self-pay

## 2016-01-04 ENCOUNTER — Encounter (INDEPENDENT_AMBULATORY_CARE_PROVIDER_SITE_OTHER): Payer: Self-pay

## 2016-01-04 ENCOUNTER — Ambulatory Visit (HOSPITAL_COMMUNITY): Payer: BLUE CROSS/BLUE SHIELD | Attending: Cardiovascular Disease

## 2016-01-04 DIAGNOSIS — I509 Heart failure, unspecified: Secondary | ICD-10-CM | POA: Diagnosis not present

## 2016-01-04 DIAGNOSIS — I340 Nonrheumatic mitral (valve) insufficiency: Secondary | ICD-10-CM | POA: Diagnosis not present

## 2016-01-04 DIAGNOSIS — I429 Cardiomyopathy, unspecified: Secondary | ICD-10-CM | POA: Diagnosis present

## 2016-01-04 DIAGNOSIS — E785 Hyperlipidemia, unspecified: Secondary | ICD-10-CM | POA: Insufficient documentation

## 2016-01-04 DIAGNOSIS — Z8249 Family history of ischemic heart disease and other diseases of the circulatory system: Secondary | ICD-10-CM | POA: Diagnosis not present

## 2016-01-04 DIAGNOSIS — I110 Hypertensive heart disease with heart failure: Secondary | ICD-10-CM | POA: Diagnosis not present

## 2016-01-08 ENCOUNTER — Ambulatory Visit: Payer: BLUE CROSS/BLUE SHIELD | Admitting: Physical Therapy

## 2016-01-08 DIAGNOSIS — M25561 Pain in right knee: Secondary | ICD-10-CM | POA: Diagnosis not present

## 2016-01-08 DIAGNOSIS — M25661 Stiffness of right knee, not elsewhere classified: Secondary | ICD-10-CM

## 2016-01-08 DIAGNOSIS — R262 Difficulty in walking, not elsewhere classified: Secondary | ICD-10-CM

## 2016-01-08 DIAGNOSIS — M6281 Muscle weakness (generalized): Secondary | ICD-10-CM

## 2016-01-08 DIAGNOSIS — R2689 Other abnormalities of gait and mobility: Secondary | ICD-10-CM

## 2016-01-08 NOTE — Therapy (Signed)
[facility] Outpatient Rehabilitation [facility] [address]  [address] [place], [place], [address] Phone: [phone]   Fax:  [phone]  Physical Therapy Treatment  Patient Details  Name: Briana Miller MRN: [id number] Date of Birth: Jan 31, 1956 Referring Provider: Laurier Nancy, PA-C for Dannielle Huh, MD  Encounter Date: 01/08/2016      PT End of Session - 01/08/16 1332    Visit Number 2   Number of Visits 20   Date for PT Re-Evaluation 03/13/16   PT Start Time 1332  Pt arrived late   PT Stop Time 1422   PT Time Calculation (min) 50 min   Activity Tolerance Patient limited by pain   Behavior During Therapy [facility] for tasks assessed/performed      Past Medical History:  Diagnosis Date  . Allergy   . Anemia   . Arthritis   . Cardiomyopathy (HCC)   . CHF (congestive heart failure) (HCC)   . GERD (gastroesophageal reflux disease)   . Hyperlipidemia   . Hypertension   . Overactive bladder     Past Surgical History:  Procedure Laterality Date  . COLONOSCOPY    . KNEE ARTHROSCOPY     2007  . ROTATOR CUFF REPAIR Right 03/23/13  . TOTAL KNEE ARTHROPLASTY  09/22/2011   Procedure: TOTAL KNEE ARTHROPLASTY;  Surgeon: Raymon Mutton, MD;  Location: [facility] OR;  Service: Orthopedics;  Laterality: Right;    There were no vitals filed for this visit.      Subjective Assessment - 01/08/16 1332    Subjective Pt noting increased ease of ambulation with switch to RW but notes pain in palms from heavy lean on RW. Pt also noting some relief from taping for R knee on initial visit.   Pertinent History R TKR 09/22/2011; in need of L TKR d/t severe L knee OA   Patient Stated Goals "Walk with less pain"   Currently in Pain? Yes   Pain Score 10-Worst pain ever   Pain Onset More than a month ago            [facility] PT Assessment - 01/08/16 1332      AROM   AROM Assessment Site Knee   Right/Left Knee Right;Left   Right Knee Flexion 80  measured in supine   Left Knee  Extension 14   Left Knee Flexion 86     PROM   Right Knee Extension 0  measured in supine with leg supported     Flexibility   Soft Tissue Assessment /Muscle Length yes   Hamstrings moderate tightness bilaterally   Quadriceps moderate tightness in hip flexors and RF L>R     Transfers   Transfers Sit to Stand;Stand to Sit;Supine to Sit;Sit to Supine   Sit to Stand 2: Max assist;With upper extremity assist  from mat table; mod assist from chair w/ armrests   Sit to Stand Details Verbal cues for sequencing;Verbal cues for technique;Tactile cues for weight shifting;Manual facilitation for weight shifting   Stand to Sit 4: Min guard;With upper extremity assist;Uncontrolled descent  to mat table (no armrests)   Comments Able to perform sit <> stand from elevated seat height + armrests with supervision     Ambulation/Gait   Assistive device Rolling walker   Gait Pattern Step-to pattern;Decreased step length - right;Decreased step length - left;Decreased hip/knee flexion - right;Decreased hip/knee flexion - left;Trunk flexed;Poor foot clearance - left;Poor foot clearance - right   Ambulation Surface Level;Indoor   Gait velocity 0.56  ft/sec  58.93"        Today's Treatment  Completion of initial assessment as above  Gait Assessment of RW height adjustment and cues for upright posture to reduce UE weightbearing on RW to reduce hand pain.  Gait training with cues for increased step length, increased hip and knee flexion, and proper heel strike on weight acceptance.  Modalities Pre-mod (10-20 Hz)  to B knees in sitting (due to difficulty getting up/down from therapy mat) - intensity to pt tolerance x15'          PT Short Term Goals - 01/08/16 1422      PT SHORT TERM GOAL #1   Title Independent with initial HEP by 01/31/16   Status On-going     PT SHORT TERM GOAL #2   Title Pt will transfer sit <> stand from standard height chair with armrests with mod UE effort by 01/31/16    Status On-going     PT SHORT TERM GOAL #3   Title Pt will demonstrate safe ambulation with normalized gait pattern using RW by 01/31/16   Status On-going     PT SHORT TERM GOAL #4   Title Pt will demonstrate improved gait speed to at least 1.25 ft/sec by 01/31/16   Status On-going           PT Long Term Goals - 01/08/16 1422      PT LONG TERM GOAL #1   Title Independent with advanced HEP as indicated by 03/13/16   Status On-going     PT LONG TERM GOAL #2   Title R knee AROM at least 5-115 by 03/13/16   Status On-going     PT LONG TERM GOAL #3   Title R knee strength >/= 4/5 by 03/13/16   Status On-going     PT LONG TERM GOAL #4   Title B hip strength >/= 4-/5 by 03/13/16   Status On-going     PT LONG TERM GOAL #5   Title Pt will ambulate with normal gait pattern with SPC by 03/13/16   Status On-going     PT LONG TERM GOAL #6   Title Pt will demonstrate improved gait speed to >/= 2.0 ft/sec by 03/13/16   Status New               Plan - 01/08/16 1422    Clinical Impression Statement Pt presents to therapy today with RW rather than cane noting increased ease of walking but increased pain in palms from leaning heavily on RW. Corrected posture and provided cues for proper gait pattern including increased hip and knee flexion and heel strike on weight acceptance. Due to late arrival to visit, remainder of visit focused on completing initial assessment which was limited on the eval also due to late arrival. Unable to repeat taping today despite report of positive reponse to initial taping as pt's pants restricted access to knee, therefore initiated trial of estim for pain management. If positive reponse noted, will provide info for home TENS unit at next visit.   PT Next Visit Plan Assess response to estim - if benefit noted, provide info for home TENS unit; Review proper gait pattern with RW; Create initial HEP for B hip/knee strengthening as able; Modalities PRN for pain  management; Taping for pain/edma management   Consulted and Agree with Plan of Care Patient      Patient will benefit from skilled therapeutic intervention in order to improve the following deficits and impairments:  Pain, Decreased range of motion, Decreased strength, Postural dysfunction, Improper body mechanics, Impaired perceived functional ability, Difficulty walking, Abnormal gait, Decreased knowledge of use of DME, Decreased balance, Decreased activity tolerance  Visit Diagnosis: Pain in right knee  Stiffness of right knee, not elsewhere classified  Difficulty in walking, not elsewhere classified  Other abnormalities of gait and mobility  Muscle weakness (generalized)     Problem List Patient Active Problem List   Diagnosis Date Noted  . Preop cardiovascular exam 09/12/2015  . Venous stasis ulcers (HCC) 06/23/2015  . Overactive bladder 06/23/2015  . GERD (gastroesophageal reflux disease) 06/23/2015  . CHF (congestive heart failure) (HCC) 05/15/2015  . Preventative health care 03/23/2015  . Migraines 03/23/2015  . Borderline diabetes 05/18/2014  . Acute systolic congestive heart failure (HCC) 03/01/2014  . HTN (hypertension) 06/02/2013  . Allergic rhinitis 06/01/2013  . Congestive dilated cardiomyopathy (HCC) 06/01/2013  . OSA (obstructive sleep apnea) 04/08/2013  . Family history of colon cancer 04/08/2013  . Severe obesity (BMI >= 40) (HCC) 04/08/2013    Marry Guan, PT, MPT 01/08/2016, 6:09 PM  [facility] [address]  [address] [place], [place], [address] Phone: [phone]   Fax:  [phone]  Name: Briana Miller MRN: [id number] Date of Birth: 18-Jul-1955

## 2016-01-10 ENCOUNTER — Ambulatory Visit: Payer: BLUE CROSS/BLUE SHIELD

## 2016-01-10 DIAGNOSIS — M25561 Pain in right knee: Secondary | ICD-10-CM | POA: Diagnosis not present

## 2016-01-10 DIAGNOSIS — R262 Difficulty in walking, not elsewhere classified: Secondary | ICD-10-CM

## 2016-01-10 DIAGNOSIS — R2689 Other abnormalities of gait and mobility: Secondary | ICD-10-CM

## 2016-01-10 DIAGNOSIS — M25661 Stiffness of right knee, not elsewhere classified: Secondary | ICD-10-CM

## 2016-01-10 DIAGNOSIS — M6281 Muscle weakness (generalized): Secondary | ICD-10-CM

## 2016-01-10 NOTE — Therapy (Addendum)
[facility] [address]  [place], [place], [address] Phone: [phone]   Fax:  [phone]  Physical Therapy Treatment  Patient Details  Name: Briana Miller MRN: [id number] Date of Birth: March 09, 1956 Referring Provider: Carlyon Shadow, PA-C for Vickey Huger, MD  Encounter Date: 01/10/2016      PT End of Session - 01/10/16 1129    Visit Number 3   Number of Visits 20   Date for PT Re-Evaluation 03/13/16   PT Start Time 1120  pt. arrived 20 min late to therapy.   PT Stop Time 1145   PT Time Calculation (min) 25 min   Activity Tolerance Patient limited by pain   Behavior During Therapy [facility] for tasks assessed/performed      Past Medical History:  Diagnosis Date  . Allergy   . Anemia   . Arthritis   . Cardiomyopathy ([place])   . CHF (congestive heart failure) ([place])   . GERD (gastroesophageal reflux disease)   . Hyperlipidemia   . Hypertension   . Overactive bladder     Past Surgical History:  Procedure Laterality Date  . COLONOSCOPY    . KNEE ARTHROSCOPY     2007  . ROTATOR CUFF REPAIR Right 03/23/13  . TOTAL KNEE ARTHROPLASTY  09/22/2011   Procedure: TOTAL KNEE ARTHROPLASTY;  Surgeon: Rudean Haskell, MD;  Location: [facility];  Service: Orthopedics;  Laterality: Right;    There were no vitals filed for this visit.      Subjective Assessment - 01/10/16 1116    Subjective Pt. reports she fell sideways into the open car while trying to remove her RW before entering therapy today; pt. lowered herself down with her L shoulder shoulder which is currently in pain; pt. did not hi the ground; Pt. reports a 9/10 knee pain initially today.  After further discussion, pt. said she has not taken her dieretic for the last week.     Patient Stated Goals "Walk with less pain"   Currently in Pain? Yes   Pain Score 9    Pain Location Knee   Pain Orientation Right;Anterior   Pain Descriptors / Indicators Tightness;Pressure    Pain Type Acute pain   Pain Frequency Constant   Aggravating Factors  walking    Multiple Pain Sites No        Today's treatment:  Therex: NuStep: level 1, 4 min  Supine R SAQ 5" x 10 reps  Supine R heel slides x 15 reps  Supine R hip abd/add x 10 reps each way Sit<> stand x 3 with RW; pt. required increased time for all sit>stand due to weakness and apprehension         PT Short Term Goals - 01/08/16 1422      PT SHORT TERM GOAL #1   Title Independent with initial HEP by 01/31/16   Status On-going     PT SHORT TERM GOAL #2   Title Pt will transfer sit <> stand from standard height chair with armrests with mod UE effort by 01/31/16   Status On-going     PT SHORT TERM GOAL #3   Title Pt will demonstrate safe ambulation with normalized gait pattern using RW by 01/31/16   Status On-going     PT SHORT TERM GOAL #4   Title Pt will demonstrate improved gait speed to at least 1.25 ft/sec by 01/31/16   Status On-going  PT Long Term Goals - 01/08/16 1422      PT LONG TERM GOAL #1   Title Independent with advanced HEP as indicated by 03/13/16   Status On-going     PT LONG TERM GOAL #2   Title R knee AROM at least 5-115 by 03/13/16   Status On-going     PT LONG TERM GOAL #3   Title R knee strength >/= 4/5 by 03/13/16   Status On-going     PT LONG TERM GOAL #4   Title B hip strength >/= 4-/5 by 03/13/16   Status On-going     PT LONG TERM GOAL #5   Title Pt will ambulate with normal gait pattern with SPC by 03/13/16   Status On-going     PT LONG TERM GOAL #6   Title Pt will demonstrate improved gait speed to >/= 2.0 ft/sec by 03/13/16   Status New               Plan - 01/10/16 1133    Clinical Impression Statement Pt. reports she fell sideways into the open car while trying to remove her RW before entering therapy today; pt. lowered herself down with her L shoulder which is currently in pain; pt. did not hit the ground; Pt. reports a 9/10 R knee  pain and a weakened L LE initially today.  After further discussion, pt. said she has not taken her dieretic for the last week.  Pt. Reports she fell because the L knee, "buckled" under her.  Pt. was very limited by pain today and arrived 20 min late to therapy; today's therex was conservative due to fall.  cardiologist messaged regarding pt. not taking dieretic medication; plan to create initial HEP next treatment.      Clinical Impairments Affecting Rehab Potential Obesity, severe L knee OA, h/o R TKR, CHF and congested DCM, PVD with venous statis ulcer on R anterior shin, HTN, borderline DM   PT Treatment/Interventions Patient/family education;Taping;Manual techniques;Passive range of motion;Therapeutic exercise;Therapeutic activities;Functional mobility training;Gait training;Stair training;Neuromuscular re-education;Balance training;Electrical Stimulation;Iontophoresis 3m/ml Dexamethasone;ADLs/Self Care Home Management   PT Next Visit Plan Review proper gait pattern with RW; Create initial HEP for B hip/knee strengthening as able; Modalities PRN for pain management; Taping for pain/edma management      Patient will benefit from skilled therapeutic intervention in order to improve the following deficits and impairments:  Pain, Decreased range of motion, Decreased strength, Postural dysfunction, Improper body mechanics, Impaired perceived functional ability, Difficulty walking, Abnormal gait, Decreased knowledge of use of DME, Decreased balance, Decreased activity tolerance  Visit Diagnosis: Pain in right knee  Stiffness of right knee, not elsewhere classified  Difficulty in walking, not elsewhere classified  Other abnormalities of gait and mobility  Muscle weakness (generalized)     Problem List Patient Active Problem List   Diagnosis Date Noted  . Preop cardiovascular exam 09/12/2015  . Venous stasis ulcers ([place] 06/23/2015  . Overactive bladder 06/23/2015  . GERD (gastroesophageal  reflux disease) 06/23/2015  . CHF (congestive heart failure) ([facility] 05/15/2015  . Preventative health care 03/23/2015  . Migraines 03/23/2015  . Borderline diabetes 05/18/2014  . Acute systolic congestive heart failure ([facility] 03/01/2014  . HTN (hypertension) 06/02/2013  . Allergic rhinitis 06/01/2013  . Congestive dilated cardiomyopathy ([facility] 06/01/2013  . OSA (obstructive sleep apnea) 04/08/2013  . Family history of colon cancer 04/08/2013  . Severe obesity (BMI >= 40) ([place] [facility] 04/08/2013    MBess Harvest PTA 01/10/2016, 1:11 PM    Outpatient Rehabilitation [facility] [address]  [place], [place], [address] Phone: [phone]   Fax:  [phone]  Name: Briana Miller MRN: [id number] Date of Birth: June 27, 1955   PHYSICAL THERAPY DISCHARGE SUMMARY  Visits from Start of Care: 3  Current functional level related to goals / functional outcomes:   Pt admitted to ER for R displaced patellar fracture. Pursuing SNF for rehab, therefore will discharge from PT for this episode.   Remaining deficits:   Unable to assess secondary to above.   Education / Equipment:   Personnel officer with RW   Plan: Patient agrees to discharge.  Patient goals were not met. Patient is being discharged due to a change in medical status.  ?????    Percival Spanish, PT, MPT 01/14/16, 10:04 AM  [facility] [address]  [place], [place], [address] Phone: [phone]   Fax:  [phone]

## 2016-01-11 ENCOUNTER — Ambulatory Visit: Payer: BLUE CROSS/BLUE SHIELD

## 2016-01-12 ENCOUNTER — Encounter (HOSPITAL_COMMUNITY): Payer: Self-pay | Admitting: Emergency Medicine

## 2016-01-12 ENCOUNTER — Other Ambulatory Visit: Payer: Self-pay

## 2016-01-12 ENCOUNTER — Emergency Department (HOSPITAL_COMMUNITY): Payer: BLUE CROSS/BLUE SHIELD

## 2016-01-12 ENCOUNTER — Emergency Department (HOSPITAL_COMMUNITY)
Admission: EM | Admit: 2016-01-12 | Discharge: 2016-01-16 | Disposition: A | Discharge status: Skilled Nursing Facility | Payer: BLUE CROSS/BLUE SHIELD | Attending: Emergency Medicine | Admitting: Emergency Medicine

## 2016-01-12 DIAGNOSIS — I509 Heart failure, unspecified: Secondary | ICD-10-CM | POA: Insufficient documentation

## 2016-01-12 DIAGNOSIS — Y92009 Unspecified place in unspecified non-institutional (private) residence as the place of occurrence of the external cause: Secondary | ICD-10-CM | POA: Diagnosis not present

## 2016-01-12 DIAGNOSIS — Y939 Activity, unspecified: Secondary | ICD-10-CM | POA: Diagnosis not present

## 2016-01-12 DIAGNOSIS — I110 Hypertensive heart disease with heart failure: Secondary | ICD-10-CM | POA: Insufficient documentation

## 2016-01-12 DIAGNOSIS — S82041A Displaced comminuted fracture of right patella, initial encounter for closed fracture: Secondary | ICD-10-CM | POA: Insufficient documentation

## 2016-01-12 DIAGNOSIS — S8991XA Unspecified injury of right lower leg, initial encounter: Secondary | ICD-10-CM | POA: Diagnosis present

## 2016-01-12 DIAGNOSIS — Z96651 Presence of right artificial knee joint: Secondary | ICD-10-CM | POA: Diagnosis not present

## 2016-01-12 DIAGNOSIS — Y999 Unspecified external cause status: Secondary | ICD-10-CM | POA: Insufficient documentation

## 2016-01-12 DIAGNOSIS — Z79899 Other long term (current) drug therapy: Secondary | ICD-10-CM | POA: Insufficient documentation

## 2016-01-12 DIAGNOSIS — R531 Weakness: Secondary | ICD-10-CM | POA: Diagnosis not present

## 2016-01-12 DIAGNOSIS — S82001A Unspecified fracture of right patella, initial encounter for closed fracture: Secondary | ICD-10-CM

## 2016-01-12 DIAGNOSIS — W19XXXA Unspecified fall, initial encounter: Secondary | ICD-10-CM | POA: Diagnosis not present

## 2016-01-12 MED ORDER — FENTANYL CITRATE (PF) 100 MCG/2ML IJ SOLN
50.0000 ug | Freq: Once | INTRAMUSCULAR | Status: AC
  Administered 2016-01-12: 50 ug via INTRAVENOUS
  Filled 2016-01-12: qty 2

## 2016-01-12 MED ORDER — SODIUM CHLORIDE 0.9 % IV BOLUS (SEPSIS): 1000.0000 mL | Freq: Once | INTRAVENOUS | Status: DC

## 2016-01-12 NOTE — ED Triage Notes (Signed)
Patient here from home with 2 day history of leg weakness.  Patient has fallen a few times at home.  Patient is hypotensive, which is not normal for her.  Patient is CAOx4, no pain, no nausea or vomiting.

## 2016-01-12 NOTE — ED Notes (Signed)
Patient transported to X-ray 

## 2016-01-12 NOTE — ED Provider Notes (Addendum)
By signing my name below, I, Bethel Born, attest that this documentation has been prepared under the direction and in the presence of Ellerie Arenz N Levita Monical, DO. Electronically Signed: Bethel Born, ED Scribe. 01/12/16. 11:23 PM.  TIME SEEN: 11:12 PM  CHIEF COMPLAINT: LE weakness  HPI: Briana Miller is a 60 y.o. female with PMHx of CHF, cardiomyopathy, HTN, HLD who presents to the Emergency Department complaining of bilateral lower extremity weakness with onset 1 week ago. She typically ambulates with a cane but has been using a walker over the last week per PT recommendations. Pt states that she is unable to stand without assistance because her legs are weak and they give out on her.She is in physical therapy due to a right knee replacement 4 years ago but has been unable to go recently because of the weakness.  When she stands her knees give out and she has had multiple falls recently. Pt notes increased right knee pain after the fall but denies head injury and LOC. Denies neck or back pain. Denies chest or abdominal pain. Zanaflex has provided insufficient pain relief at home.  Associated symptoms include generalized weakness, Headache, and dizziness. Pt denies new numbness or tingling, SOB, cough, vomiting, and diarrhea. She is not on a blood thinner.  Her blood pressure typically runs high but was low (90s systolic) for EMS. Over the last 2 weeks she has not been taking her Lasix but took 20 mg today.    ROS: See HPI Constitutional: no fever  Eyes: no drainage  ENT: no runny nose   Cardiovascular:  no chest pain  Resp: no SOB  GI: no vomiting GU: no dysuria Integumentary: no rash  Allergy: no hives  Musculoskeletal: no leg swelling  Neurological: no slurred speech ROS otherwise negative  PAST MEDICAL HISTORY/PAST SURGICAL HISTORY:  Past Medical History:  Diagnosis Date  . Allergy   . Anemia   . Arthritis   . Cardiomyopathy ([facility])   . CHF (congestive heart failure) ([facility])   .  GERD (gastroesophageal reflux disease)   . Hyperlipidemia   . Hypertension   . Overactive bladder     MEDICATIONS:  Prior to Admission medications   Medication Sig Start Date End Date Taking? Authorizing Provider  albuterol (PROVENTIL HFA;VENTOLIN HFA) 108 (90 BASE) MCG/ACT inhaler Inhale 2 puffs into the lungs every 6 (six) hours as needed for wheezing or shortness of breath. 05/16/14  Yes Sandford Craze, NP  calcium-vitamin D (OSCAL WITH D) 500-200 MG-UNIT per tablet Take 1 tablet by mouth daily.   Yes Historical Provider, MD  furosemide (LASIX) 40 MG tablet Take 20 mg by mouth daily.   Yes Historical Provider, MD  losartan (COZAAR) 50 MG tablet Take 1 tablet (50 mg total) by mouth daily. 10/23/15  Yes Sandford Craze, NP  metoprolol succinate (TOPROL-XL) 50 MG 24 hr tablet TAKE 1 TABLET BY MOUTH EVERY DAY 11/01/15  Yes Lewayne Bunting, MD  tiZANidine (ZANAFLEX) 4 MG capsule Take 4 mg by mouth 3 (three) times daily. 01/11/16  Yes Historical Provider, MD    ALLERGIES:  No Known Allergies  SOCIAL HISTORY:  Social History  Substance Use Topics  . Smoking status: Never Smoker  . Smokeless tobacco: Never Used  . Alcohol use 1.2 oz/week    2 Glasses of wine per week     Comment: None at present. Usually has 2 glasses wine daily    FAMILY HISTORY: Family History  Problem Relation Age of Onset  . Cancer Mother  colon 60 and breast 54- deceased  . Colon cancer Mother   . Diabetes Father     living  . Asthma Father   . Hypertension Father   . Heart attack Father 74    medical management per pt  . Glaucoma Father     had eye transplant  . Kidney failure Father     acute renal failure- died 46  . Esophageal cancer Neg Hx   . Stomach cancer Neg Hx   . Rectal cancer Neg Hx     EXAM: BP 105/89   Pulse 60   Temp 98.1 F (36.7 C) (Oral)   Resp 23   LMP 06/17/2007   SpO2 99%  CONSTITUTIONAL: Alert and oriented and responds appropriately to questions. Well-appearing;  well-nourished; GCS 15 HEAD: Normocephalic; atraumatic EYES: Conjunctivae clear, PERRL, EOMI ENT: normal nose; no rhinorrhea; moist mucous membranes; pharynx without lesions noted; no dental injury; no septal hematoma NECK: Supple, no meningismus, no LAD; no midline spinal tenderness, step-off or deformity CARD: RRR; S1 and S2 appreciated; no murmurs, no clicks, no rubs, no gallops RESP: Normal chest excursion without splinting or tachypnea; breath sounds clear and equal bilaterally; no wheezes, no rhonchi, no rales; no hypoxia or respiratory distress CHEST:  chest wall stable, no crepitus or ecchymosis or deformity, nontender to palpation ABD/GI: Normal bowel sounds; non-distended; soft, non-tender, no rebound, no guarding PELVIS:  stable, nontender to palpation BACK:  The back appears normal and is non-tender to palpation, there is no CVA tenderness; no midline spinal tenderness, step-off or deformity EXT: TTP over bilateral knees diffusely without bony deformity or joint effusion, Normal ROM in all joints; otherwise non-tender to palpation; no edema; normal capillary refill; no cyanosis, no bony tenderness or bony deformity of patient's extremities, no joint effusion, no ecchymosis or lacerations; 2+ DP pulses bilaterally   SKIN: Normal color for age and race; warm NEURO: Moves all extremities equally, sensation to light touch intact diffusely, cranial nerves II through XII intact,, pt has some difficulty lifting her legs off the bed secondary to pain in her knees  PSYCH: The patient's mood and manner are appropriate. Grooming and personal hygiene are appropriate.  MEDICAL DECISION MAKING: Patient here with weakness in both of her legs. Denies any back pain or new neurologic deficits. She is able to lift her legs off the bed but states it hurts to do so because of pain in her knees. States the pain in her knees is what makes her feel weak. She has had a physical therapist at home. Has been  followed up with her orthopedic physician Dr. Sherlean Foot.  We'll check basic labs to ensure there is no organic cause for her generalized weakness. Doubt stroke given she has no focal neurologic deficit on exam. Doubt cauda equina, spinal stenosis, epidural abscess or hematoma, transverse myelitis or discitis. She is not having back pain. We'll x-ray both of her knees that she is very tender over both of them especially on the right side. Nothing to suggest septic arthritis, gout. Neurovascular intact distally.  ED PROGRESS: Patient's labs are unremarkable. Urine shows no sign of infection. X-ray of the left knee shows arthritic changes without acute abnormality. Right knee shows a fractured patella with displaced fragments. She has full extension still in this knee. We'll place her in a knee immobilizer. Patient states that she does not think that she will be able to ambulate with crutches at home and she feels her house is too small for a wheelchair.  At this time she states she does not feel she would be able to go home safely and is requesting other options. Discussed with her that we can consult case management, social work and physical therapy in the morning to help with disposition, possible placement in a rehabilitation facility but this time I do not feel she meets criteria for admission to the hospital. She is aware that she may be discharged home with a wheelchair and will need to follow-up with her orthopedic physician as outpatient. She is comfortable with this plan and her pain has been well controlled.   7:45 AM  D/w Dr. Madelon Lips on call orthopedics for Dr. Sherlean Foot.  He agrees with keeping patient nonweightbearing, placing in the immobilizer and having her follow up with Dr. Sherlean Foot. Patient has been updated with plan that we will attempt to get case management, social work and physical therapy to see her this morning but given it is a weekend this may be difficult and she may be discharged with a  prescription for a wheelchair and outpatient follow-up. I discussed with her return precautions.   I reviewed all nursing notes, vitals, pertinent old records, EKGs, labs, imaging (as available).    8:00 AM  Pt reports she has called her insurance company who states that she does qualify for skilled nursing facility and that they will pay for it. Physical therapy at bedside for evaluation and treatment.    8:20 AM  PT Darl Pikes has seen patient and agrees that she needs a skilled nursing facility. She will contact social work herself.    EKG Interpretation  Date/Time:  Saturday January 12 2016 23:52:43 EDT Ventricular Rate:  54 PR Interval:    QRS Duration: 125 QT Interval:  505 QTC Calculation: 479 R Axis:   44 Text Interpretation:  Sinus rhythm Non-specific intra-ventricular conduction delay No old tracing to compare Confirmed by Tallia Moehring,  DO, Kamee Bobst ([id number]) on 01/13/2016 1:31:34 AM         I personally performed the services described in this documentation, which was scribed in my presence. The recorded information has been reviewed and is accurate.     Layla Maw Nastasha Reising, DO 01/13/16 1610    Layla Maw Boston Catarino, DO 01/13/16 [id number]    Layla Maw Raffaele Derise, DO 01/13/16 5409

## 2016-01-13 LAB — CBC WITH DIFFERENTIAL/PLATELET
Basophils Absolute: 0 10*3/uL (ref 0.0–0.1)
Basophils Relative: 0 %
EOS ABS: 0.3 10*3/uL (ref 0.0–0.7)
EOS PCT: 5 %
HCT: 36.8 % (ref 36.0–46.0)
Hemoglobin: 12.3 g/dL (ref 12.0–15.0)
LYMPHS ABS: 1.6 10*3/uL (ref 0.7–4.0)
Lymphocytes Relative: 25 %
MCH: 28.7 pg (ref 26.0–34.0)
MCHC: 33.4 g/dL (ref 30.0–36.0)
MCV: 85.8 fL (ref 78.0–100.0)
Monocytes Absolute: 0.6 10*3/uL (ref 0.1–1.0)
Monocytes Relative: 10 %
Neutro Abs: 3.9 10*3/uL (ref 1.7–7.7)
Neutrophils Relative %: 60 %
PLATELETS: 260 10*3/uL (ref 150–400)
RBC: 4.29 MIL/uL (ref 3.87–5.11)
RDW: 13.3 % (ref 11.5–15.5)
WBC: 6.4 10*3/uL (ref 4.0–10.5)

## 2016-01-13 LAB — URINALYSIS, ROUTINE W REFLEX MICROSCOPIC
GLUCOSE, UA: NEGATIVE mg/dL
HGB URINE DIPSTICK: NEGATIVE
Ketones, ur: 15 mg/dL — AB
LEUKOCYTES UA: NEGATIVE
Nitrite: NEGATIVE
PH: 6 (ref 5.0–8.0)
Protein, ur: NEGATIVE mg/dL
SPECIFIC GRAVITY, URINE: 1.026 (ref 1.005–1.030)

## 2016-01-13 LAB — BASIC METABOLIC PANEL
ANION GAP: 7 (ref 5–15)
BUN: 18 mg/dL (ref 6–20)
CHLORIDE: 105 mmol/L (ref 101–111)
CO2: 27 mmol/L (ref 22–32)
Calcium: 9.7 mg/dL (ref 8.9–10.3)
Creatinine, Ser: 1.1 mg/dL — ABNORMAL HIGH (ref 0.44–1.00)
GFR calc Af Amer: >60 mL/min (ref >60)
GFR, EST NON AFRICAN AMERICAN: 54 mL/min — AB (ref >60)
GLUCOSE: 103 mg/dL — AB (ref 65–99)
POTASSIUM: 3.9 mmol/L (ref 3.5–5.1)
Sodium: 139 mmol/L (ref 135–145)

## 2016-01-13 LAB — I-STAT TROPONIN, ED: Troponin i, poc: 0 ng/mL (ref 0.00–0.08)

## 2016-01-13 MED ORDER — METOPROLOL SUCCINATE ER 25 MG PO TB24
50.0000 mg | ORAL_TABLET | Freq: Every day | ORAL | Status: DC
  Administered 2016-01-13 – 2016-01-16 (×4): 50 mg via ORAL
  Filled 2016-01-13 (×4): qty 2

## 2016-01-13 MED ORDER — FENTANYL CITRATE (PF) 100 MCG/2ML IJ SOLN
50.0000 ug | Freq: Once | INTRAMUSCULAR | Status: AC
  Administered 2016-01-13: 50 ug via INTRAVENOUS
  Filled 2016-01-13: qty 2

## 2016-01-13 MED ORDER — HYDROCODONE-ACETAMINOPHEN 5-325 MG PO TABS: 1.0000 | ORAL_TABLET | Freq: Four times a day (QID) | ORAL | 0 refills | Status: DC | PRN

## 2016-01-13 MED ORDER — HYDROCODONE-ACETAMINOPHEN 5-325 MG PO TABS
1.0000 | ORAL_TABLET | Freq: Four times a day (QID) | ORAL | Status: DC | PRN
  Administered 2016-01-13: 2 via ORAL
  Filled 2016-01-13: qty 2

## 2016-01-13 MED ORDER — TIZANIDINE HCL 4 MG PO TABS
4.0000 mg | ORAL_TABLET | Freq: Three times a day (TID) | ORAL | Status: DC
  Administered 2016-01-13 – 2016-01-16 (×8): 4 mg via ORAL
  Filled 2016-01-13 (×9): qty 1

## 2016-01-13 MED ORDER — HYDROCODONE-ACETAMINOPHEN 5-325 MG PO TABS
1.0000 | ORAL_TABLET | Freq: Once | ORAL | Status: AC
  Administered 2016-01-13: 1 via ORAL
  Filled 2016-01-13: qty 1

## 2016-01-13 MED ORDER — ALBUTEROL SULFATE HFA 108 (90 BASE) MCG/ACT IN AERS: 2.0000 | INHALATION_SPRAY | Freq: Four times a day (QID) | RESPIRATORY_TRACT | Status: DC | PRN

## 2016-01-13 MED ORDER — LOSARTAN POTASSIUM 50 MG PO TABS
50.0000 mg | ORAL_TABLET | Freq: Every day | ORAL | Status: DC
  Administered 2016-01-13 – 2016-01-16 (×4): 50 mg via ORAL
  Filled 2016-01-13 (×5): qty 1

## 2016-01-13 MED ORDER — FUROSEMIDE 20 MG PO TABS
20.0000 mg | ORAL_TABLET | Freq: Every day | ORAL | Status: DC
  Administered 2016-01-13 – 2016-01-16 (×4): 20 mg via ORAL
  Filled 2016-01-13 (×4): qty 1

## 2016-01-13 MED ORDER — DOCUSATE SODIUM 100 MG PO CAPS: 100.0000 mg | ORAL_CAPSULE | Freq: Two times a day (BID) | ORAL | 0 refills | Status: DC

## 2016-01-13 NOTE — Progress Notes (Signed)
Patient has declined home health care due to co-pay amount. CM Genie Crutchfield notified.

## 2016-01-13 NOTE — ED Notes (Signed)
PT at bedside to see patient

## 2016-01-13 NOTE — ED Notes (Signed)
MD at bedside. 

## 2016-01-13 NOTE — Progress Notes (Signed)
CSW spoke with pt concerning SNF placement and pt concerns. CSW will follow-up with pt in the morning to address placement.    Ernestina Columbia, LCSWA [facility] ED Clinical Social Worker [phone]

## 2016-01-13 NOTE — ED Notes (Signed)
This RN requesting Dr. Adela Lank order patient's home meds since patient will be holding in Pod C over night.

## 2016-01-13 NOTE — Care Management Note (Addendum)
Case Management Note  Patient Details  Name: Kristyne Kindschi MRN: [id number] Date of Birth: 1956/02/25  Subjective/Objective: 60 y.o.F seen in the ED after multiple falls (4) related to Patellar Tear she sustained earlier this year. She had been attending Outpatient PT under the direction of Dr Sherlean Foot, as well as, Aerobic PT without improvement, until most recent fall which brought her to the ED. Spouse drives Truck locally but is gone at night.Adult Daughter is available but has two small children.  PT has evaluated this pt and recommended SNF as she is 2+ Assist.  Pt is currently thinking about those members of friends, family and faith community who can assist her at home while she is waiting for insurance approval for SNF. CM has spoken to MD, and SW to update all on plan.                    Action/Plan: Reassess after noon. Pt has RW, BSC. Is NWB    Expected Discharge Date:                  Expected Discharge Plan:   (Pending Disposition)  In-House Referral:  Clinical Social Work  Discharge planning Services  CM Consult  Post Acute Care Choice:    Choice offered to:  Patient  DME Arranged:    DME Agency:     HH Arranged:    HH Agency:     Status of Service:  In process, will continue to follow  If discussed at Long Length of Stay Meetings, dates discussed:    Additional Comments:  Yvone Neu, RN 01/13/2016, 11:06 AM

## 2016-01-13 NOTE — ED Notes (Signed)
Pt arrived to C25 via stretcher. Staff x 3 assisted pt to hospital bed for comfort. Pt's belongings in bag at bedside. Pt has her cell phone and charger w/her. Pt's knee immobilizer noted to be on bedside table - states she removed d/t uncomfortable. Pt alert, oriented. States ate dinner.

## 2016-01-13 NOTE — ED Notes (Signed)
PT advised this RN she was recommending SNF placement for patient, PT advised she contacted social work who advised case management would see patient after 9 am when they come in.

## 2016-01-13 NOTE — ED Notes (Signed)
Case management at bedsie

## 2016-01-13 NOTE — ED Notes (Signed)
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

## 2016-01-13 NOTE — ED Notes (Signed)
Lunch tray order placed for patient.

## 2016-01-13 NOTE — Evaluation (Signed)
Physical Therapy Evaluation Patient Details Name: Briana Miller MRN: [id number] DOB: [birth year] Today's Date: 01/13/2016   History of Present Illness  Patient is a 60 yo female to ED on 01/12/16 with bilateral LE weakness.  Patient with multiple falls with RLE "giving out".  Patient with Rt patellar fx.  Patient is to be NWB on RLE and with knee immobilizer.   PMH:  CHF, cardiomyopathy, HTN, HLD, arthritis, Rt TKA  Clinical Impression  Patient presents with problems listed below.  Will benefit from acute PT to maximize functional mobility prior to discharge.  Patient requiring +2 max assist for mobility.  Recommend SNF at d/c for continued therapy with goal to achieve Mod I to return home.  Contacted CM Maralyn Sago) who will give information to SW when she arrives at 9:00.    Follow Up Recommendations SNF;Supervision/Assistance - 24 hour    Equipment Recommendations  None recommended by PT    Recommendations for Other Services       Precautions / Restrictions Precautions Precautions: Fall Required Braces or Orthoses: Knee Immobilizer - Right Knee Immobilizer - Right: On at all times Restrictions Weight Bearing Restrictions: Yes RLE Weight Bearing: Non weight bearing      Mobility  Bed Mobility Overal bed mobility: Needs Assistance;+2 for physical assistance Bed Mobility: Supine to Sit;Sit to Supine     Supine to sit: Max assist;+2 for physical assistance Sit to supine: Max assist;+2 for physical assistance   General bed mobility comments: Initiated movement to EOB.  Patient required max assist to move LE's and raise trunk to sitting.  Patient with 10/10 pain with mobiliity.  Returned to supine with max assist.  Transfers                 General transfer comment: Patient unable to attempt standing  Ambulation/Gait                Stairs            Wheelchair Mobility    Modified Rankin (Stroke Patients Only)       Balance                                              Pertinent Vitals/Pain Pain Assessment: 0-10 Pain Score: 10-Worst pain ever Pain Location: Bil knees with any movement Pain Descriptors / Indicators: Aching;Crying;Sharp;Shooting;Sore Pain Intervention(s): Limited activity within patient's tolerance;Repositioned    Home Living Family/patient expects to be discharged to:: Skilled nursing facility Living Arrangements: Spouse/significant other Available Help at Discharge: Family;Available PRN/intermittently (Husband drives trucks - pt alone for several days at a time) Type of Home: House Home Access: Stairs to enter Entrance Stairs-Rails: None Entrance Stairs-Number of Steps: 3 Home Layout: One level Home Equipment: Walker - 2 wheels;Cane - single point;Bedside commode      Prior Function Level of Independence: Independent with assistive device(s) (Prior to fall on 01/10/16)               Hand Dominance        Extremity/Trunk Assessment   Upper Extremity Assessment: Overall WFL for tasks assessed           Lower Extremity Assessment: RLE deficits/detail;LLE deficits/detail RLE Deficits / Details: Decreased strength and ROM due to pain in injury.  Unable to lift RLE against gravity LLE Deficits / Details: Decreased strength and ROM due to pain.  Strength  grossly 3-/5     Communication   Communication: No difficulties  Cognition Arousal/Alertness: Awake/alert Behavior During Therapy: WFL for tasks assessed/performed Overall Cognitive Status: Within Functional Limits for tasks assessed                      General Comments      Exercises        Assessment/Plan    PT Assessment Patient needs continued PT services  PT Diagnosis Difficulty walking;Generalized weakness;Acute pain   PT Problem List Decreased strength;Decreased range of motion;Decreased activity tolerance;Decreased balance;Decreased mobility;Decreased knowledge of use of DME;Decreased knowledge of  precautions;Obesity;Pain  PT Treatment Interventions DME instruction;Gait training;Functional mobility training;Therapeutic activities;Therapeutic exercise;Patient/family education   PT Goals (Current goals can be found in the Care Plan section) Acute Rehab PT Goals Patient Stated Goal: To go to skilled facility for rehab PT Goal Formulation: With patient Time For Goal Achievement: 01/27/16 Potential to Achieve Goals: Good    Frequency Min 3X/week   Barriers to discharge Inaccessible home environment;Decreased caregiver support Stairs to enter home.  Patient alone most days    Co-evaluation               End of Session Equipment Utilized During Treatment: Right knee immobilizer Activity Tolerance: Patient limited by pain Patient left: in bed;with call bell/phone within reach Nurse Communication: Mobility status (Need for SNF, contacted CM/SW)    Functional Assessment Tool Used: Clinical judgement Functional Limitation: Mobility: Walking and moving around Mobility: Walking and Moving Around Current Status (Y7829): At least 80 percent but less than 100 percent impaired, limited or restricted Mobility: Walking and Moving Around Goal Status [phone]): At least 20 percent but less than 40 percent impaired, limited or restricted    Time: 0758-0826 PT Time Calculation (min) (ACUTE ONLY): 28 min   Charges:   PT Evaluation $PT Eval Low Complexity: 1 Procedure PT Treatments $Therapeutic Activity: 8-22 mins $Neuromuscular Re-education: 8-22 mins   PT G Codes:   PT G-Codes **NOT FOR INPATIENT CLASS** Functional Assessment Tool Used: Clinical judgement Functional Limitation: Mobility: Walking and moving around Mobility: Walking and Moving Around Current Status (Y8657): At least 80 percent but less than 100 percent impaired, limited or restricted Mobility: Walking and Moving Around Goal Status [phone]): At least 20 percent but less than 40 percent impaired, limited or restricted     Vena Austria 01/13/2016, 8:49 AM Durenda Hurt. Renaldo Fiddler, [facility] Acute Rehab Services Pager [phone]

## 2016-01-13 NOTE — ED Notes (Signed)
Pt updated on plan to move to another room in ED while waiting on placement at facility.

## 2016-01-13 NOTE — ED Notes (Signed)
Pt requests medicine she "asked for 2 hours ago".

## 2016-01-14 ENCOUNTER — Ambulatory Visit: Payer: BLUE CROSS/BLUE SHIELD | Admitting: Physical Therapy

## 2016-01-14 NOTE — Progress Notes (Signed)
CSW contacted by Lowella Bandy- Admissions Coordinator at [facility] who reports that she was given approval for a 5 day LOG, however, this was later declined by her "higher management". She reports that she was informed that if Assistant CSW Director deemed Patient difficult to place, they would be able to take Patient. CSW contacted CSW Chiropodist who called Lowella Bandy for clarification. Research scientist (physical sciences) gave this CSW a call back and reports to inquire about 5 day LOG only to [facility], Private Pay for a facility, or return home with help from natural supports as these are Patient's only options. CSW will talk to Patient regarding options.   RN has canceled PTAR.     Lance Muss, LCSW [facility] ED/82M Clinical Social Worker [phone]

## 2016-01-14 NOTE — Clinical Social Work Placement (Signed)
   CLINICAL SOCIAL WORK PLACEMENT  NOTE  Date:  01/14/2016  Patient Details  Name: Briana Miller MRN: [id number] Date of Birth: 1956/01/25  Clinical Social Work is seeking post-discharge placement for this patient at the Skilled  Nursing Facility level of care (*CSW will initial, date and re-position this form in  chart as items are completed):  Yes   Patient/family provided with [facility] Clinical Social Work Department's list of facilities offering this level of care within the geographic area requested by the patient (or if unable, by the patient's family).  Yes   Patient/family informed of their freedom to choose among providers that offer the needed level of care, that participate in Medicare, Medicaid or managed care program needed by the patient, have an available bed and are willing to accept the patient.  Yes   Patient/family informed of [facility]'s ownership interest in [facility] and [facility], as well as of the fact that they are under no obligation to receive care at these facilities.  PASRR submitted to EDS on 01/14/16     PASRR number received on 01/14/16     Existing PASRR number confirmed on       FL2 transmitted to all facilities in geographic area requested by pt/family on 01/14/16     FL2 transmitted to all facilities within larger geographic area on 01/14/16     Patient informed that his/her managed care company has contracts with or will negotiate with certain facilities, including the following:        Yes   Patient/family informed of bed offers received.  Patient chooses bed at [facility]     Physician recommends and patient chooses bed at      Patient to be transferred to [facility] on 01/14/16.  Patient to be transferred to facility by PTAR     Patient family notified on 01/14/16 of transfer.  Name of family member notified:  Patient notifed     PHYSICIAN Please prepare prescriptions, Please sign  FL2     Additional Comment:    _______________________________________________ [name], LCSW 01/14/2016, 12:08 PM

## 2016-01-14 NOTE — Progress Notes (Signed)
Pt awaits tx to [facility] pending insurance authorization.  The facility's liaison met with pt earlier this pm to discuss her pending transfer, and all medical information has been provided to the facility.  CSW will continue to follow for disposition.

## 2016-01-14 NOTE — Progress Notes (Signed)
Patient FL-2 completed and faxed out to local skilled nursing facilities. Awaiting bed offers. CSW will continue to follow for disposition.          Lance Muss, LCSW [facility] ED/68M Clinical Social Worker [phone]

## 2016-01-14 NOTE — NC FL2 (Signed)
[place] MEDICAID FL2 LEVEL OF CARE SCREENING TOOL     IDENTIFICATION  Patient Name: Briana Miller Birthdate: 05-Mar-1956 Sex: female Admission Date (Current Location): 01/12/2016  [facility] and [place] Number:  Producer, television/film/video and Address:  The [place]. [facility], [address] [place], [place] [address]      Provider Number: [id number]  Attending Physician Name and Address:  Provider Default, MD  Relative Name and Phone Number:  Yeny Schmoll (Spouse) [phone]    Current Level of Care: Hospital Recommended Level of Care: Skilled Nursing Facility Prior Approval Number:    Date Approved/Denied:   PASRR Number: [id number]  Discharge Plan: SNF    Current Diagnoses: Patient Active Problem List   Diagnosis Date Noted  . Preop cardiovascular exam 09/12/2015  . Venous stasis ulcers (HCC) 06/23/2015  . Overactive bladder 06/23/2015  . GERD (gastroesophageal reflux disease) 06/23/2015  . CHF (congestive heart failure) (HCC) 05/15/2015  . Preventative health care 03/23/2015  . Migraines 03/23/2015  . Borderline diabetes 05/18/2014  . Acute systolic congestive heart failure (HCC) 03/01/2014  . HTN (hypertension) 06/02/2013  . Allergic rhinitis 06/01/2013  . Congestive dilated cardiomyopathy (HCC) 06/01/2013  . OSA (obstructive sleep apnea) 04/08/2013  . Family history of colon cancer 04/08/2013  . Severe obesity (BMI >= 40) (HCC) 04/08/2013    Orientation RESPIRATION BLADDER Height & Weight     Self, Time, Place, Situation  Normal Continent Weight:   Height:     BEHAVIORAL SYMPTOMS/MOOD NEUROLOGICAL BOWEL NUTRITION STATUS   (NONE)  (NONE) Continent Diet (Heart Healthy Diet)  AMBULATORY STATUS COMMUNICATION OF NEEDS Skin   Extensive Assist Verbally Normal                       Personal Care Assistance Level of Assistance  Bathing, Feeding, Dressing Bathing Assistance: Limited assistance Feeding assistance: Independent Dressing  Assistance: Limited assistance     Functional Limitations Info  Sight, Hearing, Speech Sight Info: Adequate Hearing Info: Adequate Speech Info: Adequate    SPECIAL CARE FACTORS FREQUENCY  PT (By licensed PT)     PT Frequency: min 3x/week               Contractures Contractures Info: Not present    Additional Factors Info  Code Status Code Status Info: Not on File Allergies Info: No Known Allergies           Current Medications (01/14/2016):  This is the current hospital active medication list Current Facility-Administered Medications  Medication Dose Route Frequency Provider Last Rate Last Dose  . albuterol (PROVENTIL HFA;VENTOLIN HFA) 108 (90 Base) MCG/ACT inhaler 2 puff  2 puff Inhalation Q6H PRN Melene Plan, DO      . furosemide (LASIX) tablet 20 mg  20 mg Oral Daily Melene Plan, DO   20 mg at 01/13/16 2045  . HYDROcodone-acetaminophen (NORCO/VICODIN) 5-325 MG per tablet 1-2 tablet  1-2 tablet Oral Q6H PRN Leta Baptist, MD   2 tablet at 01/13/16 1528  . losartan (COZAAR) tablet 50 mg  50 mg Oral Daily Melene Plan, DO   50 mg at 01/13/16 2045  . metoprolol succinate (TOPROL-XL) 24 hr tablet 50 mg  50 mg Oral Daily Melene Plan, DO   50 mg at 01/13/16 2045  . tiZANidine (ZANAFLEX) tablet 4 mg  4 mg Oral TID Melene Plan, DO   4 mg at 01/13/16 2046   Current Outpatient Prescriptions  Medication Sig Dispense Refill  . albuterol (PROVENTIL  HFA;VENTOLIN HFA) 108 (90 BASE) MCG/ACT inhaler Inhale 2 puffs into the lungs every 6 (six) hours as needed for wheezing or shortness of breath. 1 Inhaler 0  . calcium-vitamin D (OSCAL WITH D) 500-200 MG-UNIT per tablet Take 1 tablet by mouth daily.    . furosemide (LASIX) 40 MG tablet Take 20 mg by mouth daily.    Marland Kitchen losartan (COZAAR) 50 MG tablet Take 1 tablet (50 mg total) by mouth daily. 30 tablet 3  . metoprolol succinate (TOPROL-XL) 50 MG 24 hr tablet TAKE 1 TABLET BY MOUTH EVERY DAY 90 tablet 1  . tiZANidine (ZANAFLEX) 4 MG capsule Take 4  mg by mouth 3 (three) times daily.    Marland Kitchen docusate sodium (COLACE) 100 MG capsule Take 1 capsule (100 mg total) by mouth every 12 (twelve) hours. 60 capsule 0  . HYDROcodone-acetaminophen (NORCO/VICODIN) 5-325 MG tablet Take 1-2 tablets by mouth every 6 (six) hours as needed. 30 tablet 0     Discharge Medications: Please see discharge summary for a list of discharge medications.  Relevant Imaging Results:  Relevant Lab Results:   Additional Information SS # [id number]  [name], LCSW

## 2016-01-14 NOTE — Clinical Social Work Placement (Signed)
   CLINICAL SOCIAL WORK PLACEMENT  NOTE  Date:  01/14/2016  Patient Details  Name: Briana Miller MRN: [id number] Date of Birth: 1955/07/20  Clinical Social Work is seeking post-discharge placement for this patient at the Skilled  Nursing Facility level of care (*CSW will initial, date and re-position this form in  chart as items are completed):  Yes   Patient/family provided with [facility] Clinical Social Work Department's list of facilities offering this level of care within the geographic area requested by the patient (or if unable, by the patient's family).  Yes   Patient/family informed of their freedom to choose among providers that offer the needed level of care, that participate in Medicare, Medicaid or managed care program needed by the patient, have an available bed and are willing to accept the patient.  Yes   Patient/family informed of [facility]'s ownership interest in [facility] and [facility], as well as of the fact that they are under no obligation to receive care at these facilities.  PASRR submitted to EDS on 01/14/16     PASRR number received on 01/14/16     Existing PASRR number confirmed on       FL2 transmitted to all facilities in geographic area requested by pt/family on 01/14/16     FL2 transmitted to all facilities within larger geographic area on 01/14/16     Patient informed that his/her managed care company has contracts with or will negotiate with certain facilities, including the following:            Patient/family informed of bed offers received.  Patient chooses bed at       Physician recommends and patient chooses bed at      Patient to be transferred to   on  .  Patient to be transferred to facility by       Patient family notified on   of transfer.  Name of family member notified:        PHYSICIAN Please prepare prescriptions, Please sign FL2     Additional Comment:     _______________________________________________ [name], LCSW 01/14/2016, 8:15 AM

## 2016-01-14 NOTE — Progress Notes (Signed)
Patient has been accepted to [facility] with a 5 day LOG pending BCBS authorization. CSW awaiting return phone call from [facility] coordinator re: time of transport and any additional information needed. CSW will continue to follow.          Lance Muss, LCSW [facility] ED/36M Clinical Social Worker [phone]

## 2016-01-14 NOTE — NC FL2 (Signed)
[place] MEDICAID FL2 LEVEL OF CARE SCREENING TOOL     IDENTIFICATION  Patient Name: Briana Miller Birthdate: [birth year] Sex: female Admission Date (Current Location): 01/12/2016  [facility] and [place] Number:  Producer, television/film/video and Address:  The [place]. [facility], [address] [place], [place] [address]      Provider Number: [id number]  Attending Physician Name and Address:  Provider Default, MD  Relative Name and Phone Number:  Lachanda Buczek (Spouse) [phone]    Current Level of Care: Hospital Recommended Level of Care: Skilled Nursing Facility Prior Approval Number:    Date Approved/Denied:   PASRR Number: [id number]  Discharge Plan: SNF    Current Diagnoses: Patient Active Problem List   Diagnosis Date Noted  . Preop cardiovascular exam 09/12/2015  . Venous stasis ulcers (HCC) 06/23/2015  . Overactive bladder 06/23/2015  . GERD (gastroesophageal reflux disease) 06/23/2015  . CHF (congestive heart failure) (HCC) 05/15/2015  . Preventative health care 03/23/2015  . Migraines 03/23/2015  . Borderline diabetes 05/18/2014  . Acute systolic congestive heart failure (HCC) 03/01/2014  . HTN (hypertension) 06/02/2013  . Allergic rhinitis 06/01/2013  . Congestive dilated cardiomyopathy (HCC) 06/01/2013  . OSA (obstructive sleep apnea) 04/08/2013  . Family history of colon cancer 04/08/2013  . Severe obesity (BMI >= 40) (HCC) 04/08/2013    Orientation RESPIRATION BLADDER Height & Weight     Self, Time, Place, Situation  Normal Continent Weight:   Height:     BEHAVIORAL SYMPTOMS/MOOD NEUROLOGICAL BOWEL NUTRITION STATUS   (NONE)  (NONE) Continent Diet (Heart Healthy Diet)  AMBULATORY STATUS COMMUNICATION OF NEEDS Skin   Extensive Assist Verbally Normal                       Personal Care Assistance Level of Assistance  Bathing, Feeding, Dressing Bathing Assistance: Limited assistance Feeding assistance: Independent Dressing  Assistance: Limited assistance     Functional Limitations Info  Sight, Hearing, Speech Sight Info: Adequate Hearing Info: Adequate Speech Info: Adequate    SPECIAL CARE FACTORS FREQUENCY  PT (By licensed PT)     PT Frequency: min 3x/week               Contractures Contractures Info: Not present    Additional Factors Info  Code Status Code Status Info: Not on File Allergies Info: No Known Allergies           Current Medications (01/14/2016):  This is the current hospital active medication list Current Facility-Administered Medications  Medication Dose Route Frequency Provider Last Rate Last Dose  . albuterol (PROVENTIL HFA;VENTOLIN HFA) 108 (90 Base) MCG/ACT inhaler 2 puff  2 puff Inhalation Q6H PRN Melene Plan, DO      . furosemide (LASIX) tablet 20 mg  20 mg Oral Daily Melene Plan, DO   20 mg at 01/14/16 0939  . HYDROcodone-acetaminophen (NORCO/VICODIN) 5-325 MG per tablet 1-2 tablet  1-2 tablet Oral Q6H PRN Leta Baptist, MD   2 tablet at 01/13/16 1528  . losartan (COZAAR) tablet 50 mg  50 mg Oral Daily Melene Plan, DO   50 mg at 01/14/16 8469  . metoprolol succinate (TOPROL-XL) 24 hr tablet 50 mg  50 mg Oral Daily Melene Plan, DO   50 mg at 01/14/16 0939  . tiZANidine (ZANAFLEX) tablet 4 mg  4 mg Oral TID Melene Plan, DO   4 mg at 01/14/16 6295   Current Outpatient Prescriptions  Medication Sig Dispense Refill  . albuterol (PROVENTIL  HFA;VENTOLIN HFA) 108 (90 BASE) MCG/ACT inhaler Inhale 2 puffs into the lungs every 6 (six) hours as needed for wheezing or shortness of breath. 1 Inhaler 0  . calcium-vitamin D (OSCAL WITH D) 500-200 MG-UNIT per tablet Take 1 tablet by mouth daily.    . furosemide (LASIX) 40 MG tablet Take 20 mg by mouth daily.    Marland Kitchen losartan (COZAAR) 50 MG tablet Take 1 tablet (50 mg total) by mouth daily. 30 tablet 3  . metoprolol succinate (TOPROL-XL) 50 MG 24 hr tablet TAKE 1 TABLET BY MOUTH EVERY DAY 90 tablet 1  . tiZANidine (ZANAFLEX) 4 MG capsule Take 4  mg by mouth 3 (three) times daily.    Marland Kitchen docusate sodium (COLACE) 100 MG capsule Take 1 capsule (100 mg total) by mouth every 12 (twelve) hours. 60 capsule 0  . HYDROcodone-acetaminophen (NORCO/VICODIN) 5-325 MG tablet Take 1-2 tablets by mouth every 6 (six) hours as needed. 30 tablet 0     Discharge Medications: Please see discharge summary for a list of discharge medications.  Relevant Imaging Results:  Relevant Lab Results:   Additional Information SS # [id number]  Jermiah Soderman, Randol Kern, LCSW

## 2016-01-14 NOTE — Progress Notes (Signed)
Patient will DC to: [facility] Anticipated DC date: 01/14/2016 Family notified: Patient  Transport by: Sharin Mons  Nurse to Nurse Report: [phone]  CSW signing off. Please contact if new need(s) arise.   Noe Gens, MSW, LCSW [facility] ED/59M Clinical Social Worker [phone]

## 2016-01-15 MED ORDER — DOCUSATE SODIUM 100 MG PO CAPS
100.0000 mg | ORAL_CAPSULE | Freq: Every day | ORAL | Status: DC
  Administered 2016-01-15: 100 mg via ORAL
  Filled 2016-01-15 (×2): qty 1

## 2016-01-15 NOTE — ED Notes (Signed)
States she spoke w/nursing facility and was advised to call back after 1100.

## 2016-01-15 NOTE — ED Notes (Signed)
Pt aware she will be moving to B17.

## 2016-01-15 NOTE — ED Notes (Signed)
Cups of ice given to pt's family members.

## 2016-01-15 NOTE — ED Notes (Signed)
Placed pt on bedpan as requested. Pt to press call bell when finished.

## 2016-01-15 NOTE — ED Notes (Signed)
Spouse visiting w/pt.  

## 2016-01-15 NOTE — ED Notes (Signed)
Pt transported to B17 via bed. Belongings bag and purse placed on counter, knee immobilizer placed on bedside chair per pt's request. Pt has her cell phone in bed w/her charging.

## 2016-01-15 NOTE — ED Notes (Signed)
Report given to C Winnifred Friar, RN.

## 2016-01-15 NOTE — ED Notes (Addendum)
Regular diet ordered for patient for lunch, and patient was given a sprite and pack of cookies.

## 2016-01-15 NOTE — Progress Notes (Signed)
CSW engaged with [place] in Admissions at [facility]. Per Iona Hansen is still pending, she will give CSW a call once Berkley Harvey has gone through. CSW continues to follow for disposition.          Lance Muss, LCSW [facility] ED/68M Clinical Social Worker [phone]

## 2016-01-16 ENCOUNTER — Ambulatory Visit: Payer: BLUE CROSS/BLUE SHIELD

## 2016-01-16 NOTE — Progress Notes (Signed)
[facility] has been contacted for transport.          Lance Muss, LCSW [facility] ED/18M Clinical Social Worker [phone]

## 2016-01-16 NOTE — ED Notes (Signed)
Ordered breakfast tray for pt  

## 2016-01-16 NOTE — Progress Notes (Addendum)
Patient will DC to: [facility]  Anticipated DC date: 01/16/2016 Family notified: Patient notified Transport by: Sharin Mons  Nurse to Nurse Report: [phone] Please have MD complete any hard prescriptions needed  CSW signing off. Please contact if new need(s) arise.   Noe Gens, MSW, LCSW [facility] ED/71M Clinical Social Worker [phone]

## 2016-01-17 ENCOUNTER — Encounter: Payer: Self-pay | Admitting: Internal Medicine

## 2016-01-17 ENCOUNTER — Non-Acute Institutional Stay (SKILLED_NURSING_FACILITY): Payer: BLUE CROSS/BLUE SHIELD | Admitting: Internal Medicine

## 2016-01-17 DIAGNOSIS — S82001D Unspecified fracture of right patella, subsequent encounter for closed fracture with routine healing: Secondary | ICD-10-CM

## 2016-01-17 DIAGNOSIS — I420 Dilated cardiomyopathy: Secondary | ICD-10-CM | POA: Diagnosis not present

## 2016-01-17 DIAGNOSIS — S81802A Unspecified open wound, left lower leg, initial encounter: Secondary | ICD-10-CM

## 2016-01-17 DIAGNOSIS — I110 Hypertensive heart disease with heart failure: Secondary | ICD-10-CM

## 2016-01-17 DIAGNOSIS — S81801A Unspecified open wound, right lower leg, initial encounter: Secondary | ICD-10-CM

## 2016-01-17 DIAGNOSIS — M1990 Unspecified osteoarthritis, unspecified site: Secondary | ICD-10-CM

## 2016-01-17 NOTE — Progress Notes (Signed)
MRN: [id number] Name: Briana Miller  Sex: female Age: 60 y.o. DOB: [birth year]  PSC #:  Facility/Room: [facility] / 125 A Level Of Care: SNF Provider: Randon Goldsmith. Lyn Hollingshead, MD Emergency Contacts: Extended Emergency Contact Information Primary Emergency Contact: [facility] Address: [address]          [place], [place] [address] Darden [place] of [place] Home Phone: [phone] Mobile Phone: [phone] Relation: Spouse  Code Status: Full Code  Allergies: Review of patient's allergies indicates no known allergies.  Chief Complaint  Patient presents with  . New Admit To SNF    Admit to Facility    HPI: Patient is 60 y.o. female with CHF, cardiomyopathy, HTN, HLD who presents to the Emergency Department complaining of bilateral lower extremity weakness with onset 1 week ago, resulting in falls at home..Pt was not considered appropriate for admission but stayed in the ED from 7/29-8/2 where she was dx with a R patella fx with displaced fragments and placed in knee immobilizer and non weight bearing. Pt did not feel like she could go home in  [facility], her house was too small so she finally was able to be placed in SNF for rehab. While at SNF pt will be followed for CHF, tx with lasix and metoprolol, HTN, tx with metoprolol, lasix and cozaar and arthritis , tx with Oscal+ D.  Past Medical History:  Diagnosis Date  . Allergy   . Anemia   . Arthritis   . Cardiomyopathy (HCC)   . CHF (congestive heart failure) (HCC)   . GERD (gastroesophageal reflux disease)   . Hyperlipidemia   . Hypertension   . Overactive bladder     Past Surgical History:  Procedure Laterality Date  . COLONOSCOPY    . KNEE ARTHROSCOPY     2007  . ROTATOR CUFF REPAIR Right 03/23/13  . TOTAL KNEE ARTHROPLASTY  09/22/2011   Procedure: TOTAL KNEE ARTHROPLASTY;  Surgeon: Raymon Mutton, MD;  Location: [facility] OR;  Service: Orthopedics;  Laterality: Right;      Medication List       Accurate as of 01/17/16 11:59 PM.  Always use your most recent med list.          albuterol 108 (90 Base) MCG/ACT inhaler Commonly known as:  PROVENTIL HFA;VENTOLIN HFA Inhale 2 puffs into the lungs every 6 (six) hours as needed for wheezing or shortness of breath.   calcium-vitamin D 500-200 MG-UNIT tablet Commonly known as:  OSCAL WITH D Take 1 tablet by mouth daily.   docusate sodium 100 MG capsule Commonly known as:  COLACE Take 1 capsule (100 mg total) by mouth every 12 (twelve) hours.   furosemide 40 MG tablet Commonly known as:  LASIX Take 20 mg by mouth daily.   HYDROcodone-acetaminophen 5-325 MG tablet Commonly known as:  NORCO/VICODIN Take 1-2 tablets by mouth every 6 (six) hours as needed.   losartan 50 MG tablet Commonly known as:  COZAAR Take 1 tablet (50 mg total) by mouth daily.   metoprolol succinate 50 MG 24 hr tablet Commonly known as:  TOPROL-XL TAKE 1 TABLET BY MOUTH EVERY DAY   tiZANidine 4 MG capsule Commonly known as:  ZANAFLEX Take 4 mg by mouth 3 (three) times daily.       No orders of the defined types were placed in this encounter.   Immunization History  Administered Date(s) Administered  . Influenza,inj,Quad PF,36+ Mos 04/08/2013  . Tdap 03/23/2015    Social History  Substance Use Topics  .  Smoking status: Never Smoker  . Smokeless tobacco: Never Used  . Alcohol use 1.2 oz/week    2 Glasses of wine per week     Comment: None at present. Usually has 2 glasses wine daily    Family history is   Family History  Problem Relation Age of Onset  . Cancer Mother     colon 87 and breast 36- deceased  . Colon cancer Mother   . Diabetes Father     living  . Asthma Father   . Hypertension Father   . Heart attack Father 1    medical management per pt  . Glaucoma Father     had eye transplant  . Kidney failure Father     acute renal failure- died 24  . Esophageal cancer Neg Hx   . Stomach cancer Neg Hx   . Rectal cancer Neg Hx       Review of  Systems  DATA OBTAINED: from patient, nurse GENERAL:  no fevers, fatigue, appetite changes SKIN: No itching, rash or wounds EYES: No eye pain, redness, discharge EARS: No earache, tinnitus, change in hearing NOSE: No congestion, drainage or bleeding  MOUTH/THROAT: No mouth or tooth pain, No sore throat RESPIRATORY: No cough, wheezing, SOB CARDIAC: No chest pain, palpitations, lower extremity edema  GI: No abdominal pain, No N/V/D or constipation, No heartburn or reflux  GU: No dysuria, frequency or urgency, or incontinence  MUSCULOSKELETAL: No unrelieved bone/joint pain NEUROLOGIC: No headache, dizziness or focal weakness PSYCHIATRIC: No c/o anxiety or sadness   Vitals:   01/17/16 1033  BP: 122/66  Pulse: 66  Resp: 18  Temp: 97.3 F (36.3 C)    SpO2 Readings from Last 1 Encounters:  01/17/16 98%        Physical Exam  GENERAL APPEARANCE: Alert, conversant,  No acute distress.  SKIN: 1 superficial but weeping wound on each LE, dressed HEAD: Normocephalic, atraumatic  EYES: Conjunctiva/lids clear. Pupils round, reactive. EOMs intact.  EARS: External exam WNL, canals clear. Hearing grossly normal.  NOSE: No deformity or discharge.  MOUTH/THROAT: Lips w/o lesions  RESPIRATORY: Breathing is even, unlabored. Lung sounds are clear   CARDIOVASCULAR: Heart RRR no murmurs, rubs or gallops. No peripheral edema.   GASTROINTESTINAL: Abdomen is soft, non-tender, not distended w/ normal bowel sounds. GENITOURINARY: Bladder non tender, not distended  MUSCULOSKELETAL: knee immobilizer R leg NEUROLOGIC:  Cranial nerves 2-12 grossly intact. Moves all extremities  PSYCHIATRIC: Mood and affect appropriate to situation, no behavioral issues  Patient Active Problem List   Diagnosis Date Noted  . Right patella fracture 01/20/2016  . Wound of right lower extremity 01/20/2016  . Wound of left lower extremity 01/20/2016  . Arthritis 01/20/2016  . Preop cardiovascular exam 09/12/2015  .  Venous stasis ulcers (HCC) 06/23/2015  . Overactive bladder 06/23/2015  . GERD (gastroesophageal reflux disease) 06/23/2015  . CHF (congestive heart failure) (HCC) 05/15/2015  . Preventative health care 03/23/2015  . Migraines 03/23/2015  . Borderline diabetes 05/18/2014  . Acute systolic congestive heart failure (HCC) 03/01/2014  . Hypertensive heart disease with CHF (congestive heart failure) (HCC) 06/02/2013  . Allergic rhinitis 06/01/2013  . Congestive dilated cardiomyopathy (HCC) 06/01/2013  . OSA (obstructive sleep apnea) 04/08/2013  . Family history of colon cancer 04/08/2013  . Severe obesity (BMI >= 40) (HCC) 04/08/2013       Component Value Date/Time   WBC 6.4 01/12/2016 2350   RBC 4.29 01/12/2016 2350   HGB 12.3 01/12/2016 2350  HCT 36.8 01/12/2016 2350   PLT 260 01/12/2016 2350   MCV 85.8 01/12/2016 2350   LYMPHSABS 1.6 01/12/2016 2350   MONOABS 0.6 01/12/2016 2350   EOSABS 0.3 01/12/2016 2350   BASOSABS 0.0 01/12/2016 2350        Component Value Date/Time   NA 139 01/12/2016 2350   K 3.9 01/12/2016 2350   CL 105 01/12/2016 2350   CO2 27 01/12/2016 2350   GLUCOSE 103 (H) 01/12/2016 2350   BUN 18 01/12/2016 2350   CREATININE 1.10 (H) 01/12/2016 2350   CREATININE 0.82 09/14/2015 1209   CALCIUM 9.7 01/12/2016 2350   PROT 7.7 09/21/2015 0957   ALBUMIN 4.2 09/21/2015 0957   AST 15 09/21/2015 0957   ALT 17 09/21/2015 0957   ALKPHOS 81 09/21/2015 0957   BILITOT 0.4 09/21/2015 0957   GFRNONAA 54 (L) 01/12/2016 2350   GFRAA >60 01/12/2016 2350    Lab Results  Component Value Date   HGBA1C 6.4 05/22/2014    Lab Results  Component Value Date   CHOL 175 03/23/2015   HDL 58.00 03/23/2015   LDLCALC 91 03/23/2015   TRIG 131.0 03/23/2015   CHOLHDL 3 03/23/2015     Dg Knee Complete 4 Views Left  Result Date: 01/12/2016 EXAM: LEFT KNEE - COMPLETE 4+ VIEW COMPARISON:  None. FINDINGS: No fracture dislocation. There are marked degenerative changes with  severe joint space narrowing of the femorotibial compartments, medial essentially bone on palm. There are marginal osteophytes from all 3 compartments. Bones are demineralized. No joint effusion.  Soft tissues are unremarkable. IMPRESSION: No fracture or acute finding.  Advanced osteoarthritis. Electronically Signed   By: Amie Portland M.D.   On: 01/12/2016 23:53  Dg Knee Complete 4 Views Right  Result Date: 01/12/2016 CLINICAL DATA:  Several falls over past month, fall x 3 today, bilateral knee pain- right knee is worse, hx right knee replacement x 4 yrs ago. EXAM: RIGHT KNEE - COMPLETE 4+ VIEW COMPARISON:  None. FINDINGS: There is an acute fracture of the patella. Fractures transverse across the mid patella. The superior inferior components are displaced by 15 mm. The there are a few small comminuted fracture components between the displaced major fracture fragments. No other fractures. The femoral and tibial prosthetic components are well-seated and aligned. Bones are demineralized. There is anterior soft tissue edema. IMPRESSION: 1. Fractured patella, with fracture components displaced 15 mm. Electronically Signed   By: Amie Portland M.D.   On: 01/12/2016 23:52   Not all labs, radiology exams or other studies done during hospitalization come through on my EPIC note; however they are reviewed by me.    Assessment and Plan  Right patella fracture Presumably occurred during one of pt's fall at her home; ortho in ED rec immobilizer SNf - pt admitted for OT/PT  Congestive dilated cardiomyopathy SNF - cont toprol XL 50 mg daily, cozaar 50 mg daily, lasix 20 mg daily  Hypertensive heart disease with CHF (congestive heart failure) (HCC) SNF - cont cozaar, 50 mg daily, lasix 20 mg daily and tiprol XL  Arthritis SNF - stable;cont oscal + D 500-200 daily    Time spent > 45 min;> 50% of time with patient was spent reviewing records, labs, tests and studies, counseling and developing plan of  care  Thurston Hole D. Lyn Hollingshead, MD

## 2016-01-18 ENCOUNTER — Ambulatory Visit: Payer: BLUE CROSS/BLUE SHIELD | Admitting: Physical Therapy

## 2016-01-20 ENCOUNTER — Encounter: Payer: Self-pay | Admitting: Internal Medicine

## 2016-01-20 DIAGNOSIS — M1990 Unspecified osteoarthritis, unspecified site: Secondary | ICD-10-CM | POA: Insufficient documentation

## 2016-01-20 DIAGNOSIS — S81801A Unspecified open wound, right lower leg, initial encounter: Secondary | ICD-10-CM | POA: Insufficient documentation

## 2016-01-20 DIAGNOSIS — S81802A Unspecified open wound, left lower leg, initial encounter: Secondary | ICD-10-CM | POA: Insufficient documentation

## 2016-01-20 DIAGNOSIS — S82001A Unspecified fracture of right patella, initial encounter for closed fracture: Secondary | ICD-10-CM | POA: Insufficient documentation

## 2016-01-20 NOTE — Assessment & Plan Note (Signed)
Presumably occurred during one of pt's fall at her home; ortho in ED rec immobilizer SNf - pt admitted for OT/PT

## 2016-01-20 NOTE — Assessment & Plan Note (Signed)
SNF - stable;cont oscal + D 500-200 daily

## 2016-01-20 NOTE — Assessment & Plan Note (Signed)
SNF - cont toprol XL 50 mg daily, cozaar 50 mg daily, lasix 20 mg daily

## 2016-01-20 NOTE — Assessment & Plan Note (Signed)
SNF - cont cozaar, 50 mg daily, lasix 20 mg daily and tiprol XL

## 2016-01-21 ENCOUNTER — Ambulatory Visit: Payer: BLUE CROSS/BLUE SHIELD

## 2016-01-23 ENCOUNTER — Ambulatory Visit: Payer: BLUE CROSS/BLUE SHIELD

## 2016-01-25 ENCOUNTER — Ambulatory Visit: Payer: BLUE CROSS/BLUE SHIELD | Admitting: Physical Therapy

## 2016-01-28 ENCOUNTER — Ambulatory Visit: Payer: BLUE CROSS/BLUE SHIELD | Admitting: Physical Therapy

## 2016-01-30 ENCOUNTER — Ambulatory Visit: Payer: BLUE CROSS/BLUE SHIELD

## 2016-02-01 ENCOUNTER — Ambulatory Visit: Payer: BLUE CROSS/BLUE SHIELD | Admitting: Physical Therapy

## 2016-02-08 ENCOUNTER — Non-Acute Institutional Stay (SKILLED_NURSING_FACILITY): Payer: BLUE CROSS/BLUE SHIELD | Admitting: Internal Medicine

## 2016-02-08 ENCOUNTER — Encounter: Payer: Self-pay | Admitting: Internal Medicine

## 2016-02-08 DIAGNOSIS — M79604 Pain in right leg: Secondary | ICD-10-CM | POA: Diagnosis not present

## 2016-02-08 DIAGNOSIS — S82001D Unspecified fracture of right patella, subsequent encounter for closed fracture with routine healing: Secondary | ICD-10-CM | POA: Diagnosis not present

## 2016-02-08 NOTE — Progress Notes (Signed)
Patient ID: Briana Miller, female   DOB: 29-Jun-1955, 60 y.o.   MRN: [id number]   Location:   [facility] Room Number: 125-A Place of Service:  SNF (31) Provider:  Edmon Crape, PA-C  Lemont Fillers., NP  Patient Care Team: Sandford Craze, NP as PCP - General (Internal Medicine)  Extended Emergency Contact Information Primary Emergency Contact: [facility] Address: [address]          [place], [place] [address] [place] of [place] Home Phone: [phone] Mobile Phone: [phone] Relation: Spouse  Code Status:  Full Code Goals of care: Advanced Directive information Advanced Directives 01/17/2016  Does patient have an advance directive? No  Type of Advance Directive -  Would patient like information on creating an advanced directive? -  Pre-existing out of facility DNR order (yellow form or pink MOST form) -     Chief Complaint  Patient presents with  . Acute Visit    Leg pain    HPI:  Pt is a 60 y.o. female seen today for an acute visit forRight leg discomfort. She does have a history of a right patella fracture and is here for rehabilitation.  She does have an order for Norco 5-325  milligrams 1 tab every 6 hours as needed for pain but she says this is not really helping  .        Past Medical History:  Diagnosis Date  . Allergy   . Anemia   . Arthritis   . Cardiomyopathy (HCC)   . CHF (congestive heart failure) (HCC)   . GERD (gastroesophageal reflux disease)   . Hyperlipidemia   . Hypertension   . Overactive bladder    Past Surgical History:  Procedure Laterality Date  . COLONOSCOPY    . KNEE ARTHROSCOPY     2007  . ROTATOR CUFF REPAIR Right 03/23/13  . TOTAL KNEE ARTHROPLASTY  09/22/2011   Procedure: TOTAL KNEE ARTHROPLASTY;  Surgeon: Raymon Mutton, MD;  Location: [facility] OR;  Service: Orthopedics;  Laterality: Right;    No Known Allergies    Medication List       Accurate as of 02/08/16  3:23 PM. Always use your  most recent med list.          albuterol 108 (90 Base) MCG/ACT inhaler Commonly known as:  PROVENTIL HFA;VENTOLIN HFA Inhale 2 puffs into the lungs every 6 (six) hours as needed for wheezing or shortness of breath.   calcium-vitamin D 500-200 MG-UNIT tablet Commonly known as:  OSCAL WITH D Take 1 tablet by mouth daily.   docusate sodium 100 MG capsule Commonly known as:  COLACE Take 1 capsule (100 mg total) by mouth every 12 (twelve) hours.   HYDROcodone-acetaminophen 5-325 MG tablet Commonly known as:  NORCO/VICODIN Take 1-2 tablets by mouth every 6 (six) hours as needed.   losartan 50 MG tablet Commonly known as:  COZAAR Take 1 tablet (50 mg total) by mouth daily.   Melatonin 3 MG Tabs Take 1 tablet by mouth at bedtime.   metoprolol succinate 50 MG 24 hr tablet Commonly known as:  TOPROL-XL TAKE 1 TABLET BY MOUTH EVERY DAY   tiZANidine 4 MG capsule Commonly known as:  ZANAFLEX Take 4 mg by mouth 3 (three) times daily.   torsemide 10 MG tablet Commonly known as:  DEMADEX Take 10 mg by mouth daily.       Review of Systems   DATA OBTAINED: from patient, nurse GENERAL:  no fevers, fatigue, appetite changes SKIN:  No itching, rash or wounds    RESPIRATORY: No cough, wheezing, SOB CARDIAC: No chest pain, palpitations,  Trace  lower extremity edema  GI: No abdominal pain, No N/V/D or constipation, No heartburn or reflux     MUSCULOSKELETAL: Is complaining of unrelieved right leg pain says the Norco was not effective NEUROLOGIC: No headache, dizziness or focal weakness PSYCHIATRIC: No c/o anxiety or sadness  Immunization History  Administered Date(s) Administered  . Influenza,inj,Quad PF,36+ Mos 04/08/2013  . Tdap 03/23/2015   Pertinent  Health Maintenance Due  Topic Date Due  . MAMMOGRAM  12/13/2015  . INFLUENZA VACCINE  01/15/2016  . PAP SMEAR  03/22/2018  . COLONOSCOPY  06/03/2018   Fall Risk  07/13/2015  Falls in the past year? No   Functional  Status Survey:    Vitals:   02/08/16 1428  BP: (!) 157/95  Pulse: 82  Resp: 18  Temp: 97.6 F (36.4 C)  TempSrc: Oral  SpO2: 98%  Weight: 266 lb (120.7 kg)  Height: 5\' 5"  (1.651 m)  Of note update a blood pressure is 116/64 Body mass index is 44.26 kg/m. Physical Exam  GENERAL APPEARANCE: Alert, conversant,  No acute distress.   Her skin is warm and dry  .  NOSE: No deformity or discharge.  MOUTH/THROAT: Lips w/o lesions  RESPIRATORY: Breathing is even, unlabored. Lung sounds are clear   CARDIOVASCULAR: Heart RRR no murmurs, rubs or gallops.trace peripheral edema.   GASTROINTESTINAL: Abdomen is soft, non-tender, not distended w/ normal bowel sounds.   MUSCULOSKELETAL: Is able to move all extremities 4 ambulating in a wheelchair complains of right leg discomfort with recent history of patella fracture-I do not note any overt erythema or edema but has some discomfort with gentle range of motion NEUROLOGIC:  Cranial nerves 2-12 grossly intact. Moves all extremities  PSYCHIATRIC: Mood and affect appropriate to situation, no behavioral issues Labs reviewed:  Recent Labs  09/14/15 1209 09/21/15 0957 01/12/16 2350  NA 142 141 139  K 4.1 3.9 3.9  CL 108 105 105  CO2 23 30 27   GLUCOSE 101* 90 103*  BUN 18 19 18   CREATININE 0.82 0.84 1.10*  CALCIUM 9.0 9.5 9.7    Recent Labs  03/23/15 1430 09/21/15 0957  AST 17 15  ALT 17 17  ALKPHOS 88 81  BILITOT 0.3 0.4  PROT 7.5 7.7  ALBUMIN 4.1 4.2    Recent Labs  03/23/15 1509 09/21/15 0957 01/12/16 2350  WBC 6.1 6.7 6.4  NEUTROABS 3.0 3.6 3.9  HGB 12.0 12.4 12.3  HCT 35.7* 36.5 36.8  MCV 82.6 83.7 85.8  PLT 290 262.0 260   Lab Results  Component Value Date   TSH 1.53 03/23/2015   Lab Results  Component Value Date   HGBA1C 6.4 05/22/2014   Lab Results  Component Value Date   CHOL 175 03/23/2015   HDL 58.00 03/23/2015   LDLCALC 91 03/23/2015   TRIG 131.0 03/23/2015   CHOLHDL 3 03/23/2015     Significant Diagnostic Results in last 30 days:  Dg Knee Complete 4 Views Left  Result Date: 01/12/2016 EXAM: LEFT KNEE - COMPLETE 4+ VIEW COMPARISON:  None. FINDINGS: No fracture dislocation. There are marked degenerative changes with severe joint space narrowing of the femorotibial compartments, medial essentially bone on palm. There are marginal osteophytes from all 3 compartments. Bones are demineralized. No joint effusion.  Soft tissues are unremarkable. IMPRESSION: No fracture or acute finding.  Advanced osteoarthritis. Electronically Signed   By: Onalee Huaavid  Ormond M.D.   On: 01/12/2016 23:53  Dg Knee Complete 4 Views Right  Result Date: 01/12/2016 CLINICAL DATA:  Several falls over past month, fall x 3 today, bilateral knee pain- right knee is worse, hx right knee replacement x 4 yrs ago. EXAM: RIGHT KNEE - COMPLETE 4+ VIEW COMPARISON:  None. FINDINGS: There is an acute fracture of the patella. Fractures transverse across the mid patella. The superior inferior components are displaced by 15 mm. The there are a few small comminuted fracture components between the displaced major fracture fragments. No other fractures. The femoral and tibial prosthetic components are well-seated and aligned. Bones are demineralized. There is anterior soft tissue edema. IMPRESSION: 1. Fractured patella, with fracture components displaced 15 mm. Electronically Signed   By: Amie Portland M.D.   On: 01/12/2016 23:52   Assessment/Plan 1 history of right leg pain with history of patella fracture- appears to be doing relatively well here but is complaining of pain she says she has had oxycodone before-and tolerated this well-we'll discontinue the Norco and start oxycodone 5 mg 1 tablet every 6 hours when necessary pain-monitor if this is more effective  [id number].

## 2016-02-13 LAB — CBC AND DIFFERENTIAL
HCT: 38 % (ref 36–46)
HEMOGLOBIN: 12.3 g/dL (ref 12.0–16.0)
Platelets: 211 10*3/uL (ref 150–399)
WBC: 4.4 10*3/mL

## 2016-02-13 LAB — BASIC METABOLIC PANEL
BUN: 32 mg/dL — AB (ref 4–21)
CREATININE: 1.2 mg/dL — AB (ref 0.5–1.1)
GLUCOSE: 121 mg/dL
POTASSIUM: 4.1 mmol/L (ref 3.4–5.3)
SODIUM: 141 mmol/L (ref 137–147)

## 2016-02-15 ENCOUNTER — Encounter: Payer: Self-pay | Admitting: Internal Medicine

## 2016-02-15 ENCOUNTER — Non-Acute Institutional Stay (SKILLED_NURSING_FACILITY): Payer: BLUE CROSS/BLUE SHIELD | Admitting: Internal Medicine

## 2016-02-15 DIAGNOSIS — R739 Hyperglycemia, unspecified: Secondary | ICD-10-CM | POA: Diagnosis not present

## 2016-02-15 DIAGNOSIS — R52 Pain, unspecified: Secondary | ICD-10-CM

## 2016-02-15 DIAGNOSIS — Z5189 Encounter for other specified aftercare: Secondary | ICD-10-CM

## 2016-02-15 DIAGNOSIS — I5032 Chronic diastolic (congestive) heart failure: Secondary | ICD-10-CM | POA: Diagnosis not present

## 2016-02-15 DIAGNOSIS — R609 Edema, unspecified: Secondary | ICD-10-CM

## 2016-02-15 NOTE — Progress Notes (Signed)
Location:    [facility] Room Number: 125/A Place of Service:  SNF (31) Provider:  Darcel Bayley., NP  Patient Care Team: Sandford Craze, NP as PCP - General (Internal Medicine)  Extended Emergency Contact Information Primary Emergency Contact: [facility] Address: [address]          [place], [place] [address] [place] Home Phone: [phone] Mobile Phone: [phone] Relation: Spouse  Code Status:  MOST Goals of care: Advanced Directive information Advanced Directives 02/15/2016  Does patient have an advance directive? Yes  Type of Advance Directive Out of facility DNR (pink MOST or yellow form)  Does patient want to make changes to advanced directive? No - Patient declined  Copy of advanced directive(s) in chart? Yes  Would patient like information on creating an advanced directive? -  Pre-existing out of facility DNR order (yellow form or pink MOST form) -     Chief Complaint  Patient presents with  . Acute Visit    edema,pain    HPI:  Pt is a 60 y.o. female seen today for an acute visit for  Complaints of increased lower extremity edema.  She is on Demadex 10 mg a day with a  history of systolic and congestive heart failure-- also is on a beta blocker--I see an ejection fraction on an echocardiogram done on 01/04/2016 showed an ejection fraction of 45-50 percent with grade 2 diastolic dysfunction.  I have reviewed her weights and these actually appear to be relatively stable weight today is 268 is comparable with the weight earlier this week- the weight is actually down about 3 pounds since her weight on August 2-nonetheless or edema does appear to be increased especially on the left leg-she also complains of some left leg discomfort she does have a history of chronic pain with a patellar fracture on the right I did see her last week for pain management and she was switched from Norco to oxycodone every 4  hours when necessary apparently she is using this quite often.  Vital signs appear to be stable she does not complain of any chest pain or increased shortness of breath  Past Medical History:  Diagnosis Date  . Allergy   . Anemia   . Arthritis   . Cardiomyopathy (HCC)   . CHF (congestive heart failure) (HCC)   . GERD (gastroesophageal reflux disease)   . Hyperlipidemia   . Hypertension   . Overactive bladder    Past Surgical History:  Procedure Laterality Date  . COLONOSCOPY    . KNEE ARTHROSCOPY     2007  . ROTATOR CUFF REPAIR Right 03/23/13  . TOTAL KNEE ARTHROPLASTY  09/22/2011   Procedure: TOTAL KNEE ARTHROPLASTY;  Surgeon: Raymon Mutton, MD;  Location: [facility] OR;  Service: Orthopedics;  Laterality: Right;    No Known Allergies    Medication List       Accurate as of 02/15/16 10:33 AM. Always use your most recent med list.          albuterol 108 (90 Base) MCG/ACT inhaler Commonly known as:  PROVENTIL HFA;VENTOLIN HFA Inhale 2 puffs into the lungs every 6 (six) hours as needed for wheezing or shortness of breath.   calcium-vitamin D 500-200 MG-UNIT tablet Commonly known as:  OSCAL WITH D Take 1 tablet by mouth daily.   docusate sodium 100 MG capsule Commonly known as:  COLACE Take 1 capsule (100 mg total) by mouth every 12 (twelve) hours.  losartan 50 MG tablet Commonly known as:  COZAAR Take 1 tablet (50 mg total) by mouth daily.   Melatonin 3 MG Tabs Take 1 tablet by mouth at bedtime.   metoprolol succinate 50 MG 24 hr tablet Commonly known as:  TOPROL-XL TAKE 1 TABLET BY MOUTH EVERY DAY   oxyCODONE 5 MG immediate release tablet Commonly known as:  Oxy IR/ROXICODONE Take 5 mg by mouth every 6 (six) hours as needed for severe pain.   tiZANidine 4 MG capsule Commonly known as:  ZANAFLEX Take 4 mg by mouth 3 (three) times daily.   torsemide 10 MG tablet Commonly known as:  DEMADEX Take 10 mg by mouth daily.       Review of Systems   GENERAL:  no fevers, fatigue, appetite changes weight actually appears to be stable SKIN: No itching, rash or wounds    RESPIRATORY: No cough, wheezing, SOB CARDIAC: No chest pain, palpitations, has increased  lower extremity edema  GI: No abdominal pain, No N/V/D or constipation, No heartburn or reflux     MUSCULOSKELETAL At times continues to complain of pain   however complaint today is more in the left leg versus the right-she does appear to have some increased edema NEUROLOGIC: No headache, dizziness or focal weakness PSYCHIATRIC: No c/o anxiety or sadness   Immunization History  Administered Date(s) Administered  . Influenza,inj,Quad PF,36+ Mos 04/08/2013  . Tdap 03/23/2015   Pertinent  Health Maintenance Due  Topic Date Due  . MAMMOGRAM  12/13/2015  . INFLUENZA VACCINE  05/16/2016 (Originally 01/15/2016)  . PAP SMEAR  03/22/2018  . COLONOSCOPY  06/03/2018   Fall Risk  07/13/2015  Falls in the past year? No   Functional Status Survey:    Vitals:   02/15/16 1024  BP: 128/82  Pulse: 82  Resp: 18  Temp: 97.9 F (36.6 C)  TempSrc: Oral  SpO2: 98%  Weight: 268 lb 6.4 oz (121.7 kg)  Height: 5\' 3"  (1.6 m)  Load updated blood pressure is 141/67-pulses 59 on exam Body mass index is 47.54 kg/m. Physical Exam  GENERAL APPEARANCE: Alert, conversant, No acute distress and comfortably in her wheelchair.   Her skin is warm and dry  .  NOSE: No deformity or discharge.  MOUTH/THROAT: pharynx clear mucous membranes moist RESPIRATORY: Breathing is even, unlabored. Lung sounds are clear  CARDIOVASCULAR: Heart RRR no murmurs, rubs or gallops. Pre-slower extremity edema I would say trace to 1+ on the right and 1-2 plus on the left-pedal pulses are palpable but somewhat reduced. Could not really appreciate acute tenderness to the left leg although there is increased edema  GASTROINTESTINAL: Abdomen is soft, non-tender, not distended w/ normal bowel  sounds.   MUSCULOSKELETAL: Is able to move all extremities 4 ambulating in a wheelchair   NEUROLOGIC: Cranial nerves 2-12 grossly intact. Moves all extremities  PSYCHIATRIC: Mood and affect appropriate to situation, no behavioral issues Labs reviewed: 02/13/2016.  Sodium 141 potassium 4.1 BUN 32.3 creatinine 1.22 CO2 level XXVII.--I note glucose is 121  WBC 4.4 hemoglobin 12.3 platelets 211.    Recent Labs  09/14/15 1209 09/21/15 0957 01/12/16 2350  NA 142 141 139  K 4.1 3.9 3.9  CL 108 105 105  CO2 23 30 27   GLUCOSE 101* 90 103*  BUN 18 19 18   CREATININE 0.82 0.84 1.10*  CALCIUM 9.0 9.5 9.7    Recent Labs  03/23/15 1430 09/21/15 0957  AST 17 15  ALT 17 17  ALKPHOS 88 81  BILITOT 0.3 0.4  PROT 7.5 7.7  ALBUMIN 4.1 4.2    Recent Labs  03/23/15 1509 09/21/15 0957 01/12/16 2350  WBC 6.1 6.7 6.4  NEUTROABS 3.0 3.6 3.9  HGB 12.0 12.4 12.3  HCT 35.7* 36.5 36.8  MCV 82.6 83.7 85.8  PLT 290 262.0 260   Lab Results  Component Value Date   TSH 1.53 03/23/2015   Lab Results  Component Value Date   HGBA1C 6.4 05/22/2014   Lab Results  Component Value Date   CHOL 175 03/23/2015   HDL 58.00 03/23/2015   LDLCALC 91 03/23/2015   TRIG 131.0 03/23/2015   CHOLHDL 3 03/23/2015    Significant Diagnostic Results in last 30 days:  No results found.  Assessment/Plan #1 history of edema with history CHF-she appears to have increased edema although weights appear to be relatively stable-edema does appear significant however.  Will increase her Demadex up to 20 mg a day -also will update a metabolic panel next week to assess renal function as well as electrolytes.  Also appears to have increased edema especially of the left leg will order a venous Doppler to rule out DVT  Also will order TED hose to wear in the a.m. off in the p.m and encourage leg elevation   Also check weights daily notify provider of gain greater than 3 pounds  She continues on a beta  blocker.  #2 in regards to pain management   wll continue current regimen since changes were made just last week-her pain appears to be intermittent more in the left leg and would like to see with a venous Doppler tells us and monitor \\at this point continue oxycodone 5 mg every 4 hours when necessary  #3 mildly elevated glucose of metabolic panel 121-will order a hemoglobin A1c per chart review I see hemoglobin A1c done in December 2015 was 6.4 we will update this   [id number]-of note greater than 35 minutes spent assessing patient reviewing her chart her labs her investigational studies-and coordinating and formulating a plan of care of note greater than 50% of time spent coordinating plan a care with above studies--of note greater than 50% of time spent coordinating plan of care with input as noted above        London SheerLuster, Sally C, [place]MA [phone]

## 2016-02-20 ENCOUNTER — Non-Acute Institutional Stay (SKILLED_NURSING_FACILITY): Payer: BLUE CROSS/BLUE SHIELD | Admitting: Adult Health

## 2016-02-20 ENCOUNTER — Encounter: Payer: Self-pay | Admitting: Adult Health

## 2016-02-20 DIAGNOSIS — I110 Hypertensive heart disease with heart failure: Secondary | ICD-10-CM | POA: Diagnosis not present

## 2016-02-20 DIAGNOSIS — M5410 Radiculopathy, site unspecified: Secondary | ICD-10-CM

## 2016-02-20 DIAGNOSIS — I5032 Chronic diastolic (congestive) heart failure: Secondary | ICD-10-CM

## 2016-02-20 DIAGNOSIS — K219 Gastro-esophageal reflux disease without esophagitis: Secondary | ICD-10-CM

## 2016-02-20 NOTE — Progress Notes (Signed)
Patient ID: Briana PortelaBrenda Miller, female   DOB: 1955/12/05, 60 y.o.   MRN: [id number]     Location:   [facility] Room Number: 125-A Place of Service:  SNF (31)   CODE STATUS: Full Code  No Known Allergies  Chief Complaint  Patient presents with  . Medical Management of Chronic Issues    Follow up    HPI:  She is a resident of this facility being seen for the management of her chronic illnesses. She is complaining of left thigh and groin back. She tells me the pain is in her hip and radiates to her groin. There are no nursing concerns at this time.    Past Medical History:  Diagnosis Date  . Allergy   . Anemia   . Arthritis   . Cardiomyopathy (HCC)   . CHF (congestive heart failure) (HCC)   . GERD (gastroesophageal reflux disease)   . Hyperlipidemia   . Hypertension   . Overactive bladder     Past Surgical History:  Procedure Laterality Date  . COLONOSCOPY    . KNEE ARTHROSCOPY     2007  . ROTATOR CUFF REPAIR Right 03/23/13  . TOTAL KNEE ARTHROPLASTY  09/22/2011   Procedure: TOTAL KNEE ARTHROPLASTY;  Surgeon: Raymon MuttonStephen D Lucey, MD;  Location: [facility] OR;  Service: Orthopedics;  Laterality: Right;    Social History   Social History  . Marital status: Married    Spouse name: N/A  . Number of children: 2  . Years of education: N/A   Occupational History  .  [employer]   Social History Main Topics  . Smoking status: Never Smoker  . Smokeless tobacco: Never Used  . Alcohol use 1.2 oz/week    2 Glasses of wine per week     Comment: None at present. Usually has 2 glasses wine daily  . Drug use: No  . Sexual activity: Not on file   Other Topics Concern  . 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 Cancer Mother     colon 7660 and breast 3354- deceased  . Colon cancer Mother   . Diabetes Father     living  . Asthma Father   . Hypertension Father   . Heart attack Father [id number]    medical management per pt  . Glaucoma Father     had eye transplant  . Kidney failure Father     acute renal failure- died 7292  . Esophageal cancer Neg Hx   . Stomach cancer Neg Hx   . Rectal cancer Neg Hx       VITAL SIGNS BP 138/78   Pulse 86   Temp 97.9 F (36.6 C) (Oral)   Resp 20   Ht 5\' 3"  (1.6 m)   Wt 264 lb 5 oz (119.9 kg)   LMP 06/17/2007   SpO2 98%   BMI 46.82 kg/m   Patient's Medications  New Prescriptions   No medications on file  Previous Medications   ALBUTEROL (PROVENTIL HFA;VENTOLIN HFA) 108 (90 BASE) MCG/ACT INHALER    Inhale 2 puffs into the lungs  every 6 (six) hours as needed for wheezing or shortness of breath.   CALCIUM-VITAMIN D (OSCAL WITH D) 500-200 MG-UNIT PER TABLET    Take 1 tablet by mouth daily.   DOCUSATE SODIUM (COLACE) 100 MG CAPSULE    Take 1 capsule (100 mg total) by mouth every 12 (twelve) hours.   LOSARTAN (COZAAR) 50 MG TABLET    Take 1 tablet (50 mg total) by mouth daily.   MELATONIN 3 MG TABS    Take 1 tablet by mouth at bedtime.   METOPROLOL SUCCINATE (TOPROL-XL) 50 MG 24 HR TABLET    TAKE 1 TABLET BY MOUTH EVERY DAY   OXYCODONE (OXY IR/ROXICODONE) 5 MG IMMEDIATE RELEASE TABLET    Take 5 mg by mouth every 6 (six) hours as needed for severe pain.   TIZANIDINE (ZANAFLEX) 4 MG CAPSULE    Take 4 mg by mouth 3 (three) times daily.   TORSEMIDE (DEMADEX) 20 MG TABLET    Take 20 mg by mouth daily.  Modified Medications   No medications on file  Discontinued Medications   TORSEMIDE (DEMADEX) 10 MG TABLET    Take 10 mg by mouth daily.     SIGNIFICANT DIAGNOSTIC EXAMS  01-04-16: 2-d echo: Left ventricle: The cavity size was mildly dilated. Wall thickness was increased in a pattern of mild LVH. Systolic  function was mildly reduced. The estimated ejection fraction was in the range of 45% to 50%. Mild diffuse hypokinesis with no identifiable regional variations. Features are consistent with a pseudonormal left ventricular filling pattern, with concomitant abnormal relaxation and increased filling pressure (grade 2 diastolic dysfunction). - Mitral valve: There was mild regurgitation. - Left atrium: The atrium was moderately dilated. - Pulmonary arteries: Systolic pressure was mildly increased. PA peak pressure: 42 mm Hg (S).   LABS REVIEWED:   02-13-16: wbc 4.4; hgb 12.3; hct 37.7; mcv 83.6; plt 211; glucose 121; bun 32.3; creat 1.22; k+ 4.1; na++ 141    Review of Systems  Constitutional: Negative for malaise/fatigue.  Respiratory: Negative for cough and shortness of breath.   Cardiovascular: Negative for chest pain, palpitations and leg swelling.  Gastrointestinal: Negative for abdominal pain, constipation and heartburn.  Musculoskeletal: Positive for joint pain and myalgias. Negative for back pain.       Has left thigh and hip pain   Skin: Negative.   Neurological: Negative for dizziness.  Psychiatric/Behavioral: The patient is not nervous/anxious.     Physical Exam  Constitutional: No distress.  Morbid obesity   Eyes: Conjunctivae are normal.  Neck: Neck supple. No JVD present. No thyromegaly present.  Cardiovascular: Normal rate, regular rhythm and intact distal pulses.   Respiratory: Effort normal and breath sounds normal. No respiratory distress. She has no wheezes.  GI: Soft. Bowel sounds are normal. She exhibits no distension. There is no tenderness.  Musculoskeletal: She exhibits no edema.  Able to move all extremities  Has lumbar spine tenderness on left side   Lymphadenopathy:    She has no cervical adenopathy.  Neurological: She is alert.  Skin: Skin is warm and dry. She is not diaphoretic.  Psychiatric: She has a normal mood and affect.       ASSESSMENT/  PLAN:  1.  Hypertension: will continue cozaar 50 mg daily and toprol xl 50 mg daily   2. CHF: is presently stable; EF is 45-50% (01-04-16): will continue demadex 20 mg daily   3. Gerd: is stable; will monitor   4. Constipation: will continue colace twice daily  5. Lumbar back pain with radiculopathy: will have therapy see her back pain; will setup lumbar x-ray; will begin prednisone 40 mg daily for 5 days to help with inflammation; will begin mobic 15 mg daily for pain. Has oxycodone 5 mg every  hours as needed for pain will monitor      Synthia Innocent NP [facility]  Contact [phone] Monday through Friday 8am- 5pm  After hours call [phone]

## 2016-02-21 LAB — BASIC METABOLIC PANEL
BUN: 19 mg/dL (ref 4–21)
CREATININE: 1.1 mg/dL (ref 0.5–1.1)
GLUCOSE: 80 mg/dL
Potassium: 4.4 mmol/L (ref 3.4–5.3)
SODIUM: 143 mmol/L (ref 137–147)

## 2016-02-21 LAB — HEMOGLOBIN A1C: HEMOGLOBIN A1C: 6.4

## 2016-02-25 ENCOUNTER — Ambulatory Visit (INDEPENDENT_AMBULATORY_CARE_PROVIDER_SITE_OTHER): Payer: BLUE CROSS/BLUE SHIELD | Admitting: Family

## 2016-02-25 ENCOUNTER — Encounter: Payer: Self-pay | Admitting: Family

## 2016-02-25 VITALS — BP 140/73 | HR 64 | Temp 98.1°F | Resp 20 | Ht 66.0 in

## 2016-02-25 DIAGNOSIS — I10 Essential (primary) hypertension: Secondary | ICD-10-CM | POA: Insufficient documentation

## 2016-02-25 DIAGNOSIS — I5032 Chronic diastolic (congestive) heart failure: Secondary | ICD-10-CM | POA: Diagnosis not present

## 2016-02-25 DIAGNOSIS — Z0000 Encounter for general adult medical examination without abnormal findings: Secondary | ICD-10-CM

## 2016-02-25 DIAGNOSIS — S82001D Unspecified fracture of right patella, subsequent encounter for closed fracture with routine healing: Secondary | ICD-10-CM

## 2016-02-25 NOTE — Progress Notes (Signed)
Pre visit review using our clinic review tool, if applicable. No additional management support is needed unless otherwise documented below in the visit note. 

## 2016-02-25 NOTE — Assessment & Plan Note (Signed)
R patellar fracture- pain continues to limit her ability to walk.  I think it is reasonable at this point for her to d/c home and continue [facility] PT. I have advised pt to express her wishes for discharge to her PT and the provider at the facility and that they can facilitate [facility] PT/OT and her discharge.

## 2016-02-25 NOTE — Progress Notes (Signed)
Subjective:    Patient ID: Briana Miller, female    DOB: 12/07/1955, 60 y.o.   MRN: [id number]  HPI  Briana Miller is a 60 yr old female who presents today for follow up. She unfortunately sustained a mechanical fall on 01/04/16 fracturing her right patella. She has had a lot of pain and difficulty walking since that time. She was evaluated in the ER on 01/12/16 due to knee pain and inability to ambulate.  X ray noted fractured patella. She stayed 2 days in the hospital. She was then sent to a SNF for ongoing rehabilitation. She has been in [facility] rehab since her d/c from the hospital on 01/16/16.  She is still having trouble ambulating due to the right knee pain.  She saw Dr. Valentina GuLucy 2 weeks ago and has follow up in October. States that he is unable to rx any additional pain medication for her due to the new pain med rules. She lives with her husband who works during the day. Daughter lives nearby and is available to help during the day. Reports she is independent with bathing/toileting, though she has limited ability to ambulate. States she wants to be d/cd from the SNF and to continue her PT at home.   Review of Systems See HPI  Past Medical History:  Diagnosis Date  . Allergy   . Anemia   . Arthritis   . Cardiomyopathy (HCC)   . CHF (congestive heart failure) (HCC)   . GERD (gastroesophageal reflux disease)   . Hyperlipidemia   . Hypertension   . Overactive bladder      Social History   Social History  . Marital status: Married    Spouse name: N/A  . Number of children: 2  . Years of education: N/A   Occupational History  .  [employer]   Social History Main Topics  . Smoking status: Never Smoker  . Smokeless tobacco: Never Used  . Alcohol use 1.2 oz/week    2 Glasses of wine per week     Comment: None at present. Usually has 2 glasses wine daily  . Drug use: No  . Sexual activity: Not on file   Other Topics Concern  . 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 COLONOSCOPY    . KNEE ARTHROSCOPY     2007  . ROTATOR CUFF REPAIR Right 03/23/13  . TOTAL KNEE ARTHROPLASTY  09/22/2011   Procedure: TOTAL KNEE ARTHROPLASTY;  Surgeon: Raymon MuttonStephen D Lucey, MD;  Location: [facility] OR;  Service: Orthopedics;  Laterality: Right;    Family History  Problem Relation Age of Onset  . Cancer Mother     colon 6560 and breast 7654- deceased  . Colon cancer Mother   . Diabetes Father     living  . Asthma Father   . Hypertension Father   . Heart attack Father 2479    medical management per pt  . Glaucoma Father     had eye transplant  . Kidney failure Father     acute renal failure- died 5192  . Esophageal cancer Neg Hx   .  Stomach cancer Neg Hx   . Rectal cancer Neg Hx     No Known Allergies  Current Outpatient Prescriptions on File Prior to Visit  Medication Sig Dispense Refill  . albuterol (PROVENTIL HFA;VENTOLIN HFA) 108 (90 BASE) MCG/ACT inhaler Inhale 2 puffs into the lungs every 6 (six) hours as needed for wheezing or shortness of breath. 1 Inhaler 0  . calcium-vitamin D (OSCAL WITH D) 500-200 MG-UNIT per tablet Take 1 tablet by mouth daily.    Marland Kitchen docusate sodium (COLACE) 100 MG capsule Take 1 capsule (100 mg total) by mouth every 12 (twelve) hours. 60 capsule 0  . losartan (COZAAR) 50 MG tablet Take 1 tablet (50 mg total) by mouth daily. 30 tablet 3  . Melatonin 3 MG TABS Take 1 tablet by mouth at bedtime.    . metoprolol succinate (TOPROL-XL) 50 MG 24 hr tablet TAKE 1 TABLET BY MOUTH EVERY DAY 90 tablet 1  . oxyCODONE (OXY IR/ROXICODONE) 5 MG immediate release tablet Take 5 mg by mouth every 6 (six) hours as needed for severe pain.    Marland Kitchen tiZANidine (ZANAFLEX)  4 MG capsule Take 4 mg by mouth 3 (three) times daily.    Marland Kitchen torsemide (DEMADEX) 20 MG tablet Take 20 mg by mouth daily.     No current facility-administered medications on file prior to visit.     BP 140/73 (BP Location: Right Arm, Cuff Size: Large)   Pulse 64   Temp 98.1 F (36.7 C) (Oral)   Resp 20   Ht 5\' 6"  (1.676 m)   LMP 06/17/2007   SpO2 100% Comment: room air      Objective:   Physical Exam  Constitutional: She is oriented to person, place, and time. She appears well-developed and well-nourished.  Cardiovascular: Normal rate, regular rhythm and normal heart sounds.   No murmur heard. Pulmonary/Chest: Effort normal and breath sounds normal. No respiratory distress. She has no wheezes.  Musculoskeletal:  2+ bilateral LE edema  Neurological: She is alert and oriented to person, place, and time.  Skin: Skin is warm and dry.  Psychiatric: She has a normal mood and affect. Her behavior is normal. Judgment and thought content normal.          Assessment & Plan:

## 2016-02-25 NOTE — Assessment & Plan Note (Signed)
Fair BP. Continue losaratan, toprol xl, demadex.

## 2016-02-25 NOTE — Assessment & Plan Note (Signed)
Clinically stable, contineu demadex.

## 2016-02-25 NOTE — Patient Instructions (Signed)
Follow up in 3 months

## 2016-03-05 ENCOUNTER — Encounter: Payer: Self-pay | Admitting: Internal Medicine

## 2016-03-05 ENCOUNTER — Non-Acute Institutional Stay (SKILLED_NURSING_FACILITY): Payer: BLUE CROSS/BLUE SHIELD | Admitting: Internal Medicine

## 2016-03-05 DIAGNOSIS — I420 Dilated cardiomyopathy: Secondary | ICD-10-CM | POA: Diagnosis not present

## 2016-03-05 DIAGNOSIS — N289 Disorder of kidney and ureter, unspecified: Secondary | ICD-10-CM

## 2016-03-05 DIAGNOSIS — M79605 Pain in left leg: Secondary | ICD-10-CM | POA: Diagnosis not present

## 2016-03-05 LAB — BASIC METABOLIC PANEL
BUN: 29 mg/dL — AB (ref 4–21)
CREATININE: 1.3 mg/dL — AB (ref 0.5–1.1)
GLUCOSE: 94 mg/dL
Potassium: 4.3 mmol/L (ref 3.4–5.3)
Sodium: 144 mmol/L (ref 137–147)

## 2016-03-05 NOTE — Progress Notes (Signed)
Patient ID: Briana Miller, female   DOB: 1955/11/03, 60 y.o.   MRN: [id number]    Location:  [facility] Room Number: 125-A Place of Service:  SNF (31) Provider:  Edmon Crape, PA-C  Lemont Fillers., NP  Patient Care Team: Sandford Craze, NP as PCP - General (Internal Medicine)  Extended Emergency Contact Information Primary Emergency Contact: [facility] Address: [address]          [place], [place] [address] [place] Home Phone: [phone] Mobile Phone: [phone] Relation: Spouse  Code Status:  Full Code Goals of care: Advanced Directive information Advanced Directives 03/05/2016  Does patient have an advance directive? No  Type of Advance Directive -  Does patient want to make changes to advanced directive? -  Copy of advanced directive(s) in chart? -  Would patient like information on creating an advanced directive? -  Pre-existing out of facility DNR order (yellow form or pink MOST form) -     Chief Complaint  Patient presents with  . Acute Visit    Leg pain    HPI:  Pt is a 60 y.o. female seen today for an acute visit for Complaints of left leg pain.  She is here for rehabilitation after sustaining up tell of fracture on the right after a fall-apparently she will be going home fairly soon-she is on OxyIR 5 mg every 6 hours when necessary pain-she actually is seen orthopedics couple weeks ago but they were hasn't to increase her pain medication.  She is also on Zanaflex 4 mg 3 times a day and Mobic 15 mg daily.  She states the pain in her left leg is intermittent and feels like tingling and pins and needles this is more her upper leg and again is intermittent she says she feels it a bit more at night when it does occur.  Apparently this isn't happening for some time.     Past Medical History:  Diagnosis Date  . Allergy   . Anemia   . Arthritis   . Cardiomyopathy (HCC)   . CHF (congestive heart  failure) (HCC)   . GERD (gastroesophageal reflux disease)   . Hyperlipidemia   . Hypertension   . Overactive bladder    Past Surgical History:  Procedure Laterality Date  . COLONOSCOPY    . KNEE ARTHROSCOPY     2007  . ROTATOR CUFF REPAIR Right 03/23/13  . TOTAL KNEE ARTHROPLASTY  09/22/2011   Procedure: TOTAL KNEE ARTHROPLASTY;  Surgeon: Raymon Mutton, MD;  Location: [facility] OR;  Service: Orthopedics;  Laterality: Right;    No Known Allergies    Medication List       Accurate as of 03/05/16 12:48 PM. Always use your most recent med list.          albuterol 108 (90 Base) MCG/ACT inhaler Commonly known as:  PROVENTIL HFA;VENTOLIN HFA Inhale 2 puffs into the lungs every 6 (six) hours as needed for wheezing or shortness of breath.   calcium-vitamin D 500-200 MG-UNIT tablet Commonly known as:  OSCAL WITH D Take 1 tablet by mouth daily.   docusate sodium 100 MG capsule Commonly known as:  COLACE Take 1 capsule (100 mg total) by mouth every 12 (twelve) hours.   losartan 50 MG tablet Commonly known as:  COZAAR Take 1 tablet (50 mg total) by mouth daily.   Melatonin 3 MG Tabs Take 1 tablet by mouth at bedtime.   meloxicam 15 MG tablet Commonly known as:  MOBIC Take 15 mg by mouth daily.   metoprolol succinate 50 MG 24 hr tablet Commonly known as:  TOPROL-XL TAKE 1 TABLET BY MOUTH EVERY DAY   oxyCODONE 5 MG immediate release tablet Commonly known as:  Oxy IR/ROXICODONE Take 5 mg by mouth every 6 (six) hours as needed for severe pain.   tiZANidine 4 MG capsule Commonly known as:  ZANAFLEX Take 4 mg by mouth 3 (three) times daily.   torsemide 20 MG tablet Commonly known as:  DEMADEX Take 20 mg by mouth daily.       Review of Systems GENERAL: no fevers, fatigue, appetite changes SKIN: No itching, rash or wounds    RESPIRATORY: No cough, wheezing, SOB CARDIAC: No chest pain, palpitations, has increased lower extremity edema  GI: No abdominal pain, No  N/V/D or constipation, No heartburn or reflux    MUSCULOSKELETAL At this point complaining more of left leg pain that is intermittent as noted above  NEUROLOGIC: No headache, dizziness or focal weakness at times does have pins and needles feeling in her left upper leg PSYCHIATRIC: No c/o anxiety or sadness  Immunization History  Administered Date(s) Administered  . Influenza,inj,Quad PF,36+ Mos 04/08/2013  . Tdap 03/23/2015   Pertinent  Health Maintenance Due  Topic Date Due  . MAMMOGRAM  12/13/2015  . INFLUENZA VACCINE  02/19/2017 (Originally 01/15/2016)  . PAP SMEAR  03/22/2018  . COLONOSCOPY  06/03/2018   Fall Risk  07/13/2015  Falls in the past year? No   Functional Status Survey:    Vitals:   03/05/16 1235  BP: 122/84  Pulse: 88  Resp: 16  Temp: 97.9 F (36.6 C)  TempSrc: Oral  SpO2: 98%  Weight: 261 lb (118.4 kg)  Height: 5\' 6"  (1.676 m)   Body mass index is 42.13 kg/m. Physical Exam GENERAL APPEARANCE: Alert, conversant, No acute distress sitting comfortably in her wheelchair.   Her skin is warm and dry  .  NOSE: No deformity or discharge.  MOUTH/THROAT: pharynx clear mucous membranes moist RESPIRATORY: Breathing is even, unlabored. Lung sounds are clear  CARDIOVASCULAR: Heart RRR no murmurs, rubs or gallops. Pre-slower extremity edema I would say trace to 1+ on the right and 1 plus on the left-pedal pulses are palpable   GASTROINTESTINAL: Abdomen is soft, non-tender, not distended w/ normal bowel sounds.   MUSCULOSKELETAL:Is able to move all extremities 4 ambulating in a wheelchair  Left leg does not have any significant erythema or tenderness to palpation or significant warmth.  Pedal pulse again is intact cap refill is intact she appears able to move her leg flex and extend at the knee at baseline without acute discomfort.  Touch sensation is intact  NEUROLOGIC: Cranial nerves 2-12 grossly intact. Moves all extremities  PSYCHIATRIC:  Mood and affect appropriate to situation, no behavioral issues Labs reviewed:  Labs this week show sodium 144 potassium 4.3 BUN of 28.9 creatinine of 1.32  Recent Labs  09/14/15 1209 09/21/15 0957 01/12/16 2350 02/13/16  NA 142 141 139 141  K 4.1 3.9 3.9 4.1  CL 108 105 105  --   CO2 23 30 27   --   GLUCOSE 101* 90 103*  --   BUN 18 19 18  32*  CREATININE 0.82 0.84 1.10* 1.2*  CALCIUM 9.0 9.5 9.7  --     Recent Labs  03/23/15 1430 09/21/15 0957  AST 17 15  ALT 17 17  ALKPHOS 88 81  BILITOT 0.3 0.4  PROT 7.5 7.7  ALBUMIN  4.1 4.2    Recent Labs  03/23/15 1509 09/21/15 0957 01/12/16 2350 02/13/16  WBC 6.1 6.7 6.4 4.4  NEUTROABS 3.0 3.6 3.9  --   HGB 12.0 12.4 12.3 12.3  HCT 35.7* 36.5 36.8 38  MCV 82.6 83.7 85.8  --   PLT 290 262.0 260 211   Lab Results  Component Value Date   TSH 1.53 03/23/2015   Lab Results  Component Value Date   HGBA1C 6.4 05/22/2014   Lab Results  Component Value Date   CHOL 175 03/23/2015   HDL 58.00 03/23/2015   LDLCALC 91 03/23/2015   TRIG 131.0 03/23/2015   CHOLHDL 3 03/23/2015    Significant Diagnostic Results in last 30 days:  No results found.  Assessment/Plan  #1 left leg discomfort-this was discussed with Destany-this appears to possibly be neuropathic-will order a neurology consult secondary to this for follow-up possibly a nerve conduction study she is agreeable to this.  At this point is not having any acute discomfort she says this is intermittent.  #2 is some slightly increased renal insufficiency with a creatinine of 1.32 earlier this month creatinine was 1.1 she is on Demadex 20 mg day with history of CHF consider holding this for a couple days to help with her renal function-an update a BMP next week.  Her weight appears to be stable edema appears to be stabilized she is not complaining of any shortness of breath or chest pain  [phone]

## 2016-03-09 DIAGNOSIS — M545 Low back pain, unspecified: Secondary | ICD-10-CM | POA: Insufficient documentation

## 2016-03-13 ENCOUNTER — Non-Acute Institutional Stay (SKILLED_NURSING_FACILITY): Payer: BLUE CROSS/BLUE SHIELD | Admitting: Internal Medicine

## 2016-03-13 ENCOUNTER — Encounter: Payer: Self-pay | Admitting: Internal Medicine

## 2016-03-13 DIAGNOSIS — I952 Hypotension due to drugs: Secondary | ICD-10-CM | POA: Diagnosis not present

## 2016-03-13 DIAGNOSIS — Z8679 Personal history of other diseases of the circulatory system: Secondary | ICD-10-CM | POA: Diagnosis not present

## 2016-03-13 DIAGNOSIS — I5032 Chronic diastolic (congestive) heart failure: Secondary | ICD-10-CM | POA: Diagnosis not present

## 2016-03-13 NOTE — Progress Notes (Signed)
Patient ID: Briana Miller, female   DOB: [birth year], 60 y.o.   MRN: [id number]    DATE:  03/13/2016  Location:    [facility] Room Number: 850 A Place of Service: SNF (31)   Extended Emergency Contact Information Primary Emergency Contact: [place] Address: [address]          [place], [place] [address] [place] Phone: [phone] Mobile Phone: [phone] Relation: Spouse  Advanced Directive information Does patient have an advance directive?: Yes, Does patient want to make changes to advanced directive?: No - Patient declined  Chief Complaint  Patient presents with  . Acute Visit    hypotension    HPI:  60 yo female seen today for hypotension reported by nursing. BP dropped with therapy to 72/40 yesterday and improved to 101/50 with leg elevation and rest. Today, pt reports fatigue and weakness while sitting in w/c. BP 90/68. No CP, SOB or palpitations. No HA/dizziness.  Hypertension - takes cozaar 50 mg daily and toprol xl 50 mg daily   CHF - stable; EF is 45-50% (01-04-16); takes demadex 20 mg daily   GERD - stable    Constipation - stable on colace twice daily   Lumbar back pain with radiculopathy - stable on mobic 15 mg daily for pain; oxycodone 5 mg every  hours as needed for pain    Past Medical History:  Diagnosis Date  . Allergy   . Anemia   . Arthritis   . Cardiomyopathy ([facility])   . CHF (congestive heart failure) ([facility])   . GERD (gastroesophageal reflux disease)   . Hyperlipidemia   . Hypertension   . Overactive bladder     Past Surgical History:  Procedure Laterality Date  . COLONOSCOPY    . KNEE ARTHROSCOPY     2007  . ROTATOR CUFF REPAIR Right 03/23/13  . TOTAL KNEE ARTHROPLASTY  09/22/2011   Procedure: TOTAL KNEE ARTHROPLASTY;  Surgeon: Rudean Haskell, MD;  Location: [facility];  Service: Orthopedics;  Laterality: Right;    Patient Care Team: Debbrah Alar, NP as PCP - General (Internal  Medicine)  Social History   Social History  . Marital status: Married    Spouse name: N/A  . Number of children: 2  . Years of education: N/A   Occupational History  .  [employer]   Social History Main Topics  . Smoking status: Never Smoker  . Smokeless tobacco: Never Used  . Alcohol use 1.2 oz/week    2 Glasses of wine per week     Comment: None at present. Usually has 2 glasses wine daily  . Drug use: No  . Sexual activity: Not on file   Other Topics Concern  . 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 She has never used smokeless tobacco. She reports that she drinks about 1.2 oz of alcohol per week . She reports that she does not use drugs.  Family History  Problem Relation Age of Onset  . Cancer Mother     colon 23 and breast 53- deceased  . Colon cancer Mother   .  Diabetes Father     living  . Asthma Father   . Hypertension Father   . Heart attack Father 19    medical management per pt  . Glaucoma Father     had eye transplant  . Kidney failure Father     acute renal failure- died 19  . Esophageal cancer Neg Hx   . Stomach cancer Neg Hx   . Rectal cancer Neg Hx    Family Status  Relation Status  . Mother Deceased   breast cancer  . Father Alive  . Sister Alive   healthy  . Neg Hx     Immunization History  Administered Date(s) Administered  . Influenza,inj,Quad PF,36+ Mos 04/08/2013  . Tdap 03/23/2015    No Known Allergies  Medications: Patient's Medications  New Prescriptions   No medications on file  Previous Medications   ALBUTEROL (PROVENTIL HFA;VENTOLIN HFA) 108 (90 BASE) MCG/ACT INHALER    Inhale 2 puffs into the lungs every 6 (six) hours  as needed for wheezing or shortness of breath.   CALCIUM-VITAMIN D (OSCAL WITH D) 500-200 MG-UNIT PER TABLET    Take 1 tablet by mouth daily.   DOCUSATE SODIUM (COLACE) 100 MG CAPSULE    Take 1 capsule (100 mg total) by mouth every 12 (twelve) hours.   LOSARTAN (COZAAR) 50 MG TABLET    Take 1 tablet (50 mg total) by mouth daily.   MELATONIN 3 MG TABS    Take 1 tablet by mouth at bedtime.   MELOXICAM (MOBIC) 15 MG TABLET    Take 15 mg by mouth daily.   METOPROLOL SUCCINATE (TOPROL-XL) 50 MG 24 HR TABLET    TAKE 1 TABLET BY MOUTH EVERY DAY   OXYCODONE (OXY IR/ROXICODONE) 5 MG IMMEDIATE RELEASE TABLET    Take 5 mg by mouth every 6 (six) hours as needed for severe pain.   TIZANIDINE (ZANAFLEX) 4 MG CAPSULE    Take 4 mg by mouth 3 (three) times daily as needed.    TORSEMIDE (DEMADEX) 10 MG TABLET    Take 10 mg by mouth daily.   Modified Medications   No medications on file  Discontinued Medications   No medications on file    Review of Systems  Constitutional: Positive for fatigue.  Musculoskeletal: Positive for arthralgias.  Neurological: Positive for weakness.  All other systems reviewed and are negative.   Vitals:   03/13/16 1544  BP: 136/88  Pulse: 90  Resp: 20  Temp: 98 F (36.7 C)  TempSrc: Oral  SpO2: 99%  Weight: 265 lb 6.4 oz (120.4 kg)  Height: 5' 6" (1.676 m)   Body mass index is 42.84 kg/m.  Physical Exam  Constitutional: She is oriented to person, place, and time.  Looks pale in NAD, sitting in w/c  HENT:  Mouth/Throat: Oropharynx is clear and moist. No oropharyngeal exudate.  MMM  Neck: Neck supple.  Cardiovascular: Normal rate, regular rhythm and intact distal pulses.  Exam reveals no gallop and no friction rub.   Murmur (1/6 SEM) heard. Pulmonary/Chest: Effort normal and breath sounds normal. No respiratory distress. She has no wheezes. She has no rales.  Abdominal: Soft. Bowel sounds are normal. She exhibits no distension and no mass. There is no  tenderness. There is no rebound and no guarding.  Musculoskeletal: She exhibits edema and tenderness.  Lymphadenopathy:    She has no cervical adenopathy.  Neurological: She is alert and oriented to person, place, and time.  Skin: Skin is warm  and dry. No rash noted.  Psychiatric: She has a normal mood and affect. Her behavior is normal. Thought content normal.     Labs reviewed: Nursing Home on 03/13/2016  Component Date Value Ref Range Status  . Glucose 03/05/2016 94  mg/dL Final  . BUN 03/05/2016 29* 4 - 21 mg/dL Final  . Creatinine 03/05/2016 1.3* 0.5 - 1.1 mg/dL Final  . Potassium 03/05/2016 4.3  3.4 - 5.3 mmol/L Final  . Sodium 03/05/2016 144  137 - 147 mmol/L Final  Nursing Home on 03/05/2016  Component Date Value Ref Range Status  . Glucose 02/21/2016 80  mg/dL Final  . BUN 02/21/2016 19  4 - 21 mg/dL Final  . Creatinine 02/21/2016 1.1  0.5 - 1.1 mg/dL Final  . Potassium 02/21/2016 4.4  3.4 - 5.3 mmol/L Final  . Sodium 02/21/2016 143  137 - 147 mmol/L Final  . Hemoglobin A1C 02/21/2016 6.4   Final  Nursing Home on 02/20/2016  Component Date Value Ref Range Status  . Hemoglobin 02/13/2016 12.3  12.0 - 16.0 g/dL Final  . HCT 02/13/2016 38  36 - 46 % Final  . Platelets 02/13/2016 211  150 - 399 K/L Final  . WBC 02/13/2016 4.4  10^3/mL Final  . Glucose 02/13/2016 121  mg/dL Final  . BUN 02/13/2016 32* 4 - 21 mg/dL Final  . Creatinine 02/13/2016 1.2* 0.5 - 1.1 mg/dL Final  . Potassium 02/13/2016 4.1  3.4 - 5.3 mmol/L Final  . Sodium 02/13/2016 141  137 - 147 mmol/L Final  Admission on 01/12/2016, Discharged on 01/16/2016  Component Date Value Ref Range Status  . WBC 01/13/2016 6.4  4.0 - 10.5 K/uL Final  . RBC 01/13/2016 4.29  3.87 - 5.11 MIL/uL Final  . Hemoglobin 01/13/2016 12.3  12.0 - 15.0 g/dL Final  . HCT 01/13/2016 36.8  36.0 - 46.0 % Final  . MCV 01/13/2016 85.8  78.0 - 100.0 fL Final  . MCH 01/13/2016 28.7  26.0 - 34.0 pg Final  . MCHC 01/13/2016 33.4  30.0 -  36.0 g/dL Final  . RDW 01/13/2016 13.3  11.5 - 15.5 % Final  . Platelets 01/13/2016 260  150 - 400 K/uL Final  . Neutrophils Relative % 01/13/2016 60  % Final  . Neutro Abs 01/13/2016 3.9  1.7 - 7.7 K/uL Final  . Lymphocytes Relative 01/13/2016 25  % Final  . Lymphs Abs 01/13/2016 1.6  0.7 - 4.0 K/uL Final  . Monocytes Relative 01/13/2016 10  % Final  . Monocytes Absolute 01/13/2016 0.6  0.1 - 1.0 K/uL Final  . Eosinophils Relative 01/13/2016 5  % Final  . Eosinophils Absolute 01/13/2016 0.3  0.0 - 0.7 K/uL Final  . Basophils Relative 01/13/2016 0  % Final  . Basophils Absolute 01/13/2016 0.0  0.0 - 0.1 K/uL Final  . Sodium 01/13/2016 139  135 - 145 mmol/L Final  . Potassium 01/13/2016 3.9  3.5 - 5.1 mmol/L Final  . Chloride 01/13/2016 105  101 - 111 mmol/L Final  . CO2 01/13/2016 27  22 - 32 mmol/L Final  . Glucose, Bld 01/13/2016 103* 65 - 99 mg/dL Final  . BUN 01/13/2016 18  6 - 20 mg/dL Final  . Creatinine, Ser 01/13/2016 1.10* 0.44 - 1.00 mg/dL Final  . Calcium 01/13/2016 9.7  8.9 - 10.3 mg/dL Final  . GFR calc non Af Amer 01/13/2016 54* >60 mL/min Final  . GFR calc Af Amer 01/13/2016 >60  >60 mL/min Final   Comment: (NOTE)  The eGFR has been calculated using the CKD EPI equation. This calculation has not been validated in all clinical situations. eGFR's persistently <60 mL/min signify possible Chronic Kidney Disease.   . Anion gap 01/13/2016 7  5 - 15 Final  . Color, Urine 01/13/2016 YELLOW  YELLOW Final  . APPearance 01/13/2016 CLOUDY* CLEAR Final  . Specific Gravity, Urine 01/13/2016 1.026  1.005 - 1.030 Final  . pH 01/13/2016 6.0  5.0 - 8.0 Final  . Glucose, UA 01/13/2016 NEGATIVE  NEGATIVE mg/dL Final  . Hgb urine dipstick 01/13/2016 NEGATIVE  NEGATIVE Final  . Bilirubin Urine 01/13/2016 SMALL* NEGATIVE Final  . Ketones, ur 01/13/2016 15* NEGATIVE mg/dL Final  . Protein, ur 01/13/2016 NEGATIVE  NEGATIVE mg/dL Final  . Nitrite 01/13/2016 NEGATIVE  NEGATIVE Final  .  Leukocytes, UA 01/13/2016 NEGATIVE  NEGATIVE Final  . Troponin i, poc 01/13/2016 0.00  0.00 - 0.08 ng/mL Final  . Comment 3 01/13/2016          Final   Comment: Due to the release kinetics of cTnI, a negative result within the first hours of the onset of symptoms does not rule out myocardial infarction with certainty. If myocardial infarction is still suspected, repeat the test at appropriate intervals.     No results found.   Assessment/Plan   ICD-9-CM ICD-10-CM   1. Hypotension due to drugs for HTN/back pain 458.8 I95.2    E980.5    2. History of essential hypertension V12.59 Z86.79   3. Chronic diastolic congestive heart failure (HCC) 428.32 I50.32    428.0     Reduce torsemide to 61m daily  change zanaflex to 492mTID prn spasms  Check VS qshift x 3 days   Check orthostatic BP/HR  Will follow   S. CaPerlie GoldPiAsc[facility] [address] [phone] (Monday-Friday 8 AM - 5 PM) ([phone] 5 PM and follow prompts

## 2016-03-14 ENCOUNTER — Encounter: Payer: Self-pay | Admitting: Adult Health

## 2016-03-14 ENCOUNTER — Non-Acute Institutional Stay (SKILLED_NURSING_FACILITY): Payer: BLUE CROSS/BLUE SHIELD | Admitting: Adult Health

## 2016-03-14 DIAGNOSIS — M5410 Radiculopathy, site unspecified: Secondary | ICD-10-CM

## 2016-03-14 DIAGNOSIS — S82001D Unspecified fracture of right patella, subsequent encounter for closed fracture with routine healing: Secondary | ICD-10-CM | POA: Diagnosis not present

## 2016-03-14 DIAGNOSIS — I110 Hypertensive heart disease with heart failure: Secondary | ICD-10-CM | POA: Diagnosis not present

## 2016-03-14 NOTE — Progress Notes (Signed)
Patient ID: Briana PortelaBrenda Miller, female   DOB: 1955/09/12, 60 y.o.   MRN: [id number]   Location:   [facility] Room Number: 125-A Place of Service:  SNF (31)    CODE STATUS: Full Code  No Known Allergies  Chief Complaint  Patient presents with  . Discharge Note    Discharge from facility    HPI:  She is being discharged to home with home health for pr/ot/rn/. She will need a standard wheelchair; 3:1 commode and slide board. She will need her prescriptions to be written and will need to follow up with her pcp.    Past Medical History:  Diagnosis Date  . Allergy   . Anemia   . Arthritis   . Cardiomyopathy (HCC)   . CHF (congestive heart failure) (HCC)   . GERD (gastroesophageal reflux disease)   . Hyperlipidemia   . Hypertension   . Overactive bladder     Past Surgical History:  Procedure Laterality Date  . COLONOSCOPY    . KNEE ARTHROSCOPY     2007  . ROTATOR CUFF REPAIR Right 03/23/13  . TOTAL KNEE ARTHROPLASTY  09/22/2011   Procedure: TOTAL KNEE ARTHROPLASTY;  Surgeon: Raymon MuttonStephen D Lucey, MD;  Location: [facility] OR;  Service: Orthopedics;  Laterality: Right;    Social History   Social History  . Marital status: Married    Spouse name: N/A  . Number of children: 2  . Years of education: N/A   Occupational History  .  [employer]   Social History Main Topics  . Smoking status: Never Smoker  . Smokeless tobacco: Never Used  . Alcohol use 1.2 oz/week    2 Glasses of wine per week     Comment: None at present. Usually has 2 glasses wine daily  . Drug use: No  . Sexual activity: Not on file   Other Topics Concern  . 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 Cancer Mother     colon 5460 and breast 5054- deceased  . Colon cancer Mother   . Diabetes Father     living  . Asthma Father   . Hypertension Father   . Heart attack Father 7479    medical management per pt  . Glaucoma Father     had eye transplant  . Kidney failure Father     acute renal failure- died 5892  . Esophageal cancer Neg Hx   . Stomach cancer Neg Hx   . Rectal cancer Neg Hx     VITAL SIGNS LMP 06/17/2007   Patient's Medications  New Prescriptions   No medications on file  Previous Medications   ALBUTEROL (PROVENTIL HFA;VENTOLIN HFA) 108 (90 BASE) MCG/ACT INHALER    Inhale 2 puffs into the lungs every 6 (six) hours as needed for wheezing or shortness of breath.   CALCIUM-VITAMIN D (OSCAL WITH D) 500-200 MG-UNIT PER TABLET    Take 1 tablet by mouth daily.   DOCUSATE SODIUM (COLACE) 100 MG CAPSULE    Take 1 capsule (100 mg total) by mouth every 12 (twelve) hours.   LOSARTAN (  COZAAR) 50 MG TABLET    Take 1 tablet (50 mg total) by mouth daily.   MELATONIN 3 MG TABS    Take 1 tablet by mouth at bedtime.   MELOXICAM (MOBIC) 15 MG TABLET    Take 15 mg by mouth daily.   METOPROLOL SUCCINATE (TOPROL-XL) 50 MG 24 HR TABLET    TAKE 1 TABLET BY MOUTH EVERY DAY   OXYCODONE (OXY IR/ROXICODONE) 5 MG IMMEDIATE RELEASE TABLET    Take 5 mg by mouth every 6 (six) hours as needed for severe pain.   TIZANIDINE (ZANAFLEX) 4 MG CAPSULE    Take 4 mg by mouth 3 (three) times daily as needed.    TORSEMIDE (DEMADEX) 10 MG TABLET    Take 10 mg by mouth daily.   Modified Medications   No medications on file  Discontinued Medications   No medications on file     SIGNIFICANT DIAGNOSTIC EXAMS  01-04-16: 2-d echo: Left ventricle: The cavity size was mildly dilated. Wall thickness was increased in a pattern of mild LVH. Systolic function was mildly reduced. The estimated ejection fraction was in the range of 45% to 50%. Mild diffuse hypokinesis with no identifiable regional variations. Features  are consistent with a pseudonormal left ventricular filling pattern, with concomitant abnormal relaxation and increased filling pressure (grade 2 diastolic dysfunction). - Mitral valve: There was mild regurgitation. - Left atrium: The atrium was moderately dilated. - Pulmonary arteries: Systolic pressure was mildly increased. PA peak pressure: 42 mm Hg (S).   LABS REVIEWED:   02-13-16: wbc 4.4; hgb 12.3; hct 37.7; mcv 83.6; plt 211; glucose 121; bun 32.3; creat 1.22; k+ 4.1; na++ 141    Review of Systems  Constitutional: Negative for malaise/fatigue.  Respiratory: Negative for cough and shortness of breath.   Cardiovascular: Negative for chest pain, palpitations and leg swelling.  Gastrointestinal: Negative for abdominal pain, constipation and heartburn.  Musculoskeletal: Positive for joint pain and myalgias. Negative for back pain.       Has left thigh and hip pain   Skin: Negative.   Neurological: Negative for dizziness.  Psychiatric/Behavioral: The patient is not nervous/anxious.     Physical Exam  Constitutional: No distress.  Morbid obesity   Eyes: Conjunctivae are normal.  Neck: Neck supple. No JVD present. No thyromegaly present.  Cardiovascular: Normal rate, regular rhythm and intact distal pulses.   Respiratory: Effort normal and breath sounds normal. No respiratory distress. She has no wheezes.  GI: Soft. Bowel sounds are normal. She exhibits no distension. There is no tenderness.  Musculoskeletal: She exhibits no edema.  Able to move all extremities  Has lumbar spine tenderness on left side   Lymphadenopathy:    She has no cervical adenopathy.  Neurological: She is alert.  Skin: Skin is warm and dry. She is not diaphoretic.  Psychiatric: She has a normal mood and affect.       ASSESSMENT/ PLAN:  Patient is being discharged with the following home health services:  Pt/ot/rn: to evaluate and treat as indicated for gait balance strength adl training medication  management   Patient is being discharged with the following durable medical equipment:  Standard bariatric wheelchair with cushion; antitippers; with elevating swing away legs; brack extensions in order to allow her to maintain her current level of independence with her ald's which cannot be achieved with a walker; she can self propel. She will need bariatric 3:1 commode with solid surface with drop arm; will need a slide board for transfers  Patient has been advised to f/u with their PCP in 1-2 weeks to bring them up to date on their rehab stay.  Social services at facility was responsible for arranging this appointment.  Pt was provided with a 30 day supply of prescriptions for medications and refills must be obtained from their PCP.  For controlled substances, a more limited supply may be provided adequate until PCP appointment only. #10 oxycodone 5 mg tabs.    Time spent with patient  45   minutes >50% time spent counseling; reviewing medical record; tests; labs; and developing future plan of care    Synthia Innocent NP [facility]  Contact [phone] Monday through Friday 8am- 5pm  After hours call [phone]

## 2016-03-19 ENCOUNTER — Ambulatory Visit: Payer: BLUE CROSS/BLUE SHIELD | Admitting: Family

## 2016-03-31 ENCOUNTER — Encounter: Payer: Self-pay | Admitting: Neurology

## 2016-03-31 ENCOUNTER — Ambulatory Visit (INDEPENDENT_AMBULATORY_CARE_PROVIDER_SITE_OTHER): Payer: BLUE CROSS/BLUE SHIELD | Admitting: Neurology

## 2016-03-31 VITALS — BP 131/75 | HR 58

## 2016-03-31 DIAGNOSIS — R202 Paresthesia of skin: Secondary | ICD-10-CM | POA: Diagnosis not present

## 2016-03-31 DIAGNOSIS — G8929 Other chronic pain: Secondary | ICD-10-CM

## 2016-03-31 DIAGNOSIS — M5440 Lumbago with sciatica, unspecified side: Secondary | ICD-10-CM | POA: Diagnosis not present

## 2016-03-31 DIAGNOSIS — S82001D Unspecified fracture of right patella, subsequent encounter for closed fracture with routine healing: Secondary | ICD-10-CM

## 2016-03-31 MED ORDER — GABAPENTIN 300 MG PO CAPS: 300.0000 mg | ORAL_CAPSULE | Freq: Three times a day (TID) | ORAL | 11 refills | Status: DC

## 2016-03-31 NOTE — Progress Notes (Signed)
PATIENT: Briana Miller DOB: Apr 22, 1956  Chief Complaint  Patient presents with  . Leg Pain    She is here with her husband, Briana Miller.  Two months ago, she developed intermittent sharp, shooting pains down her  left leg from her outer thigh to her knee.  More recently, she has also noticed pain down the back of her right thigh.  She denies any injury to the areas.  Symptoms are causing her gait difficulty and she has been relying on a wheelchair.     HISTORICAL  Briana Miller is a 60 years old left-handed female, accompanied by her husband Briana Miller, seen in refer by  her primary care nurse practitioner Sandford Craze for evaluation of bilateral lower extremity pain, initial evaluation was March 31 2016.  She fell broke her right patella on July 29th 2017, she had a history of right knee replacement in 2013, She stayed in rehabilitation for couple weeks following her right patella fracture, she still has significant right knee pain, is receiving physical therapy at home, she had a history of hypertension, hyperlipidemia, congestive heart failure, obesity, is a candidate for left knee replacement surgery. She had significant gait abnormality because continued bilateral knee pain  She also developed radiating pain from lower back to her left lateral thigh shins she fell in July 2017, sometimes radiating to right posterior leg, she denies bowel and bladder incontinence, she denies bilateral feet paresthesia, she denies weakness.   echocardiogram on January 04 2016, showed graft thickness was increased in a pattern consistent with mild left ventricular hypertrophy, cavity size was mildly dilated, systolic function was in the range 45-50%, mild diffuse hypokinesia with no identifiable fireball regional variation,  Laboratory evaluation in September 2017, elevated creatinine 1.3, A1c 6.4, normal CBC, negative troponin, TSH, liver functional tests, was normal in October 2016, negative hepatitis C,  HIV,  REVIEW OF SYSTEMS: Full 14 system review of systems performed and notable only for as above  ALLERGIES: No Known Allergies  HOME MEDICATIONS: Current Outpatient Prescriptions  Medication Sig Dispense Refill  . albuterol (PROVENTIL HFA;VENTOLIN HFA) 108 (90 BASE) MCG/ACT inhaler Inhale 2 puffs into the lungs every 6 (six) hours as needed for wheezing or shortness of breath. 1 Inhaler 0  . calcium-vitamin D (OSCAL WITH D) 500-200 MG-UNIT per tablet Take 1 tablet by mouth daily.    Marland Kitchen docusate sodium (COLACE) 100 MG capsule Take 1 capsule (100 mg total) by mouth every 12 (twelve) hours. 60 capsule 0  . losartan (COZAAR) 50 MG tablet Take 1 tablet (50 mg total) by mouth daily. 30 tablet 3  . meloxicam (MOBIC) 15 MG tablet Take 15 mg by mouth daily.    . metoprolol succinate (TOPROL-XL) 50 MG 24 hr tablet TAKE 1 TABLET BY MOUTH EVERY DAY 90 tablet 1  . tiZANidine (ZANAFLEX) 4 MG capsule Take 4 mg by mouth 3 (three) times daily as needed.     . torsemide (DEMADEX) 10 MG tablet Take 10 mg by mouth daily.      No current facility-administered medications for this visit.     PAST MEDICAL HISTORY: Past Medical History:  Diagnosis Date  . Acute systolic congestive heart failure (HCC) 03/01/2014  . Allergy   . Anemia   . Arthritis   . Cardiomyopathy (HCC)   . CHF (congestive heart failure) (HCC)   . GERD (gastroesophageal reflux disease)   . Heart disease   . Hyperlipidemia   . Hypertension   . Leg pain   .  Overactive bladder     PAST SURGICAL HISTORY: Past Surgical History:  Procedure Laterality Date  . COLONOSCOPY    . KNEE ARTHROSCOPY     2007  . ROTATOR CUFF REPAIR Right 03/23/13  . TOTAL KNEE ARTHROPLASTY  09/22/2011   Procedure: TOTAL KNEE ARTHROPLASTY;  Surgeon: Raymon Mutton, MD;  Location: [facility] OR;  Service: Orthopedics;  Laterality: Right;    FAMILY HISTORY: Family History  Problem Relation Age of Onset  . Cancer Mother     colon 61 and breast 31- deceased  .  Colon cancer Mother   . Diabetes Father     living  . Asthma Father   . Hypertension Father   . Heart attack Father 91    medical management per pt  . Glaucoma Father     had eye transplant  . Kidney failure Father     acute renal failure- died 83  . Esophageal cancer Neg Hx   . Stomach cancer Neg Hx   . Rectal cancer Neg Hx     SOCIAL HISTORY:  Social History   Social History  . Marital status: Married    Spouse name: N/A  . Number of children: 2  . Years of education: HS   Occupational History  . [employer]   Social History Main Topics  . Smoking status: Never Smoker  . Smokeless tobacco: Never Used  . Alcohol use 1.2 oz/week    2 Glasses of wine per week  . Drug use: No  . Sexual activity: Not on file   Other Topics Concern  . 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   1-2 cups caffeine daily.   Lives at home with her husband.        PHYSICAL EXAM   Vitals:   03/31/16 1107  BP: 131/75  Pulse: (!) 58    Not recorded      There is no height or weight on file to calculate BMI.  PHYSICAL EXAMNIATION:  Gen: NAD, conversant, well nourised, obese, well groomed                     Cardiovascular: Regular rate rhythm, no peripheral edema, warm, nontender. Eyes: Conjunctivae clear without exudates or hemorrhage Neck: Supple, no carotid bruise. Pulmonary: Clear to auscultation bilaterally   NEUROLOGICAL EXAM:  MENTAL STATUS: Speech:    Speech is normal; fluent and spontaneous with normal comprehension.  Cognition:     Orientation to time, place and person     Normal recent and remote memory     Normal Attention span and concentration     Normal Language, naming,  repeating,spontaneous speech     Fund of knowledge   CRANIAL NERVES: CN II: Visual fields are full to confrontation. Fundoscopic exam is normal with sharp discs and no vascular changes. Pupils are round equal and briskly reactive to light. CN III, IV, VI: extraocular movement are normal. No ptosis. CN V: Facial sensation is intact to pinprick in all 3 divisions bilaterally. Corneal responses are intact.  CN VII: Face is symmetric with normal eye closure and smile. CN VIII: Hearing is normal to rubbing fingers CN  IX, X: Palate elevates symmetrically. Phonation is normal. CN XI: Head turning and shoulder shrug are intact CN XII: Tongue is midline with normal movements and no atrophy.  MOTOR: There is no pronator drift of out-stretched arms. Muscle bulk and tone are normal. Muscle strength is normal.  REFLEXES: Reflexes are 2+ and symmetric at the biceps, triceps, knees, and ankles. Plantar responses are flexor.  SENSORY: Intact to light touch, pinprick, positional sensation and vibratory sensation are intact in fingers and toes.  COORDINATION: Rapid alternating movements and fine finger movements are intact. There is no dysmetria on finger-to-nose and heel-knee-shin.    GAIT/STANCE: She sits in wheelchair, need assistant to get up from seated position, antalgic very unsteady gait   DIAGNOSTIC DATA (LABS, IMAGING, TESTING) - I reviewed patient records, labs, notes, testing and imaging myself where available.   ASSESSMENT AND PLAN  Wyatt PortelaBrenda Winrow is a 60 y.o. female   Low back pain, radiating pain to bilateral lower extremity Need to rule out lumbar radiculopathy,  Left lateral thigh paresthesia There was also a possibility of left meralgia paresthetica Add on gabapentin 300 mg 3 times a day   Levert FeinsteinYijun Dierdra Salameh, M.D. Ph.D.  [facility] [address], [address] [place], [place] [address] Ph: [phone] Fax: [phone]  NG:[id number]:[name] Peggyann Juba'Sullivan, NP

## 2016-04-01 ENCOUNTER — Encounter: Payer: Self-pay | Admitting: Family

## 2016-04-01 ENCOUNTER — Ambulatory Visit (INDEPENDENT_AMBULATORY_CARE_PROVIDER_SITE_OTHER): Payer: BLUE CROSS/BLUE SHIELD | Admitting: Family

## 2016-04-01 ENCOUNTER — Telehealth: Payer: Self-pay | Admitting: Family

## 2016-04-01 VITALS — HR 83 | Temp 98.0°F | Resp 16 | Ht 66.0 in

## 2016-04-01 DIAGNOSIS — Z23 Encounter for immunization: Secondary | ICD-10-CM

## 2016-04-01 DIAGNOSIS — M5440 Lumbago with sciatica, unspecified side: Secondary | ICD-10-CM | POA: Diagnosis not present

## 2016-04-01 DIAGNOSIS — I5032 Chronic diastolic (congestive) heart failure: Secondary | ICD-10-CM

## 2016-04-01 DIAGNOSIS — I10 Essential (primary) hypertension: Secondary | ICD-10-CM

## 2016-04-01 DIAGNOSIS — G8929 Other chronic pain: Secondary | ICD-10-CM

## 2016-04-01 DIAGNOSIS — S82001D Unspecified fracture of right patella, subsequent encounter for closed fracture with routine healing: Secondary | ICD-10-CM | POA: Diagnosis not present

## 2016-04-01 MED ORDER — DICLOFENAC SODIUM 1 % TD GEL: 4.0000 g | Freq: Two times a day (BID) | TRANSDERMAL | 2 refills | Status: DC

## 2016-04-01 NOTE — Assessment & Plan Note (Signed)
Clinically compensated.  Management per cardiology.   

## 2016-04-01 NOTE — Assessment & Plan Note (Signed)
BP stable on current medications.  

## 2016-04-01 NOTE — Telephone Encounter (Signed)
Relation to OQ:HUTM Call back [phone]   Reason for call:  Patient requesting home health aid to assist with dressing, showering and cooking. Please advise

## 2016-04-01 NOTE — Assessment & Plan Note (Signed)
Work up ongoing per neurology.  Defer management to neurology.

## 2016-04-01 NOTE — Progress Notes (Signed)
Pre visit review using our clinic review tool, if applicable. No additional management support is needed unless otherwise documented below in the visit note. 

## 2016-04-01 NOTE — Progress Notes (Signed)
Subjective:    Patient ID: Briana Miller, female    DOB: Dec 06, 1955, 60 y.o.   MRN: [id number]  HPI  Briana Miller is a 60 yr old female who presents today for follow up.  1) HTN- maintained on metoprolol.   BP Readings from Last 3 Encounters:  03/31/16 131/75  03/13/16 136/88  03/05/16 122/84   2) Congestive Heart Failure-  She is followed by Dr. Jens Somrenshaw.  Maintained on demadex.  3) Knee pain- Fell on her knee back in July.  She has not seen him in 2 months.  She has been doing PT for 3 weeks. She is following with Dr. Valentina GuLucy.    4) Low back pain with radiculopathy- She reports shooting pain down her left leg x 2 months.  She is maintained on gabapentin per neurology. She is following with Dr. Terrace ArabiaYan who reportedly has schedule EMG studies to further evaluate.   Review of Systems See HPI  Past Medical History:  Diagnosis Date  . Acute systolic congestive heart failure (HCC) 03/01/2014  . Allergy   . Anemia   . Arthritis   . Cardiomyopathy (HCC)   . CHF (congestive heart failure) (HCC)   . GERD (gastroesophageal reflux disease)   . Heart disease   . Hyperlipidemia   . Hypertension   . Leg pain   . Overactive bladder      Social History   Social History  . Marital status: Married    Spouse name: N/A  . Number of children: 2  . Years of education: HS   Occupational History  . Assembly [employer]   Social History Main Topics  . Smoking status: Never Smoker  . Smokeless tobacco: Never Used  . Alcohol use 1.2 oz/week    2 Glasses of wine per week  . Drug use: No  . Sexual activity: Not on file   Other Topics Concern  . 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   1-2 cups caffeine daily.   Lives at home with her husband.       Past Surgical History:  Procedure Laterality Date  . COLONOSCOPY    . KNEE ARTHROSCOPY     2007  . ROTATOR CUFF REPAIR Right 03/23/13  . TOTAL KNEE ARTHROPLASTY  09/22/2011   Procedure: TOTAL KNEE ARTHROPLASTY;  Surgeon: Raymon MuttonStephen D Lucey, MD;  Location: [facility] OR;  Service: Orthopedics;  Laterality: Right;    Family History  Problem Relation Age of Onset  . Cancer Mother     colon 4460 and breast 2454- deceased  . Colon cancer Mother   . Diabetes Father     living  . Asthma Father   . Hypertension Father   . Heart attack Father 179    medical management per pt  . Glaucoma Father     had eye transplant  . Kidney failure Father     acute renal failure- died 4992  . Esophageal cancer Neg Hx   . Stomach cancer Neg Hx   . Rectal cancer Neg Hx     No Known Allergies  Current Outpatient Prescriptions on File Prior to Visit  Medication Sig Dispense Refill  . albuterol (  PROVENTIL HFA;VENTOLIN HFA) 108 (90 BASE) MCG/ACT inhaler Inhale 2 puffs into the lungs every 6 (six) hours as needed for wheezing or shortness of breath. 1 Inhaler 0  . calcium-vitamin D (OSCAL WITH D) 500-200 MG-UNIT per tablet Take 1 tablet by mouth daily.    Marland Kitchen docusate sodium (COLACE) 100 MG capsule Take 1 capsule (100 mg total) by mouth every 12 (twelve) hours. 60 capsule 0  . gabapentin (NEURONTIN) 300 MG capsule Take 1 capsule (300 mg total) by mouth 3 (three) times daily. 90 capsule 11  . losartan (COZAAR) 50 MG tablet Take 1 tablet (50 mg total) by mouth daily. 30 tablet 3  . meloxicam (MOBIC) 15 MG tablet Take 15 mg by mouth daily.    . metoprolol succinate (TOPROL-XL) 50 MG 24 hr tablet TAKE 1 TABLET BY MOUTH EVERY DAY 90 tablet 1  . tiZANidine (ZANAFLEX) 4 MG capsule Take 4 mg by mouth 3 (three) times daily as needed.     . torsemide (DEMADEX) 10 MG tablet Take 10 mg by mouth daily.      No current facility-administered medications on file prior to visit.     Pulse 83    Temp 98 F (36.7 C) (Oral)   Resp 16   Ht 5\' 6"  (1.676 m)   LMP 06/17/2007   SpO2 99% Comment: room air      Objective:   Physical Exam  Constitutional: She is oriented to person, place, and time. She appears well-developed and well-nourished.  Cardiovascular: Normal rate, regular rhythm and normal heart sounds.   No murmur heard. Pulmonary/Chest: Effort normal and breath sounds normal. No respiratory distress. She has no wheezes.  Musculoskeletal: She exhibits no edema.  Neurological: She is alert and oriented to person, place, and time.  Psychiatric: She has a normal mood and affect. Her behavior is normal. Judgment and thought content normal.          Assessment & Plan:  Flu shot today

## 2016-04-01 NOTE — Patient Instructions (Addendum)
You may apply voltaren gel to your right knee twice daily as needed. Please follow up with Dr. Valentina Gu and Dr. Terrace Arabia as scheduled.

## 2016-04-01 NOTE — Assessment & Plan Note (Signed)
Advised pt to follow up with ortho to discuss pain medications. In the meantime, I will give her an rx for voltaren gel.

## 2016-04-02 NOTE — Telephone Encounter (Signed)
Spoke with pt and she will check with her insurance and let us know if she wants to proceed.

## 2016-04-02 NOTE — Telephone Encounter (Signed)
I do not believe that [facility] will pay for a home health aid.  Typically they only pay for skilled nursing services and physical therapy.  I would recommend that she contact [employer] and double check with them re: coverage for aid. If it is covered we can order for her.

## 2016-04-04 ENCOUNTER — Telehealth: Payer: Self-pay | Admitting: Neurology

## 2016-04-04 NOTE — Telephone Encounter (Signed)
Have talked with patient, she is able to tolerate gabapentin 300 mg 3 times a day, no significant side effect, she still has frequent left lower extremity shooting pain, I have advised her to increase gabapentin to 300 mg 2 tablets 3 times a day   Please check on her MRI lumbar schedule, make sure she can have it soon, EMG nerve conduction study is scheduled in December

## 2016-04-04 NOTE — Telephone Encounter (Signed)
Pt called to advise she has been taking gabapentin (NEURONTIN) 300 MG capsule as directed since Monday. She said she is not getting any relief for pain. Please call

## 2016-04-10 ENCOUNTER — Telehealth: Payer: Self-pay | Admitting: Neurology

## 2016-04-10 MED ORDER — DULOXETINE HCL 60 MG PO CPEP: 60.0000 mg | ORAL_CAPSULE | Freq: Every day | ORAL | 11 refills | Status: DC

## 2016-04-10 NOTE — Telephone Encounter (Signed)
Patient called to schedule MRI. Patient adds, increasing gabapentin (NEURONTIN) 300 MG capsule from 1 pill 3 x day to 2 pills 3 x day "those pills don't work, it's not helping the back pain and shooting pain going down leg".

## 2016-04-10 NOTE — Telephone Encounter (Signed)
Please call her, keep gabapentin, add on cymbalta 60mg  every morning

## 2016-04-10 NOTE — Telephone Encounter (Signed)
Spoke to [place] - she is agreeable to add the new medication and verbalized understanding of instructions.

## 2016-04-15 NOTE — Telephone Encounter (Signed)
Pt returned Danielle's call °

## 2016-04-15 NOTE — Telephone Encounter (Signed)
Called patient to schedule MRI she did not answer so I left a VM asking for her to call back.

## 2016-04-16 ENCOUNTER — Ambulatory Visit: Payer: BLUE CROSS/BLUE SHIELD | Admitting: Family

## 2016-04-16 ENCOUNTER — Telehealth: Payer: Self-pay | Admitting: Family

## 2016-04-16 NOTE — Telephone Encounter (Signed)
Spoke with patient to schedule [facility]. She stated that she could not come on Wednesdays and that she want to have it at [facility]. I will fax the order to [facility] for her to have her MRI there.

## 2016-04-16 NOTE — Telephone Encounter (Signed)
Spoke with patient to schedule her [facility] here at [facility]. She stated that she cannot do Wednesday's that she wants to have it at [facility]. I will fax the order to [facility] for her to have her MRI there.

## 2016-04-16 NOTE — Telephone Encounter (Signed)
Pt came in office for her appt today 04-16-16 (10:45) Pt came in late at 11:15 when pt appt was at 10:45, PCP was asked about seeing pt and ([name] o'Sullivan) offered to see her if her pt at 11:15 didn't show up but PCP pt did show up and there was no other provider available at our office to see her, so pt was offered to have someone to see her at [facility] and pt denied, pt also was offered to be Triage by a nurse but pt stated had to go since her daughter was the driver, she had to go to another appt at another office with her daughter (grand kids appt).

## 2016-04-18 ENCOUNTER — Other Ambulatory Visit: Payer: Self-pay | Admitting: Family

## 2016-04-18 MED ORDER — GABAPENTIN 300 MG PO CAPS: 600.0000 mg | ORAL_CAPSULE | Freq: Three times a day (TID) | ORAL | 11 refills | Status: DC

## 2016-04-18 NOTE — Addendum Note (Signed)
Addended by: Levert Feinstein on: 04/18/2016 10:51 AM   Modules accepted: Orders

## 2016-04-18 NOTE — Telephone Encounter (Signed)
I have spoken with Briana Miller and advised YY has escribed new rx. to pharmacy[facility]

## 2016-04-18 NOTE — Telephone Encounter (Signed)
Faith:  Please call patient that I have update the gabapentin order to 300 mg 2 tablets 3 times a day

## 2016-04-18 NOTE — Telephone Encounter (Signed)
Pt called in [facility] to show change in dosage of Gabapentin. Please call and give update.

## 2016-04-18 NOTE — Telephone Encounter (Signed)
Last seen 04/01/16 Rx not filled by you  Please advise PC

## 2016-04-21 ENCOUNTER — Encounter: Payer: Self-pay | Admitting: Family

## 2016-04-21 ENCOUNTER — Ambulatory Visit (INDEPENDENT_AMBULATORY_CARE_PROVIDER_SITE_OTHER): Payer: BLUE CROSS/BLUE SHIELD | Admitting: Family

## 2016-04-21 ENCOUNTER — Ambulatory Visit (HOSPITAL_BASED_OUTPATIENT_CLINIC_OR_DEPARTMENT_OTHER)
Admission: RE | Admit: 2016-04-21 | Discharge: 2016-04-21 | Disposition: A | Discharge status: Home / Self Care | Payer: BLUE CROSS/BLUE SHIELD | Source: Ambulatory Visit | Attending: Family | Admitting: Family

## 2016-04-21 VITALS — BP 150/90 | HR 73 | Temp 98.2°F | Resp 16 | Ht 66.0 in | Wt 262.0 lb

## 2016-04-21 DIAGNOSIS — M79606 Pain in leg, unspecified: Secondary | ICD-10-CM

## 2016-04-21 DIAGNOSIS — M7989 Other specified soft tissue disorders: Secondary | ICD-10-CM | POA: Insufficient documentation

## 2016-04-21 DIAGNOSIS — M79605 Pain in left leg: Secondary | ICD-10-CM | POA: Diagnosis present

## 2016-04-21 NOTE — Progress Notes (Signed)
Pre visit review using our clinic review tool, if applicable. No additional management support is needed unless otherwise documented below in the visit note. 

## 2016-04-21 NOTE — Progress Notes (Signed)
Subjective:    Patient ID: Briana Miller, female    DOB: January 31, 1956, 60 y.o.   MRN: [id number]  HPI  Briana Miller is a 60 yr old female who presents today to discuss a painful "knot" on her left thigh which has been present x 2 weeks as well as a knot on the right lower leg which has also been present for a few weeks.   BP Readings from Last 3 Encounters:  04/21/16 (!) 146/94  03/31/16 131/75  03/13/16 136/88    Review of Systems See HPI  Past Medical History:  Diagnosis Date  . Acute systolic congestive heart failure (HCC) 03/01/2014  . Allergy   . Anemia   . Arthritis   . Cardiomyopathy (HCC)   . CHF (congestive heart failure) (HCC)   . GERD (gastroesophageal reflux disease)   . Heart disease   . Hyperlipidemia   . Hypertension   . Leg pain   . Overactive bladder      Social History   Social History  . Marital status: Married    Spouse name: N/A  . Number of children: 2  . Years of education: HS   Occupational History  . [employer]   Social History Main Topics  . Smoking status: Never Smoker  . Smokeless tobacco: Never Used  . Alcohol use 1.2 oz/week    2 Glasses of wine per week  . Drug use: No  . Sexual activity: Not on file   Other Topics Concern  . 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   1-2 cups caffeine daily.   Lives at home with her husband.       Past Surgical History:  Procedure Laterality Date  . COLONOSCOPY    . KNEE ARTHROSCOPY     2007  . ROTATOR CUFF REPAIR Right 03/23/13  . TOTAL KNEE ARTHROPLASTY  09/22/2011   Procedure: TOTAL KNEE ARTHROPLASTY;  Surgeon: Raymon Mutton, MD;  Location: [facility] OR;  Service: Orthopedics;  Laterality: Right;    Family  History  Problem Relation Age of Onset  . Cancer Mother     colon 78 and breast 60- deceased  . Colon cancer Mother   . Diabetes Father     living  . Asthma Father   . Hypertension Father   . Heart attack Father 57    medical management per pt  . Glaucoma Father     had eye transplant  . Kidney failure Father     acute renal failure- died 17  . Esophageal cancer Neg Hx   . Stomach cancer Neg Hx   . Rectal cancer Neg Hx     No Known Allergies  Current Outpatient Prescriptions on File Prior to Visit  Medication Sig Dispense Refill  . albuterol (PROVENTIL HFA;VENTOLIN HFA) 108 (90 BASE) MCG/ACT inhaler Inhale 2 puffs into the lungs every 6 (six) hours as needed for wheezing or shortness of breath. 1 Inhaler 0  . calcium-vitamin D (OSCAL WITH D) 500-200 MG-UNIT per tablet Take 1 tablet by mouth daily.    . diclofenac sodium (VOLTAREN) 1 % GEL Apply 4 g topically 2 (two) times daily. To right knee  100 g 2  . docusate sodium (COLACE) 100 MG capsule Take 1 capsule (100 mg total) by mouth every 12 (twelve) hours. 60 capsule 0  . DULoxetine (CYMBALTA) 60 MG capsule Take 1 capsule (60 mg total) by mouth daily. 30 capsule 11  . gabapentin (NEURONTIN) 300 MG capsule Take 2 capsules (600 mg total) by mouth 3 (three) times daily. 180 capsule 11  . losartan (COZAAR) 50 MG tablet Take 1 tablet (50 mg total) by mouth daily. 30 tablet 3  . meloxicam (MOBIC) 15 MG tablet Take 15 mg by mouth daily.    . metoprolol succinate (TOPROL-XL) 50 MG 24 hr tablet TAKE 1 TABLET BY MOUTH EVERY DAY 90 tablet 1  . tiZANidine (ZANAFLEX) 4 MG capsule Take 4 mg by mouth 3 (three) times daily as needed.     . torsemide (DEMADEX) 10 MG tablet Take 10 mg by mouth daily.      No current facility-administered medications on file prior to visit.     BP (!) 146/94 (BP Location: Left Arm, Patient Position: Sitting, Cuff Size: Large)   Pulse 73   Temp 98.2 F (36.8 C) (Oral)   Resp 16   Ht 5\' 6"  (1.676 m)   Wt 262  lb (118.8 kg)   LMP 06/17/2007   SpO2 98% Comment: RA  BMI 42.29 kg/m       Objective:   Physical Exam  Constitutional: She appears well-developed and well-nourished.  Cardiovascular: Normal rate, regular rhythm and normal heart sounds.   No murmur heard. Pulmonary/Chest: Effort normal and breath sounds normal. No respiratory distress. She has no wheezes.  Musculoskeletal:  Mild tenderness to palpation of the left upper/inner thigh. No mass/abscess, erythema noted.   RLE- some hyperpigmentation noted RLE, some induration of tissues right lower calf area.  Non-tender, no erythema, no warmth.   Psychiatric: She has a normal mood and affect. Her behavior is normal. Judgment and thought content normal.          Assessment & Plan:  Bilateral LE swelling, LLE leg pain- will obtain bilateral LE doppler to rule out DVT. Advised pt that if neg for DVT, to monitor the 2 areas of concern and let me know if new/worsening symptoms.  Pt verbalizes understanding.   Addendum:  US neg for DVT.

## 2016-04-21 NOTE — Patient Instructions (Signed)
Please complete ultrasound on the first floor. Call if increased pain, swelling or if you develop fever.

## 2016-04-22 ENCOUNTER — Telehealth: Payer: Self-pay | Admitting: Family

## 2016-04-22 NOTE — Telephone Encounter (Signed)
Is she taking meloxicam?  I would recommend that she try adding tylenol as well on an as needed basis.  Let me know if symptoms worsen or do not improve.

## 2016-04-22 NOTE — Telephone Encounter (Signed)
°  Relation to PH:XTAV Call back number:[phone]   Reason for call:  Patient requesting pain medication for her thigh pain, please advise

## 2016-04-22 NOTE — Telephone Encounter (Signed)
Yes please

## 2016-04-22 NOTE — Telephone Encounter (Signed)
Pelena with [facility] is calling to get verbal orders for patient to continue OT 2x per week for 3 weeks. Please advise.    Pelena Contact: [phone]

## 2016-04-22 NOTE — Telephone Encounter (Signed)
Called and spoke with Pelena. Informed Pelena that per Sandford Craze, NP it is ok to continue OT 2x per week for 3 weeks.

## 2016-04-23 NOTE — Telephone Encounter (Signed)
Patient is calling to follow up on this. Please advise.   She also has a few questions regarding her ultrasound. Please advise.

## 2016-04-23 NOTE — Telephone Encounter (Signed)
Spoke with pt and notified her of below recommendation. States she did not take Meloxicam because her other doctor told her it wasn't anything more than ibuprofen. Pt states she will try the meloxicam and let us know if pain does not improve.

## 2016-04-24 ENCOUNTER — Telehealth: Payer: Self-pay | Admitting: Family

## 2016-04-24 DIAGNOSIS — M25569 Pain in unspecified knee: Secondary | ICD-10-CM

## 2016-04-24 NOTE — Telephone Encounter (Signed)
Patient was doing PT with home health and her session has ended and they are discharging her. They are recommending doing outpatient PT and patient would like it to be with [facility] in [place]. Please advise.  [facility] phone: [phone] (fax)    Home health contact: [phone]

## 2016-04-25 NOTE — Telephone Encounter (Signed)
Ok to place referral in PCP absence.

## 2016-04-25 NOTE — Telephone Encounter (Signed)
Okay to refer? 

## 2016-04-25 NOTE — Telephone Encounter (Signed)
Referral placed as directed. 

## 2016-05-04 ENCOUNTER — Other Ambulatory Visit: Payer: Self-pay | Admitting: Family

## 2016-05-05 NOTE — Telephone Encounter (Signed)
[facility]am 15mg  & Tizanidine 4 mg Last filled - Only find Historical medication entry in EMR [no Hx of px] Last AEX - 04/21/16  Please Advise on refills/SLS 11/20

## 2016-05-06 ENCOUNTER — Telehealth: Payer: Self-pay | Admitting: Family

## 2016-05-06 NOTE — Telephone Encounter (Signed)
Form received and forwarded to PCP.

## 2016-05-06 NOTE — Telephone Encounter (Signed)
Pt's daughter dropped off short term disability form for Briana Miller to complete, pt would like forms faxed and also would like a copy for her records, documents were placed in tray at front office

## 2016-05-06 NOTE — Telephone Encounter (Signed)
Pt called in to follow up, advised pt of providers response back. Pt expressed understanding.

## 2016-05-07 NOTE — Telephone Encounter (Signed)
Advised pt that PCP is out of the office for the rest of the week and it will be Monday before form can be addressed. She states that form is due by 4pm Monday. I did advise her to let disability group know there could be a delay with the form but we will do our best to have it submitted by then.

## 2016-05-07 NOTE — Telephone Encounter (Signed)
Patient checking on the status of form below, patient states deadline is Monday and would like a call when forms are faxed to number listed on form. Best # [phone]

## 2016-05-12 NOTE — Telephone Encounter (Signed)
Form signed and placed on your desk 

## 2016-05-12 NOTE — Telephone Encounter (Signed)
Form faxed to [facility] at [phone]. Notified pt.

## 2016-05-15 NOTE — Telephone Encounter (Signed)
Patient called stating that she would also like these forms faxed to job site. Please advise.    Fax: [phone]  "attention Valentina Gu"  Patient phone: [phone]

## 2016-05-16 NOTE — Telephone Encounter (Signed)
Form faxed to employer at below # and pt has been notified.

## 2016-05-21 ENCOUNTER — Telehealth: Payer: Self-pay | Admitting: Family

## 2016-05-21 NOTE — Telephone Encounter (Signed)
Pt's daughter dropped off document to be filled out by PCP ([employer] Short Term Disabitlity Claim Form and [facility] document). Please fax documents to [phone] and also to have them fax to [phone]. Document put at front office tray.

## 2016-05-26 ENCOUNTER — Ambulatory Visit: Payer: BLUE CROSS/BLUE SHIELD | Admitting: Family

## 2016-05-29 ENCOUNTER — Other Ambulatory Visit: Payer: Self-pay | Admitting: Adult Health

## 2016-05-30 ENCOUNTER — Encounter: Payer: BLUE CROSS/BLUE SHIELD | Admitting: Neurology

## 2016-06-08 ENCOUNTER — Other Ambulatory Visit: Payer: Self-pay | Admitting: Adult Health

## 2016-06-12 ENCOUNTER — Other Ambulatory Visit: Payer: Self-pay | Admitting: *Deleted

## 2016-06-12 MED ORDER — DULOXETINE HCL 60 MG PO CPEP: 60.0000 mg | ORAL_CAPSULE | Freq: Every day | ORAL | 3 refills | Status: DC

## 2016-06-18 ENCOUNTER — Other Ambulatory Visit: Payer: Self-pay | Admitting: Cardiology

## 2016-06-18 ENCOUNTER — Ambulatory Visit: Payer: BLUE CROSS/BLUE SHIELD | Admitting: Medical

## 2016-06-18 ENCOUNTER — Telehealth: Payer: Self-pay | Admitting: Family

## 2016-06-18 NOTE — Telephone Encounter (Signed)
No charge. 

## 2016-06-18 NOTE — Telephone Encounter (Signed)
Patient canceled her 2pm appointment today due to car not being able to start, charge or no charge

## 2016-07-01 ENCOUNTER — Telehealth: Payer: Self-pay | Admitting: Family

## 2016-07-01 NOTE — Telephone Encounter (Signed)
Pt's family dropped of paperwork to be filled out and fax to at [phone] and also to the fax [phone] and please let pt know when sent out. Document was put at front office tray. (Document [employer] health Plan)

## 2016-07-04 NOTE — Telephone Encounter (Signed)
Noted.  Form has been forwarded to PCP for review and signature.

## 2016-07-04 NOTE — Telephone Encounter (Signed)
Patient checking on the status of form mentioned below, informed patient office was closed Wednesday and Thursday due to the weather informed patient turnaround time is 5 to 7 business day.

## 2016-07-07 ENCOUNTER — Telehealth: Payer: Self-pay | Admitting: Family

## 2016-07-07 DIAGNOSIS — R2681 Unsteadiness on feet: Secondary | ICD-10-CM

## 2016-07-07 NOTE — Telephone Encounter (Signed)
Caller name: Relationship to patient: Self Can be reached: [phone] Pharmacy:  Reason for call: Patient request a new referral for PT. States that she needs to have PT in [facility] because her situation has changed.

## 2016-07-07 NOTE — Telephone Encounter (Signed)
Forms faxed. Fax confirmation received. Forms numbered and placed in scan bin for scanning.

## 2016-07-08 NOTE — Telephone Encounter (Signed)
Pt is requesting referral to PT in [place].

## 2016-07-09 NOTE — Telephone Encounter (Signed)
Referral faxed

## 2016-07-09 NOTE — Telephone Encounter (Signed)
Pt provided a fax # to location -- [phone]

## 2016-07-11 ENCOUNTER — Ambulatory Visit: Payer: BLUE CROSS/BLUE SHIELD | Admitting: Family

## 2016-07-14 ENCOUNTER — Encounter: Payer: Self-pay | Admitting: Family

## 2016-07-16 NOTE — Telephone Encounter (Signed)
Pt called in to make provider aware that she fell today and the 911 lift team came out to get her up off the floor. Pt says that she is okay and dont need to be seen.   Pt says that she currently go to [facility] at [facility].

## 2016-07-16 NOTE — Telephone Encounter (Signed)
Please advise    PC 

## 2016-07-16 NOTE — Telephone Encounter (Signed)
Patient called with the number for [employer] that will connect [name] with the person she need to speak to in order to get the patient set up with inpatient care at the rehab center  Phone: [phone]

## 2016-07-16 NOTE — Telephone Encounter (Signed)
Please ask pt what facility she is currently going to.  I don't believe that she is going to qualify for inpatient at this point.   We could arrange home health PT potentially.

## 2016-07-16 NOTE — Telephone Encounter (Addendum)
Relation to pt: self Call back number:[phone]  Reason for call:  Patient requesting a referral for "in patient PT" located at the facility where she was referred to in [facility]. Patient states they have a program whereas she would stay there for 3x out of the week while PT is being conducted, patient states she's tired of the wheelchair, please advise

## 2016-07-17 NOTE — Telephone Encounter (Signed)
Spoke with [facility], they are unable to admit patient. They only accept admits from acute care facilities. Insurance will only pay that way.

## 2016-07-17 NOTE — Telephone Encounter (Signed)
Briana Miller,  I am willing to refer her to inpatient rehab, but she is not admitted to the hospital. Would you please contact her insurance below and verify that this will be covered and what I need to do to complete the referral? Thanks!

## 2016-07-17 NOTE — Telephone Encounter (Signed)
Please advise    PC 

## 2016-07-18 NOTE — Telephone Encounter (Signed)
That is what I thought. Nicki Guadalajara, could you please explain that to the patient?

## 2016-07-18 NOTE — Telephone Encounter (Signed)
Notified pt. She is adamant that she spoke with [facility] Admissions and representative at [facility] and was told by both that she could be admitted to the [facility] below if her provider ordered it and obtained precert. Pt states she "started the precert process" when she talked to [facility] on the phone. Can you call the [employer] # in the notes below and see what you can find out. Pt was not accepting below outcome because of what she was told by insurance and admissions office at [facility].  Spoke with Pam, admissions co-ordinator at [facility] [phone]) and verified that pt cannot be accepted into their inpatient rehab unless they are coming from inpatient hospital or acute care (nursing home) setting. Pt is eligible for outpt rehab which she is already doing through [facility]. Notified pt and she voices understanding.

## 2016-07-21 ENCOUNTER — Telehealth: Payer: Self-pay | Admitting: Family

## 2016-07-21 NOTE — Telephone Encounter (Signed)
Caller name: Relationship to patient: Self Can be reached: [phone] Pharmacy:  Reason for call: Patient states that she needs to go back into an Assisted Living Facility because she still can not walk and is still using her wheelchair. States she is to weak to even get in and out of her wheelchair and has limited help at home. Needs provider to place an order for her to go back to assisted living.

## 2016-07-21 NOTE — Telephone Encounter (Signed)
Spoke with pt. She states she called [employer] at [phone] and was recommended to [facility]. Pt requests [place] to fax order to [phone]. Will call [facility] to determine what is needed to go forward with accepting pt to their facility.

## 2016-07-22 ENCOUNTER — Telehealth: Payer: Self-pay | Admitting: Physician Assistant

## 2016-07-22 NOTE — Telephone Encounter (Signed)
Received page that patient called answering service - note states patient is "at [facility] ER, feet are swollen and hurt, and toes hurt real bad." I called patient and did not hear back. Left message to please give our office a call back if assistance is still needed. Dayna Dunn PA-C

## 2016-07-22 NOTE — Telephone Encounter (Signed)
Have not received FL2 form. Called and left message on Rhonda's voicemail requesting that she refax form to nurses station. Awaiting form.

## 2016-07-22 NOTE — Telephone Encounter (Signed)
Spoke with Ko Olina at [place], [phone]. Fax) [phone]. She states she will need [place] to complete an FL2 for skilled nursing indicating how often pt will need PT. Reports that they will have to obtain notes from pt's outpt PT therapist and submit to insurance for them to determine if they will approve pt's admission to their facility. She states historically, [employer] will allow admission but usually for just a 2 week period. They will fax FL2 form for [place] to complete. Awaiting form.

## 2016-07-23 NOTE — Telephone Encounter (Signed)
Spoke with pt. States she is going to pick up packet from [facility] and deliver it to [place] today. Wants [place] to complete the [facility] for them. Advised her that PCP is out of the office until Friday and it will not be completed until then. Pt voices understanding. Awaiting packet from pt.

## 2016-07-23 NOTE — Telephone Encounter (Signed)
Patient no longer wants to go to the [facility] facility. She is requesting to go to a different facility, "[facility]" is calling to confirm the name of the facility and give them the office fax number. She would like a call back regarding this. Please advise  Phone: [phone]

## 2016-07-25 ENCOUNTER — Telehealth: Payer: Self-pay | Admitting: Family

## 2016-07-25 NOTE — Telephone Encounter (Signed)
Pt dropped off documents to be filled out and faxed, requesting copies for her records, documents placed in tray at front office

## 2016-07-25 NOTE — Telephone Encounter (Signed)
Pt brought forms to the office for [facility] for [facility] and forms for disability. Forms forwarded to PCP's yellow folder.

## 2016-07-28 ENCOUNTER — Telehealth: Payer: Self-pay | Admitting: Family

## 2016-07-28 NOTE — Telephone Encounter (Signed)
Caller name: Gaylyn Rong Relationship to patient: [facility] Can be reached: [phone] Pharmacy:  Reason for call: FYI: Patient was referred for evaluation for gait instability. Patient came for evaluation but Patient has not come back to any appts.

## 2016-07-29 NOTE — Telephone Encounter (Signed)
Pt trying to get into an inpatient rehab facility. See previous phone note.

## 2016-08-01 ENCOUNTER — Encounter: Payer: Self-pay | Admitting: Family

## 2016-08-01 ENCOUNTER — Encounter (HOSPITAL_BASED_OUTPATIENT_CLINIC_OR_DEPARTMENT_OTHER): Payer: Self-pay | Admitting: Emergency Medicine

## 2016-08-01 ENCOUNTER — Emergency Department (HOSPITAL_BASED_OUTPATIENT_CLINIC_OR_DEPARTMENT_OTHER): Payer: BLUE CROSS/BLUE SHIELD

## 2016-08-01 ENCOUNTER — Ambulatory Visit (INDEPENDENT_AMBULATORY_CARE_PROVIDER_SITE_OTHER): Payer: BLUE CROSS/BLUE SHIELD | Admitting: Family

## 2016-08-01 ENCOUNTER — Inpatient Hospital Stay (HOSPITAL_BASED_OUTPATIENT_CLINIC_OR_DEPARTMENT_OTHER)
Admission: EM | Admit: 2016-08-01 | Discharge: 2016-08-05 | DRG: 472 | Disposition: A | Discharge status: Skilled Nursing Facility | Payer: BLUE CROSS/BLUE SHIELD | Attending: Internal Medicine | Admitting: Internal Medicine

## 2016-08-01 ENCOUNTER — Telehealth: Payer: Self-pay | Admitting: Family

## 2016-08-01 VITALS — BP 132/78 | HR 84 | Temp 98.1°F | Resp 16 | Ht 66.0 in | Wt 275.0 lb

## 2016-08-01 DIAGNOSIS — R32 Unspecified urinary incontinence: Secondary | ICD-10-CM | POA: Diagnosis not present

## 2016-08-01 DIAGNOSIS — Z96651 Presence of right artificial knee joint: Secondary | ICD-10-CM | POA: Diagnosis present

## 2016-08-01 DIAGNOSIS — Z7401 Bed confinement status: Secondary | ICD-10-CM | POA: Diagnosis not present

## 2016-08-01 DIAGNOSIS — M79606 Pain in leg, unspecified: Secondary | ICD-10-CM

## 2016-08-01 DIAGNOSIS — I509 Heart failure, unspecified: Secondary | ICD-10-CM | POA: Diagnosis not present

## 2016-08-01 DIAGNOSIS — I110 Hypertensive heart disease with heart failure: Secondary | ICD-10-CM | POA: Diagnosis present

## 2016-08-01 DIAGNOSIS — R5381 Other malaise: Secondary | ICD-10-CM | POA: Diagnosis not present

## 2016-08-01 DIAGNOSIS — L8990 Pressure ulcer of unspecified site, unspecified stage: Secondary | ICD-10-CM

## 2016-08-01 DIAGNOSIS — R609 Edema, unspecified: Secondary | ICD-10-CM | POA: Diagnosis present

## 2016-08-01 DIAGNOSIS — R159 Full incontinence of feces: Secondary | ICD-10-CM

## 2016-08-01 DIAGNOSIS — Z419 Encounter for procedure for purposes other than remedying health state, unspecified: Secondary | ICD-10-CM

## 2016-08-01 DIAGNOSIS — E785 Hyperlipidemia, unspecified: Secondary | ICD-10-CM | POA: Diagnosis present

## 2016-08-01 DIAGNOSIS — M2578 Osteophyte, vertebrae: Secondary | ICD-10-CM | POA: Diagnosis present

## 2016-08-01 DIAGNOSIS — Z825 Family history of asthma and other chronic lower respiratory diseases: Secondary | ICD-10-CM

## 2016-08-01 DIAGNOSIS — I5023 Acute on chronic systolic (congestive) heart failure: Secondary | ICD-10-CM | POA: Diagnosis not present

## 2016-08-01 DIAGNOSIS — R739 Hyperglycemia, unspecified: Secondary | ICD-10-CM | POA: Diagnosis not present

## 2016-08-01 DIAGNOSIS — Z6841 Body Mass Index (BMI) 40.0 and over, adult: Secondary | ICD-10-CM

## 2016-08-01 DIAGNOSIS — I429 Cardiomyopathy, unspecified: Secondary | ICD-10-CM | POA: Diagnosis present

## 2016-08-01 DIAGNOSIS — K219 Gastro-esophageal reflux disease without esophagitis: Secondary | ICD-10-CM | POA: Diagnosis present

## 2016-08-01 DIAGNOSIS — R29898 Other symptoms and signs involving the musculoskeletal system: Secondary | ICD-10-CM

## 2016-08-01 DIAGNOSIS — L97929 Non-pressure chronic ulcer of unspecified part of left lower leg with unspecified severity: Secondary | ICD-10-CM | POA: Diagnosis present

## 2016-08-01 DIAGNOSIS — I5042 Chronic combined systolic (congestive) and diastolic (congestive) heart failure: Secondary | ICD-10-CM | POA: Diagnosis present

## 2016-08-01 DIAGNOSIS — G959 Disease of spinal cord, unspecified: Secondary | ICD-10-CM | POA: Diagnosis not present

## 2016-08-01 DIAGNOSIS — Z800 Family history of malignant neoplasm of digestive organs: Secondary | ICD-10-CM

## 2016-08-01 DIAGNOSIS — Z833 Family history of diabetes mellitus: Secondary | ICD-10-CM

## 2016-08-01 DIAGNOSIS — D649 Anemia, unspecified: Secondary | ICD-10-CM | POA: Diagnosis present

## 2016-08-01 DIAGNOSIS — L89522 Pressure ulcer of left ankle, stage 2: Secondary | ICD-10-CM | POA: Diagnosis not present

## 2016-08-01 DIAGNOSIS — M6281 Muscle weakness (generalized): Secondary | ICD-10-CM

## 2016-08-01 DIAGNOSIS — Z8249 Family history of ischemic heart disease and other diseases of the circulatory system: Secondary | ICD-10-CM | POA: Diagnosis not present

## 2016-08-01 DIAGNOSIS — M4712 Other spondylosis with myelopathy, cervical region: Secondary | ICD-10-CM | POA: Diagnosis not present

## 2016-08-01 DIAGNOSIS — G8220 Paraplegia, unspecified: Secondary | ICD-10-CM | POA: Diagnosis present

## 2016-08-01 DIAGNOSIS — D72828 Other elevated white blood cell count: Secondary | ICD-10-CM | POA: Diagnosis not present

## 2016-08-01 DIAGNOSIS — I10 Essential (primary) hypertension: Secondary | ICD-10-CM | POA: Diagnosis not present

## 2016-08-01 DIAGNOSIS — T380X5A Adverse effect of glucocorticoids and synthetic analogues, initial encounter: Secondary | ICD-10-CM | POA: Diagnosis not present

## 2016-08-01 DIAGNOSIS — Z66 Do not resuscitate: Secondary | ICD-10-CM | POA: Diagnosis present

## 2016-08-01 LAB — CBC WITH DIFFERENTIAL/PLATELET
BASOS ABS: 0 10*3/uL (ref 0.0–0.1)
Basophils Relative: 0 %
EOS ABS: 0.5 10*3/uL (ref 0.0–0.7)
EOS PCT: 8 %
HCT: 32.6 % — ABNORMAL LOW (ref 36.0–46.0)
Hemoglobin: 11.3 g/dL — ABNORMAL LOW (ref 12.0–15.0)
LYMPHS PCT: 27 %
Lymphs Abs: 1.8 10*3/uL (ref 0.7–4.0)
MCH: 29.2 pg (ref 26.0–34.0)
MCHC: 34.7 g/dL (ref 30.0–36.0)
MCV: 84.2 fL (ref 78.0–100.0)
Monocytes Absolute: 0.9 10*3/uL (ref 0.1–1.0)
Monocytes Relative: 14 %
NEUTROS PCT: 51 %
Neutro Abs: 3.3 10*3/uL (ref 1.7–7.7)
PLATELETS: 314 10*3/uL (ref 150–400)
RBC: 3.87 MIL/uL (ref 3.87–5.11)
RDW: 13.4 % (ref 11.5–15.5)
WBC: 6.5 10*3/uL (ref 4.0–10.5)

## 2016-08-01 LAB — COMPREHENSIVE METABOLIC PANEL
ALT: 27 U/L (ref 14–54)
AST: 29 U/L (ref 15–41)
Albumin: 3.6 g/dL (ref 3.5–5.0)
Alkaline Phosphatase: 63 U/L (ref 38–126)
Anion gap: 8 (ref 5–15)
BILIRUBIN TOTAL: 0.6 mg/dL (ref 0.3–1.2)
BUN: 15 mg/dL (ref 6–20)
CO2: 26 mmol/L (ref 22–32)
CREATININE: 0.87 mg/dL (ref 0.44–1.00)
Calcium: 9.1 mg/dL (ref 8.9–10.3)
Chloride: 107 mmol/L (ref 101–111)
GFR calc Af Amer: >60 mL/min (ref >60)
GFR calc non Af Amer: >60 mL/min (ref >60)
Glucose, Bld: 112 mg/dL — ABNORMAL HIGH (ref 65–99)
POTASSIUM: 3.6 mmol/L (ref 3.5–5.1)
Sodium: 141 mmol/L (ref 135–145)
TOTAL PROTEIN: 7.1 g/dL (ref 6.5–8.1)

## 2016-08-01 LAB — URINALYSIS, ROUTINE W REFLEX MICROSCOPIC
Bilirubin Urine: NEGATIVE
Glucose, UA: NEGATIVE mg/dL
Hgb urine dipstick: NEGATIVE
KETONES UR: NEGATIVE mg/dL
LEUKOCYTES UA: NEGATIVE
NITRITE: NEGATIVE
PROTEIN: NEGATIVE mg/dL
Specific Gravity, Urine: 1.013 (ref 1.005–1.030)
pH: 6 (ref 5.0–8.0)

## 2016-08-01 LAB — I-STAT TROPONIN, ED: Troponin i, poc: 0.01 ng/mL (ref 0.00–0.08)

## 2016-08-01 LAB — TROPONIN I

## 2016-08-01 LAB — BRAIN NATRIURETIC PEPTIDE: B NATRIURETIC PEPTIDE 5: 112.7 pg/mL — AB (ref 0.0–100.0)

## 2016-08-01 MED ORDER — METOPROLOL SUCCINATE ER 50 MG PO TB24
50.0000 mg | ORAL_TABLET | Freq: Every day | ORAL | Status: DC
  Administered 2016-08-02 – 2016-08-05 (×4): 50 mg via ORAL
  Filled 2016-08-01 (×4): qty 1

## 2016-08-01 MED ORDER — POLYETHYLENE GLYCOL 3350 17 G PO PACK: 17.0000 g | PACK | Freq: Every day | ORAL | Status: DC | PRN

## 2016-08-01 MED ORDER — GABAPENTIN 300 MG PO CAPS
600.0000 mg | ORAL_CAPSULE | Freq: Three times a day (TID) | ORAL | Status: DC
  Administered 2016-08-01 – 2016-08-05 (×11): 600 mg via ORAL
  Filled 2016-08-01 (×11): qty 2

## 2016-08-01 MED ORDER — ALBUTEROL SULFATE (2.5 MG/3ML) 0.083% IN NEBU: 2.5000 mg | INHALATION_SOLUTION | Freq: Four times a day (QID) | RESPIRATORY_TRACT | Status: DC | PRN

## 2016-08-01 MED ORDER — ALPRAZOLAM 0.5 MG PO TABS
0.5000 mg | ORAL_TABLET | ORAL | Status: DC | PRN
  Administered 2016-08-02 – 2016-08-03 (×2): 0.5 mg via ORAL
  Filled 2016-08-01 (×2): qty 1

## 2016-08-01 MED ORDER — ENOXAPARIN SODIUM 40 MG/0.4ML ~~LOC~~ SOLN
40.0000 mg | SUBCUTANEOUS | Status: DC
  Administered 2016-08-01 – 2016-08-04 (×4): 40 mg via SUBCUTANEOUS
  Filled 2016-08-01 (×4): qty 0.4

## 2016-08-01 MED ORDER — ACETAMINOPHEN 650 MG RE SUPP: 650.0000 mg | Freq: Four times a day (QID) | RECTAL | Status: DC | PRN

## 2016-08-01 MED ORDER — HYDROCODONE-ACETAMINOPHEN 5-325 MG PO TABS
1.0000 | ORAL_TABLET | ORAL | Status: DC | PRN
  Administered 2016-08-02 (×2): 1 via ORAL
  Filled 2016-08-01 (×2): qty 1

## 2016-08-01 MED ORDER — TRAMADOL HCL 50 MG PO TABS
50.0000 mg | ORAL_TABLET | Freq: Four times a day (QID) | ORAL | Status: DC | PRN
  Administered 2016-08-01 – 2016-08-02 (×2): 50 mg via ORAL
  Filled 2016-08-01 (×2): qty 1

## 2016-08-01 MED ORDER — ONDANSETRON HCL 4 MG/2ML IJ SOLN: 4.0000 mg | Freq: Four times a day (QID) | INTRAMUSCULAR | Status: DC | PRN

## 2016-08-01 MED ORDER — SODIUM CHLORIDE 0.9% FLUSH
3.0000 mL | Freq: Two times a day (BID) | INTRAVENOUS | Status: DC
  Administered 2016-08-01 – 2016-08-05 (×5): 3 mL via INTRAVENOUS

## 2016-08-01 MED ORDER — LOSARTAN POTASSIUM 50 MG PO TABS
50.0000 mg | ORAL_TABLET | Freq: Every day | ORAL | Status: DC
  Administered 2016-08-02 – 2016-08-05 (×4): 50 mg via ORAL
  Filled 2016-08-01 (×4): qty 1

## 2016-08-01 MED ORDER — SENNA 8.6 MG PO TABS
1.0000 | ORAL_TABLET | Freq: Two times a day (BID) | ORAL | Status: DC
  Administered 2016-08-01 – 2016-08-04 (×6): 8.6 mg via ORAL
  Filled 2016-08-01 (×8): qty 1

## 2016-08-01 MED ORDER — FUROSEMIDE 10 MG/ML IJ SOLN
40.0000 mg | Freq: Once | INTRAMUSCULAR | Status: AC
  Administered 2016-08-01: 40 mg via INTRAVENOUS
  Filled 2016-08-01: qty 4

## 2016-08-01 MED ORDER — ACETAMINOPHEN 325 MG PO TABS: 650.0000 mg | ORAL_TABLET | Freq: Four times a day (QID) | ORAL | Status: DC | PRN

## 2016-08-01 MED ORDER — MORPHINE SULFATE (PF) 4 MG/ML IV SOLN
4.0000 mg | Freq: Once | INTRAVENOUS | Status: AC
  Administered 2016-08-01: 4 mg via INTRAVENOUS
  Filled 2016-08-01: qty 1

## 2016-08-01 MED ORDER — ONDANSETRON HCL 4 MG PO TABS: 4.0000 mg | ORAL_TABLET | Freq: Four times a day (QID) | ORAL | Status: DC | PRN

## 2016-08-01 MED ORDER — TORSEMIDE 20 MG PO TABS
10.0000 mg | ORAL_TABLET | Freq: Every day | ORAL | Status: DC
  Administered 2016-08-02 – 2016-08-05 (×4): 10 mg via ORAL
  Filled 2016-08-01 (×4): qty 1

## 2016-08-01 NOTE — ED Provider Notes (Signed)
[facility] Provider Note   CSN: [id number] Arrival date & time: 08/01/16  1206     History   Chief Complaint Chief Complaint  Patient presents with  . Leg Swelling    HPI Briana Miller is a 61 y.o. female.  The history is provided by the patient and the spouse.  Weakness  Primary symptoms comment: Generalized weakness. This is a chronic problem. Episode onset: 6 months ago. The problem has been gradually worsening. There was no focality noted. There has been no fever. Pertinent negatives include no shortness of breath, no chest pain, no altered mental status, no confusion and no headaches.    Past Medical History:  Diagnosis Date  . Acute systolic congestive heart failure (HCC) 03/01/2014  . Allergy   . Anemia   . Arthritis   . Cardiomyopathy (HCC)   . CHF (congestive heart failure) (HCC)   . GERD (gastroesophageal reflux disease)   . Heart disease   . Hyperlipidemia   . Hypertension   . Leg pain   . Overactive bladder     Patient Active Problem List   Diagnosis Date Noted  . Low back pain 03/09/2016  . HTN (hypertension) 02/25/2016  . Right patella fracture 01/20/2016  . Wound of right lower extremity 01/20/2016  . Wound of left lower extremity 01/20/2016  . Arthritis 01/20/2016  . Preop cardiovascular exam 09/12/2015  . Venous stasis ulcers (HCC) 06/23/2015  . Overactive bladder 06/23/2015  . GERD (gastroesophageal reflux disease) 06/23/2015  . CHF (congestive heart failure) (HCC) 05/15/2015  . Preventative health care 03/23/2015  . Migraines 03/23/2015  . Borderline diabetes 05/18/2014  . Acute systolic congestive heart failure (HCC) 03/01/2014  . Hypertensive heart disease with CHF (congestive heart failure) (HCC) 06/02/2013  . Allergic rhinitis 06/01/2013  . Congestive dilated cardiomyopathy (HCC) 06/01/2013  . OSA (obstructive sleep apnea) 04/08/2013  . Family history of colon cancer 04/08/2013  . Severe obesity (BMI >= 40) (HCC)  04/08/2013    Past Surgical History:  Procedure Laterality Date  . COLONOSCOPY    . KNEE ARTHROSCOPY     2007  . ROTATOR CUFF REPAIR Right 03/23/13  . TOTAL KNEE ARTHROPLASTY  09/22/2011   Procedure: TOTAL KNEE ARTHROPLASTY;  Surgeon: Raymon Mutton, MD;  Location: [facility] OR;  Service: Orthopedics;  Laterality: Right;    OB History    No data available       Home Medications    Prior to Admission medications   Medication Sig Start Date End Date Taking? Authorizing Provider  albuterol (PROVENTIL HFA;VENTOLIN HFA) 108 (90 BASE) MCG/ACT inhaler Inhale 2 puffs into the lungs every 6 (six) hours as needed for wheezing or shortness of breath. 05/16/14   Sandford Craze, NP  calcium-vitamin D (OSCAL WITH D) 500-200 MG-UNIT per tablet Take 1 tablet by mouth daily.    Historical Provider, MD  diclofenac sodium (VOLTAREN) 1 % GEL Apply 4 g topically 2 (two) times daily. To right knee 04/01/16   Sandford Craze, NP  gabapentin (NEURONTIN) 300 MG capsule Take 2 capsules (600 mg total) by mouth 3 (three) times daily. 04/18/16   Levert Feinstein, MD  losartan (COZAAR) 50 MG tablet TAKE 1 TABLET BY MOUTH EVERY DAY 06/18/16   Lewayne Bunting, MD  metoprolol succinate (TOPROL-XL) 50 MG 24 hr tablet TAKE 1 TABLET BY MOUTH EVERY DAY 11/01/15   Lewayne Bunting, MD  torsemide (DEMADEX) 10 MG tablet TAKE 1 TABLET BY MOUTH EVERY DAY 06/18/16   Madolyn Frieze  Jens Som, MD    Family History Family History  Problem Relation Age of Onset  . Cancer Mother     colon 80 and breast 68- deceased  . Colon cancer Mother   . Diabetes Father     living  . Asthma Father   . Hypertension Father   . Heart attack Father 85    medical management per pt  . Glaucoma Father     had eye transplant  . Kidney failure Father     acute renal failure- died 84  . Esophageal cancer Neg Hx   . Stomach cancer Neg Hx   . Rectal cancer Neg Hx     Social History Social History  Substance Use Topics  . Smoking status: Never Smoker  .  Smokeless tobacco: Never Used  . Alcohol use 1.2 oz/week    2 Glasses of wine per week     Allergies   Patient has no known allergies.   Review of Systems Review of Systems  Constitutional: Positive for fatigue.  Respiratory: Negative for shortness of breath.   Cardiovascular: Negative for chest pain.  Neurological: Positive for weakness. Negative for headaches.  Psychiatric/Behavioral: Negative for confusion.     Physical Exam Updated Vital Signs BP 195/80 (BP Location: Left Arm)   Pulse 96   Temp 98.4 F (36.9 C) (Oral)   Resp 16   Ht 5\' 6"  (1.676 m)   Wt 275 lb (124.7 kg)   LMP 06/17/2007   SpO2 99%   BMI 44.39 kg/m   Physical Exam  Constitutional: She is oriented to person, place, and time. She appears well-developed and well-nourished. No distress.  HENT:  Head: Normocephalic and atraumatic.  Mouth/Throat: Oropharynx is clear and moist.  Neck: Normal range of motion. Neck supple.  Cardiovascular: Normal rate and regular rhythm.  Exam reveals no gallop and no friction rub.   No murmur heard. Pulmonary/Chest: Effort normal and breath sounds normal. No respiratory distress. She has no wheezes.  Abdominal: Soft. Bowel sounds are normal. She exhibits no distension. There is no tenderness.  Musculoskeletal: Normal range of motion. She exhibits edema.  There is marked edema of both lower extremities, the left greater than right. There are small areas of skin breakdown located to the left posterior thigh, buttock, and left heel. There is mild surrounding erythema.  Neurological: She is alert and oriented to person, place, and time.  Skin: Skin is warm and dry. She is not diaphoretic.  Nursing note and vitals reviewed.    ED Treatments / Results  Labs (all labs ordered are listed, but only abnormal results are displayed) Labs Reviewed  COMPREHENSIVE METABOLIC PANEL  CBC WITH DIFFERENTIAL/PLATELET  BRAIN NATRIURETIC PEPTIDE  TROPONIN I  URINALYSIS, ROUTINE W  REFLEX MICROSCOPIC    EKG  EKG Interpretation None       Radiology No results found.  Procedures Procedures (including critical care time)  Medications Ordered in ED Medications - No data to display   Initial Impression / Assessment and Plan / ED Course  I have reviewed the triage vital signs and the nursing notes.  Pertinent labs & imaging results that were available during my care of the patient were reviewed by me and considered in my medical decision making (see chart for details).  Patient referred from up stairs for evaluation of leg swelling and inability to ambulate. She has apparently been experiencing many months worth of decreased mobility secondary to this. Her laboratory studies reveal no obvious abnormality. She was  given IV Lasix and will be admitted to the hospitalist service under the care of Dr. Elisabeth Pigeon. She will undergo further workup, diuresis, and likely physical therapy. She may require short-term placement in a rehabilitation facility as she has been there in the past.  Final Clinical Impressions(s) / ED Diagnoses   Final diagnoses:  None    New Prescriptions New Prescriptions   No medications on file     Geoffery Lyons, MD 08/01/16 2334

## 2016-08-01 NOTE — H&P (Signed)
Briana Miller VHQ:[id number] DOB: 1956/05/15 DOA: 08/01/2016     PCP: Lemont Fillers., NP   Outpatient Specialists: orthopedics  Patient coming from:    home Lives  With family   Chief Complaint: Bilateral lower extremity weakness and swelling  HPI: Briana Miller is a 61 y.o. female with medical history significant of chronic CHF, cardiomyopathy, hypertension, arthritis, anemia    Presented with oral and 6 month history of lower extremity swelling and generalized fatigue. Symptoms started in July 2017 last week patient was seen in the emergency department for this and had negative lower extremity Dopplers normal BNP and kidney function.  In July patient had a fall and at that time was seen in [place] since then she also had a fall and had to be hospitalized to [facility] and [facility] was discharged in October to home with home care she never regained any ability to walk but was able to pivot and stand. Family states that she has had some bed sores. The weakness has progressed and she is unable to assist with her transfers she's been having bowel incontinence.  She was seen by her primary care provider today was noted to have bilateral lower extremity contractures with left lower extremity strengthening 0 out of 5 she notes to have ulcers of the left lower leg and buttocks. CT abdomen and pelvis in 2017 showed some lumbar spine degenerative changes.      IN ER:  Temp (24hrs), Avg:98.2 F (36.8 C), Min:98.1 F (36.7 C), Max:98.4 F (36.9 C)      Afebrile, RR 18 Hr 91 troponin 0.01 albumin 3.6 sodium 141 creatinine 0.87 WBC 6.5 hemoglobin 11.3 Chest x-ray no active cardiopulmonary disease degenerative changes bilateral shoulders right greater than left  Following Medications were ordered in ER: Medications  morphine 4 MG/ML injection 4 mg (4 mg Intravenous Given 08/01/16 1712)  furosemide (LASIX) injection 40 mg (40 mg Intravenous  Given 08/01/16 1753)    Hospitalist was called for admission forProgressive lower extremity weakness with bedsores and leg edema  Review of Systems:    Pertinent positives include:  fatigue leg edema and weakness  Constitutional:  No weight loss, night sweats, Fevers, chills,, weight loss  HEENT:  No headaches, Difficulty swallowing,Tooth/dental problems,Sore throat,  No sneezing, itching, ear ache, nasal congestion, post nasal drip,  Cardio-vascular:  No chest pain, Orthopnea, PND, anasarca, dizziness, palpitations  GI:  No heartburn, indigestion, abdominal pain, nausea, vomiting, diarrhea, change in bowel habits, loss of appetite, melena, blood in stool, hematemesis Resp:  no shortness of breath at rest. No dyspnea on exertion, No excess mucus, no productive cough, No non-productive cough, No coughing up of blood.No change in color of mucus.No wheezing. Skin:  no rash or lesions. No jaundice GU:  no dysuria, change in color of urine, no urgency or frequency. No straining to urinate.  No flank pain.  Musculoskeletal:  No joint pain or no joint swelling. No decreased range of motion. No back pain.  Psych:  No change in mood or affect. No depression or anxiety. No memory loss.  Neuro: no localizing neurological complaints, no tingling, no weakness, no double vision, no gait abnormality, no slurred speech, no confusion  As per HPI otherwise 10 point review of systems negative.   Past Medical History: Past Medical History:  Diagnosis Date  . Acute systolic congestive heart failure (HCC) 03/01/2014  . Allergy   . Anemia   . Arthritis   .  Cardiomyopathy (HCC)   . CHF (congestive heart failure) (HCC)   . GERD (gastroesophageal reflux disease)   . Heart disease   . Hyperlipidemia   . Hypertension   . Leg pain   . Overactive bladder    Past Surgical History:  Procedure Laterality Date  . COLONOSCOPY    . KNEE ARTHROSCOPY     2007  . ROTATOR CUFF REPAIR Right 03/23/13  .  TOTAL KNEE ARTHROPLASTY  09/22/2011   Procedure: TOTAL KNEE ARTHROPLASTY;  Surgeon: Raymon Mutton, MD;  Location: [facility];  Service: Orthopedics;  Laterality: Right;     Social History:  Ambulatory   bed bound    reports that she has never smoked. She has never used smokeless tobacco. She reports that she drinks about 1.2 oz of alcohol per week . She reports that she does not use drugs.  Allergies:  No Known Allergies     Family History:   Family History  Problem Relation Age of Onset  . Cancer Mother     colon 83 and breast 73- deceased  . Colon cancer Mother   . Diabetes Father     living  . Asthma Father   . Hypertension Father   . Heart attack Father 33    medical management per pt  . Glaucoma Father     had eye transplant  . Kidney failure Father     acute renal failure- died 65  . Esophageal cancer Neg Hx   . Stomach cancer Neg Hx   . Rectal cancer Neg Hx     Medications: Prior to Admission medications   Medication Sig Start Date End Date Taking? Authorizing Provider  albuterol (PROVENTIL HFA;VENTOLIN HFA) 108 (90 BASE) MCG/ACT inhaler Inhale 2 puffs into the lungs every 6 (six) hours as needed for wheezing or shortness of breath. 05/16/14   Sandford Craze, NP  calcium-vitamin D (OSCAL WITH D) 500-200 MG-UNIT per tablet Take 1 tablet by mouth daily.    Historical Provider, MD  diclofenac sodium (VOLTAREN) 1 % GEL Apply 4 g topically 2 (two) times daily. To right knee 04/01/16   Sandford Craze, NP  gabapentin (NEURONTIN) 300 MG capsule Take 2 capsules (600 mg total) by mouth 3 (three) times daily. 04/18/16   Levert Feinstein, MD  losartan (COZAAR) 50 MG tablet TAKE 1 TABLET BY MOUTH EVERY DAY 06/18/16   Lewayne Bunting, MD  metoprolol succinate (TOPROL-XL) 50 MG 24 hr tablet TAKE 1 TABLET BY MOUTH EVERY DAY 11/01/15   Lewayne Bunting, MD  torsemide (DEMADEX) 10 MG tablet TAKE 1 TABLET BY MOUTH EVERY DAY 06/18/16   Lewayne Bunting, MD    Physical Exam: Patient  Vitals for the past 24 hrs:  BP Temp Temp src Pulse Resp SpO2 Height Weight  08/01/16 1915 (!) 148/75 98.1 F (36.7 C) Oral 79 18 99 % 5\' 5"  (1.651 m) 117.4 kg (258 lb 14.4 oz)  08/01/16 1728 146/80 98.2 F (36.8 C) Oral 81 18 97 % - -  08/01/16 1727 146/80 - - 82 21 97 % - -  08/01/16 1700 131/72 - - 82 20 98 % - -  08/01/16 1228 195/80 98.4 F (36.9 C) Oral 96 16 99 % 5\' 6"  (1.676 m) 124.7 kg (275 lb)    1. General:  in No Acute distress 2. Psychological: Alert and  Oriented 3. Head/ENT:   Moist   Mucous Membranes  Head Non traumatic, neck supple                          Normal  Dentition 4. SKIN:  decreased Skin turgor,  Skin clean DryEvidence of pressure ulcers with mild skin breakdown  on the legs bilaterally 5. Heart: Regular rate and rhythm no  Murmur, Rub or gallop 6. Lungs: Clear to auscultation bilaterally, no wheezes or crackles   7. Abdomen: Soft,  non-tender, Non distended 8. Lower extremities: no clubbing, cyanosis,3+edema 9. Neurologically   strength 0 out of 5 in lower extremities and 4 out of 5 in upper extremities cranial nerves II through XII intact 10. MSK: Normal range of motion   body mass index is 43.08 kg/m.  Labs on Admission:   Labs on Admission: I have personally reviewed following labs and imaging studies  CBC:  Recent Labs Lab 08/01/16 1546  WBC 6.5  NEUTROABS 3.3  HGB 11.3*  HCT 32.6*  MCV 84.2  PLT 314   Basic Metabolic Panel:  Recent Labs Lab 08/01/16 1546  NA 141  K 3.6  CL 107  CO2 26  GLUCOSE 112*  BUN 15  CREATININE 0.87  CALCIUM 9.1   GFR: Estimated Creatinine Clearance: 90.4 mL/min (by C-G formula based on SCr of 0.87 mg/dL). Liver Function Tests:  Recent Labs Lab 08/01/16 1546  AST 29  ALT 27  ALKPHOS 63  BILITOT 0.6  PROT 7.1  ALBUMIN 3.6   No results for input(s): LIPASE, AMYLASE in the last 168 hours. No results for input(s): AMMONIA in the last 168 hours. Coagulation  Profile: No results for input(s): INR, PROTIME in the last 168 hours. Cardiac Enzymes: No results for input(s): CKTOTAL, CKMB, CKMBINDEX, TROPONINI in the last 168 hours. BNP (last 3 results) No results for input(s): PROBNP in the last 8760 hours. HbA1C: No results for input(s): HGBA1C in the last 72 hours. CBG: No results for input(s): GLUCAP in the last 168 hours. Lipid Profile: No results for input(s): CHOL, HDL, LDLCALC, TRIG, CHOLHDL, LDLDIRECT in the last 72 hours. Thyroid Function Tests: No results for input(s): TSH, T4TOTAL, FREET4, T3FREE, THYROIDAB in the last 72 hours. Anemia Panel: No results for input(s): VITAMINB12, FOLATE, FERRITIN, TIBC, IRON, RETICCTPCT in the last 72 hours. Urine analysis:    Component Value Date/Time   COLORURINE YELLOW 08/01/2016 1604   APPEARANCEUR CLEAR 08/01/2016 1604   LABSPEC 1.013 08/01/2016 1604   PHURINE 6.0 08/01/2016 1604   GLUCOSEU NEGATIVE 08/01/2016 1604   GLUCOSEU NEGATIVE 09/21/2015 0957   HGBUR NEGATIVE 08/01/2016 1604   BILIRUBINUR NEGATIVE 08/01/2016 1604   BILIRUBINUR negative 06/22/2015 1046   KETONESUR NEGATIVE 08/01/2016 1604   PROTEINUR NEGATIVE 08/01/2016 1604   UROBILINOGEN 0.2 09/21/2015 0957   NITRITE NEGATIVE 08/01/2016 1604   LEUKOCYTESUR NEGATIVE 08/01/2016 1604   Sepsis Labs: @LABRCNTIP (procalcitonin:4,lacticidven:4) )No results found for this or any previous visit (from the past 240 hour(s)).     UA  no evidence of UTI    Lab Results  Component Value Date   HGBA1C 6.4 02/21/2016    Estimated Creatinine Clearance: 90.4 mL/min (by C-G formula based on SCr of 0.87 mg/dL).  BNP (last 3 results) No results for input(s): PROBNP in the last 8760 hours.   ECG REPORT  Independently reviewed Rate:76  Rhythm: nsr ST&T Change: No acute ischemic changes   QTC 482  Filed Weights   08/01/16 1228 08/01/16 1915  Weight: 124.7 kg (275 lb) 117.4 kg (258  lb 14.4 oz)     Cultures:    Component Value  Date/Time   SDES URINE, CLEAN CATCH 09/11/2011 1144   SPECREQUEST NONE 09/11/2011 1144   CULT NO GROWTH 09/11/2011 1144   REPTSTATUS 09/12/2011 FINAL 09/11/2011 1144     Radiological Exams on Admission: Dg Chest 2 View  Result Date: 08/01/2016 CLINICAL DATA:  Left groin pain, left leg swelling, weakness EXAM: CHEST  2 VIEW COMPARISON:  04/06/2014 FINDINGS: Cardiomediastinal silhouette is stable. No infiltrate or pleural effusion. No pulmonary edema. Extensive degenerative changes right shoulder. Moderate degenerative changes left shoulder. Osteopenia and mild degenerative changes mid thoracic spine. IMPRESSION: No active cardiopulmonary disease. Degenerative changes bilateral shoulders right greater than left. Electronically Signed   By: Natasha Mead M.D.   On: 08/01/2016 15:58    Chart has been reviewed    Assessment/Plan   61 y.o. female with medical history significant of chronic CHF, cardiomyopathy, hypertension, arthritis, anemia admitted for bilateral lower extremity edema and profound lower extremity weakness of unclear etiology workup for possible CHF exacerbation  Present on Admission: . Peripheral edema - possibly dependent edema have had negative Doppler scans recently will not repeat. Apply ted hose, will need better mobilization, Also possibility of CHF exacerbation she received Lasix in the emergency department this evening has any diuresis, continue home torsemide for now.  Questionable chf exacerbation we'll repeat echogram patch on telemetry cycle cardiac enzymes given worsening peripheral edema. This could also be secondary to dependent edema . HTN (hypertension) stable continue home medications . Debility PT OT evaluation . Decubitus ulcer - wound care consult Progressive lower extremity weakness spoke to  Neurology  patient had had a recent CT scan in December which was unremarkable will order MRI of cervical, thoracic and lumbar  Spine to rule out any lesions. If normal  or showing demyelination please consult neurology in [place]   Other plan as per orders.  DVT prophylaxis:    Lovenox     Code Status:  FULL CODE  DNR/DNI   as per patient    Family Communication:   Family not  at  Bedside    Disposition Plan:    likely will need placement for rehabilitation                                             Would benefit from PT/OT eval prior to DC   ordered                       Social Work                           Consults called: none    Admission status:   inpatient   obs   Level of care     tele        I have spent a total of 67 min on this admission   extra time was spent to discuss case with neurology  Levone Otten 08/01/2016, 9:00 PM    [facility]  Pager [phone]   after 2 AM please page floor coverage PA If 7AM-7PM, please contact the day team taking care of the patient  [email or web address]  Password [email or web address]

## 2016-08-01 NOTE — ED Notes (Signed)
Patient transported to x-ray. ?

## 2016-08-01 NOTE — Telephone Encounter (Signed)
Patient has insurance patient from [employer] and she needs it completed and faxed in to then numbers on the cover sheet to her job and [employer].   [phone]

## 2016-08-01 NOTE — Telephone Encounter (Signed)
Discussed today at [facility]>

## 2016-08-01 NOTE — Progress Notes (Signed)
Pre visit review using our clinic review tool, if applicable. No additional management support is needed unless otherwise documented below in the visit note. 

## 2016-08-01 NOTE — Progress Notes (Signed)
Subjective:    Patient ID: Briana Miller, female    DOB: 08-24-1955, 61 y.o.   MRN: [id number]  HPI  Briana Miller is a 61 yr old female with hx of CHF who presents today to discuss weakness and falls and LE swelling.  Her symptoms began back in July of 2017.  She had a ED visit on 07/22/16 which noted Neg LE doppler, mild anemia (hgb 11.6), normal bnp, normal bun/cr.    Had fall on 12/15/15.  She went to [facility]  Larey Seat again 01/11/16.  Reports that she fell twice on the right leg and was unable to bear weight. She stayed at [facility] x 3 days, then she went to [facility] x 2 months. She left [facility] 10/2, then [place] home care came in. She reports at that time she was able to pivot and stand.  Never did she regain her ability to walk.  [facility].  She reports limited UE strength as well as LE strength.   She has some groin pain. Denies LE back pain.  She has some "bad bedsores."  Family (husband and daughter) report that she is now complete care and they are unable to care for her at home. She is also experiencing bowel incontinence and inability to sense when she needs to have a BM.      Review of Systems    see HPI  Past Medical History:  Diagnosis Date  . Acute systolic congestive heart failure (HCC) 03/01/2014  . Allergy   . Anemia   . Arthritis   . Cardiomyopathy (HCC)   . CHF (congestive heart failure) (HCC)   . GERD (gastroesophageal reflux disease)   . Heart disease   . Hyperlipidemia   . Hypertension   . Leg pain   . Overactive bladder      Social History   Social History  . Marital status: Married    Spouse name: N/A  . Number of children: 2  . Years of education: HS   Occupational History  . [employer]   Social History Main Topics  . Smoking status: Never Smoker  . Smokeless tobacco: Never Used  . Alcohol use 1.2 oz/week    2 Glasses of wine per week  . Drug use: No  . Sexual activity: Not on file   Other  Topics Concern  . 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   1-2 cups caffeine daily.   Lives at home with her husband.       Past Surgical History:  Procedure Laterality Date  . COLONOSCOPY    . KNEE ARTHROSCOPY     2007  . ROTATOR CUFF REPAIR Right 03/23/13  . TOTAL KNEE ARTHROPLASTY  09/22/2011   Procedure: TOTAL KNEE ARTHROPLASTY;  Surgeon: Raymon Mutton, MD;  Location: [facility] OR;  Service: Orthopedics;  Laterality: Right;    Family History  Problem Relation Age of Onset  . Cancer Mother     colon 36 and breast 68- deceased  . Colon cancer Mother   . Diabetes Father     living  . Asthma Father   . Hypertension Father   .  Heart attack Father 33    medical management per pt  . Glaucoma Father     had eye transplant  . Kidney failure Father     acute renal failure- died 59  . Esophageal cancer Neg Hx   . Stomach cancer Neg Hx   . Rectal cancer Neg Hx     No Known Allergies  Current Outpatient Prescriptions on File Prior to Visit  Medication Sig Dispense Refill  . albuterol (PROVENTIL HFA;VENTOLIN HFA) 108 (90 BASE) MCG/ACT inhaler Inhale 2 puffs into the lungs every 6 (six) hours as needed for wheezing or shortness of breath. 1 Inhaler 0  . calcium-vitamin D (OSCAL WITH D) 500-200 MG-UNIT per tablet Take 1 tablet by mouth daily.    . diclofenac sodium (VOLTAREN) 1 % GEL Apply 4 g topically 2 (two) times daily. To right knee 100 g 2  . gabapentin (NEURONTIN) 300 MG capsule Take 2 capsules (600 mg total) by mouth 3 (three) times daily. 180 capsule 11  . losartan (COZAAR) 50 MG tablet TAKE 1 TABLET BY MOUTH EVERY DAY 30 tablet 0  . metoprolol succinate (TOPROL-XL) 50 MG 24 hr tablet TAKE 1 TABLET BY MOUTH  EVERY DAY 90 tablet 1  . torsemide (DEMADEX) 10 MG tablet TAKE 1 TABLET BY MOUTH EVERY DAY 30 tablet 0   No current facility-administered medications on file prior to visit.     BP 132/78 (BP Location: Left Arm, Cuff Size: Large)   Pulse 84   Temp 98.1 F (36.7 C) (Oral)   Resp 16   Ht 5\' 6"  (1.676 m)   Wt 275 lb (124.7 kg) Comment: 312 in wheelchair. Wheelchair wt 37lb  LMP 06/17/2007   SpO2 98% Comment: room air  BMI 44.39 kg/m    Objective:   Physical Exam  Constitutional: She is oriented to person, place, and time. She appears well-developed and well-nourished.  HENT:  Head: Atraumatic.  Cardiovascular: Normal rate, regular rhythm and normal heart sounds.   No murmur heard. Pulmonary/Chest: Effort normal and breath sounds normal. No respiratory distress. She has no wheezes.  Musculoskeletal:  3+ bilateral LE swelling.  Bilateral Bilateral legs are stiff and contracted. LE strength is 0/5.  Neurological: She is alert and oriented to person, place, and time.  UE strength is 4/5 bilaterally  Skin: Skin is warm and dry.  Unable to full evaluate skin due to contractures and seated in wheelchair.  Note is made of a stage 2 ulcer left lower posterior leg.   Daughter reports ulcer behind left knee and on buttocks but I am unable to visualize in the wheelchair.   Psychiatric: She has a normal mood and affect. Her behavior is normal. Judgment and thought content normal.          Assessment & Plan:  CHF-  Patient appears volume overloaded today with significant LE edema. Would benefit from IV diuresis. She is up 13 pounds since the last time that we weighed her.   Wt Readings from Last 3 Encounters:  08/01/16 275 lb (124.7 kg)  08/01/16 275 lb (124.7 kg)  04/21/16 262 lb (118.8 kg)   LE weakness/bowel incontinence- concerning for spinal lesion versus neurodegenerative disorder given her rapid decline from a high functioning/working individual to a complete care status.  Really  needs a higher level of care than she can receive at home.    Decub ulcers- discussed importance of good nutrition, frequent turning and purchasing a gel pad for bed and chair.  Report given to Dr. Rennis Chris, ER physician at the [facility] ED>

## 2016-08-01 NOTE — Progress Notes (Signed)
   08/01/16 1915  Vitals  Temp 98.1 F (36.7 C)  Temp Source Oral  BP (!) 148/75  MAP (mmHg) 93  BP Location Left Arm  BP Method Automatic  Patient Position (if appropriate) Lying  Pulse Rate 79  Pulse Rate Source Dinamap  Resp 18  Oxygen Therapy  SpO2 99 %  O2 Device Room Air  Height and Weight  Height 5\' 5"  (1.651 m)  Weight 117.4 kg (258 lb 14.4 oz)  Type of Scale Used Bed  Type of Weight Actual  BSA (Calculated - sq m) 2.32 sq meters  BMI (Calculated) 43.2  Weight in (lb) to have BMI = 25 149.9  Admitted pt to rm 3E11 , pt alert and oriented, placed on bed comfortably, oriented to room, call bell placed within reach, admission assessment done, placed on cardiac monitor, CCMD made aware. MD notified for this admission.

## 2016-08-01 NOTE — ED Notes (Signed)
ED Provider at bedside. 

## 2016-08-01 NOTE — Patient Instructions (Signed)
Please go directly to the ED for further evaluation.

## 2016-08-01 NOTE — ED Notes (Addendum)
Patient reporting pain to left groin.  States that it "feels like there is something there".  States her pain in her groin is a "60".  EDP made aware.

## 2016-08-01 NOTE — ED Triage Notes (Signed)
Patient from Dr. Pincus Sanes office.  Reports sent down for bilateral leg swelling-left worse than right- as well as pressure ulcers on bottom.  Patient also reports Dr. Lendell Caprice "wanted to run a test on my back"-unsure of what this test was.

## 2016-08-02 ENCOUNTER — Inpatient Hospital Stay (HOSPITAL_COMMUNITY): Payer: BLUE CROSS/BLUE SHIELD

## 2016-08-02 DIAGNOSIS — I509 Heart failure, unspecified: Secondary | ICD-10-CM

## 2016-08-02 DIAGNOSIS — I5023 Acute on chronic systolic (congestive) heart failure: Secondary | ICD-10-CM

## 2016-08-02 LAB — ECHOCARDIOGRAM COMPLETE
CHL CUP RV SYS PRESS: 39 mmHg
CHL CUP TV REG PEAK VELOCITY: 300 cm/s
E/e' ratio: 8.9
EWDT: 201 ms
FS: 26 % — AB (ref 28–44)
HEIGHTINCHES: 65 in
IVS/LV PW RATIO, ED: 0.83
LA diam end sys: 43 mm
LA diam index: 1.96 cm/m2
LA vol A4C: 59.9 ml
LA vol index: 28.9 mL/m2
LA vol: 63.2 mL
LASIZE: 43 mm
LDCA: 2.84 cm2
LV E/e' medial: 8.9
LV E/e'average: 8.9
LV SIMPSON'S DISK: 49
LV TDI E'LATERAL: 10.9
LV e' LATERAL: 10.9 cm/s
LV sys vol index: 24 mL/m2
LVDIAVOL: 103 mL (ref 46–106)
LVDIAVOLIN: 47 mL/m2
LVOT SV: 61 mL
LVOT VTI: 21.5 cm
LVOT diameter: 19 mm
LVOT peak grad rest: 5 mmHg
LVOTPV: 115 cm/s
LVSYSVOL: 53 mL — AB (ref 14–42)
MV Dec: 201
MV Peak grad: 4 mmHg
MV pk A vel: 108 m/s
MVPKEVEL: 97 m/s
PW: 10.2 mm — AB (ref 0.6–1.1)
RV TAPSE: 23 mm
Stroke v: 50 ml
TDI e' medial: 5.63
TR max vel: 300 cm/s
WEIGHTICAEL: 4075.2 [oz_av]

## 2016-08-02 LAB — COMPREHENSIVE METABOLIC PANEL
ALK PHOS: 56 U/L (ref 38–126)
ALT: 24 U/L (ref 14–54)
AST: 26 U/L (ref 15–41)
Albumin: 3.3 g/dL — ABNORMAL LOW (ref 3.5–5.0)
Anion gap: 9 (ref 5–15)
BUN: 14 mg/dL (ref 6–20)
CALCIUM: 9.1 mg/dL (ref 8.9–10.3)
CO2: 26 mmol/L (ref 22–32)
CREATININE: 0.86 mg/dL (ref 0.44–1.00)
Chloride: 107 mmol/L (ref 101–111)
Glucose, Bld: 95 mg/dL (ref 65–99)
Potassium: 3.5 mmol/L (ref 3.5–5.1)
Sodium: 142 mmol/L (ref 135–145)
Total Bilirubin: 0.7 mg/dL (ref 0.3–1.2)
Total Protein: 6.1 g/dL — ABNORMAL LOW (ref 6.5–8.1)

## 2016-08-02 LAB — CBC
HCT: 30.2 % — ABNORMAL LOW (ref 36.0–46.0)
Hemoglobin: 10.3 g/dL — ABNORMAL LOW (ref 12.0–15.0)
MCH: 28.9 pg (ref 26.0–34.0)
MCHC: 34.1 g/dL (ref 30.0–36.0)
MCV: 84.6 fL (ref 78.0–100.0)
PLATELETS: 267 10*3/uL (ref 150–400)
RBC: 3.57 MIL/uL — AB (ref 3.87–5.11)
RDW: 13.6 % (ref 11.5–15.5)
WBC: 6.7 10*3/uL (ref 4.0–10.5)

## 2016-08-02 LAB — PHOSPHORUS: Phosphorus: 4.2 mg/dL (ref 2.5–4.6)

## 2016-08-02 LAB — SURGICAL PCR SCREEN
MRSA, PCR: POSITIVE — AB
Staphylococcus aureus: POSITIVE — AB

## 2016-08-02 LAB — TROPONIN I
Troponin I: <0.03 ng/mL (ref <0.03)
Troponin I: <0.03 ng/mL (ref <0.03)

## 2016-08-02 LAB — MAGNESIUM: Magnesium: 1.9 mg/dL (ref 1.7–2.4)

## 2016-08-02 LAB — TSH: TSH: 2.954 u[IU]/mL (ref 0.350–4.500)

## 2016-08-02 MED ORDER — GADOBENATE DIMEGLUMINE 529 MG/ML IV SOLN
20.0000 mL | Freq: Once | INTRAVENOUS | Status: AC | PRN
  Administered 2016-08-02: 20 mL via INTRAVENOUS

## 2016-08-02 MED ORDER — CHLORHEXIDINE GLUCONATE CLOTH 2 % EX PADS
6.0000 | MEDICATED_PAD | Freq: Every day | CUTANEOUS | Status: DC
  Administered 2016-08-03 – 2016-08-05 (×3): 6 via TOPICAL

## 2016-08-02 MED ORDER — HYDROCODONE-ACETAMINOPHEN 5-325 MG PO TABS
1.0000 | ORAL_TABLET | ORAL | Status: DC | PRN
  Administered 2016-08-02 (×2): 2 via ORAL
  Filled 2016-08-02 (×2): qty 2

## 2016-08-02 MED ORDER — MUPIROCIN 2 % EX OINT
1.0000 "application " | TOPICAL_OINTMENT | Freq: Two times a day (BID) | CUTANEOUS | Status: DC
  Administered 2016-08-02 – 2016-08-05 (×5): 1 via NASAL
  Filled 2016-08-02 (×2): qty 22

## 2016-08-02 MED ORDER — DEXAMETHASONE SODIUM PHOSPHATE 4 MG/ML IJ SOLN
4.0000 mg | Freq: Four times a day (QID) | INTRAMUSCULAR | Status: DC
  Administered 2016-08-02 – 2016-08-05 (×11): 4 mg via INTRAVENOUS
  Filled 2016-08-02 (×12): qty 1

## 2016-08-02 NOTE — Progress Notes (Signed)
VSS this shift, patient in continuous pain from legs for which she has received Norco x 3 on this shift.  Patient has signed consent for surgery and is aware she is nothing by mouth after midnight. Surgical PCR pending.   Family visiting with patient at this time.

## 2016-08-02 NOTE — Progress Notes (Addendum)
Patient ID: Briana Miller, female   DOB: Aug 06, 1955, 61 y.o.   MRN: [id number]    PROGRESS NOTE  Briana Miller  AES:[id number] DOB: August 17, 1955 DOA: 08/01/2016  PCP: Lemont Fillers., NP   Brief Narrative:   Pt is 61 yo female with known HTN, combined systolic and diastolic CHF (last ECHO in 2017 with EF 45% and grade II diastolic CHF), morbid obesity, rather chronic lower back and lower extremity pain that initially started back in July 2017 after an episode of fall, several visits to ED for concern of ongoing pain that interferes with her ability to ambulate. Her knees are contracted and she says that has been there for a while as well. Pt was discharged from SNF in October 2017 after being there for 2 months. Per pt, pain in the LE's constant and 10/10 in severity, no specific alleviating factors, associated with weakness and inability to ambulate. In addition, she reports beds sores and incontinence. Pt also reports bilaterally upper extremity pain and weakness, has difficulty controlling her movements. Pt denies chest pain, has occasional exertional dyspnea, no fevers, chills, no specific abd concerns.   Assessment & Plan:   Active Problems: Lower and upper extremity pain and weakness - unclear etiology - received report from radiologist about potential cervical cord compression - discussed with neurosurgeon Dr. Danielle Dess to review images and see if any intervention would benefit the pt - given her poor and progressively worse functional status, no ambulation, morbid obesity, combined systolic and diastolic CHD, suspect pt is rather poor surgical candidate  - PT eval done, pt will definitely need SNF on discharge - continue analgesia as needed   Combined CHF - with LE edema and rather diminished breath sounds at bases - monitor weights, strict I/O - repeat ECHO pending, will need to follow up on results - if EF lower compared to last years, will need cardio to see as well  -  continue Torsemide   HTN - continue Losartan and Metoprolol   Morbid obesity - Body mass index is 42.38 kg/m.   DVT prophylaxis: Lovenox SQ Code Status: Full  Family Communication: Patient at bedside  Disposition Plan: SNF in few days   Consultants:   Neurosurgery   Procedures:   None  Antimicrobials:   None  Subjective: Still with LE and UP weakness and pain.   Objective: Vitals:   08/01/16 1915 08/02/16 0025 08/02/16 0446 08/02/16 0925  BP: (!) 148/75 (!) 127/55 140/65 126/68  Pulse: 79 88 87 92  Resp: 18 18 18 18   Temp: 98.1 F (36.7 C) 98.5 F (36.9 C) 98.2 F (36.8 C) 98.5 F (36.9 C)  TempSrc: Oral Oral Oral Oral  SpO2: 99% 97% 99% 97%  Weight: 117.4 kg (258 lb 14.4 oz)  115.5 kg (254 lb 11.2 oz)   Height: 5\' 5"  (1.651 m)       Intake/Output Summary (Last 24 hours) at 08/02/16 1138 Last data filed at 08/02/16 0900  Gross per 24 hour  Intake              720 ml  Output             1250 ml  Net             -530 ml   Filed Weights   08/01/16 1228 08/01/16 1915 08/02/16 0446  Weight: 124.7 kg (275 lb) 117.4 kg (258 lb 14.4 oz) 115.5 kg (254 lb 11.2 oz)    Examination:  General exam: Appears I mild  distress this AM due to pain.  Respiratory system: Respiratory effort normal. Diminished breath sounds at bases  Cardiovascular system: S1 & S2 heard, RRR. No JVD, murmurs, rubs, gallops or clicks. No pedal edema. Gastrointestinal system: Abdomen is nondistended, soft and nontender. No organomegaly or masses felt. Normal bowel sounds heard. Central nervous system: Alert and oriented. Unable to lift LE's , very minimal movement, sensation intact bilaterally   Data Reviewed: I have personally reviewed following labs and imaging studies  CBC:  Recent Labs Lab 08/01/16 1546 08/02/16 0215  WBC 6.5 6.7  NEUTROABS 3.3  --   HGB 11.3* 10.3*  HCT 32.6* 30.2*  MCV 84.2 84.6  PLT 314 267   Basic Metabolic Panel:  Recent Labs Lab 08/01/16 1546  08/02/16 0215  NA 141 142  K 3.6 3.5  CL 107 107  CO2 26 26  GLUCOSE 112* 95  BUN 15 14  CREATININE 0.87 0.86  CALCIUM 9.1 9.1  MG  --  1.9  PHOS  --  4.2   Liver Function Tests:  Recent Labs Lab 08/01/16 1546 08/02/16 0215  AST 29 26  ALT 27 24  ALKPHOS 63 56  BILITOT 0.6 0.7  PROT 7.1 6.1*  ALBUMIN 3.6 3.3*   Cardiac Enzymes:  Recent Labs Lab 08/01/16 2056 08/02/16 0215  TROPONINI <0.03 <0.03   Thyroid Function Tests:  Recent Labs  08/02/16 0215  TSH 2.954   Urine analysis:    Component Value Date/Time   COLORURINE YELLOW 08/01/2016 1604   APPEARANCEUR CLEAR 08/01/2016 1604   LABSPEC 1.013 08/01/2016 1604   PHURINE 6.0 08/01/2016 1604   GLUCOSEU NEGATIVE 08/01/2016 1604   GLUCOSEU NEGATIVE 09/21/2015 0957   HGBUR NEGATIVE 08/01/2016 1604   BILIRUBINUR NEGATIVE 08/01/2016 1604   BILIRUBINUR negative 06/22/2015 1046   KETONESUR NEGATIVE 08/01/2016 1604   PROTEINUR NEGATIVE 08/01/2016 1604   UROBILINOGEN 0.2 09/21/2015 0957   NITRITE NEGATIVE 08/01/2016 1604   LEUKOCYTESUR NEGATIVE 08/01/2016 1604   Radiology Studies: Dg Chest 2 View  Result Date: 08/01/2016 CLINICAL DATA:  Left groin pain, left leg swelling, weakness EXAM: CHEST  2 VIEW COMPARISON:  04/06/2014 FINDINGS: Cardiomediastinal silhouette is stable. No infiltrate or pleural effusion. No pulmonary edema. Extensive degenerative changes right shoulder. Moderate degenerative changes left shoulder. Osteopenia and mild degenerative changes mid thoracic spine. IMPRESSION: No active cardiopulmonary disease. Degenerative changes bilateral shoulders right greater than left. Electronically Signed   By: Natasha Mead M.D.   On: 08/01/2016 15:58      Scheduled Meds: . enoxaparin (LOVENOX) injection  40 mg Subcutaneous Q24H  . gabapentin  600 mg Oral TID  . losartan  50 mg Oral Daily  . metoprolol succinate  50 mg Oral Daily  . senna  1 tablet Oral BID  . sodium chloride flush  3 mL Intravenous Q12H    . torsemide  10 mg Oral Daily   Continuous Infusions:   LOS: 1 day   Time spent: 20 minutes   Debbora Presto, MD [facility] Pager [phone]  If 7PM-7AM, please contact night-coverage [email or web address] Password [email or web address] 08/02/2016, 11:38 AM

## 2016-08-02 NOTE — Evaluation (Signed)
Physical Therapy Evaluation Patient Details Name: Briana Miller MRN: [id number] DOB: 18-Feb-1956 Today's Date: 08/02/2016   History of Present Illness  Pt is a 61 y/o female admitted secondary to bilateral LE weakness and edema, originally starting in July of 2017 per pt's report. PMH including but not limited to CHF, HTN and R TKA in 2013.  Clinical Impression  Pt presented supine in bed with HOB elevated, awake and willing to participate in therapy session. Prior to admission, pt reported that she used a w/c for mobility and her husband lifted her into and out of the w/c. Pt stated that she was dependent for all ADLs and IADLs. Pt currently requires mod A for bed mobility and tolerated sitting EOB x25 minutes. Pt with significant bilateral knee flexion contractures and non-pitting edema in LEs. Pt would continue to benefit from skilled physical therapy services at this time while admitted and after d/c to address her below listed limitations in order to improve her overall safety and independence with functional mobility.  Pt scheduled for an MRI later this morning.     Follow Up Recommendations SNF    Equipment Recommendations  None recommended by PT    Recommendations for Other Services       Precautions / Restrictions Precautions Precautions: Fall Precaution Comments: pt reported several falls in the last six months Restrictions Weight Bearing Restrictions: No      Mobility  Bed Mobility Overal bed mobility: Needs Assistance Bed Mobility: Supine to Sit;Sit to Supine     Supine to sit: Mod assist Sit to supine: Mod assist   General bed mobility comments: increased time, use of bed rails and mod A at bilateral LEs and trunk to achieve sitting EOB. Mod A at bilateral LEs to return to supine  Transfers                 General transfer comment: deferred standing at this time as pt with bilateral knee contractures. Pt's RN notified and encouraged use of lift  equipment for transfers  Ambulation/Gait                Stairs            Wheelchair Mobility    Modified Rankin (Stroke Patients Only)       Balance Overall balance assessment: Needs assistance;History of Falls Sitting-balance support: Feet supported;No upper extremity supported Sitting balance-Leahy Scale: Good Sitting balance - Comments: pt tolerated sitting EOB for ~25 minutes while feeding herself and performed face washing independently                                     Pertinent Vitals/Pain Pain Assessment: 0-10 Pain Score: 10-Worst pain ever Pain Location: L groin Pain Descriptors / Indicators: Guarding;Sore Pain Intervention(s): Monitored during session;Limited activity within patient's tolerance;Repositioned    Home Living Family/patient expects to be discharged to:: Private residence Living Arrangements: Spouse/significant other;Children Available Help at Discharge: Family;Available PRN/intermittently Type of Home: House Home Access: Ramped entrance     Home Layout: One level Home Equipment: Walker - 2 wheels;Wheelchair - manual      Prior Function Level of Independence: Needs assistance   Gait / Transfers Assistance Needed: pt reported using a w/c or staying in bed all day  ADL's / Homemaking Assistance Needed: pt is dependent for all ADLs, daughter assists her with sponge bath in bed        Hand  Dominance   Dominant Hand: Left    Extremity/Trunk Assessment   Upper Extremity Assessment Upper Extremity Assessment: Generalized weakness    Lower Extremity Assessment Lower Extremity Assessment: RLE deficits/detail;LLE deficits/detail RLE Deficits / Details: Pt with very limited knee ROM, lacking approximately 45 degrees to full extension. Non-pitting edema noted. Pt unable to move LE against gravity or in gravity-eliminated position. LLE Deficits / Details: Pt with very limited knee ROM. Pt with flexion contracture  with knee at 90 degrees of flexion. Non-pitting edema noted. Pt unable to move LE against gravity or in gravity-eliminated position.    Cervical / Trunk Assessment Cervical / Trunk Assessment: Normal  Communication   Communication: No difficulties  Cognition Arousal/Alertness: Awake/alert Behavior During Therapy: WFL for tasks assessed/performed Overall Cognitive Status: Within Functional Limits for tasks assessed                      General Comments General comments (skin integrity, edema, etc.): bilateral LE edema, non-pitting    Exercises     Assessment/Plan    PT Assessment Patient needs continued PT services  PT Problem List Decreased strength;Decreased range of motion;Decreased activity tolerance;Decreased balance;Decreased mobility;Decreased coordination;Decreased knowledge of use of DME;Decreased safety awareness;Pain          PT Treatment Interventions DME instruction;Functional mobility training;Therapeutic activities;Therapeutic exercise;Balance training;Neuromuscular re-education;Patient/family education;Wheelchair mobility training    PT Goals (Current goals can be found in the Care Plan section)  Acute Rehab PT Goals Patient Stated Goal: go to rehab and to walk again PT Goal Formulation: With patient Time For Goal Achievement: 08/16/16 Potential to Achieve Goals: Fair    Frequency Min 2X/week   Barriers to discharge        Co-evaluation               End of Session   Activity Tolerance: Patient limited by pain Patient left: in bed;with call bell/phone within reach Nurse Communication: Mobility status         Time: 0820-0902 PT Time Calculation (min) (ACUTE ONLY): 42 min   Charges:   PT Evaluation $PT Eval Moderate Complexity: 1 Procedure PT Treatments $Therapeutic Activity: 23-37 mins   PT G CodesAlessandra Bevels Karris Deangelo 08/02/2016, 10:13 AM Deborah Chalk, PT, DPT [phone]

## 2016-08-02 NOTE — Progress Notes (Signed)
  Echocardiogram 2D Echocardiogram has been performed.  Briana Miller 08/02/2016, 4:11 PM

## 2016-08-02 NOTE — Consult Note (Signed)
Reason for Consult: Quadraparesis Referring Physician: Dr. Mart Piggs  Briana Miller is an 61 y.o. female.  HPI: Patient is a 61 year old right-handed individual who tells me that she's had weakness in her legs such that she has not been able to walk since October 2017. More recently she's noted weakness in the upper extremities. She was debilitated with her legs for a long period of time such that she went to a nursing home she do transfers at home with assistance but has not been able to ambulate independently for over 4 months time. She notes that that she had pain in both her groins and has had considerable workup of her vascular system. She had an MRI of her lumbar spine. Because of the difficulties with arm weakness recently she underwent an MRI of the cervical spine. This reveals the presence of severe spondylitic stenosis with cord compression at C4-5 and at C5-C6. Consultation was obtained at this time for further evaluation of this process.  Past Medical History:  Diagnosis Date  . Acute systolic congestive heart failure ([place]) 03/01/2014  . Allergy   . Anemia   . Arthritis   . Cardiomyopathy ([place])   . CHF (congestive heart failure) ([place])   . GERD (gastroesophageal reflux disease)   . Heart disease   . Hyperlipidemia   . Hypertension   . Leg pain   . Overactive bladder     Past Surgical History:  Procedure Laterality Date  . COLONOSCOPY    . KNEE ARTHROSCOPY     2007  . ROTATOR CUFF REPAIR Right 03/23/13  . TOTAL KNEE ARTHROPLASTY  09/22/2011   Procedure: TOTAL KNEE ARTHROPLASTY;  Surgeon: Rudean Haskell, MD;  Location: [facility];  Service: Orthopedics;  Laterality: Right;    Family History  Problem Relation Age of Onset  . Cancer Mother     colon 21 and breast 81- deceased  . Colon cancer Mother   . Diabetes Father     living  . Asthma Father   . Hypertension Father   . Heart attack Father 35    medical management per pt  . Glaucoma Father     had eye transplant  .  Kidney failure Father     acute renal failure- died 61  . Esophageal cancer Neg Hx   . Stomach cancer Neg Hx   . Rectal cancer Neg Hx     Social History:  reports that she has never smoked. She has never used smokeless tobacco. She reports that she drinks about 1.2 oz of alcohol per week . She reports that she does not use drugs.  Allergies: No Known Allergies  Medications: I have reviewed the patient's current medications.  Results for orders placed or performed during the hospital encounter of 08/01/16 (from the past 48 hour(s))  Comprehensive metabolic panel     Status: Abnormal   Collection Time: 08/01/16  3:46 PM  Result Value Ref Range   Sodium 141 135 - 145 mmol/L   Potassium 3.6 3.5 - 5.1 mmol/L   Chloride 107 101 - 111 mmol/L   CO2 26 22 - 32 mmol/L   Glucose, Bld 112 (H) 65 - 99 mg/dL   BUN 15 6 - 20 mg/dL   Creatinine, Ser 0.87 0.44 - 1.00 mg/dL   Calcium 9.1 8.9 - 10.3 mg/dL   Total Protein 7.1 6.5 - 8.1 g/dL   Albumin 3.6 3.5 - 5.0 g/dL   AST 29 15 - 41 U/L   ALT 27 14 -  54 U/L   Alkaline Phosphatase 63 38 - 126 U/L   Total Bilirubin 0.6 0.3 - 1.2 mg/dL   GFR calc non Af Amer >60 >60 mL/min   GFR calc Af Amer >60 >60 mL/min    Comment: (NOTE) The eGFR has been calculated using the CKD EPI equation. This calculation has not been validated in all clinical situations. eGFR's persistently <60 mL/min signify possible Chronic Kidney Disease.    Anion gap 8 5 - 15  CBC with Differential     Status: Abnormal   Collection Time: 08/01/16  3:46 PM  Result Value Ref Range   WBC 6.5 4.0 - 10.5 K/uL   RBC 3.87 3.87 - 5.11 MIL/uL   Hemoglobin 11.3 (L) 12.0 - 15.0 g/dL   HCT 32.6 (L) 36.0 - 46.0 %   MCV 84.2 78.0 - 100.0 fL   MCH 29.2 26.0 - 34.0 pg   MCHC 34.7 30.0 - 36.0 g/dL   RDW 13.4 11.5 - 15.5 %   Platelets 314 150 - 400 K/uL   Neutrophils Relative % 51 %   Neutro Abs 3.3 1.7 - 7.7 K/uL   Lymphocytes Relative 27 %   Lymphs Abs 1.8 0.7 - 4.0 K/uL   Monocytes  Relative 14 %   Monocytes Absolute 0.9 0.1 - 1.0 K/uL   Eosinophils Relative 8 %   Eosinophils Absolute 0.5 0.0 - 0.7 K/uL   Basophils Relative 0 %   Basophils Absolute 0.0 0.0 - 0.1 K/uL  Brain natriuretic peptide     Status: Abnormal   Collection Time: 08/01/16  3:46 PM  Result Value Ref Range   B Natriuretic Peptide 112.7 (H) 0.0 - 100.0 pg/mL    Comment: Performed at [facility], [place] [address] [place], [place] [address]  Urinalysis, Routine w reflex microscopic     Status: None   Collection Time: 08/01/16  4:04 PM  Result Value Ref Range   Color, Urine YELLOW YELLOW   APPearance CLEAR CLEAR   Specific Gravity, Urine 1.013 1.005 - 1.030   pH 6.0 5.0 - 8.0   Glucose, UA NEGATIVE NEGATIVE mg/dL   Hgb urine dipstick NEGATIVE NEGATIVE   Bilirubin Urine NEGATIVE NEGATIVE   Ketones, ur NEGATIVE NEGATIVE mg/dL   Protein, ur NEGATIVE NEGATIVE mg/dL   Nitrite NEGATIVE NEGATIVE   Leukocytes, UA NEGATIVE NEGATIVE    Comment: Microscopic not done on urines with negative protein, blood, leukocytes, nitrite, or glucose < 500 mg/dL.  I-Stat Troponin, ED (not at [facility])     Status: None   Collection Time: 08/01/16  4:22 PM  Result Value Ref Range   Troponin i, poc 0.01 0.00 - 0.08 ng/mL   Comment 3            Comment: Due to the release kinetics of cTnI, a negative result within the first hours of the onset of symptoms does not rule out myocardial infarction with certainty. If myocardial infarction is still suspected, repeat the test at appropriate intervals.   Troponin I     Status: None   Collection Time: 08/01/16  8:56 PM  Result Value Ref Range   Troponin I <0.03 <0.03 ng/mL  Magnesium     Status: None   Collection Time: 08/02/16  2:15 AM  Result Value Ref Range   Magnesium 1.9 1.7 - 2.4 mg/dL  Phosphorus     Status: None   Collection Time: 08/02/16  2:15 AM  Result Value Ref Range   Phosphorus 4.2 2.5 -  4.6 mg/dL  TSH     Status: None   Collection Time: 08/02/16  2:15  AM  Result Value Ref Range   TSH 2.954 0.350 - 4.500 uIU/mL    Comment: Performed by a 3rd Generation assay with a functional sensitivity of <=0.01 uIU/mL.  Comprehensive metabolic panel     Status: Abnormal   Collection Time: 08/02/16  2:15 AM  Result Value Ref Range   Sodium 142 135 - 145 mmol/L   Potassium 3.5 3.5 - 5.1 mmol/L   Chloride 107 101 - 111 mmol/L   CO2 26 22 - 32 mmol/L   Glucose, Bld 95 65 - 99 mg/dL   BUN 14 6 - 20 mg/dL   Creatinine, Ser 0.86 0.44 - 1.00 mg/dL   Calcium 9.1 8.9 - 10.3 mg/dL   Total Protein 6.1 (L) 6.5 - 8.1 g/dL   Albumin 3.3 (L) 3.5 - 5.0 g/dL   AST 26 15 - 41 U/L   ALT 24 14 - 54 U/L   Alkaline Phosphatase 56 38 - 126 U/L   Total Bilirubin 0.7 0.3 - 1.2 mg/dL   GFR calc non Af Amer >60 >60 mL/min   GFR calc Af Amer >60 >60 mL/min    Comment: (NOTE) The eGFR has been calculated using the CKD EPI equation. This calculation has not been validated in all clinical situations. eGFR's persistently <60 mL/min signify possible Chronic Kidney Disease.    Anion gap 9 5 - 15  CBC     Status: Abnormal   Collection Time: 08/02/16  2:15 AM  Result Value Ref Range   WBC 6.7 4.0 - 10.5 K/uL   RBC 3.57 (L) 3.87 - 5.11 MIL/uL   Hemoglobin 10.3 (L) 12.0 - 15.0 g/dL   HCT 30.2 (L) 36.0 - 46.0 %   MCV 84.6 78.0 - 100.0 fL   MCH 28.9 26.0 - 34.0 pg   MCHC 34.1 30.0 - 36.0 g/dL   RDW 13.6 11.5 - 15.5 %   Platelets 267 150 - 400 K/uL  Troponin I     Status: None   Collection Time: 08/02/16  2:15 AM  Result Value Ref Range   Troponin I <0.03 <0.03 ng/mL  Troponin I     Status: None   Collection Time: 08/02/16  2:17 PM  Result Value Ref Range   Troponin I <0.03 <0.03 ng/mL    Dg Chest 2 View  Result Date: 08/01/2016 CLINICAL DATA:  Left groin pain, left leg swelling, weakness EXAM: CHEST  2 VIEW COMPARISON:  04/06/2014 FINDINGS: Cardiomediastinal silhouette is stable. No infiltrate or pleural effusion. No pulmonary edema. Extensive degenerative changes  right shoulder. Moderate degenerative changes left shoulder. Osteopenia and mild degenerative changes mid thoracic spine. IMPRESSION: No active cardiopulmonary disease. Degenerative changes bilateral shoulders right greater than left. Electronically Signed   By: Lahoma Crocker M.D.   On: 08/01/2016 15:58   Mr Cervical Spine W Wo Contrast  Result Date: 08/02/2016 CLINICAL DATA:  Profound lower extremity weakness. EXAM: MRI TOTAL SPINE WITHOUT AND WITH CONTRAST TECHNIQUE: Multisequence multi planar MR imaging of the spine from the cervical spine to the sacrum was performed prior to and following IV contrast administration. CONTRAST:  69m MULTIHANCE GADOBENATE DIMEGLUMINE 529 MG/ML IV SOLN COMPARISON:  None. FINDINGS: MRI CERVICAL SPINE FINDINGS Alignment: Straightening without subluxation. Vertebrae: Altered marrow signal around the C4-5 and C5-6 degenerated endplates. Diffusely hypointense marrow without focal lesion. Cord: Degenerative compression at C4-5 with ill-defined T2 hyperintensity -edematous appearance. Posterior Fossa, vertebral  arteries, paraspinal tissues: No acute finding. Disc levels: C2-3: Facet spurring on the right, mild.  No impingement C3-4: Asymmetric rightward endplate and uncovertebral ridging. Mild right facet spurring. No impingement C4-5: Disc degeneration with posterior disc osteophyte complex, predominately disc. There may be a superimposed broad disc protrusion. Right more than left uncovertebral spurring. Spinal stenosis with cord deformity and T2 hyperintensity. Biforaminal impingement, greater on the right. C5-6: Degenerative disc disease with disc osteophyte complex and uncovertebral ridging. Spinal stenosis is moderate. Small dorsal epidural T2 hyperintensity on sagittal acquisition of the level of C5 has no associated inflammatory changes or rim enhancement on postcontrast imaging. On the midline slice with the ligamentum flavum has a deficient appearance, but para median ligament  is intact and unremarkable. There is no historical indication of trauma. C6-7: No impingement C7-T1:Unremarkable. MRI THORACIC SPINE FINDINGS Alignment:  Unremarkable Vertebrae: Diffusely hypointense marrow. Negative for acute fracture, discitis, or aggressive bone lesion. Cord:  Normal signal and morphology. Paraspinal and other soft tissues: Small area of signal abnormality in the deep dependent right lung is presumably atelectasis in this setting. Disc levels: Diffuse mild disc bulging.  No impingement or notable herniation. MRI LUMBAR SPINE FINDINGS Segmentation:  Standard. Alignment:  Facet mediated mild grade 1 anterolisthesis at L4-5. Vertebrae: Diffusely hypointense marrow. Negative for acute fracture, discitis, or aggressive bone lesion. Conus medullaris: Extends to the L1 level and appears normal. Paraspinal and other soft tissues: Diffuse subcutaneous edema. Disc levels: T12- L1: Unremarkable. L1-L2: Mild facet spurring.  No herniation or impingement L2-L3: Facet arthropathy with moderate posterior element overgrowth. Mild disc bulging. No impingement L3-L4: Prominent facet arthropathy with spurring and left-sided joint effusion. Mild disc bulging. No compressive stenosis. L4-L5: Advanced facet arthropathy with slight anterolisthesis. Bilateral joint effusion. Disc bulging. Inferior foraminal narrowing greater on the right without L4 compression. Moderate spinal stenosis. L5-S1:Facet arthropathy with mild to moderate spurring. No herniation or impingement. These results were called by telephone at the time of interpretation on 08/02/2016 at 1:23 pm to Dr. Doyle Askew who verbally acknowledged these results. Diffusely motion degraded MRI which could affect sensitivity of small or subtle findings. IMPRESSION: 1. C4-5 and C5-6 spinal stenosis from disc degeneration. At C4-5 there is cord flattening and cord edema. 2. C4-5 advanced right foraminal stenosis. 3. No impingement or signs of myelopathy in the thoracic  spine. 4. Lumbar spine shows prominent facet arthropathy, especially at L3-4 and L4-5. Moderate spinal stenosis at L4-5. 5. Diffusely hypointense marrow, often from anemia or chronic hypoxemia. Correlate with CBC. 6. Motion degraded exam. Electronically Signed   By: Monte Fantasia M.D.   On: 08/02/2016 13:30   Mr Thoracic Spine W Wo Contrast  Result Date: 08/02/2016 CLINICAL DATA:  Profound lower extremity weakness. EXAM: MRI TOTAL SPINE WITHOUT AND WITH CONTRAST TECHNIQUE: Multisequence multi planar MR imaging of the spine from the cervical spine to the sacrum was performed prior to and following IV contrast administration. CONTRAST:  28m MULTIHANCE GADOBENATE DIMEGLUMINE 529 MG/ML IV SOLN COMPARISON:  None. FINDINGS: MRI CERVICAL SPINE FINDINGS Alignment: Straightening without subluxation. Vertebrae: Altered marrow signal around the C4-5 and C5-6 degenerated endplates. Diffusely hypointense marrow without focal lesion. Cord: Degenerative compression at C4-5 with ill-defined T2 hyperintensity -edematous appearance. Posterior Fossa, vertebral arteries, paraspinal tissues: No acute finding. Disc levels: C2-3: Facet spurring on the right, mild.  No impingement C3-4: Asymmetric rightward endplate and uncovertebral ridging. Mild right facet spurring. No impingement C4-5: Disc degeneration with posterior disc osteophyte complex, predominately disc. There may be a superimposed  broad disc protrusion. Right more than left uncovertebral spurring. Spinal stenosis with cord deformity and T2 hyperintensity. Biforaminal impingement, greater on the right. C5-6: Degenerative disc disease with disc osteophyte complex and uncovertebral ridging. Spinal stenosis is moderate. Small dorsal epidural T2 hyperintensity on sagittal acquisition of the level of C5 has no associated inflammatory changes or rim enhancement on postcontrast imaging. On the midline slice with the ligamentum flavum has a deficient appearance, but para median  ligament is intact and unremarkable. There is no historical indication of trauma. C6-7: No impingement C7-T1:Unremarkable. MRI THORACIC SPINE FINDINGS Alignment:  Unremarkable Vertebrae: Diffusely hypointense marrow. Negative for acute fracture, discitis, or aggressive bone lesion. Cord:  Normal signal and morphology. Paraspinal and other soft tissues: Small area of signal abnormality in the deep dependent right lung is presumably atelectasis in this setting. Disc levels: Diffuse mild disc bulging.  No impingement or notable herniation. MRI LUMBAR SPINE FINDINGS Segmentation:  Standard. Alignment:  Facet mediated mild grade 1 anterolisthesis at L4-5. Vertebrae: Diffusely hypointense marrow. Negative for acute fracture, discitis, or aggressive bone lesion. Conus medullaris: Extends to the L1 level and appears normal. Paraspinal and other soft tissues: Diffuse subcutaneous edema. Disc levels: T12- L1: Unremarkable. L1-L2: Mild facet spurring.  No herniation or impingement L2-L3: Facet arthropathy with moderate posterior element overgrowth. Mild disc bulging. No impingement L3-L4: Prominent facet arthropathy with spurring and left-sided joint effusion. Mild disc bulging. No compressive stenosis. L4-L5: Advanced facet arthropathy with slight anterolisthesis. Bilateral joint effusion. Disc bulging. Inferior foraminal narrowing greater on the right without L4 compression. Moderate spinal stenosis. L5-S1:Facet arthropathy with mild to moderate spurring. No herniation or impingement. These results were called by telephone at the time of interpretation on 08/02/2016 at 1:23 pm to Dr. Doyle Askew who verbally acknowledged these results. Diffusely motion degraded MRI which could affect sensitivity of small or subtle findings. IMPRESSION: 1. C4-5 and C5-6 spinal stenosis from disc degeneration. At C4-5 there is cord flattening and cord edema. 2. C4-5 advanced right foraminal stenosis. 3. No impingement or signs of myelopathy in the  thoracic spine. 4. Lumbar spine shows prominent facet arthropathy, especially at L3-4 and L4-5. Moderate spinal stenosis at L4-5. 5. Diffusely hypointense marrow, often from anemia or chronic hypoxemia. Correlate with CBC. 6. Motion degraded exam. Electronically Signed   By: Monte Fantasia M.D.   On: 08/02/2016 13:30   Mr Lumbar Spine W Wo Contrast  Result Date: 08/02/2016 CLINICAL DATA:  Profound lower extremity weakness. EXAM: MRI TOTAL SPINE WITHOUT AND WITH CONTRAST TECHNIQUE: Multisequence multi planar MR imaging of the spine from the cervical spine to the sacrum was performed prior to and following IV contrast administration. CONTRAST:  52m MULTIHANCE GADOBENATE DIMEGLUMINE 529 MG/ML IV SOLN COMPARISON:  None. FINDINGS: MRI CERVICAL SPINE FINDINGS Alignment: Straightening without subluxation. Vertebrae: Altered marrow signal around the C4-5 and C5-6 degenerated endplates. Diffusely hypointense marrow without focal lesion. Cord: Degenerative compression at C4-5 with ill-defined T2 hyperintensity -edematous appearance. Posterior Fossa, vertebral arteries, paraspinal tissues: No acute finding. Disc levels: C2-3: Facet spurring on the right, mild.  No impingement C3-4: Asymmetric rightward endplate and uncovertebral ridging. Mild right facet spurring. No impingement C4-5: Disc degeneration with posterior disc osteophyte complex, predominately disc. There may be a superimposed broad disc protrusion. Right more than left uncovertebral spurring. Spinal stenosis with cord deformity and T2 hyperintensity. Biforaminal impingement, greater on the right. C5-6: Degenerative disc disease with disc osteophyte complex and uncovertebral ridging. Spinal stenosis is moderate. Small dorsal epidural T2 hyperintensity on sagittal acquisition  of the level of C5 has no associated inflammatory changes or rim enhancement on postcontrast imaging. On the midline slice with the ligamentum flavum has a deficient appearance, but para  median ligament is intact and unremarkable. There is no historical indication of trauma. C6-7: No impingement C7-T1:Unremarkable. MRI THORACIC SPINE FINDINGS Alignment:  Unremarkable Vertebrae: Diffusely hypointense marrow. Negative for acute fracture, discitis, or aggressive bone lesion. Cord:  Normal signal and morphology. Paraspinal and other soft tissues: Small area of signal abnormality in the deep dependent right lung is presumably atelectasis in this setting. Disc levels: Diffuse mild disc bulging.  No impingement or notable herniation. MRI LUMBAR SPINE FINDINGS Segmentation:  Standard. Alignment:  Facet mediated mild grade 1 anterolisthesis at L4-5. Vertebrae: Diffusely hypointense marrow. Negative for acute fracture, discitis, or aggressive bone lesion. Conus medullaris: Extends to the L1 level and appears normal. Paraspinal and other soft tissues: Diffuse subcutaneous edema. Disc levels: T12- L1: Unremarkable. L1-L2: Mild facet spurring.  No herniation or impingement L2-L3: Facet arthropathy with moderate posterior element overgrowth. Mild disc bulging. No impingement L3-L4: Prominent facet arthropathy with spurring and left-sided joint effusion. Mild disc bulging. No compressive stenosis. L4-L5: Advanced facet arthropathy with slight anterolisthesis. Bilateral joint effusion. Disc bulging. Inferior foraminal narrowing greater on the right without L4 compression. Moderate spinal stenosis. L5-S1:Facet arthropathy with mild to moderate spurring. No herniation or impingement. These results were called by telephone at the time of interpretation on 08/02/2016 at 1:23 pm to Dr. Doyle Askew who verbally acknowledged these results. Diffusely motion degraded MRI which could affect sensitivity of small or subtle findings. IMPRESSION: 1. C4-5 and C5-6 spinal stenosis from disc degeneration. At C4-5 there is cord flattening and cord edema. 2. C4-5 advanced right foraminal stenosis. 3. No impingement or signs of myelopathy in  the thoracic spine. 4. Lumbar spine shows prominent facet arthropathy, especially at L3-4 and L4-5. Moderate spinal stenosis at L4-5. 5. Diffusely hypointense marrow, often from anemia or chronic hypoxemia. Correlate with CBC. 6. Motion degraded exam. Electronically Signed   By: Monte Fantasia M.D.   On: 08/02/2016 13:30   Dg Hips Bilat With Pelvis 2v  Result Date: 08/02/2016 CLINICAL DATA:  Weakness and BILATERAL lower extremity edema since July 2017, LEFT groin pain and swelling, BILATERAL knee contractures, history CHF, cardiomyopathy, overactive bladder, heart failure EXAM: DG HIP (WITH OR WITHOUT PELVIS) 2V BILAT COMPARISON:  None FINDINGS: Mild osseous demineralization. Hip and SI joint spaces preserved. No acute fracture, dislocation, or bone destruction. IMPRESSION: No acute osseous abnormalities. Electronically Signed   By: Lavonia Dana M.D.   On: 08/02/2016 13:43    Review of Systems  HENT: Negative.   Eyes: Negative.   Respiratory: Negative.   Cardiovascular: Negative.   Gastrointestinal: Negative.   Genitourinary: Negative.   Musculoskeletal: Positive for back pain and neck pain.  Skin: Negative.   Neurological: Positive for tingling, focal weakness and weakness.  Endo/Heme/Allergies: Negative.   Psychiatric/Behavioral: Negative.    Blood pressure 126/68, pulse 92, temperature 98.5 F (36.9 C), temperature source Oral, resp. rate 18, height '5\' 5"'  (1.651 m), weight 115.5 kg (254 lb 11.2 oz), last menstrual period 06/17/2007, SpO2 97 %. Physical Exam  Constitutional: She is oriented to person, place, and time. She appears well-developed and well-nourished.  HENT:  Head: Normocephalic and atraumatic.  Eyes: Conjunctivae and EOM are normal. Pupils are equal, round, and reactive to light.  Neck: Normal range of motion. Neck supple.  Cardiovascular: Normal rate and regular rhythm.   Respiratory: Effort normal  and breath sounds normal.  GI: Soft. Bowel sounds are normal.   Neurological: She is alert and oriented to person, place, and time.  Absent deep tendon reflexes in the upper extremities 2+ reflexes in the patellae and trace reflexes in the Achilles Babinskis are equivocal. It sensory examination reveals diminished pin sensation in the distal upper extremities in the hands and forearms bilaterally. Sensation in lower extremities is intact. Motor strength in the upper extremities reveals 4-5 strength in the deltoid bicep and grip strength is decreased to 3 out of 5 intrinsic strength is 3 out of 5 triceps strength is 3 out of 5 in the lower extremities proximal lower extremity strength is 3+ out of 5 in iliopsoas and quadriceps 2 out of 5 strength in the dorsi flexors 2 out of 5 strength in the plantar flexors. Tone is increased in lower extremities.  Cranial nerve examination is within the limits of normal    Assessment/Plan: Patient has severe spondylitic myelopathy with cord compression at C4-5 and C5-6. She needs to undergo surgical decompression of her spinal cord at both of these levels. I discussed the nature of the surgery with the patient and explained to her what to level anterior decompression arthrodesis involves. Because of the profound weakness that she is experiencing will try to do this as urgently as possible probably first thing in the morning.  Plan: Anterior decompression arthrodesis C4-5 and C5-6. Will obtain a plain x-ray today. I discussed the situation with Dr. Mart Piggs and she will start the patient on Decadron.  Kaena Santori J 08/02/2016, 4:40 PM

## 2016-08-03 ENCOUNTER — Encounter (HOSPITAL_COMMUNITY)
Admission: EM | Disposition: A | Discharge status: Skilled Nursing Facility | Payer: Self-pay | Source: Home / Self Care | Attending: Internal Medicine

## 2016-08-03 ENCOUNTER — Inpatient Hospital Stay (HOSPITAL_COMMUNITY): Payer: BLUE CROSS/BLUE SHIELD

## 2016-08-03 ENCOUNTER — Inpatient Hospital Stay (HOSPITAL_COMMUNITY): Payer: BLUE CROSS/BLUE SHIELD | Admitting: Certified Registered Nurse Anesthetist

## 2016-08-03 DIAGNOSIS — G959 Disease of spinal cord, unspecified: Secondary | ICD-10-CM

## 2016-08-03 HISTORY — PX: ANTERIOR CERVICAL DECOMP/DISCECTOMY FUSION: SHX1161

## 2016-08-03 LAB — BASIC METABOLIC PANEL
ANION GAP: 10 (ref 5–15)
BUN: 16 mg/dL (ref 6–20)
CALCIUM: 9.2 mg/dL (ref 8.9–10.3)
CO2: 23 mmol/L (ref 22–32)
CREATININE: 0.82 mg/dL (ref 0.44–1.00)
Chloride: 103 mmol/L (ref 101–111)
GFR calc Af Amer: >60 mL/min (ref >60)
GLUCOSE: 208 mg/dL — AB (ref 65–99)
Potassium: 3.9 mmol/L (ref 3.5–5.1)
Sodium: 136 mmol/L (ref 135–145)

## 2016-08-03 LAB — CBC
HCT: 32.2 % — ABNORMAL LOW (ref 36.0–46.0)
Hemoglobin: 11.1 g/dL — ABNORMAL LOW (ref 12.0–15.0)
MCH: 29.2 pg (ref 26.0–34.0)
MCHC: 34.5 g/dL (ref 30.0–36.0)
MCV: 84.7 fL (ref 78.0–100.0)
PLATELETS: 281 10*3/uL (ref 150–400)
RBC: 3.8 MIL/uL — ABNORMAL LOW (ref 3.87–5.11)
RDW: 14 % (ref 11.5–15.5)
WBC: 5.2 10*3/uL (ref 4.0–10.5)

## 2016-08-03 LAB — HEMOGLOBIN A1C
HEMOGLOBIN A1C: 5.9 % — AB (ref 4.8–5.6)
Mean Plasma Glucose: 123 mg/dL

## 2016-08-03 LAB — HIV ANTIBODY (ROUTINE TESTING W REFLEX): HIV SCREEN 4TH GENERATION: NONREACTIVE

## 2016-08-03 SURGERY — ANTERIOR CERVICAL DECOMPRESSION/DISCECTOMY FUSION 2 LEVELS
Anesthesia: General | Site: Neck

## 2016-08-03 MED ORDER — THROMBIN 5000 UNITS EX SOLR
OROMUCOSAL | Status: DC | PRN
  Administered 2016-08-03: 5 mL via TOPICAL

## 2016-08-03 MED ORDER — SUCCINYLCHOLINE CHLORIDE 200 MG/10ML IV SOSY
PREFILLED_SYRINGE | INTRAVENOUS | Status: AC
  Filled 2016-08-03: qty 10

## 2016-08-03 MED ORDER — CEFAZOLIN SODIUM-DEXTROSE 2-3 GM-% IV SOLR
INTRAVENOUS | Status: DC | PRN
  Administered 2016-08-03: 2 g via INTRAVENOUS

## 2016-08-03 MED ORDER — BUPIVACAINE HCL (PF) 0.5 % IJ SOLN
INTRAMUSCULAR | Status: DC | PRN
  Administered 2016-08-03: 4 mL

## 2016-08-03 MED ORDER — SODIUM CHLORIDE 0.9 % IR SOLN
Status: DC | PRN
  Administered 2016-08-03: 500 mL

## 2016-08-03 MED ORDER — 0.9 % SODIUM CHLORIDE (POUR BTL) OPTIME
TOPICAL | Status: DC | PRN
  Administered 2016-08-03: 1000 mL

## 2016-08-03 MED ORDER — LIDOCAINE 2% (20 MG/ML) 5 ML SYRINGE
INTRAMUSCULAR | Status: AC
  Filled 2016-08-03: qty 5

## 2016-08-03 MED ORDER — PROPOFOL 10 MG/ML IV BOLUS
INTRAVENOUS | Status: AC
  Filled 2016-08-03: qty 20

## 2016-08-03 MED ORDER — LIDOCAINE-EPINEPHRINE (PF) 2 %-1:200000 IJ SOLN
INTRAMUSCULAR | Status: DC | PRN
  Administered 2016-08-03: 4 mL via INTRADERMAL

## 2016-08-03 MED ORDER — ACETAMINOPHEN 650 MG RE SUPP: 650.0000 mg | RECTAL | Status: DC | PRN

## 2016-08-03 MED ORDER — HYDROMORPHONE HCL 1 MG/ML IJ SOLN: 1.0000 mg | INTRAMUSCULAR | Status: DC | PRN

## 2016-08-03 MED ORDER — FENTANYL CITRATE (PF) 100 MCG/2ML IJ SOLN
INTRAMUSCULAR | Status: DC | PRN
  Administered 2016-08-03 (×2): 100 ug via INTRAVENOUS

## 2016-08-03 MED ORDER — ONDANSETRON HCL 4 MG/2ML IJ SOLN
INTRAMUSCULAR | Status: AC
  Filled 2016-08-03: qty 2

## 2016-08-03 MED ORDER — HYDROCODONE-ACETAMINOPHEN 5-325 MG PO TABS
1.0000 | ORAL_TABLET | ORAL | Status: DC | PRN
  Administered 2016-08-03 (×2): 1 via ORAL
  Administered 2016-08-04 – 2016-08-05 (×2): 2 via ORAL
  Filled 2016-08-03 (×2): qty 1
  Filled 2016-08-03 (×2): qty 2

## 2016-08-03 MED ORDER — ONDANSETRON HCL 4 MG/2ML IJ SOLN: 4.0000 mg | INTRAMUSCULAR | Status: DC | PRN

## 2016-08-03 MED ORDER — LACTATED RINGERS IV SOLN
INTRAVENOUS | Status: DC | PRN
  Administered 2016-08-03 (×2): via INTRAVENOUS

## 2016-08-03 MED ORDER — LIDOCAINE-EPINEPHRINE (PF) 2 %-1:200000 IJ SOLN
INTRAMUSCULAR | Status: AC
  Filled 2016-08-03: qty 20

## 2016-08-03 MED ORDER — METHOCARBAMOL 500 MG PO TABS: 500.0000 mg | ORAL_TABLET | Freq: Four times a day (QID) | ORAL | Status: DC | PRN

## 2016-08-03 MED ORDER — HYDROCODONE-ACETAMINOPHEN 7.5-325 MG PO TABS
1.0000 | ORAL_TABLET | Freq: Four times a day (QID) | ORAL | Status: DC
Start: 2016-08-03 — End: 2016-08-05
  Administered 2016-08-03 – 2016-08-05 (×10): 1 via ORAL
  Filled 2016-08-03 (×10): qty 1

## 2016-08-03 MED ORDER — SODIUM CHLORIDE 0.9 % IV SOLN: 250.0000 mL | INTRAVENOUS | Status: DC

## 2016-08-03 MED ORDER — THROMBIN 5000 UNITS EX SOLR
CUTANEOUS | Status: AC
  Filled 2016-08-03: qty 5000

## 2016-08-03 MED ORDER — HYDROMORPHONE HCL 1 MG/ML IJ SOLN: 0.2500 mg | INTRAMUSCULAR | Status: DC | PRN

## 2016-08-03 MED ORDER — MIDAZOLAM HCL 2 MG/2ML IJ SOLN
INTRAMUSCULAR | Status: AC
  Filled 2016-08-03: qty 2

## 2016-08-03 MED ORDER — PHENOL 1.4 % MT LIQD: 1.0000 | OROMUCOSAL | Status: DC | PRN

## 2016-08-03 MED ORDER — METHOCARBAMOL 1000 MG/10ML IJ SOLN
500.0000 mg | Freq: Four times a day (QID) | INTRAMUSCULAR | Status: DC | PRN
  Filled 2016-08-03: qty 5

## 2016-08-03 MED ORDER — CEFAZOLIN SODIUM-DEXTROSE 2-4 GM/100ML-% IV SOLN
INTRAVENOUS | Status: AC
  Filled 2016-08-03: qty 100

## 2016-08-03 MED ORDER — LIDOCAINE 2% (20 MG/ML) 5 ML SYRINGE
INTRAMUSCULAR | Status: DC | PRN
  Administered 2016-08-03: 60 mg via INTRAVENOUS

## 2016-08-03 MED ORDER — BISACODYL 10 MG RE SUPP: 10.0000 mg | Freq: Every day | RECTAL | Status: DC | PRN

## 2016-08-03 MED ORDER — PHENYLEPHRINE HCL 10 MG/ML IJ SOLN
INTRAVENOUS | Status: DC | PRN
  Administered 2016-08-03: 40 ug/min via INTRAVENOUS

## 2016-08-03 MED ORDER — BUPIVACAINE HCL (PF) 0.5 % IJ SOLN
INTRAMUSCULAR | Status: AC
  Filled 2016-08-03: qty 30

## 2016-08-03 MED ORDER — ACETAMINOPHEN 650 MG RE SUPP: 650.0000 mg | Freq: Four times a day (QID) | RECTAL | Status: DC | PRN

## 2016-08-03 MED ORDER — FENTANYL CITRATE (PF) 100 MCG/2ML IJ SOLN
INTRAMUSCULAR | Status: AC
  Filled 2016-08-03: qty 4

## 2016-08-03 MED ORDER — SUGAMMADEX SODIUM 200 MG/2ML IV SOLN
INTRAVENOUS | Status: DC | PRN
  Administered 2016-08-03: 230 mg via INTRAVENOUS

## 2016-08-03 MED ORDER — SODIUM CHLORIDE 0.9% FLUSH
3.0000 mL | Freq: Two times a day (BID) | INTRAVENOUS | Status: DC
  Administered 2016-08-03 – 2016-08-05 (×5): 3 mL via INTRAVENOUS

## 2016-08-03 MED ORDER — ACETAMINOPHEN 325 MG PO TABS: 650.0000 mg | ORAL_TABLET | Freq: Four times a day (QID) | ORAL | Status: DC | PRN

## 2016-08-03 MED ORDER — ROCURONIUM BROMIDE 50 MG/5ML IV SOSY
PREFILLED_SYRINGE | INTRAVENOUS | Status: AC
  Filled 2016-08-03: qty 10

## 2016-08-03 MED ORDER — SUCCINYLCHOLINE 20MG/ML (10ML) SYRINGE FOR MEDFUSION PUMP - OPTIME
INTRAMUSCULAR | Status: DC | PRN
  Administered 2016-08-03: 100 mg via INTRAVENOUS

## 2016-08-03 MED ORDER — POLYETHYLENE GLYCOL 3350 17 G PO PACK: 17.0000 g | PACK | Freq: Every day | ORAL | Status: DC | PRN

## 2016-08-03 MED ORDER — CEFAZOLIN SODIUM-DEXTROSE 2-4 GM/100ML-% IV SOLN
2.0000 g | Freq: Three times a day (TID) | INTRAVENOUS | Status: AC
  Administered 2016-08-03 – 2016-08-04 (×2): 2 g via INTRAVENOUS
  Filled 2016-08-03 (×2): qty 100

## 2016-08-03 MED ORDER — MIDAZOLAM HCL 2 MG/2ML IJ SOLN
INTRAMUSCULAR | Status: DC | PRN
  Administered 2016-08-03: 2 mg via INTRAVENOUS

## 2016-08-03 MED ORDER — SUGAMMADEX SODIUM 200 MG/2ML IV SOLN
INTRAVENOUS | Status: AC
  Filled 2016-08-03: qty 4

## 2016-08-03 MED ORDER — MENTHOL 3 MG MT LOZG
1.0000 | LOZENGE | OROMUCOSAL | Status: DC | PRN
  Filled 2016-08-03: qty 9

## 2016-08-03 MED ORDER — DEXAMETHASONE SODIUM PHOSPHATE 10 MG/ML IJ SOLN
INTRAMUSCULAR | Status: DC | PRN
  Administered 2016-08-03: 10 mg via INTRAVENOUS

## 2016-08-03 MED ORDER — DEXAMETHASONE SODIUM PHOSPHATE 10 MG/ML IJ SOLN
INTRAMUSCULAR | Status: AC
  Filled 2016-08-03: qty 1

## 2016-08-03 MED ORDER — PROPOFOL 10 MG/ML IV BOLUS
INTRAVENOUS | Status: DC | PRN
  Administered 2016-08-03: 150 mg via INTRAVENOUS

## 2016-08-03 MED ORDER — SODIUM CHLORIDE 0.9% FLUSH: 3.0000 mL | INTRAVENOUS | Status: DC | PRN

## 2016-08-03 MED ORDER — ROCURONIUM BROMIDE 100 MG/10ML IV SOLN
INTRAVENOUS | Status: DC | PRN
  Administered 2016-08-03: 20 mg via INTRAVENOUS
  Administered 2016-08-03: 50 mg via INTRAVENOUS

## 2016-08-03 MED ORDER — ACETAMINOPHEN 325 MG PO TABS: 650.0000 mg | ORAL_TABLET | ORAL | Status: DC | PRN

## 2016-08-03 SURGICAL SUPPLY — 55 items
ALLOGRAFT TRIAD LORDOTIC CC (Bone Implant) ×4 IMPLANT
BAG DECANTER FOR FLEXI CONT (MISCELLANEOUS) ×2 IMPLANT
BIT DRILL NEURO 2X3.1 SFT TUCH (MISCELLANEOUS) ×1 IMPLANT
BIT DRILL POWER (BIT) ×1 IMPLANT
BNDG GAUZE ELAST 4 BULKY (GAUZE/BANDAGES/DRESSINGS) IMPLANT
BUR BARREL STRAIGHT FLUTE 4.0 (BURR) ×2 IMPLANT
CANISTER SUCT 3000ML PPV (MISCELLANEOUS) ×2 IMPLANT
CARTRIDGE OIL MAESTRO DRILL (MISCELLANEOUS) IMPLANT
DECANTER SPIKE VIAL GLASS SM (MISCELLANEOUS) ×2 IMPLANT
DERMABOND ADVANCED (GAUZE/BANDAGES/DRESSINGS) ×1
DERMABOND ADVANCED .7 DNX12 (GAUZE/BANDAGES/DRESSINGS) ×1 IMPLANT
DIFFUSER DRILL AIR PNEUMATIC (MISCELLANEOUS) IMPLANT
DRAPE LAPAROTOMY 100X72 PEDS (DRAPES) ×2 IMPLANT
DRAPE MICROSCOPE LEICA (MISCELLANEOUS) IMPLANT
DRAPE POUCH INSTRU U-SHP 10X18 (DRAPES) ×2 IMPLANT
DRILL BIT POWER (BIT) ×1
DRILL NEURO 2X3.1 SOFT TOUCH (MISCELLANEOUS) ×2
DURAPREP 6ML APPLICATOR 50/CS (WOUND CARE) ×2 IMPLANT
ELECT REM PT RETURN 9FT ADLT (ELECTROSURGICAL) ×2
ELECTRODE REM PT RTRN 9FT ADLT (ELECTROSURGICAL) ×1 IMPLANT
GAUZE SPONGE 4X4 12PLY STRL (GAUZE/BANDAGES/DRESSINGS) ×2 IMPLANT
GAUZE SPONGE 4X4 16PLY XRAY LF (GAUZE/BANDAGES/DRESSINGS) IMPLANT
GLOVE BIO SURGEON STRL SZ 6.5 (GLOVE) ×2 IMPLANT
GLOVE BIO SURGEON STRL SZ7 (GLOVE) ×2 IMPLANT
GLOVE BIO SURGEON STRL SZ8 (GLOVE) ×2 IMPLANT
GLOVE BIOGEL PI IND STRL 8.5 (GLOVE) ×2 IMPLANT
GLOVE BIOGEL PI INDICATOR 8.5 (GLOVE) ×2
GLOVE ECLIPSE 8.5 STRL (GLOVE) ×4 IMPLANT
GLOVE EXAM NITRILE LRG STRL (GLOVE) IMPLANT
GLOVE EXAM NITRILE XL STR (GLOVE) IMPLANT
GLOVE EXAM NITRILE XS STR PU (GLOVE) IMPLANT
GLOVE INDICATOR 8.5 STRL (GLOVE) ×2 IMPLANT
GOWN STRL REUS W/ TWL LRG LVL3 (GOWN DISPOSABLE) ×1 IMPLANT
GOWN STRL REUS W/ TWL XL LVL3 (GOWN DISPOSABLE) IMPLANT
GOWN STRL REUS W/TWL 2XL LVL3 (GOWN DISPOSABLE) ×2 IMPLANT
GOWN STRL REUS W/TWL LRG LVL3 (GOWN DISPOSABLE) ×1
GOWN STRL REUS W/TWL XL LVL3 (GOWN DISPOSABLE)
HALTER HD/CHIN CERV TRACTION D (MISCELLANEOUS) ×2 IMPLANT
HEMOSTAT POWDER KIT SURGIFOAM (HEMOSTASIS) ×2 IMPLANT
KIT BASIN OR (CUSTOM PROCEDURE TRAY) ×2 IMPLANT
KIT ROOM TURNOVER OR (KITS) ×2 IMPLANT
NEEDLE HYPO 22GX1.5 SAFETY (NEEDLE) ×2 IMPLANT
NEEDLE SPNL 22GX3.5 QUINCKE BK (NEEDLE) ×2 IMPLANT
NS IRRIG 1000ML POUR BTL (IV SOLUTION) ×2 IMPLANT
OIL CARTRIDGE MAESTRO DRILL (MISCELLANEOUS)
PACK LAMINECTOMY NEURO (CUSTOM PROCEDURE TRAY) ×2 IMPLANT
PAD ARMBOARD 7.5X6 YLW CONV (MISCELLANEOUS) ×6 IMPLANT
PLATE ARCHON 2-LEVEL 38MM (Plate) ×2 IMPLANT
RUBBERBAND STERILE (MISCELLANEOUS) IMPLANT
SCREW ARCHON SELFTAP 4.0X13 (Screw) ×12 IMPLANT
SPONGE INTESTINAL PEANUT (DISPOSABLE) ×2 IMPLANT
SUT VIC AB 3-0 SH 8-18 (SUTURE) ×4 IMPLANT
TOWEL OR 17X24 6PK STRL BLUE (TOWEL DISPOSABLE) ×2 IMPLANT
TOWEL OR 17X26 10 PK STRL BLUE (TOWEL DISPOSABLE) ×2 IMPLANT
WATER STERILE IRR 1000ML POUR (IV SOLUTION) ×2 IMPLANT

## 2016-08-03 NOTE — Anesthesia Postprocedure Evaluation (Signed)
Anesthesia Post Note  Patient: Briana Miller  Procedure(s) Performed: Procedure(s) (LRB): ANTERIOR CERVICAL DECOMPRESSION/DISCECTOMY FUSION C4-5, C5-6 (N/A)  Patient location during evaluation: PACU Anesthesia Type: General Level of consciousness: awake Pain management: pain level controlled Vital Signs Assessment: post-procedure vital signs reviewed and stable Respiratory status: spontaneous breathing Cardiovascular status: stable Anesthetic complications: no       Last Vitals:  Vitals:   08/03/16 1025 08/03/16 1054  BP: 134/79 (!) 157/71  Pulse: 86 88  Resp: 13   Temp: 36.6 C 36.7 C    Last Pain:  Vitals:   08/03/16 1054  TempSrc: Oral  PainSc:                  Geovanie Winnett

## 2016-08-03 NOTE — Anesthesia Preprocedure Evaluation (Signed)
Anesthesia Evaluation  Patient identified by MRN, date of birth, ID band  Reviewed: Allergy & Precautions, NPO status , Patient's Chart, lab work & pertinent test results  Airway Mallampati: II  TM Distance: >3 FB     Dental   Pulmonary sleep apnea ,    breath sounds clear to auscultation       Cardiovascular hypertension, +CHF   Rhythm:Regular Rate:Normal     Neuro/Psych  Headaches,  Neuromuscular disease    GI/Hepatic Neg liver ROS, GERD  ,  Endo/Other    Renal/GU negative Renal ROS     Musculoskeletal  (+) Arthritis ,   Abdominal   Peds  Hematology  (+) anemia ,   Anesthesia Other Findings   Reproductive/Obstetrics                             Anesthesia Physical Anesthesia Plan  ASA: III and emergent  Anesthesia Plan: General   Post-op Pain Management:    Induction: Intravenous  Airway Management Planned: Oral ETT  Additional Equipment:   Intra-op Plan:   Post-operative Plan: Possible Post-op intubation/ventilation  Informed Consent: I have reviewed the patients History and Physical, chart, labs and discussed the procedure including the risks, benefits and alternatives for the proposed anesthesia with the patient or authorized representative who has indicated his/her understanding and acceptance.   Dental advisory given  Plan Discussed with: CRNA and Anesthesiologist  Anesthesia Plan Comments:         Anesthesia Quick Evaluation

## 2016-08-03 NOTE — Op Note (Signed)
Date of surgery: 08/03/2016 Preoperative diagnosis: Cervical spondylosis with myelopathy C4-5 C5-6. Paraparesis. Postoperative diagnosis: Same Procedure: Anterior cervical decompression C4-5 C5-6 arthrodesis with structural allograft, anterior plate fixation with nuvasive 38 mm plate  Surgeon: Barnett Abu M.D. Anesthesia: Gen. endotracheal Indications: Patient is a 61 year old individual who about 4 months ago noted weakness in her legs and lost the ability to walk independently. She's been doing transfers at home but has noted that she's been progressively weaker in her arms now. She was brought to the emergency department and ultimately admitted and MRIs of the cervical spine demonstrates that the patient has severe spondylosis with cord compression at C4-5 and C5-6. His been advised regarding the need for surgery. Procedure: The patient was brought to the operating room placed on the table in supine position. After the smooth induction of general endotracheal anesthesia neck was placed in 5 pounds of halter traction and prepped with alcohol and DuraPrep. After sterile draping and appropriate timeout procedure a transverse incision was created in the left side of the neck and carried down to the platysma. The plane between the sternocleidomastoid and strap muscles dissected bluntly until the prevertebral space was reached. The first identifiable disc space was noted to be C4-5 on a localizing radiograph. The dissection was then undertaken in the longus coli muscle to allow placement of a self-retaining Caspar type retractor.  The anterior longitudinal ligament was opened at E45 and ventral osteophytes were removed with a Leksell rongeur and Kerrison punch. Interspace was cleared of significant quantity of the degenerated disc material in the region of the posterior longitudinal ligament was removed. Dissection was carried out using a high-speed drill and 3-0 Karlin curettes. Uncinate processes were  drilled down and removed and osteophytes from the inferior margin of the body of C4 were removed with a Kerrison 2 mm gold punch. After the central canal and lateral recesses were well decompressed hemostasis was achieved with the bipolar cautery and some small pledgets of Gelfoam soaked in thrombin that were later irrigated away.  A 6 mm lordotic machined allograft was then fitted into the intervertebral space and tamped to the appropriate amount of recess.Marland Kitchen Next C5-6 was decompressed and fused in a similar fashion using again a 6 mm lordotic bone graft.  Next the retractor was removed and a 38 mm nuvasive plate was placed over the vertebral bodies and secured with 13 mm variable angle screws. A final localizing radiograph identified the position of the surgical construct. The stasis was achieved in the soft tissues and then the platysma was closed with 3-0 Vicryl in an interrupted fashion and 3-0 Vicryl was used in the subcuticular tissue. Blood loss was estimated at 100 mL

## 2016-08-03 NOTE — Progress Notes (Signed)
Patient arrived back to unit postop, alert and oriented, VSS.  No complaints of pain or nausea at this time.  Family at bedside.

## 2016-08-03 NOTE — Progress Notes (Signed)
Patient ID: Briana Miller, female   DOB: 1956-01-26, 61 y.o.   MRN: [id number]    PROGRESS NOTE  Briana Miller  WGN:[id number] DOB: [birth year] DOA: 08/01/2016  PCP: Lemont Fillers., NP   Brief Narrative:   Pt is 61 yo female with known HTN, combined systolic and diastolic CHF (last ECHO in 2017 with EF 45% and grade II diastolic CHF), morbid obesity, rather chronic lower back and lower extremity pain that initially started back in July 2017 after an episode of fall, several visits to ED for concern of ongoing pain that interferes with her ability to ambulate. Her knees are contracted and she says that has been there for a while as well. Pt was discharged from SNF in October 2017 after being there for 2 months. Per pt, pain in the LE's constant and 10/10 in severity, no specific alleviating factors, associated with weakness and inability to ambulate. In addition, she reports beds sores and incontinence. Pt also reports bilaterally upper extremity pain and weakness, has difficulty controlling her movements. Pt denies chest pain, has occasional exertional dyspnea, no fevers, chills, no specific abd concerns.   Assessment & Plan:   Active Problems: Lower and upper extremity pain and weakness - with confirmed cord compression on MRI study - taken to OR today  - continue analgesia as needed  - PT eval to be done eventually after surgery   Combined CHF - with LE edema and rather diminished breath sounds at bases - monitor weights, strict I/O - repeat ECHO with stable EF 45% - continue Torsemide   HTN - continue Losartan and Metoprolol   Morbid obesity - Body mass index is 42.2 kg/m.  DVT prophylaxis: Lovenox SQ Code Status: Full  Family Communication: Patient at bedside, family at bedside  Disposition Plan: SNF in few days, depending on how does post surgery   Consultants:   Neurosurgery   Procedures:   None  Antimicrobials:   None  Subjective: Still with LE and UP  weakness and pain.   Objective: Vitals:   08/02/16 0446 08/02/16 0925 08/02/16 2114 08/03/16 0508  BP: 140/65 126/68 129/70 (!) 151/69  Pulse: 87 92 83 87  Resp: 18 18 18 17   Temp: 98.2 F (36.8 C) 98.5 F (36.9 C) 98.7 F (37.1 C) 98.2 F (36.8 C)  TempSrc: Oral Oral Oral Oral  SpO2: 99% 97% 95% 98%  Weight: 115.5 kg (254 lb 11.2 oz)   115 kg (253 lb 9.6 oz)  Height:        Intake/Output Summary (Last 24 hours) at 08/03/16 0940 Last data filed at 08/03/16 7846  Gross per 24 hour  Intake              444 ml  Output             2400 ml  Net            -1956 ml   Filed Weights   08/01/16 1915 08/02/16 0446 08/03/16 0508  Weight: 117.4 kg (258 lb 14.4 oz) 115.5 kg (254 lb 11.2 oz) 115 kg (253 lb 9.6 oz)    Examination:  General exam: Appears in mild distress this AM due to pain.  Respiratory system: Respiratory effort normal. Diminished breath sounds at bases  Cardiovascular system: S1 & S2 heard, RRR. No JVD, murmurs, rubs, gallops or clicks. No pedal edema. Gastrointestinal system: Abdomen is nondistended, soft and nontender. No organomegaly or masses felt.  Central nervous system: Alert and oriented.  Data Reviewed: I  have personally reviewed following labs and imaging studies  CBC:  Recent Labs Lab 08/01/16 1546 08/02/16 0215 08/03/16 0326  WBC 6.5 6.7 5.2  NEUTROABS 3.3  --   --   HGB 11.3* 10.3* 11.1*  HCT 32.6* 30.2* 32.2*  MCV 84.2 84.6 84.7  PLT 314 267 281   Basic Metabolic Panel:  Recent Labs Lab 08/01/16 1546 08/02/16 0215 08/03/16 0326  NA 141 142 136  K 3.6 3.5 3.9  CL 107 107 103  CO2 26 26 23   GLUCOSE 112* 95 208*  BUN 15 14 16   CREATININE 0.87 0.86 0.82  CALCIUM 9.1 9.1 9.2  MG  --  1.9  --   PHOS  --  4.2  --    Liver Function Tests:  Recent Labs Lab 08/01/16 1546 08/02/16 0215  AST 29 26  ALT 27 24  ALKPHOS 63 56  BILITOT 0.6 0.7  PROT 7.1 6.1*  ALBUMIN 3.6 3.3*   Cardiac Enzymes:  Recent Labs Lab 08/01/16 2056  08/02/16 0215 08/02/16 1417  TROPONINI <0.03 <0.03 <0.03   Thyroid Function Tests:  Recent Labs  08/02/16 0215  TSH 2.954   Urine analysis:    Component Value Date/Time   COLORURINE YELLOW 08/01/2016 1604   APPEARANCEUR CLEAR 08/01/2016 1604   LABSPEC 1.013 08/01/2016 1604   PHURINE 6.0 08/01/2016 1604   GLUCOSEU NEGATIVE 08/01/2016 1604   GLUCOSEU NEGATIVE 09/21/2015 0957   HGBUR NEGATIVE 08/01/2016 1604   BILIRUBINUR NEGATIVE 08/01/2016 1604   BILIRUBINUR negative 06/22/2015 1046   KETONESUR NEGATIVE 08/01/2016 1604   PROTEINUR NEGATIVE 08/01/2016 1604   UROBILINOGEN 0.2 09/21/2015 0957   NITRITE NEGATIVE 08/01/2016 1604   LEUKOCYTESUR NEGATIVE 08/01/2016 1604   Radiology Studies: Dg Chest 2 View  Result Date: 08/01/2016 CLINICAL DATA:  Left groin pain, left leg swelling, weakness EXAM: CHEST  2 VIEW COMPARISON:  04/06/2014 FINDINGS: Cardiomediastinal silhouette is stable. No infiltrate or pleural effusion. No pulmonary edema. Extensive degenerative changes right shoulder. Moderate degenerative changes left shoulder. Osteopenia and mild degenerative changes mid thoracic spine. IMPRESSION: No active cardiopulmonary disease. Degenerative changes bilateral shoulders right greater than left. Electronically Signed   By: Natasha Mead M.D.   On: 08/01/2016 15:58   Dg Cervical Spine 2 Or 3 Views  Result Date: 08/02/2016 CLINICAL DATA:  Subacute onset of posterior neck pain. Initial encounter. EXAM: CERVICAL SPINE - 2-3 VIEW COMPARISON:  MRI of the cervical spine performed earlier today at 10:29 a.m. FINDINGS: There is no evidence of fracture or subluxation. Vertebral bodies demonstrate normal height and alignment. Multilevel disc space narrowing is noted along the lower cervical spine, with scattered anterior and posterior disc osteophyte complexes. Prevertebral soft tissues are within normal limits. The provided odontoid view demonstrates no significant abnormality. The visualized lung  apices are clear. IMPRESSION: 1. No evidence of fracture or subluxation along the cervical spine. 2. Mild degenerative change along the lower cervical spine. Electronically Signed   By: Roanna Raider M.D.   On: 08/02/2016 18:32   Mr Cervical Spine W Wo Contrast  Result Date: 08/02/2016 CLINICAL DATA:  Profound lower extremity weakness. EXAM: MRI TOTAL SPINE WITHOUT AND WITH CONTRAST TECHNIQUE: Multisequence multi planar MR imaging of the spine from the cervical spine to the sacrum was performed prior to and following IV contrast administration. CONTRAST:  20mL MULTIHANCE GADOBENATE DIMEGLUMINE 529 MG/ML IV SOLN COMPARISON:  None. FINDINGS: MRI CERVICAL SPINE FINDINGS Alignment: Straightening without subluxation. Vertebrae: Altered marrow signal around the C4-5 and C5-6 degenerated endplates.  Diffusely hypointense marrow without focal lesion. Cord: Degenerative compression at C4-5 with ill-defined T2 hyperintensity -edematous appearance. Posterior Fossa, vertebral arteries, paraspinal tissues: No acute finding. Disc levels: C2-3: Facet spurring on the right, mild.  No impingement C3-4: Asymmetric rightward endplate and uncovertebral ridging. Mild right facet spurring. No impingement C4-5: Disc degeneration with posterior disc osteophyte complex, predominately disc. There may be a superimposed broad disc protrusion. Right more than left uncovertebral spurring. Spinal stenosis with cord deformity and T2 hyperintensity. Biforaminal impingement, greater on the right. C5-6: Degenerative disc disease with disc osteophyte complex and uncovertebral ridging. Spinal stenosis is moderate. Small dorsal epidural T2 hyperintensity on sagittal acquisition of the level of C5 has no associated inflammatory changes or rim enhancement on postcontrast imaging. On the midline slice with the ligamentum flavum has a deficient appearance, but para median ligament is intact and unremarkable. There is no historical indication of trauma.  C6-7: No impingement C7-T1:Unremarkable. MRI THORACIC SPINE FINDINGS Alignment:  Unremarkable Vertebrae: Diffusely hypointense marrow. Negative for acute fracture, discitis, or aggressive bone lesion. Cord:  Normal signal and morphology. Paraspinal and other soft tissues: Small area of signal abnormality in the deep dependent right lung is presumably atelectasis in this setting. Disc levels: Diffuse mild disc bulging.  No impingement or notable herniation. MRI LUMBAR SPINE FINDINGS Segmentation:  Standard. Alignment:  Facet mediated mild grade 1 anterolisthesis at L4-5. Vertebrae: Diffusely hypointense marrow. Negative for acute fracture, discitis, or aggressive bone lesion. Conus medullaris: Extends to the L1 level and appears normal. Paraspinal and other soft tissues: Diffuse subcutaneous edema. Disc levels: T12- L1: Unremarkable. L1-L2: Mild facet spurring.  No herniation or impingement L2-L3: Facet arthropathy with moderate posterior element overgrowth. Mild disc bulging. No impingement L3-L4: Prominent facet arthropathy with spurring and left-sided joint effusion. Mild disc bulging. No compressive stenosis. L4-L5: Advanced facet arthropathy with slight anterolisthesis. Bilateral joint effusion. Disc bulging. Inferior foraminal narrowing greater on the right without L4 compression. Moderate spinal stenosis. L5-S1:Facet arthropathy with mild to moderate spurring. No herniation or impingement. These results were called by telephone at the time of interpretation on 08/02/2016 at 1:23 pm to Dr. Izola Price who verbally acknowledged these results. Diffusely motion degraded MRI which could affect sensitivity of small or subtle findings. IMPRESSION: 1. C4-5 and C5-6 spinal stenosis from disc degeneration. At C4-5 there is cord flattening and cord edema. 2. C4-5 advanced right foraminal stenosis. 3. No impingement or signs of myelopathy in the thoracic spine. 4. Lumbar spine shows prominent facet arthropathy, especially at  L3-4 and L4-5. Moderate spinal stenosis at L4-5. 5. Diffusely hypointense marrow, often from anemia or chronic hypoxemia. Correlate with CBC. 6. Motion degraded exam. Electronically Signed   By: Marnee Spring M.D.   On: 08/02/2016 13:30   Mr Thoracic Spine W Wo Contrast  Result Date: 08/02/2016 CLINICAL DATA:  Profound lower extremity weakness. EXAM: MRI TOTAL SPINE WITHOUT AND WITH CONTRAST TECHNIQUE: Multisequence multi planar MR imaging of the spine from the cervical spine to the sacrum was performed prior to and following IV contrast administration. CONTRAST:  20mL MULTIHANCE GADOBENATE DIMEGLUMINE 529 MG/ML IV SOLN COMPARISON:  None. FINDINGS: MRI CERVICAL SPINE FINDINGS Alignment: Straightening without subluxation. Vertebrae: Altered marrow signal around the C4-5 and C5-6 degenerated endplates. Diffusely hypointense marrow without focal lesion. Cord: Degenerative compression at C4-5 with ill-defined T2 hyperintensity -edematous appearance. Posterior Fossa, vertebral arteries, paraspinal tissues: No acute finding. Disc levels: C2-3: Facet spurring on the right, mild.  No impingement C3-4: Asymmetric rightward endplate and uncovertebral ridging. Mild  right facet spurring. No impingement C4-5: Disc degeneration with posterior disc osteophyte complex, predominately disc. There may be a superimposed broad disc protrusion. Right more than left uncovertebral spurring. Spinal stenosis with cord deformity and T2 hyperintensity. Biforaminal impingement, greater on the right. C5-6: Degenerative disc disease with disc osteophyte complex and uncovertebral ridging. Spinal stenosis is moderate. Small dorsal epidural T2 hyperintensity on sagittal acquisition of the level of C5 has no associated inflammatory changes or rim enhancement on postcontrast imaging. On the midline slice with the ligamentum flavum has a deficient appearance, but para median ligament is intact and unremarkable. There is no historical indication  of trauma. C6-7: No impingement C7-T1:Unremarkable. MRI THORACIC SPINE FINDINGS Alignment:  Unremarkable Vertebrae: Diffusely hypointense marrow. Negative for acute fracture, discitis, or aggressive bone lesion. Cord:  Normal signal and morphology. Paraspinal and other soft tissues: Small area of signal abnormality in the deep dependent right lung is presumably atelectasis in this setting. Disc levels: Diffuse mild disc bulging.  No impingement or notable herniation. MRI LUMBAR SPINE FINDINGS Segmentation:  Standard. Alignment:  Facet mediated mild grade 1 anterolisthesis at L4-5. Vertebrae: Diffusely hypointense marrow. Negative for acute fracture, discitis, or aggressive bone lesion. Conus medullaris: Extends to the L1 level and appears normal. Paraspinal and other soft tissues: Diffuse subcutaneous edema. Disc levels: T12- L1: Unremarkable. L1-L2: Mild facet spurring.  No herniation or impingement L2-L3: Facet arthropathy with moderate posterior element overgrowth. Mild disc bulging. No impingement L3-L4: Prominent facet arthropathy with spurring and left-sided joint effusion. Mild disc bulging. No compressive stenosis. L4-L5: Advanced facet arthropathy with slight anterolisthesis. Bilateral joint effusion. Disc bulging. Inferior foraminal narrowing greater on the right without L4 compression. Moderate spinal stenosis. L5-S1:Facet arthropathy with mild to moderate spurring. No herniation or impingement. These results were called by telephone at the time of interpretation on 08/02/2016 at 1:23 pm to Dr. Izola Price who verbally acknowledged these results. Diffusely motion degraded MRI which could affect sensitivity of small or subtle findings. IMPRESSION: 1. C4-5 and C5-6 spinal stenosis from disc degeneration. At C4-5 there is cord flattening and cord edema. 2. C4-5 advanced right foraminal stenosis. 3. No impingement or signs of myelopathy in the thoracic spine. 4. Lumbar spine shows prominent facet arthropathy,  especially at L3-4 and L4-5. Moderate spinal stenosis at L4-5. 5. Diffusely hypointense marrow, often from anemia or chronic hypoxemia. Correlate with CBC. 6. Motion degraded exam. Electronically Signed   By: Marnee Spring M.D.   On: 08/02/2016 13:30   Mr Lumbar Spine W Wo Contrast  Result Date: 08/02/2016 CLINICAL DATA:  Profound lower extremity weakness. EXAM: MRI TOTAL SPINE WITHOUT AND WITH CONTRAST TECHNIQUE: Multisequence multi planar MR imaging of the spine from the cervical spine to the sacrum was performed prior to and following IV contrast administration. CONTRAST:  48mL MULTIHANCE GADOBENATE DIMEGLUMINE 529 MG/ML IV SOLN COMPARISON:  None. FINDINGS: MRI CERVICAL SPINE FINDINGS Alignment: Straightening without subluxation. Vertebrae: Altered marrow signal around the C4-5 and C5-6 degenerated endplates. Diffusely hypointense marrow without focal lesion. Cord: Degenerative compression at C4-5 with ill-defined T2 hyperintensity -edematous appearance. Posterior Fossa, vertebral arteries, paraspinal tissues: No acute finding. Disc levels: C2-3: Facet spurring on the right, mild.  No impingement C3-4: Asymmetric rightward endplate and uncovertebral ridging. Mild right facet spurring. No impingement C4-5: Disc degeneration with posterior disc osteophyte complex, predominately disc. There may be a superimposed broad disc protrusion. Right more than left uncovertebral spurring. Spinal stenosis with cord deformity and T2 hyperintensity. Biforaminal impingement, greater on the right. C5-6: Degenerative disc  disease with disc osteophyte complex and uncovertebral ridging. Spinal stenosis is moderate. Small dorsal epidural T2 hyperintensity on sagittal acquisition of the level of C5 has no associated inflammatory changes or rim enhancement on postcontrast imaging. On the midline slice with the ligamentum flavum has a deficient appearance, but para median ligament is intact and unremarkable. There is no historical  indication of trauma. C6-7: No impingement C7-T1:Unremarkable. MRI THORACIC SPINE FINDINGS Alignment:  Unremarkable Vertebrae: Diffusely hypointense marrow. Negative for acute fracture, discitis, or aggressive bone lesion. Cord:  Normal signal and morphology. Paraspinal and other soft tissues: Small area of signal abnormality in the deep dependent right lung is presumably atelectasis in this setting. Disc levels: Diffuse mild disc bulging.  No impingement or notable herniation. MRI LUMBAR SPINE FINDINGS Segmentation:  Standard. Alignment:  Facet mediated mild grade 1 anterolisthesis at L4-5. Vertebrae: Diffusely hypointense marrow. Negative for acute fracture, discitis, or aggressive bone lesion. Conus medullaris: Extends to the L1 level and appears normal. Paraspinal and other soft tissues: Diffuse subcutaneous edema. Disc levels: T12- L1: Unremarkable. L1-L2: Mild facet spurring.  No herniation or impingement L2-L3: Facet arthropathy with moderate posterior element overgrowth. Mild disc bulging. No impingement L3-L4: Prominent facet arthropathy with spurring and left-sided joint effusion. Mild disc bulging. No compressive stenosis. L4-L5: Advanced facet arthropathy with slight anterolisthesis. Bilateral joint effusion. Disc bulging. Inferior foraminal narrowing greater on the right without L4 compression. Moderate spinal stenosis. L5-S1:Facet arthropathy with mild to moderate spurring. No herniation or impingement. These results were called by telephone at the time of interpretation on 08/02/2016 at 1:23 pm to Dr. Izola Price who verbally acknowledged these results. Diffusely motion degraded MRI which could affect sensitivity of small or subtle findings. IMPRESSION: 1. C4-5 and C5-6 spinal stenosis from disc degeneration. At C4-5 there is cord flattening and cord edema. 2. C4-5 advanced right foraminal stenosis. 3. No impingement or signs of myelopathy in the thoracic spine. 4. Lumbar spine shows prominent facet  arthropathy, especially at L3-4 and L4-5. Moderate spinal stenosis at L4-5. 5. Diffusely hypointense marrow, often from anemia or chronic hypoxemia. Correlate with CBC. 6. Motion degraded exam. Electronically Signed   By: Marnee Spring M.D.   On: 08/02/2016 13:30   Dg Hips Bilat With Pelvis 2v  Result Date: 08/02/2016 CLINICAL DATA:  Weakness and BILATERAL lower extremity edema since July 2017, LEFT groin pain and swelling, BILATERAL knee contractures, history CHF, cardiomyopathy, overactive bladder, heart failure EXAM: DG HIP (WITH OR WITHOUT PELVIS) 2V BILAT COMPARISON:  None FINDINGS: Mild osseous demineralization. Hip and SI joint spaces preserved. No acute fracture, dislocation, or bone destruction. IMPRESSION: No acute osseous abnormalities. Electronically Signed   By: Ulyses Southward M.D.   On: 08/02/2016 13:43      Scheduled Meds: . ceFAZolin      . [MAR Hold] Chlorhexidine Gluconate Cloth  6 each Topical Q0600  . [MAR Hold] dexamethasone  4 mg Intravenous Q6H  . [MAR Hold] enoxaparin (LOVENOX) injection  40 mg Subcutaneous Q24H  . [MAR Hold] gabapentin  600 mg Oral TID  . [MAR Hold] losartan  50 mg Oral Daily  . [MAR Hold] metoprolol succinate  50 mg Oral Daily  . [MAR Hold] mupirocin ointment  1 application Nasal BID  . [MAR Hold] senna  1 tablet Oral BID  . [MAR Hold] sodium chloride flush  3 mL Intravenous Q12H  . [MAR Hold] torsemide  10 mg Oral Daily   Continuous Infusions:   LOS: 2 days   Time spent: 20 minutes  Debbora Presto, MD [facility] Pager [phone]  If 7PM-7AM, please contact night-coverage [email or web address] Password [facility] 08/03/2016, 9:40 AM

## 2016-08-03 NOTE — Anesthesia Procedure Notes (Signed)
Procedure Name: Intubation Date/Time: 08/03/2016 7:59 AM Performed by: Trixie Deis A Pre-anesthesia Checklist: Patient identified, Emergency Drugs available, Suction available and Patient being monitored Patient Re-evaluated:Patient Re-evaluated prior to inductionOxygen Delivery Method: Circle System Utilized Preoxygenation: Pre-oxygenation with 100% oxygen Intubation Type: IV induction Ventilation: Mask ventilation without difficulty Laryngoscope Size: Mac and 3 Grade View: Grade I Tube type: Oral Tube size: 7.0 mm Number of attempts: 1 Airway Equipment and Method: Stylet and Oral airway Placement Confirmation: ETT inserted through vocal cords under direct vision,  positive ETCO2 and breath sounds checked- equal and bilateral Secured at: 21 cm Tube secured with: Tape Dental Injury: Teeth and Oropharynx as per pre-operative assessment

## 2016-08-03 NOTE — Transfer of Care (Signed)
Immediate Anesthesia Transfer of Care Note  Patient: Briana Miller  Procedure(s) Performed: Procedure(s): ANTERIOR CERVICAL DECOMPRESSION/DISCECTOMY FUSION C4-5, C5-6 (N/A)  Patient Location: PACU  Anesthesia Type:General  Level of Consciousness: awake, alert  and oriented  Airway & Oxygen Therapy: Patient Spontanous Breathing and Patient connected to nasal cannula oxygen  Post-op Assessment: Report given to RN, Post -op Vital signs reviewed and stable and Patient moving all extremities  Post vital signs: Reviewed and stable  Last Vitals:  Vitals:   08/02/16 2114 08/03/16 0508  BP: 129/70 (!) 151/69  Pulse: 83 87  Resp: 18 17  Temp: 37.1 C 36.8 C    Last Pain:  Vitals:   08/03/16 0508  TempSrc: Oral  PainSc:       Patients Stated Pain Goal: 3 (08/02/16 3704)  Complications: No apparent anesthesia complications

## 2016-08-03 NOTE — Progress Notes (Signed)
Patient underwent anterior cervical decompression surgery this morning.  Patient came back to the unit around 1040.  Patient was alert and awake when she came back from PACU.  Neuro checks remain unremarkable.  Surgical site open to air.  No drainage or swelling noted.  Patient voiding and passing gas.  Bowel sounds positive x4.  Blood pressure readings high.  Medicated per orders.  Elnita Maxwell, RN

## 2016-08-03 NOTE — Progress Notes (Signed)
No further deviation noted during the night. Remained NPO after having snack @ 2300. CHG baths done @ hs & AM respectively. Voided 250 cc of clear yellow urine @ 0645. Taken down for sx @ 0650.

## 2016-08-04 ENCOUNTER — Other Ambulatory Visit: Payer: Self-pay | Admitting: Cardiology

## 2016-08-04 ENCOUNTER — Encounter (HOSPITAL_COMMUNITY): Payer: Self-pay | Admitting: Neurological Surgery

## 2016-08-04 LAB — CBC
HCT: 30.1 % — ABNORMAL LOW (ref 36.0–46.0)
HEMOGLOBIN: 10.3 g/dL — AB (ref 12.0–15.0)
MCH: 29 pg (ref 26.0–34.0)
MCHC: 34.2 g/dL (ref 30.0–36.0)
MCV: 84.8 fL (ref 78.0–100.0)
Platelets: 289 10*3/uL (ref 150–400)
RBC: 3.55 MIL/uL — AB (ref 3.87–5.11)
RDW: 14 % (ref 11.5–15.5)
WBC: 11.9 10*3/uL — AB (ref 4.0–10.5)

## 2016-08-04 LAB — BASIC METABOLIC PANEL
ANION GAP: 10 (ref 5–15)
BUN: 15 mg/dL (ref 6–20)
CHLORIDE: 106 mmol/L (ref 101–111)
CO2: 24 mmol/L (ref 22–32)
Calcium: 8.9 mg/dL (ref 8.9–10.3)
Creatinine, Ser: 0.75 mg/dL (ref 0.44–1.00)
GFR calc Af Amer: >60 mL/min (ref >60)
GFR calc non Af Amer: >60 mL/min (ref >60)
GLUCOSE: 151 mg/dL — AB (ref 65–99)
POTASSIUM: 3.6 mmol/L (ref 3.5–5.1)
Sodium: 140 mmol/L (ref 135–145)

## 2016-08-04 MED ORDER — TRAMADOL HCL 50 MG PO TABS: 50.0000 mg | ORAL_TABLET | Freq: Four times a day (QID) | ORAL | Status: DC | PRN

## 2016-08-04 NOTE — Progress Notes (Signed)
Physical Therapy Treatment Patient Details Name: Briana Miller MRN: [id number] DOB: 03/20/1956 Today's Date: 08/04/2016    History of Present Illness Pt is a 61 y/o female admitted secondary to bilateral LE weakness and edema, originally starting in July of 2017 per pt's report. Underwent ACDF due to cord compression 08/03/16. PMH including but not limited to CHF, HTN and R TKA in 2013.    PT Comments    Patient tolerated EOB and OOB to chair activity today. Educated patient of LE movement in chair and ROM as tolerated. Current POC remains appropriate.   Follow Up Recommendations  SNF     Equipment Recommendations  None recommended by PT    Recommendations for Other Services       Precautions / Restrictions Precautions Precautions: Fall;Cervical Precaution Comments: instructed in cervical precautions Restrictions Weight Bearing Restrictions: No    Mobility  Bed Mobility Overal bed mobility: Needs Assistance Bed Mobility: Supine to Sit     Supine to sit: +2 for physical assistance;Mod assist     General bed mobility comments: pivoted to EOB with bed sheet and pad in preparation for AP transfer  Transfers Overall transfer level: Needs assistance   Transfers: Anterior-Posterior Transfer       Anterior-Posterior transfers: +2 physical assistance;Total assist      Ambulation/Gait                 Stairs            Wheelchair Mobility    Modified Rankin (Stroke Patients Only)       Balance Overall balance assessment: Needs assistance;History of Falls Sitting-balance support: Feet supported;No upper extremity supported Sitting balance-Leahy Scale: Poor Sitting balance - Comments: requires one hand on rail for static sitting                            Cognition Arousal/Alertness: Awake/alert Behavior During Therapy: WFL for tasks assessed/performed Overall Cognitive Status: Within Functional Limits for tasks assessed                 General Comments: anxious about falling, has had numerous falls at home    Exercises      General Comments        Pertinent Vitals/Pain Pain Assessment: Faces Faces Pain Scale: Hurts little more Pain Location: throat Pain Descriptors / Indicators: Sore Pain Intervention(s): Monitored during session    Home Living Family/patient expects to be discharged to:: Private residence Living Arrangements: Spouse/significant other;Children Available Help at Discharge: Family;Available PRN/intermittently Type of Home: House Home Access: Ramped entrance Entrance Stairs-Rails: None Home Layout: One level Home Equipment: Walker - 2 wheels;Wheelchair - manual;Toilet riser      Prior Function Level of Independence: Needs assistance  Gait / Transfers Assistance Needed: pt reported using a w/c or staying in bed all day, states her husband lifts her to her chair ADL's / Homemaking Assistance Needed: pt is dependent for bathing and dressing by daughter at bed level      PT Goals (current goals can now be found in the care plan section) Acute Rehab PT Goals Patient Stated Goal: go to rehab and to walk again PT Goal Formulation: With patient Time For Goal Achievement: 08/16/16 Potential to Achieve Goals: Fair Progress towards PT goals: Progressing toward goals    Frequency    Min 2X/week      PT Plan Current plan remains appropriate    Co-evaluation PT/OT/SLP Co-Evaluation/Treatment: Yes Reason for Co-Treatment:  For patient/therapist safety PT goals addressed during session: Mobility/safety with mobility       End of Session   Activity Tolerance: Patient limited by pain Patient left: in chair;with call bell/phone within reach;with chair alarm set;Other (comment) Nurse Communication: Mobility status (lift pad provided)       Time: 1245-8099 PT Time Calculation (min) (ACUTE ONLY): 26 min  Charges:  $Therapeutic Activity: 8-22 mins                    G Codes:        Fabio Asa 08/08/2016, 1:08 PM Charlotte Crumb, PT DPT  [phone]

## 2016-08-04 NOTE — Clinical Social Work Placement (Signed)
   CLINICAL SOCIAL WORK PLACEMENT  NOTE  Date:  08/04/2016  Patient Details  Name: Briana Miller MRN: [id number] Date of Birth: [birth year]  Clinical Social Work is seeking post-discharge placement for this patient at the Skilled  Nursing Facility level of care (*CSW will initial, date and re-position this form in  chart as items are completed):  Yes   Patient/family provided with [facility] Clinical Social Work Department's list of facilities offering this level of care within the geographic area requested by the patient (or if unable, by the patient's family).  Yes   Patient/family informed of their freedom to choose among providers that offer the needed level of care, that participate in Medicare, Medicaid or managed care program needed by the patient, have an available bed and are willing to accept the patient.  Yes   Patient/family informed of [facility]'s ownership interest in [facility] and [facility], as well as of the fact that they are under no obligation to receive care at these facilities.  PASRR submitted to EDS on 08/04/16     PASRR number received on       Existing PASRR number confirmed on 08/04/16     FL2 transmitted to all facilities in geographic area requested by pt/family on 08/04/16     FL2 transmitted to all facilities within larger geographic area on       Patient informed that his/her managed care company has contracts with or will negotiate with certain facilities, including the following:            Patient/family informed of bed offers received.  Patient chooses bed at       Physician recommends and patient chooses bed at      Patient to be transferred to   on  .  Patient to be transferred to facility by       Patient family notified on   of transfer.  Name of family member notified:        PHYSICIAN Please sign FL2     Additional Comment:    _______________________________________________ Margarito Liner, LCSW 08/04/2016,  11:17 AM

## 2016-08-04 NOTE — Telephone Encounter (Signed)
I have placed this in my completed paperwork folder.

## 2016-08-04 NOTE — Evaluation (Signed)
Occupational Therapy Evaluation Patient Details Name: Briana Miller MRN: [id number] DOB: 1955/12/27 Today's Date: 08/04/2016    History of Present Illness Pt is a 61 y/o female admitted secondary to bilateral LE weakness and edema, originally starting in July of 2017 per pt's report. Underwent ACDF due to cord compression 08/03/16. PMH including but not limited to CHF, HTN and R TKA in 2013.   Clinical Impression   Pt with baseline dependence in bathing, dressing, toileting and transfers. She has limited shoulder movement and demonstrates impaired sitting balance. She is not able to stand with L knee contracture interfering. Pt wants to be able to transfer herself. Recommending further therapy at SNF prior to return home.    Follow Up Recommendations  SNF;Supervision/Assistance - 24 hour    Equipment Recommendations   (hoyer lift, hospital bed)    Recommendations for Other Services       Precautions / Restrictions Precautions Precautions: Fall;Cervical Precaution Comments: instructed in cervical precautions Restrictions Weight Bearing Restrictions: No      Mobility Bed Mobility Overal bed mobility: Needs Assistance Bed Mobility: Supine to Sit     Supine to sit: +2 for physical assistance;Mod assist     General bed mobility comments: pivoted to EOB with bed sheet and pad in preparation for AP transfer  Transfers Overall transfer level: Needs assistance   Transfers: Anterior-Posterior Transfer       Anterior-Posterior transfers: +2 physical assistance;Total assist        Balance Overall balance assessment: Needs assistance;History of Falls Sitting-balance support: Feet supported;No upper extremity supported Sitting balance-Leahy Scale: Poor Sitting balance - Comments: requires one hand on rail for static sitting                                    ADL Overall ADL's : Needs assistance/impaired Eating/Feeding: Independent;Bed level   Grooming:  Wash/dry hands;Wash/dry face;Sitting;Set up                                 General ADL Comments: Pt is dependent in bathing and dressing at baseline. Pt with L knee contracture, unable to stand.     Vision Patient Visual Report: No change from baseline       Perception     Praxis      Pertinent Vitals/Pain Pain Assessment: Faces Faces Pain Scale: Hurts little more Pain Location: throat Pain Descriptors / Indicators: Sore Pain Intervention(s): Monitored during session     Hand Dominance Left   Extremity/Trunk Assessment Upper Extremity Assessment Upper Extremity Assessment: RUE deficits/detail;LUE deficits/detail RUE Deficits / Details: hx of RCR, limited shoulder at baseline RUE Coordination: decreased gross motor LUE Deficits / Details: crepitus in shoulder, reports need for shoulder surgery, longstanding limitations LUE Coordination: decreased gross motor   Lower Extremity Assessment Lower Extremity Assessment: Defer to PT evaluation   Cervical / Trunk Assessment Cervical / Trunk Assessment: Normal   Communication Communication Communication: No difficulties   Cognition Arousal/Alertness: Awake/alert Behavior During Therapy: WFL for tasks assessed/performed Overall Cognitive Status: Within Functional Limits for tasks assessed                 General Comments: anxious about falling, has had numerous falls at home   General Comments       Exercises       Shoulder Instructions      Home Living  Family/patient expects to be discharged to:: Private residence Living Arrangements: Spouse/significant other;Children Available Help at Discharge: Family;Available PRN/intermittently Type of Home: House Home Access: Ramped entrance Entrance Stairs-Number of Steps: 3 Entrance Stairs-Rails: None Home Layout: One level     Bathroom Shower/Tub: Walk-in shower;Tub/shower unit   Bathroom Toilet: Standard Bathroom Accessibility: Yes   Home  Equipment: Walker - 2 wheels;Wheelchair - manual;Toilet riser          Prior Functioning/Environment Level of Independence: Needs assistance  Gait / Transfers Assistance Needed: pt reported using a w/c or staying in bed all day, states her husband lifts her to her chair ADL's / Homemaking Assistance Needed: pt is dependent for bathing and dressing by daughter at bed level             OT Problem List: Decreased strength;Decreased range of motion;Decreased activity tolerance;Impaired balance (sitting and/or standing);Decreased knowledge of use of DME or AE;Impaired UE functional use;Decreased coordination      OT Treatment/Interventions:      OT Goals(Current goals can be found in the care plan section) Acute Rehab OT Goals Patient Stated Goal: go to rehab and to walk again  OT Frequency:     Barriers to D/C:            Co-evaluation PT/OT/SLP Co-Evaluation/Treatment: Yes Reason for Co-Treatment: For patient/therapist safety          End of Session Nurse Communication: Need for lift equipment  Activity Tolerance: Patient tolerated treatment well Patient left: in chair;with call bell/phone within reach;with chair alarm set  OT Visit Diagnosis: Muscle weakness (generalized) (M62.81);History of falling (Z91.81)                ADL either performed or assessed with clinical judgement  Time: 0927-0953 OT Time Calculation (min): 26 min Charges:  OT General Charges $OT Visit: 1 Procedure OT Evaluation $OT Eval Moderate Complexity: 1 Procedure G-Codes:       Evern Bio 08/04/2016, 12:52 PM  [phone]

## 2016-08-04 NOTE — Progress Notes (Addendum)
Patient ID: Briana Miller, female   DOB: April 02, 1956, 61 y.o.   MRN: [id number]    PROGRESS NOTE  Briana Miller  WUJ:[id number] DOB: [birth year] DOA: 08/01/2016  PCP: Lemont Fillers., NP   Brief Narrative:   Pt is 61 yo female with known HTN, combined systolic and diastolic CHF (last ECHO in 2017 with EF 45% and grade II diastolic CHF), morbid obesity, rather chronic lower back and lower extremity pain that initially started back in July 2017 after an episode of fall, several visits to ED for concern of ongoing pain that interferes with her ability to ambulate. Her knees are contracted and she says that has been there for a while as well. Pt was discharged from SNF in October 2017 after being there for 2 months. Per pt, pain in the LE's constant and 10/10 in severity, no specific alleviating factors, associated with weakness and inability to ambulate. In addition, she reports beds sores and incontinence. Pt also reports bilaterally upper extremity pain and weakness, has difficulty controlling her movements. Pt denies chest pain, has occasional exertional dyspnea, no fevers, chills, no specific abd concerns.   Assessment & Plan:   Active Problems: Lower and upper extremity pain and weakness, Cervical spondylosis with myelopathy C4-5 C5-6. Paraparesis. - with confirmed cord compression on MRI study - s/p Anterior cervical decompression C4-5 C5-6 arthrodesis with structural allograft, anterior plate fixation with nuvasive 38 mm plate, post op day #1 - pt reports feeling better, ready to start with PT/OT - SNF will be needed, work in progress  - continue Decadron and taper down per neurosurgery   Left LE wounds present on admission, not due to pressure but rather caused by previous trauma - two areas on her LLE, left lateral thigh; several areas of partial thickness skin loss, pink, dry; no drainage - left posterior achilles; 1.5cm x 1.0cm x 0.2cm; pink, moist, minimal serosanguinous  drainage - recommendations per [facility] team is to use silicone foam to each site, to protect and insulate. - appreciate assistance   Leukocytosis - reactive, post op - no fevers  - monitor   Chronic Combined CHF - with LE edema and rather diminished breath sounds at bases but no signs of overt volume overload  - monitor weights, strict I/O - repeat ECHO with stable EF 45% - continue Torsemide  - weight trend since admission  Filed Weights   08/02/16 0446 08/03/16 0508 08/04/16 0539  Weight: 115.5 kg (254 lb 11.2 oz) 115 kg (253 lb 9.6 oz) 113.8 kg (250 lb 14.4 oz)   HTN - continue Losartan and Metoprolol   Morbid obesity - Body mass index is 41.75 kg/m.  DVT prophylaxis: Lovenox SQ Code Status: Full  Family Communication: Patient at bedside, family at bedside  Disposition Plan: SNF in few days, depending on how does post surgery   Consultants:   Neurosurgery   Procedures:   None  Antimicrobials:   None  Subjective: Reports feeling better.   Objective: Vitals:   08/03/16 0508 08/04/16 0100 08/04/16 0539 08/04/16 1000  BP:  129/60 (!) 104/46 128/60  Pulse:  84 77 72  Resp:  16 16 18   Temp:  98.8 F (37.1 C) 98.6 F (37 C) 98.4 F (36.9 C)  TempSrc:  Oral Oral Oral  SpO2:  97% 97% 98%  Weight: 115 kg (253 lb 9.6 oz)  113.8 kg (250 lb 14.4 oz)   Height:        Intake/Output Summary (Last 24 hours) at 08/04/16 1342 Last  data filed at 08/04/16 1125  Gross per 24 hour  Intake              580 ml  Output             2350 ml  Net            -1770 ml   Filed Weights   08/02/16 0446 08/03/16 0508 08/04/16 0539  Weight: 115.5 kg (254 lb 11.2 oz) 115 kg (253 lb 9.6 oz) 113.8 kg (250 lb 14.4 oz)    Examination:  General exam: Appears comfortable, NAD Respiratory system: Respiratory effort normal. Diminished breath sounds at bases  Cardiovascular system: S1 & S2 heard, RRR. No JVD, murmurs, rubs, gallops or clicks. No pedal edema. Gastrointestinal system:  Abdomen is nondistended, soft and nontender. No organomegaly or masses felt.  Central nervous system: Alert and oriented.  Data Reviewed: I have personally reviewed following labs and imaging studies  CBC:  Recent Labs Lab 08/01/16 1546 08/02/16 0215 08/03/16 0326 08/04/16 0537  WBC 6.5 6.7 5.2 11.9*  NEUTROABS 3.3  --   --   --   HGB 11.3* 10.3* 11.1* 10.3*  HCT 32.6* 30.2* 32.2* 30.1*  MCV 84.2 84.6 84.7 84.8  PLT 314 267 281 289   Basic Metabolic Panel:  Recent Labs Lab 08/01/16 1546 08/02/16 0215 08/03/16 0326 08/04/16 0537  NA 141 142 136 140  K 3.6 3.5 3.9 3.6  CL 107 107 103 106  CO2 26 26 23 24   GLUCOSE 112* 95 208* 151*  BUN 15 14 16 15   CREATININE 0.87 0.86 0.82 0.75  CALCIUM 9.1 9.1 9.2 8.9  MG  --  1.9  --   --   PHOS  --  4.2  --   --    Liver Function Tests:  Recent Labs Lab 08/01/16 1546 08/02/16 0215  AST 29 26  ALT 27 24  ALKPHOS 63 56  BILITOT 0.6 0.7  PROT 7.1 6.1*  ALBUMIN 3.6 3.3*   Cardiac Enzymes:  Recent Labs Lab 08/01/16 2056 08/02/16 0215 08/02/16 1417  TROPONINI <0.03 <0.03 <0.03   Thyroid Function Tests:  Recent Labs  08/02/16 0215  TSH 2.954   Urine analysis:    Component Value Date/Time   COLORURINE YELLOW 08/01/2016 1604   APPEARANCEUR CLEAR 08/01/2016 1604   LABSPEC 1.013 08/01/2016 1604   PHURINE 6.0 08/01/2016 1604   GLUCOSEU NEGATIVE 08/01/2016 1604   GLUCOSEU NEGATIVE 09/21/2015 0957   HGBUR NEGATIVE 08/01/2016 1604   BILIRUBINUR NEGATIVE 08/01/2016 1604   BILIRUBINUR negative 06/22/2015 1046   KETONESUR NEGATIVE 08/01/2016 1604   PROTEINUR NEGATIVE 08/01/2016 1604   UROBILINOGEN 0.2 09/21/2015 0957   NITRITE NEGATIVE 08/01/2016 1604   LEUKOCYTESUR NEGATIVE 08/01/2016 1604   Radiology Studies: Dg Cervical Spine 2-3 Views  Result Date: 08/03/2016 CLINICAL DATA:  C4-5 and C5-6 ACDF. EXAM: CERVICAL SPINE - 2-3 VIEW COMPARISON:  Radiographs and MRI 08/02/2016. FINDINGS: Two cross-table lateral  views are submitted from the operating room. On the first image obtained at 0817 hours, there is an anterior localizing needle at the C4-5 disc space level. The second image at 0921 hours demonstrates interval anterior discectomy and fusion from C4 through C6 with an anterior plate, screws and intervertebral bone plugs. The inferior aspect of the hardware is not well visualized. Surgical sponges are present anteriorly within the surgical bed. No demonstrated complications. IMPRESSION: Intraoperative views during C4-6 ACDF. No demonstrated complication. Electronically Signed   By: Hilarie Fredrickson.D.  On: 08/03/2016 10:03   Dg Cervical Spine 2 Or 3 Views  Result Date: 08/02/2016 CLINICAL DATA:  Subacute onset of posterior neck pain. Initial encounter. EXAM: CERVICAL SPINE - 2-3 VIEW COMPARISON:  MRI of the cervical spine performed earlier today at 10:29 a.m. FINDINGS: There is no evidence of fracture or subluxation. Vertebral bodies demonstrate normal height and alignment. Multilevel disc space narrowing is noted along the lower cervical spine, with scattered anterior and posterior disc osteophyte complexes. Prevertebral soft tissues are within normal limits. The provided odontoid view demonstrates no significant abnormality. The visualized lung apices are clear. IMPRESSION: 1. No evidence of fracture or subluxation along the cervical spine. 2. Mild degenerative change along the lower cervical spine. Electronically Signed   By: Roanna Raider M.D.   On: 08/02/2016 18:32    Scheduled Meds: . Chlorhexidine Gluconate Cloth  6 each Topical Q0600  . dexamethasone  4 mg Intravenous Q6H  . enoxaparin (LOVENOX) injection  40 mg Subcutaneous Q24H  . gabapentin  600 mg Oral TID  . HYDROcodone-acetaminophen  1 tablet Oral Q6H  . losartan  50 mg Oral Daily  . metoprolol succinate  50 mg Oral Daily  . mupirocin ointment  1 application Nasal BID  . senna  1 tablet Oral BID  . sodium chloride flush  3 mL  Intravenous Q12H  . sodium chloride flush  3 mL Intravenous Q12H  . torsemide  10 mg Oral Daily   Continuous Infusions:   LOS: 3 days   Time spent: 20 minutes   Debbora Presto, MD [facility] Pager [phone]  If 7PM-7AM, please contact night-coverage [email or web address] Password [email or web address] 08/04/2016, 1:42 PM

## 2016-08-04 NOTE — [place] FL2 (Signed)
MEDICAID FL2 LEVEL OF CARE SCREENING TOOL     IDENTIFICATION  Patient Name: Briana Miller Birthdate: 02/13/1956 Sex: female Admission Date (Current Location): 08/01/2016  [facility] and [place] Number:  Producer, television/film/video and Address:  [facility]. [facility], [address], [place], [place] [address]      Provider Number: [id number]  Attending Physician Name and Address:  Dorothea Ogle, MD  Relative Name and Phone Number:       Current Level of Care: Hospital Recommended Level of Care: Skilled Nursing Facility Prior Approval Number:    Date Approved/Denied:   PASRR Number: [id number]  Discharge Plan: SNF    Current Diagnoses: Patient Active Problem List   Diagnosis Date Noted  . Cervical myelopathy (HCC)   . Peripheral edema 08/01/2016  . CHF exacerbation (HCC) 08/01/2016  . Debility 08/01/2016  . Decubitus ulcer 08/01/2016  . Low back pain 03/09/2016  . HTN (hypertension) 02/25/2016  . Right patella fracture 01/20/2016  . Wound of right lower extremity 01/20/2016  . Wound of left lower extremity 01/20/2016  . Arthritis 01/20/2016  . Preop cardiovascular exam 09/12/2015  . Venous stasis ulcers (HCC) 06/23/2015  . Overactive bladder 06/23/2015  . GERD (gastroesophageal reflux disease) 06/23/2015  . CHF (congestive heart failure) (HCC) 05/15/2015  . Preventative health care 03/23/2015  . Migraines 03/23/2015  . Borderline diabetes 05/18/2014  . Acute systolic congestive heart failure (HCC) 03/01/2014  . Hypertensive heart disease with CHF (congestive heart failure) (HCC) 06/02/2013  . Allergic rhinitis 06/01/2013  . Congestive dilated cardiomyopathy (HCC) 06/01/2013  . OSA (obstructive sleep apnea) 04/08/2013  . Family history of colon cancer 04/08/2013  . Severe obesity (BMI >= 40) (HCC) 04/08/2013    Orientation RESPIRATION BLADDER Height & Weight     Self, Time, Situation, Place  Normal Incontinent Weight: 250 lb 14.4 oz  (113.8 kg) Height:  5\' 5"  (165.1 cm)  BEHAVIORAL SYMPTOMS/MOOD NEUROLOGICAL BOWEL NUTRITION STATUS   (None)  (None) Continent Diet (Heart healthy)  AMBULATORY STATUS COMMUNICATION OF NEEDS Skin   Extensive Assist Verbally Bruising, Surgical wounds, Other (Comment), PU Stage and Appropriate Care (Blister)     PU Stage 3 Dressing:  (Left posterior ankle: Foam prn)                 Personal Care Assistance Level of Assistance  Bathing, Feeding, Dressing Bathing Assistance: Limited assistance Feeding assistance: Independent Dressing Assistance: Limited assistance     Functional Limitations Info  Sight, Hearing, Speech Sight Info: Adequate Hearing Info: Adequate Speech Info: Adequate    SPECIAL CARE FACTORS FREQUENCY  PT (By licensed PT), OT (By licensed OT), Blood pressure     PT Frequency: 5 x week OT Frequency: 5 x week            Contractures Contractures Info: Not present    Additional Factors Info  Code Status, Allergies, Isolation Precautions Code Status Info: Full Allergies Info: NKDA     Isolation Precautions Info: Contact     Current Medications (08/04/2016):  This is the current hospital active medication list Current Facility-Administered Medications  Medication Dose Route Frequency Provider Last Rate Last Dose  . acetaminophen (TYLENOL) tablet 650 mg  650 mg Oral Q4H PRN Barnett Abu, MD       Or  . acetaminophen (TYLENOL) suppository 650 mg  650 mg Rectal Q4H PRN Barnett Abu, MD      . albuterol (PROVENTIL) (2.5 MG/3ML) 0.083% nebulizer solution 2.5 mg  2.5  mg Inhalation Q6H PRN Therisa Doyne, MD      . ALPRAZolam Prudy Feeler) tablet 0.5 mg  0.5 mg Oral PRN Therisa Doyne, MD   0.5 mg at 08/03/16 2134  . bisacodyl (DULCOLAX) suppository 10 mg  10 mg Rectal Daily PRN Barnett Abu, MD      . Chlorhexidine Gluconate Cloth 2 % PADS 6 each  6 each Topical [id number] Dorothea Ogle, MD   6 each at 08/04/16 [phone]  . dexamethasone (DECADRON) injection 4 mg  4 mg  Intravenous Q6H Dorothea Ogle, MD   4 mg at 08/04/16 0528  . enoxaparin (LOVENOX) injection 40 mg  40 mg Subcutaneous Q24H Therisa Doyne, MD   40 mg at 08/03/16 2134  . gabapentin (NEURONTIN) capsule 600 mg  600 mg Oral TID Therisa Doyne, MD   600 mg at 08/04/16 1054  . HYDROcodone-acetaminophen (NORCO) 7.5-325 MG per tablet 1 tablet  1 tablet Oral Q6H Barnett Abu, MD   1 tablet at 08/04/16 0528  . HYDROcodone-acetaminophen (NORCO/VICODIN) 5-325 MG per tablet 1-2 tablet  1-2 tablet Oral Q4H PRN Barnett Abu, MD   1 tablet at 08/03/16 2136  . HYDROmorphone (DILAUDID) injection 1 mg  1 mg Intravenous Q2H PRN Barnett Abu, MD      . losartan (COZAAR) tablet 50 mg  50 mg Oral Daily Therisa Doyne, MD   50 mg at 08/04/16 1055  . menthol-cetylpyridinium (CEPACOL) lozenge 3 mg  1 lozenge Oral PRN Barnett Abu, MD       Or  . phenol (CHLORASEPTIC) mouth spray 1 spray  1 spray Mouth/Throat PRN Barnett Abu, MD      . methocarbamol (ROBAXIN) tablet 500 mg  500 mg Oral Q6H PRN Barnett Abu, MD       Or  . methocarbamol (ROBAXIN) 500 mg in dextrose 5 % 50 mL IVPB  500 mg Intravenous Q6H PRN Barnett Abu, MD      . metoprolol succinate (TOPROL-XL) 24 hr tablet 50 mg  50 mg Oral Daily Therisa Doyne, MD   50 mg at 08/04/16 1055  . mupirocin ointment (BACTROBAN) 2 % 1 application  1 application Nasal BID Dorothea Ogle, MD   1 application at 08/04/16 1057  . ondansetron (ZOFRAN) injection 4 mg  4 mg Intravenous Q4H PRN Barnett Abu, MD      . ondansetron [facility]) tablet 4 mg  4 mg Oral Q6H PRN Therisa Doyne, MD      . polyethylene glycol (MIRALAX / GLYCOLAX) packet 17 g  17 g Oral Daily PRN Barnett Abu, MD      . senna (SENOKOT) tablet 8.6 mg  1 tablet Oral BID Therisa Doyne, MD   8.6 mg at 08/04/16 1055  . sodium chloride flush (NS) 0.9 % injection 3 mL  3 mL Intravenous Q12H Therisa Doyne, MD   3 mL at 08/04/16 1100  . sodium chloride flush (NS) 0.9 % injection 3 mL  3 mL  Intravenous Q12H Barnett Abu, MD   3 mL at 08/04/16 1100  . sodium chloride flush (NS) 0.9 % injection 3 mL  3 mL Intravenous PRN Barnett Abu, MD      . torsemide [facility]) tablet 10 mg  10 mg Oral Daily Therisa Doyne, MD   10 mg at 08/04/16 1055  . traMADol (ULTRAM) tablet 50 mg  50 mg Oral Q6H PRN Therisa Doyne, MD   50 mg at 08/02/16 4098     Discharge Medications: Please see discharge summary for a list  of discharge medications.  Relevant Imaging Results:  Relevant Lab Results:   Additional Information SS#: [id number]  Margarito Liner, LCSW

## 2016-08-04 NOTE — Progress Notes (Signed)
Patient alert and oriented, slept during the night. Incision site clean, dry, without complication.  Will continue to monitor.   Chae Oommen, RN

## 2016-08-04 NOTE — Consult Note (Signed)
WOC Nurse wound consult note Reason for Consult: left LE wounds Patient is obese and non ambulatory.  She has two areas on her LLE that are affected.   Wound type: Trauma left lateral thigh; several areas of partial thickness skin loss, pink, dry; no drainage Trauma left posterior achilles; 1.5cm x 1.0cm x 0.2cm; pink, moist, minimal serosanguinous drainage Pressure Injury POA:No Measurement:see above Wound bed: see above Drainage (amount, consistency, odor) see above Periwound: intact  Dressing procedure/placement/frequency: Silicone foam to each site, to protect and insulate.  Discussed POC with patient and bedside nurse.  Re consult if needed, will not follow at this time. Thanks  Addasyn Mcbreen [employer], RN,CWOCN, CNS [phone])

## 2016-08-04 NOTE — Clinical Social Work Note (Signed)
Clinical Social Work Assessment  Patient Details  Name: Briana Miller MRN: [id number] Date of Birth: [birth year]  Date of referral:  08/04/16               Reason for consult:  Facility Placement, Discharge Planning                Permission sought to share information with:  Facility Sport and exercise psychologist, Family Supports Permission granted to share information::  Yes, Verbal Permission Granted  Name::     Fareedah Mahler  Agency::  SNF's  Relationship::  Husband  Contact Information:  [phone]  Housing/Transportation Living arrangements for the past 2 months:  Single Family Home Source of Information:  Patient, Medical Team Patient Interpreter Needed:  None Criminal Activity/Legal Involvement Pertinent to Current Situation/Hospitalization:  No - Comment as needed Significant Relationships:  Adult Children, Spouse Lives with:  Adult Children, Spouse Do you feel safe going back to the place where you live?  No Need for family participation in patient care:  Yes (Comment)  Care giving concerns:  PT recommending SNF once medically stable.  Social Worker assessment / plan:  CSW met with patient. No supports at bedside. CSW introduced role and explained that PT recommendations would be discussed. Patient is agreeable to SNF. First preference is the 16-bed facility at [facility]. Patient states that she has been there before after surgery. Second preference is [facility] and third is [facility]. No further concerns. CSW encouraged patient to contact CSW as needed. CSW will continue to follow patient for support and facilitate discharge to SNF once medically stable.  Employment status:  [employer] information:  Other (Comment Required) Nurse, mental health) PT Recommendations:  [place] / Referral to community resources:  [place]  Patient/Family's Response to care:  Patient agreeable to SNF placement. Patient's family  supportive and involved in patient's care. Patient appreciated social work intervention.  Patient/Family's Understanding of and Emotional Response to Diagnosis, Current Treatment, and Prognosis:  Patient appears to have a good understanding of the reason for admission. Patient appears happy with hospital care.  Emotional Assessment Appearance:  Appears stated age Attitude/Demeanor/Rapport:  Other (Pleasant) Affect (typically observed):  Accepting, Appropriate, Calm, Pleasant Orientation:  Oriented to Self, Oriented to Place, Oriented to  Time, Oriented to Situation Alcohol / Substance use:  Never Used Psych involvement (Current and /or in the community):  No (Comment)  Discharge Needs  Concerns to be addressed:  Childcare Concerns Readmission within the last 30 days:  No Current discharge risk:  Dependent with Mobility Barriers to Discharge:  Continued Medical Work up, [place], LCSW 08/04/2016, 11:14 AM

## 2016-08-04 NOTE — Telephone Encounter (Signed)
Team Care form signed and faxed to [phone] and [phone].  Disability forms faxed to [employer] At [phone]. Originals sent for scanning. FL2 form discarded as pt was admitted to the hospital on 08/01/16.

## 2016-08-05 LAB — BASIC METABOLIC PANEL
Anion gap: 8 (ref 5–15)
BUN: 20 mg/dL (ref 6–20)
CHLORIDE: 106 mmol/L (ref 101–111)
CO2: 27 mmol/L (ref 22–32)
Calcium: 8.8 mg/dL — ABNORMAL LOW (ref 8.9–10.3)
Creatinine, Ser: 0.72 mg/dL (ref 0.44–1.00)
GFR calc Af Amer: >60 mL/min (ref >60)
GFR calc non Af Amer: >60 mL/min (ref >60)
GLUCOSE: 153 mg/dL — AB (ref 65–99)
POTASSIUM: 3.9 mmol/L (ref 3.5–5.1)
SODIUM: 141 mmol/L (ref 135–145)

## 2016-08-05 LAB — CBC
HEMATOCRIT: 30 % — AB (ref 36.0–46.0)
Hemoglobin: 10.3 g/dL — ABNORMAL LOW (ref 12.0–15.0)
MCH: 29.1 pg (ref 26.0–34.0)
MCHC: 34.3 g/dL (ref 30.0–36.0)
MCV: 84.7 fL (ref 78.0–100.0)
Platelets: 299 10*3/uL (ref 150–400)
RBC: 3.54 MIL/uL — ABNORMAL LOW (ref 3.87–5.11)
RDW: 14.1 % (ref 11.5–15.5)
WBC: 10.2 10*3/uL (ref 4.0–10.5)

## 2016-08-05 MED ORDER — DEXAMETHASONE 2 MG PO TABS: 2.0000 mg | ORAL_TABLET | Freq: Two times a day (BID) | ORAL | 0 refills | Status: DC

## 2016-08-05 MED ORDER — DEXAMETHASONE 2 MG PO TABS
2.0000 mg | ORAL_TABLET | Freq: Two times a day (BID) | ORAL | Status: DC
  Administered 2016-08-05: 2 mg via ORAL
  Filled 2016-08-05 (×3): qty 1

## 2016-08-05 MED ORDER — HYDROCODONE-ACETAMINOPHEN 5-325 MG PO TABS: 1.0000 | ORAL_TABLET | ORAL | 0 refills | Status: DC | PRN

## 2016-08-05 MED ORDER — ALPRAZOLAM 0.5 MG PO TABS: 0.5000 mg | ORAL_TABLET | ORAL | 0 refills | Status: DC | PRN

## 2016-08-05 NOTE — Progress Notes (Signed)
Patient report called in to Tammy, nurse at [facility]. Elnita Maxwell, RN

## 2016-08-05 NOTE — Clinical Social Work Note (Addendum)
CSW discussed possible LOG with Surveyor, quantity of social work this morning. It was denied after chart review due to patient appearing to be custodial care. CSW and RNCM met with patient. Daughter at bedside. CSW explained that patient is discharged and there is not enough time to get insurance authorization. Patient and her daughter are adamant that she cannot return home. Patient's husband is a Administrator and will not get home until tonight and her daughter has to take care of her children at her own home. Patient upset about seemingly abrupt discharge. CSW discussed LOG with Surveyor, quantity of social work again and he advised CSW to Magazine features editor of social [employer]. 5-day LOG has been approved until insurance authorization can be approved. Two facilities are agreeable to taking LOG. Patient wants CSW to check on rehab facility at [facility]. CSW has called and left voicemail for admissions coordinator. Awaiting call back.  Dayton Scrape, CSW [phone]  1:19 pm The only rehab options at [facility] are outpatient PT and CIR. [facility] and [facility] have agreed to accept 5-day LOG. [facility] will call CSW in 15 minutes to give determination on whether they can accept LOG or not. Patient updated and stated that her daughter said [facility] is a nice facility.  Dayton Scrape, [place] [phone]  3:20 pm Patient has chosen [facility] and called the facility herself to inquire about placement. Patient and her husband are aware that PT won't be started at SNF until insurance authorization obtained and she will be in a semiprivate room but will be put on the list for a private room.   Dayton Scrape, [place]

## 2016-08-05 NOTE — Clinical Social Work Note (Signed)
CSW facilitated patient discharge including contacting patient family and facility to confirm patient discharge plans. Clinical information faxed to facility and family agreeable with plan. CSW arranged ambulance transport via [facility] to [facility]. RN to call report prior to discharge ([phone]).  CSW will sign off for now as social work intervention is no longer needed. Please consult us again if new needs arise.  Tyreisha Ungar, CSW [phone]   

## 2016-08-05 NOTE — Progress Notes (Signed)
Patient ID: Briana Miller, female   DOB: [birth year], 61 y.o.   MRN: [id number] Vital signs are stable Motor function appears be improving We'll need some rehabilitation and skilled nursing facility Okay for discharge Follow-up in 2-3 weeks

## 2016-08-05 NOTE — Discharge Summary (Signed)
Physician Discharge Summary  Briana Miller WUJ:[id number] DOB: Jun 13, 1956 DOA: 08/01/2016  PCP: Lemont Fillers., NP  Admit date: 08/01/2016 Discharge date: 08/05/2016  Recommendations for Outpatient Follow-up:  1. Pt will need to follow up with PCP in 1-2 weeks post discharge 2. Please obtain BMP to evaluate electrolytes and kidney function 3. Please also check CBC to evaluate Hg and Hct levels 4. Please make sure pt has a follow up appointment with Dr. Danielle Dess 5. Complete therapy with dexamethasone   Discharge Diagnoses:  Active Problems:   HTN (hypertension)   Peripheral edema   CHF exacerbation (HCC)   Debility   Decubitus ulcer   Cervical myelopathy (HCC)   Discharge Condition: Stable  Diet recommendation: Heart healthy diet discussed in details   Brief Narrative:   Pt is 61 yo female with known HTN, combined systolic and diastolic CHF (last ECHO in 2017 with EF 45% and grade II diastolic CHF), morbid obesity, rather chronic lower back and lower extremity pain that initially started back in July 2017 after an episode of fall, several visits to ED for concern of ongoing pain that interferes with her ability to ambulate. Her knees are contracted and she says that has been there for a while as well. Pt was discharged from SNF in October 2017 after being there for 2 months. Per pt, pain in the LE's constant and 10/10 in severity, no specific alleviating factors, associated with weakness and inability to ambulate. In addition, she reports beds sores and incontinence. Pt also reports bilaterally upper extremity pain and weakness, has difficulty controlling her movements. Pt denies chest pain, has occasional exertional dyspnea, no fevers, chills, no specific abd concerns.   Assessment & Plan:   Active Problems: Lower and upper extremity pain and weakness, Cervical spondylosis with myelopathy C4-5 C5-6. Paraparesis. - with confirmed cord compression on MRI study - s/p  Anterior cervical decompression C4-5 C5-6 arthrodesis with structural allograft, anterior plate fixation with nuvasive 38 mm plate, post op day #2 - pt reports feeling better, ready to start with PT/OT - SNF will be needed, pt unsafe to be discharged home  - continue Decadron for 5 more days post discharge   Left LE wounds present on admission, not due to pressure but rather caused by previous trauma - two areas on her LLE, left lateral thigh; several areas of partial thickness skin loss, pink, dry; no drainage - left posterior achilles; 1.5cm x 1.0cm x 0.2cm; pink, moist, minimal serosanguinous drainage - recommendations per [facility] team is to use silicone foam to each site, to protect and insulate. - appreciate assistance   Leukocytosis - reactive, post op - resolved   Hyperglycemia - steroid induced - outpatient follow up   Chronic Combined CHF - with LE edema and rather diminished breath sounds at bases but no signs of overt volume overload  - repeat ECHO with stable EF 45% - continue Torsemide  - weight trend since admission  Filed Weights   08/02/16 0446 08/03/16 0508 08/04/16 0539  Weight: 115.5 kg (254 lb 11.2 oz) 115 kg (253 lb 9.6 oz) 113.8 kg (250 lb 14.4 oz)   HTN - continue Metoprolol  - losartan stopped as BP was on low end of normal   Morbid obesity - Body mass index is 41.75 kg/m.  DVT prophylaxis: Lovenox SQ Code Status: Full  Family Communication: Patient at bedside, family at bedside  Disposition Plan: SNF  Consultants:   Neurosurgery   Procedures:   None  Antimicrobials:  None  Procedures/Studies: Dg Chest 2 View  Result Date: 08/01/2016 CLINICAL DATA:  Left groin pain, left leg swelling, weakness EXAM: CHEST  2 VIEW COMPARISON:  04/06/2014 FINDINGS: Cardiomediastinal silhouette is stable. No infiltrate or pleural effusion. No pulmonary edema. Extensive degenerative changes right shoulder. Moderate degenerative changes left shoulder.  Osteopenia and mild degenerative changes mid thoracic spine. IMPRESSION: No active cardiopulmonary disease. Degenerative changes bilateral shoulders right greater than left. Electronically Signed   By: Natasha Mead M.D.   On: 08/01/2016 15:58   Dg Cervical Spine 2-3 Views  Result Date: 08/03/2016 CLINICAL DATA:  C4-5 and C5-6 ACDF. EXAM: CERVICAL SPINE - 2-3 VIEW COMPARISON:  Radiographs and MRI 08/02/2016. FINDINGS: Two cross-table lateral views are submitted from the operating room. On the first image obtained at 0817 hours, there is an anterior localizing needle at the C4-5 disc space level. The second image at 0921 hours demonstrates interval anterior discectomy and fusion from C4 through C6 with an anterior plate, screws and intervertebral bone plugs. The inferior aspect of the hardware is not well visualized. Surgical sponges are present anteriorly within the surgical bed. No demonstrated complications. IMPRESSION: Intraoperative views during C4-6 ACDF. No demonstrated complication. Electronically Signed   By: Carey Bullocks M.D.   On: 08/03/2016 10:03   Dg Cervical Spine 2 Or 3 Views  Result Date: 08/02/2016 CLINICAL DATA:  Subacute onset of posterior neck pain. Initial encounter. EXAM: CERVICAL SPINE - 2-3 VIEW COMPARISON:  MRI of the cervical spine performed earlier today at 10:29 a.m. FINDINGS: There is no evidence of fracture or subluxation. Vertebral bodies demonstrate normal height and alignment. Multilevel disc space narrowing is noted along the lower cervical spine, with scattered anterior and posterior disc osteophyte complexes. Prevertebral soft tissues are within normal limits. The provided odontoid view demonstrates no significant abnormality. The visualized lung apices are clear. IMPRESSION: 1. No evidence of fracture or subluxation along the cervical spine. 2. Mild degenerative change along the lower cervical spine. Electronically Signed   By: Roanna Raider M.D.   On: 08/02/2016 18:32    Mr Cervical Spine W Wo Contrast  Result Date: 08/02/2016 CLINICAL DATA:  Profound lower extremity weakness. EXAM: MRI TOTAL SPINE WITHOUT AND WITH CONTRAST TECHNIQUE: Multisequence multi planar MR imaging of the spine from the cervical spine to the sacrum was performed prior to and following IV contrast administration. CONTRAST:  77mL MULTIHANCE GADOBENATE DIMEGLUMINE 529 MG/ML IV SOLN COMPARISON:  None. FINDINGS: MRI CERVICAL SPINE FINDINGS Alignment: Straightening without subluxation. Vertebrae: Altered marrow signal around the C4-5 and C5-6 degenerated endplates. Diffusely hypointense marrow without focal lesion. Cord: Degenerative compression at C4-5 with ill-defined T2 hyperintensity -edematous appearance. Posterior Fossa, vertebral arteries, paraspinal tissues: No acute finding. Disc levels: C2-3: Facet spurring on the right, mild.  No impingement C3-4: Asymmetric rightward endplate and uncovertebral ridging. Mild right facet spurring. No impingement C4-5: Disc degeneration with posterior disc osteophyte complex, predominately disc. There may be a superimposed broad disc protrusion. Right more than left uncovertebral spurring. Spinal stenosis with cord deformity and T2 hyperintensity. Biforaminal impingement, greater on the right. C5-6: Degenerative disc disease with disc osteophyte complex and uncovertebral ridging. Spinal stenosis is moderate. Small dorsal epidural T2 hyperintensity on sagittal acquisition of the level of C5 has no associated inflammatory changes or rim enhancement on postcontrast imaging. On the midline slice with the ligamentum flavum has a deficient appearance, but para median ligament is intact and unremarkable. There is no historical indication of trauma. C6-7: No impingement C7-T1:Unremarkable. MRI THORACIC  SPINE FINDINGS Alignment:  Unremarkable Vertebrae: Diffusely hypointense marrow. Negative for acute fracture, discitis, or aggressive bone lesion. Cord:  Normal signal and  morphology. Paraspinal and other soft tissues: Small area of signal abnormality in the deep dependent right lung is presumably atelectasis in this setting. Disc levels: Diffuse mild disc bulging.  No impingement or notable herniation. MRI LUMBAR SPINE FINDINGS Segmentation:  Standard. Alignment:  Facet mediated mild grade 1 anterolisthesis at L4-5. Vertebrae: Diffusely hypointense marrow. Negative for acute fracture, discitis, or aggressive bone lesion. Conus medullaris: Extends to the L1 level and appears normal. Paraspinal and other soft tissues: Diffuse subcutaneous edema. Disc levels: T12- L1: Unremarkable. L1-L2: Mild facet spurring.  No herniation or impingement L2-L3: Facet arthropathy with moderate posterior element overgrowth. Mild disc bulging. No impingement L3-L4: Prominent facet arthropathy with spurring and left-sided joint effusion. Mild disc bulging. No compressive stenosis. L4-L5: Advanced facet arthropathy with slight anterolisthesis. Bilateral joint effusion. Disc bulging. Inferior foraminal narrowing greater on the right without L4 compression. Moderate spinal stenosis. L5-S1:Facet arthropathy with mild to moderate spurring. No herniation or impingement. These results were called by telephone at the time of interpretation on 08/02/2016 at 1:23 pm to Dr. Izola Price who verbally acknowledged these results. Diffusely motion degraded MRI which could affect sensitivity of small or subtle findings. IMPRESSION: 1. C4-5 and C5-6 spinal stenosis from disc degeneration. At C4-5 there is cord flattening and cord edema. 2. C4-5 advanced right foraminal stenosis. 3. No impingement or signs of myelopathy in the thoracic spine. 4. Lumbar spine shows prominent facet arthropathy, especially at L3-4 and L4-5. Moderate spinal stenosis at L4-5. 5. Diffusely hypointense marrow, often from anemia or chronic hypoxemia. Correlate with CBC. 6. Motion degraded exam. Electronically Signed   By: Marnee Spring M.D.   On:  08/02/2016 13:30   Mr Thoracic Spine W Wo Contrast  Result Date: 08/02/2016 CLINICAL DATA:  Profound lower extremity weakness. EXAM: MRI TOTAL SPINE WITHOUT AND WITH CONTRAST TECHNIQUE: Multisequence multi planar MR imaging of the spine from the cervical spine to the sacrum was performed prior to and following IV contrast administration. CONTRAST:  20mL MULTIHANCE GADOBENATE DIMEGLUMINE 529 MG/ML IV SOLN COMPARISON:  None. FINDINGS: MRI CERVICAL SPINE FINDINGS Alignment: Straightening without subluxation. Vertebrae: Altered marrow signal around the C4-5 and C5-6 degenerated endplates. Diffusely hypointense marrow without focal lesion. Cord: Degenerative compression at C4-5 with ill-defined T2 hyperintensity -edematous appearance. Posterior Fossa, vertebral arteries, paraspinal tissues: No acute finding. Disc levels: C2-3: Facet spurring on the right, mild.  No impingement C3-4: Asymmetric rightward endplate and uncovertebral ridging. Mild right facet spurring. No impingement C4-5: Disc degeneration with posterior disc osteophyte complex, predominately disc. There may be a superimposed broad disc protrusion. Right more than left uncovertebral spurring. Spinal stenosis with cord deformity and T2 hyperintensity. Biforaminal impingement, greater on the right. C5-6: Degenerative disc disease with disc osteophyte complex and uncovertebral ridging. Spinal stenosis is moderate. Small dorsal epidural T2 hyperintensity on sagittal acquisition of the level of C5 has no associated inflammatory changes or rim enhancement on postcontrast imaging. On the midline slice with the ligamentum flavum has a deficient appearance, but para median ligament is intact and unremarkable. There is no historical indication of trauma. C6-7: No impingement C7-T1:Unremarkable. MRI THORACIC SPINE FINDINGS Alignment:  Unremarkable Vertebrae: Diffusely hypointense marrow. Negative for acute fracture, discitis, or aggressive bone lesion. Cord:   Normal signal and morphology. Paraspinal and other soft tissues: Small area of signal abnormality in the deep dependent right lung is presumably atelectasis in this  setting. Disc levels: Diffuse mild disc bulging.  No impingement or notable herniation. MRI LUMBAR SPINE FINDINGS Segmentation:  Standard. Alignment:  Facet mediated mild grade 1 anterolisthesis at L4-5. Vertebrae: Diffusely hypointense marrow. Negative for acute fracture, discitis, or aggressive bone lesion. Conus medullaris: Extends to the L1 level and appears normal. Paraspinal and other soft tissues: Diffuse subcutaneous edema. Disc levels: T12- L1: Unremarkable. L1-L2: Mild facet spurring.  No herniation or impingement L2-L3: Facet arthropathy with moderate posterior element overgrowth. Mild disc bulging. No impingement L3-L4: Prominent facet arthropathy with spurring and left-sided joint effusion. Mild disc bulging. No compressive stenosis. L4-L5: Advanced facet arthropathy with slight anterolisthesis. Bilateral joint effusion. Disc bulging. Inferior foraminal narrowing greater on the right without L4 compression. Moderate spinal stenosis. L5-S1:Facet arthropathy with mild to moderate spurring. No herniation or impingement. These results were called by telephone at the time of interpretation on 08/02/2016 at 1:23 pm to Dr. Izola Price who verbally acknowledged these results. Diffusely motion degraded MRI which could affect sensitivity of small or subtle findings. IMPRESSION: 1. C4-5 and C5-6 spinal stenosis from disc degeneration. At C4-5 there is cord flattening and cord edema. 2. C4-5 advanced right foraminal stenosis. 3. No impingement or signs of myelopathy in the thoracic spine. 4. Lumbar spine shows prominent facet arthropathy, especially at L3-4 and L4-5. Moderate spinal stenosis at L4-5. 5. Diffusely hypointense marrow, often from anemia or chronic hypoxemia. Correlate with CBC. 6. Motion degraded exam. Electronically Signed   By: Marnee Spring  M.D.   On: 08/02/2016 13:30   Mr Lumbar Spine W Wo Contrast  Result Date: 08/02/2016 CLINICAL DATA:  Profound lower extremity weakness. EXAM: MRI TOTAL SPINE WITHOUT AND WITH CONTRAST TECHNIQUE: Multisequence multi planar MR imaging of the spine from the cervical spine to the sacrum was performed prior to and following IV contrast administration. CONTRAST:  20mL MULTIHANCE GADOBENATE DIMEGLUMINE 529 MG/ML IV SOLN COMPARISON:  None. FINDINGS: MRI CERVICAL SPINE FINDINGS Alignment: Straightening without subluxation. Vertebrae: Altered marrow signal around the C4-5 and C5-6 degenerated endplates. Diffusely hypointense marrow without focal lesion. Cord: Degenerative compression at C4-5 with ill-defined T2 hyperintensity -edematous appearance. Posterior Fossa, vertebral arteries, paraspinal tissues: No acute finding. Disc levels: C2-3: Facet spurring on the right, mild.  No impingement C3-4: Asymmetric rightward endplate and uncovertebral ridging. Mild right facet spurring. No impingement C4-5: Disc degeneration with posterior disc osteophyte complex, predominately disc. There may be a superimposed broad disc protrusion. Right more than left uncovertebral spurring. Spinal stenosis with cord deformity and T2 hyperintensity. Biforaminal impingement, greater on the right. C5-6: Degenerative disc disease with disc osteophyte complex and uncovertebral ridging. Spinal stenosis is moderate. Small dorsal epidural T2 hyperintensity on sagittal acquisition of the level of C5 has no associated inflammatory changes or rim enhancement on postcontrast imaging. On the midline slice with the ligamentum flavum has a deficient appearance, but para median ligament is intact and unremarkable. There is no historical indication of trauma. C6-7: No impingement C7-T1:Unremarkable. MRI THORACIC SPINE FINDINGS Alignment:  Unremarkable Vertebrae: Diffusely hypointense marrow. Negative for acute fracture, discitis, or aggressive bone lesion.  Cord:  Normal signal and morphology. Paraspinal and other soft tissues: Small area of signal abnormality in the deep dependent right lung is presumably atelectasis in this setting. Disc levels: Diffuse mild disc bulging.  No impingement or notable herniation. MRI LUMBAR SPINE FINDINGS Segmentation:  Standard. Alignment:  Facet mediated mild grade 1 anterolisthesis at L4-5. Vertebrae: Diffusely hypointense marrow. Negative for acute fracture, discitis, or aggressive bone lesion. Conus medullaris: Extends  to the L1 level and appears normal. Paraspinal and other soft tissues: Diffuse subcutaneous edema. Disc levels: T12- L1: Unremarkable. L1-L2: Mild facet spurring.  No herniation or impingement L2-L3: Facet arthropathy with moderate posterior element overgrowth. Mild disc bulging. No impingement L3-L4: Prominent facet arthropathy with spurring and left-sided joint effusion. Mild disc bulging. No compressive stenosis. L4-L5: Advanced facet arthropathy with slight anterolisthesis. Bilateral joint effusion. Disc bulging. Inferior foraminal narrowing greater on the right without L4 compression. Moderate spinal stenosis. L5-S1:Facet arthropathy with mild to moderate spurring. No herniation or impingement. These results were called by telephone at the time of interpretation on 08/02/2016 at 1:23 pm to Dr. Izola Price who verbally acknowledged these results. Diffusely motion degraded MRI which could affect sensitivity of small or subtle findings. IMPRESSION: 1. C4-5 and C5-6 spinal stenosis from disc degeneration. At C4-5 there is cord flattening and cord edema. 2. C4-5 advanced right foraminal stenosis. 3. No impingement or signs of myelopathy in the thoracic spine. 4. Lumbar spine shows prominent facet arthropathy, especially at L3-4 and L4-5. Moderate spinal stenosis at L4-5. 5. Diffusely hypointense marrow, often from anemia or chronic hypoxemia. Correlate with CBC. 6. Motion degraded exam. Electronically Signed   By: Marnee Spring M.D.   On: 08/02/2016 13:30   Dg Hips Bilat With Pelvis 2v  Result Date: 08/02/2016 CLINICAL DATA:  Weakness and BILATERAL lower extremity edema since July 2017, LEFT groin pain and swelling, BILATERAL knee contractures, history CHF, cardiomyopathy, overactive bladder, heart failure EXAM: DG HIP (WITH OR WITHOUT PELVIS) 2V BILAT COMPARISON:  None FINDINGS: Mild osseous demineralization. Hip and SI joint spaces preserved. No acute fracture, dislocation, or bone destruction. IMPRESSION: No acute osseous abnormalities. Electronically Signed   By: Ulyses Southward M.D.   On: 08/02/2016 13:43     Discharge Exam: Vitals:   08/05/16 0447 08/05/16 0900  BP: (!) 123/44 (!) 107/47  Pulse: 65 80  Resp: 18 18  Temp: 98.4 F (36.9 C) 98.4 F (36.9 C)   Vitals:   08/04/16 1710 08/04/16 2009 08/05/16 0447 08/05/16 0900  BP: (!) 108/55 (!) 128/57 (!) 123/44 (!) 107/47  Pulse: 81 74 65 80  Resp: 16 16 18 18   Temp: 98.6 F (37 C) 98.2 F (36.8 C) 98.4 F (36.9 C) 98.4 F (36.9 C)  TempSrc: Oral Oral Oral Oral  SpO2: 98% 97% 97% 96%  Weight:   114.6 kg (252 lb 11.2 oz)   Height:        General: Pt is alert, follows commands appropriately, not in acute distress Cardiovascular: Regular rate and rhythm, S1/S2 +, no murmurs, no rubs, no gallops Respiratory: Clear to auscultation bilaterally, no wheezing, no crackles, no rhonchi Abdominal: Soft, non tender, non distended, bowel sounds +, no guarding  Discharge Instructions  Discharge Instructions    Diet - low sodium heart healthy    Complete by:  As directed    Incentive spirometry RT    Complete by:  As directed    Increase activity slowly    Complete by:  As directed      Allergies as of 08/05/2016   No Known Allergies     Medication List    STOP taking these medications   diclofenac sodium 1 % Gel Commonly known as:  VOLTAREN   losartan 50 MG tablet Commonly known as:  COZAAR     TAKE these medications   albuterol 108 (90  Base) MCG/ACT inhaler Commonly known as:  PROVENTIL HFA;VENTOLIN HFA Inhale 2 puffs into the  lungs every 6 (six) hours as needed for wheezing or shortness of breath.   ALPRAZolam 0.5 MG tablet Commonly known as:  XANAX Take 1 tablet (0.5 mg total) by mouth as needed for anxiety (on call to MRI).   calcium-vitamin D 500-200 MG-UNIT tablet Commonly known as:  OSCAL WITH D Take 1 tablet by mouth daily.   dexamethasone 2 MG tablet Commonly known as:  DECADRON Take 1 tablet (2 mg total) by mouth 2 (two) times daily.   gabapentin 300 MG capsule Commonly known as:  NEURONTIN Take 2 capsules (600 mg total) by mouth 3 (three) times daily.   HYDROcodone-acetaminophen 5-325 MG tablet Commonly known as:  NORCO/VICODIN Take 1-2 tablets by mouth every 4 (four) hours as needed (breakthrough pain).   metoprolol succinate 50 MG 24 hr tablet Commonly known as:  TOPROL-XL TAKE 1 TABLET BY MOUTH EVERY DAY   torsemide 10 MG tablet Commonly known as:  DEMADEX TAKE 1 TABLET BY MOUTH EVERY DAY      Follow-up Information    O'SULLIVAN,[name] S., NP Follow up.   Specialty:  Internal Medicine Contact information: [address] [place] [address] [phone]        Stefani Dama, MD Follow up.   Specialty:  Neurosurgery Contact information: [address] [place] [address] [phone]            The results of significant diagnostics from this hospitalization (including imaging, microbiology, ancillary and laboratory) are listed below for reference.     Microbiology: Recent Results (from the past 240 hour(s))  Surgical pcr screen     Status: Abnormal   Collection Time: 08/02/16  5:54 PM  Result Value Ref Range Status   MRSA, PCR POSITIVE (A) NEGATIVE Final    Comment: CRITICAL RESULT CALLED TO, READ BACK BY AND VERIFIED WITH: EGlendon Axe, RN 08/02/16 AT 1935 BY J FUDESCO    Staphylococcus aureus POSITIVE (A) NEGATIVE Final     Comment:        The Xpert SA Assay (FDA approved for NASAL specimens in patients over 70 years of age), is one component of a comprehensive surveillance program.  Test performance has been validated by [facility] for patients greater than or equal to 69 year old. It is not intended to diagnose infection nor to guide or monitor treatment. CRITICAL RESULT CALLED TO, READ BACK BY AND VERIFIED WITH: Lynita Lombard, RN, 08/02/16 AT 1935 BY J FUDESCO      Labs: Basic Metabolic Panel:  Recent Labs Lab 08/01/16 1546 08/02/16 0215 08/03/16 0326 08/04/16 0537 08/05/16 0541  NA 141 142 136 140 141  K 3.6 3.5 3.9 3.6 3.9  CL 107 107 103 106 106  CO2 26 26 23 24 27   GLUCOSE 112* 95 208* 151* 153*  BUN 15 14 16 15 20   CREATININE 0.87 0.86 0.82 0.75 0.72  CALCIUM 9.1 9.1 9.2 8.9 8.8*  MG  --  1.9  --   --   --   PHOS  --  4.2  --   --   --    Liver Function Tests:  Recent Labs Lab 08/01/16 1546 08/02/16 0215  AST 29 26  ALT 27 24  ALKPHOS 63 56  BILITOT 0.6 0.7  PROT 7.1 6.1*  ALBUMIN 3.6 3.3*   CBC:  Recent Labs Lab 08/01/16 1546 08/02/16 0215 08/03/16 0326 08/04/16 0537 08/05/16 0541  WBC 6.5 6.7 5.2 11.9* 10.2  NEUTROABS 3.3  --   --   --   --  HGB 11.3* 10.3* 11.1* 10.3* 10.3*  HCT 32.6* 30.2* 32.2* 30.1* 30.0*  MCV 84.2 84.6 84.7 84.8 84.7  PLT 314 267 281 289 299   Cardiac Enzymes:  Recent Labs Lab 08/01/16 2056 08/02/16 0215 08/02/16 1417  TROPONINI <0.03 <0.03 <0.03   BNP: BNP (last 3 results)  Recent Labs  08/01/16 1546  BNP 112.7*    SIGNED: Time coordinating discharge: 30 minutes  Debbora Presto, MD  [facility] 08/05/2016, 10:34 AM Pager [phone]  If 7PM-7AM, please contact night-coverage [email or web address] Password [email or web address]

## 2016-08-05 NOTE — Progress Notes (Signed)
Patient discharged to [facility] with all her belongings.  Ambulance here to pick up.  No complaints voiced at this time. Elnita Maxwell, RN

## 2016-08-05 NOTE — Discharge Instructions (Signed)
Back Pain, Adult Introduction Back pain is very common. The pain often gets better over time. The cause of back pain is usually not dangerous. Most people can learn to manage their back pain on their own. Follow these instructions at home: Watch your back pain for any changes. The following actions may help to lessen any pain you are feeling:  Stay active. Start with short walks on flat ground if you can. Try to walk farther each day.  Exercise regularly as told by your doctor. Exercise helps your back heal faster. It also helps avoid future injury by keeping your muscles strong and flexible.  Do not sit, drive, or stand in one place for more than 30 minutes.  Do not stay in bed. Resting more than 1-2 days can slow down your recovery.  Be careful when you bend or lift an object. Use good form when lifting:  Bend at your knees.  Keep the object close to your body.  Do not twist.  Sleep on a firm mattress. Lie on your side, and bend your knees. If you lie on your back, put a pillow under your knees.  Take medicines only as told by your doctor.  Put ice on the injured area.  Put ice in a plastic bag.  Place a towel between your skin and the bag.  Leave the ice on for 20 minutes, 2-3 times a day for the first 2-3 days. After that, you can switch between ice and heat packs.  Avoid feeling anxious or stressed. Find good ways to deal with stress, such as exercise.  Maintain a healthy weight. Extra weight puts stress on your back. Contact a doctor if:  You have pain that does not go away with rest or medicine.  You have worsening pain that goes down into your legs or buttocks.  You have pain that does not get better in one week.  You have pain at night.  You lose weight.  You have a fever or chills. Get help right away if:  You cannot control when you poop (bowel movement) or pee (urinate).  Your arms or legs feel weak.  Your arms or legs lose feeling  (numbness).  You feel sick to your stomach (nauseous) or throw up (vomit).  You have belly (abdominal) pain.  You feel like you may pass out (faint). This information is not intended to replace advice given to you by your health care provider. Make sure you discuss any questions you have with your health care provider. Document Released: 11/19/2007 Document Revised: 11/08/2015 Document Reviewed: 10/04/2013  2017 Elsevier  

## 2016-08-05 NOTE — Clinical Social Work Placement (Signed)
   CLINICAL SOCIAL WORK PLACEMENT  NOTE  Date:  08/05/2016  Patient Details  Name: Briana Miller MRN: [id number] Date of Birth: 05/24/1956  Clinical Social Work is seeking post-discharge placement for this patient at the Skilled  Nursing Facility level of care (*CSW will initial, date and re-position this form in  chart as items are completed):  Yes   Patient/family provided with [place] Clinical Social Work Department's list of facilities offering this level of care within the geographic area requested by the patient (or if unable, by the patient's family).  Yes   Patient/family informed of their freedom to choose among providers that offer the needed level of care, that participate in Medicare, Medicaid or managed care program needed by the patient, have an available bed and are willing to accept the patient.  Yes   Patient/family informed of [facility]'s ownership interest in [facility] and [facility], as well as of the fact that they are under no obligation to receive care at these facilities.  PASRR submitted to EDS on 08/04/16     PASRR number received on       Existing PASRR number confirmed on 08/04/16     FL2 transmitted to all facilities in geographic area requested by pt/family on 08/04/16     FL2 transmitted to all facilities within larger geographic area on       Patient informed that his/her managed care company has contracts with or will negotiate with certain facilities, including the following:        Yes   Patient/family informed of bed offers received.  Patient chooses bed at [facility]     Physician recommends and patient chooses bed at      Patient to be transferred to [facility] on 08/05/16.  Patient to be transferred to facility by [facility]     Patient family notified on 08/05/16 of transfer.  Name of family member notified:  Jennet Maduro     PHYSICIAN Please prepare prescriptions     Additional Comment:     _______________________________________________ Margarito Liner, LCSW 08/05/2016, 3:21 PM

## 2016-08-07 ENCOUNTER — Other Ambulatory Visit: Payer: Self-pay | Admitting: Cardiology

## 2016-08-07 NOTE — Telephone Encounter (Addendum)
Relation to PY:PPJK Call back number:[phone]   Reason for call:  Patient would like disability forms to reflect return back to work in 3 months due to financial concerns, please advise

## 2016-08-08 NOTE — Telephone Encounter (Signed)
Received call back from [employer]. States she does not have form on hand. Will give it a little more time to be scanned. Forms could be in route. Will check first of next week.

## 2016-08-08 NOTE — Telephone Encounter (Signed)
Can we try to pull these forms and I will see if I can update?

## 2016-08-08 NOTE — Telephone Encounter (Signed)
Forms not scanned into [employer] yet. Called [facility] and spoke with Arriane. She will look into this and call us back with status.

## 2016-08-09 ENCOUNTER — Other Ambulatory Visit: Payer: Self-pay | Admitting: Cardiology

## 2016-08-11 NOTE — Telephone Encounter (Signed)
Rx(s) sent to pharmacy electronically.  

## 2016-08-12 NOTE — Telephone Encounter (Signed)
Spoke with pt. She requested that 3 months also be indicated on the [facility] form. Reviewed form and verified that it states 3 months. Pt has been made aware.

## 2016-08-12 NOTE — Telephone Encounter (Signed)
Pulled disability form. On original form, PCP indicated that pt could return to work in 3 months. Left message on below voicemail for pt to return my call.

## 2016-08-13 ENCOUNTER — Telehealth: Payer: Self-pay | Admitting: Family

## 2016-08-13 NOTE — Telephone Encounter (Signed)
Spoke with pt. [employer] need Dx code for claim.

## 2016-08-13 NOTE — Telephone Encounter (Signed)
Caller name: Relationship to patient: Self Can be reached: [phone] Pharmacy:  Reason for call: Patient request call back to ask question about her disability form.

## 2016-08-14 NOTE — Telephone Encounter (Signed)
G95.9 Cervical Myelopathy is diagnosis.

## 2016-08-15 ENCOUNTER — Inpatient Hospital Stay: Payer: BLUE CROSS/BLUE SHIELD | Admitting: Family

## 2016-08-15 NOTE — Telephone Encounter (Signed)
Faxed paper to [employer] with DX code.

## 2016-08-19 NOTE — Telephone Encounter (Signed)
Patient states she was unaware forms needed to be sign by the covering physician, please advise

## 2016-08-20 NOTE — Telephone Encounter (Signed)
Spoke with pt. She states she was told that the forms for [employer] need a supervising MD signature. Pages flagged and placed in Dr Mariel Aloe red folder for signature with note to return to me to fax once completed.

## 2016-08-22 ENCOUNTER — Encounter (HOSPITAL_COMMUNITY): Payer: Self-pay | Admitting: Neurological Surgery

## 2016-08-22 NOTE — Telephone Encounter (Signed)
Angie / Ashlee-- did these forms come back to either of you to fax?  I did not get them back.

## 2016-08-26 NOTE — Telephone Encounter (Signed)
Received completed forms back from supervising MD and faxed to [employer] at [phone].

## 2016-09-02 ENCOUNTER — Other Ambulatory Visit: Payer: Self-pay | Admitting: Cardiology

## 2016-10-10 ENCOUNTER — Telehealth: Payer: Self-pay | Admitting: Family

## 2016-10-10 NOTE — Telephone Encounter (Signed)
Shiquita-- please call pt on Monday and schedule a follow up in our office (30 minutes). Thank you!  Left detailed message on Pamela's voicemail and to facilitate follow up with [place].

## 2016-10-10 NOTE — Telephone Encounter (Signed)
Yes please.   Pt also needs OV if she is being discharged from the facility please.

## 2016-10-10 NOTE — Telephone Encounter (Signed)
Caller name: Rinaldo Cloud Relationship to patient: [facility] Can be reached: [phone] Pharmacy:  Reason for call: Request verbal orders for Home Health Nursing 1 time a week for 9 weeks. (May leave message on VM)

## 2016-10-15 ENCOUNTER — Inpatient Hospital Stay: Payer: BLUE CROSS/BLUE SHIELD | Admitting: Family

## 2016-10-15 NOTE — Telephone Encounter (Signed)
Pt has been scheduled.  °

## 2016-10-21 ENCOUNTER — Telehealth: Payer: Self-pay | Admitting: Family

## 2016-10-21 ENCOUNTER — Ambulatory Visit (INDEPENDENT_AMBULATORY_CARE_PROVIDER_SITE_OTHER): Payer: BLUE CROSS/BLUE SHIELD | Admitting: Family

## 2016-10-21 ENCOUNTER — Encounter: Payer: Self-pay | Admitting: Family

## 2016-10-21 VITALS — BP 117/86 | Temp 98.4°F | Resp 18 | Ht 65.0 in

## 2016-10-21 DIAGNOSIS — M25569 Pain in unspecified knee: Secondary | ICD-10-CM | POA: Diagnosis not present

## 2016-10-21 DIAGNOSIS — I509 Heart failure, unspecified: Secondary | ICD-10-CM | POA: Diagnosis not present

## 2016-10-21 DIAGNOSIS — I10 Essential (primary) hypertension: Secondary | ICD-10-CM

## 2016-10-21 DIAGNOSIS — G959 Disease of spinal cord, unspecified: Secondary | ICD-10-CM

## 2016-10-21 DIAGNOSIS — R400 Somnolence: Secondary | ICD-10-CM

## 2016-10-21 LAB — BASIC METABOLIC PANEL
BUN: 11 mg/dL (ref 6–23)
CALCIUM: 9.6 mg/dL (ref 8.4–10.5)
CO2: 27 mEq/L (ref 19–32)
CREATININE: 0.75 mg/dL (ref 0.40–1.20)
Chloride: 107 mEq/L (ref 96–112)
GFR: 101.78 mL/min (ref >60.00)
Glucose, Bld: 125 mg/dL — ABNORMAL HIGH (ref 70–99)
Potassium: 3.8 mEq/L (ref 3.5–5.1)
Sodium: 141 mEq/L (ref 135–145)

## 2016-10-21 MED ORDER — METOPROLOL SUCCINATE ER 50 MG PO TB24: 50.0000 mg | ORAL_TABLET | Freq: Every day | ORAL | 1 refills | Status: DC

## 2016-10-21 MED ORDER — MELOXICAM 7.5 MG PO TABS: 7.5000 mg | ORAL_TABLET | Freq: Every day | ORAL | 1 refills | Status: DC | PRN

## 2016-10-21 MED ORDER — RANITIDINE HCL 150 MG PO TABS: 150.0000 mg | ORAL_TABLET | Freq: Every day | ORAL | Status: DC

## 2016-10-21 MED ORDER — TORSEMIDE 10 MG PO TABS: 10.0000 mg | ORAL_TABLET | ORAL | 1 refills | Status: DC

## 2016-10-21 MED ORDER — LOSARTAN POTASSIUM 50 MG PO TABS: 50.0000 mg | ORAL_TABLET | Freq: Every day | ORAL | 1 refills | Status: DC

## 2016-10-21 NOTE — Telephone Encounter (Signed)
Please contact patient and ask her if she is currently using cpap. If not, I would like to send her for a follow up sleep study.

## 2016-10-21 NOTE — Assessment & Plan Note (Signed)
She is wheelchair bound. Has some UE use, but drops things easily.  She continues to work with PT. She tells me she is able to pivot on her own to use a bedside commode. This is an improvement from prior to her surgery. I have advised pt to keep her upcoming appointment with Dr. Danielle Dess for follow up.

## 2016-10-21 NOTE — Progress Notes (Signed)
Subjective:    Patient ID: Briana Miller, female    DOB: 11-18-1955, 62 y.o.   MRN: [id number]  HPI  Briana Miller is a 61 yr old female who presents today for follow up. We last saw patient on 08/01/16 and she presented with weakness/falls/lower extremity edema/incontinence and weakness.  We sent the patient through the ER.  She underwent additional imaging and was found t have cervical spondylosis with myelopath C4-5 C5-6.  She underwent Anterior cervical decompression with Dr. Danielle Dess. She was discharged to a rehab facility and was ultimately discharged home with home health 2 weeks ago.   She had LLE wounds on admission at that time.   Patient reports that wounds are now healed.   HTN- maintained on losartan and metoprolol. She denies CP/SOB, does have some LE edema.  BP Readings from Last 3 Encounters:  10/21/16 117/86  08/05/16 117/85  08/01/16 132/78   She is maintained on torsemide she reports that she is only taking Monday/Wednesday/Friday.  She reports that her LE edema began last week.    Reports that she has chronic knee pain. Uses the hydrocodone once a day. This was given to her by the rehab doctor.   Review of Systems  Gastrointestinal:       Denies recent gerd symptoms.    Past Medical History:  Diagnosis Date  . Acute systolic congestive heart failure (HCC) 03/01/2014  . Allergy   . Anemia   . Arthritis   . Cardiomyopathy (HCC)   . CHF (congestive heart failure) (HCC)   . GERD (gastroesophageal reflux disease)   . Heart disease   . Hyperlipidemia   . Hypertension   . Leg pain   . Overactive bladder      Social History   Social History  . Marital status: Married    Spouse name: N/A  . Number of children: 2  . Years of education: HS   Occupational History  . [employer]   Social History Main Topics  . Smoking status: Never Smoker  . Smokeless tobacco: Never Used  . Alcohol use 1.2 oz/week    2 Glasses of wine per week  . Drug use: No    . Sexual activity: Not on file   Other Topics Concern  . 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   1-2 cups caffeine daily.   Lives at home with her husband.       Past Surgical History:  Procedure Laterality Date  . ANTERIOR CERVICAL DECOMP/DISCECTOMY FUSION N/A 08/03/2016   Procedure: ANTERIOR CERVICAL DECOMPRESSION/DISCECTOMY FUSION C4-5, C5-6;  Surgeon: Barnett Abu, MD;  Location: [facility] OR;  Service: Neurosurgery;  Laterality: N/A;  . COLONOSCOPY    . KNEE ARTHROSCOPY     2007  . ROTATOR CUFF REPAIR Right 03/23/13  . TOTAL KNEE ARTHROPLASTY  09/22/2011   Procedure: TOTAL KNEE ARTHROPLASTY;  Surgeon: Raymon Mutton, MD;  Location: [facility] OR;  Service: Orthopedics;  Laterality: Right;    Family History  Problem Relation Age of Onset  . Cancer Mother     colon 94 and breast 64- deceased  . Colon cancer Mother   . Diabetes Father  living  . Asthma Father   . Hypertension Father   . Heart attack Father 47    medical management per pt  . Glaucoma Father     had eye transplant  . Kidney failure Father     acute renal failure- died 63  . Esophageal cancer Neg Hx   . Stomach cancer Neg Hx   . Rectal cancer Neg Hx     No Known Allergies  Current Outpatient Prescriptions on File Prior to Visit  Medication Sig Dispense Refill  . calcium-vitamin D (OSCAL WITH D) 500-200 MG-UNIT per tablet Take 1 tablet by mouth daily.    Marland Kitchen gabapentin (NEURONTIN) 300 MG capsule Take 2 capsules (600 mg total) by mouth 3 (three) times daily. 180 capsule 11   No current facility-administered medications on file prior to visit.     BP 117/86 (BP Location: Left Arm, Cuff Size: Large)   Temp 98.4 F (36.9 C) (Oral)   Resp 18    Ht 5\' 5"  (1.651 m)   LMP 06/17/2007   SpO2 99%       Objective:   Physical Exam  Constitutional: She appears well-developed and well-nourished.  Cardiovascular: Normal rate, regular rhythm and normal heart sounds.   No murmur heard. Pulmonary/Chest: Effort normal and breath sounds normal. No respiratory distress. She has no wheezes.  Musculoskeletal:  2+ bilateral LE edema.  Neurological:  Bilateral UE strength and hand grasps are diminished.  RLE/LLE strength is diminished  Psychiatric: She has a normal mood and affect. Her behavior is normal. Judgment and thought content normal.          Assessment & Plan:

## 2016-10-21 NOTE — Progress Notes (Signed)
Pre visit review using our clinic review tool, if applicable. No additional management support is needed unless otherwise documented below in the visit note. 

## 2016-10-21 NOTE — Patient Instructions (Signed)
Please schedule a follow up visit with Dr. Jens Som.   Change demedex to 1 tablet every other day. Continue your work with physical therapy and occupational therapy. Keep your upcoming appointment with Dr. Danielle Dess.

## 2016-10-21 NOTE — Assessment & Plan Note (Signed)
Advised pt that I would prefer that we try an alternative to a daily narcotic for her. D/c hydrocodone, trial of meloxicam. If this does not help, consider referral to pain management.

## 2016-10-21 NOTE — Assessment & Plan Note (Signed)
BP is stable on current meds.   

## 2016-10-21 NOTE — Assessment & Plan Note (Signed)
Has some LE edema. She is taking torsemide 3x a week.  I have advised her to change to every other day and arrange follow up with Dr. Jens Som- her cardiologist.

## 2016-10-22 NOTE — Telephone Encounter (Signed)
Attempted to reach pt. Mailbox is full and cannot accept messages. Sent mychart message to pt.

## 2016-10-23 NOTE — Telephone Encounter (Signed)
Called patient regarding CPAP machine. States she does not have one.

## 2016-10-23 NOTE — Telephone Encounter (Signed)
I will place referral for home sleep study.

## 2016-10-29 ENCOUNTER — Other Ambulatory Visit: Payer: Self-pay | Admitting: *Deleted

## 2016-10-29 MED ORDER — RANITIDINE HCL 150 MG PO TABS: 150.0000 mg | ORAL_TABLET | Freq: Every day | ORAL | 2 refills | Status: DC

## 2016-10-29 NOTE — Progress Notes (Signed)
Faxed refill request received from [facility] for Zantac 150 mg Refill sent per [facility] refill protocol/SLS

## 2016-10-31 ENCOUNTER — Telehealth: Payer: Self-pay | Admitting: Family

## 2016-10-31 NOTE — Telephone Encounter (Signed)
Caller name: Relationship to patient: Self Can be reached: [phone] Pharmacy:  Reason for call: Request MRI of her leg because she is having sharpe pains in her upper thigh

## 2016-10-31 NOTE — Telephone Encounter (Signed)
Notified pt and she voices understanding. Appt scheduled for Monday at 9am.

## 2016-10-31 NOTE — Telephone Encounter (Signed)
Spoke with pt. She states she is having sharp, shooting pains in her left thigh x 3-4 weeks and pain is becoming more constant. States this is the leg that is always swollen and she doesn't think swelling is any worse than usual. No recent car trips / flights, no chest pain or shortness of breath. Has been taking meloxicam and gabapentin without relief. States she took 2 gabapentin the other day and it did not help the pain. Please advise?

## 2016-10-31 NOTE — Telephone Encounter (Signed)
She will need an office visit for further evaluation and we may ultimately need to get her back in with Dr. Danielle Dess.  Pain is likely related to her lumbar disc issues which were noted on her mri and the pain is radiating from back into the thigh.  If pain is severe over the weekend she should go to the ER.

## 2016-11-03 ENCOUNTER — Telehealth: Payer: Self-pay | Admitting: Family

## 2016-11-03 ENCOUNTER — Ambulatory Visit (INDEPENDENT_AMBULATORY_CARE_PROVIDER_SITE_OTHER): Payer: BLUE CROSS/BLUE SHIELD | Admitting: Family

## 2016-11-03 ENCOUNTER — Encounter: Payer: Self-pay | Admitting: Family

## 2016-11-03 VITALS — BP 118/65 | HR 82 | Temp 98.4°F | Resp 20 | Ht 66.0 in

## 2016-11-03 DIAGNOSIS — M5116 Intervertebral disc disorders with radiculopathy, lumbar region: Secondary | ICD-10-CM | POA: Diagnosis not present

## 2016-11-03 DIAGNOSIS — G959 Disease of spinal cord, unspecified: Secondary | ICD-10-CM | POA: Diagnosis not present

## 2016-11-03 DIAGNOSIS — I509 Heart failure, unspecified: Secondary | ICD-10-CM | POA: Diagnosis not present

## 2016-11-03 DIAGNOSIS — E876 Hypokalemia: Secondary | ICD-10-CM

## 2016-11-03 DIAGNOSIS — R159 Full incontinence of feces: Secondary | ICD-10-CM

## 2016-11-03 DIAGNOSIS — M519 Unspecified thoracic, thoracolumbar and lumbosacral intervertebral disc disorder: Secondary | ICD-10-CM

## 2016-11-03 LAB — BASIC METABOLIC PANEL
BUN: 14 mg/dL (ref 6–23)
CHLORIDE: 107 meq/L (ref 96–112)
CO2: 26 meq/L (ref 19–32)
CREATININE: 1.02 mg/dL (ref 0.40–1.20)
Calcium: 9.3 mg/dL (ref 8.4–10.5)
GFR: 71.37 mL/min (ref >60.00)
Glucose, Bld: 108 mg/dL — ABNORMAL HIGH (ref 70–99)
Potassium: 3.2 mEq/L — ABNORMAL LOW (ref 3.5–5.1)
Sodium: 142 mEq/L (ref 135–145)

## 2016-11-03 MED ORDER — METHYLPREDNISOLONE 4 MG PO TBPK: ORAL_TABLET | ORAL | 0 refills | Status: DC

## 2016-11-03 NOTE — Patient Instructions (Signed)
Continue Torsemide 10mg  every other day.  We will work on getting you set up for outpatient physical therapy.  Begin medrol dose pak for your back pain.

## 2016-11-03 NOTE — Telephone Encounter (Signed)
Left message for Dr. Danielle Dess re: to discuss Ms Benge. He is in surgery and I have left my cell number for him to call me back.

## 2016-11-03 NOTE — Telephone Encounter (Signed)
Caller name: Tressy Rost Relationship to patient: self Can be reached: [phone]  Reason for call: Pt called stating she was seen today and needs a note sent to her job as to her being in a wheelchair and the reason she is out of work. Please call pt back.

## 2016-11-03 NOTE — Progress Notes (Signed)
Subjective:    Patient ID: Briana Miller, female    DOB: [birth year], 61 y.o.   MRN: [id number]  HPI  Briana Miller is a 61 yr old female with hx of cervical myelopathy s/p decompression/fusion 2/18 by Dr. Danielle Dess and hx of Lumbar disc disease noted on previous MRI of the lumbar spine who presents today with chief complaint of left anterior thigh pain. Pain radiates down the front of the leg into the left knee .  Pain has been present x 3-4 weeks.  Pain is described as sharp and shooting in nature. She denies LE numbness. She notes one episode of bowel incontinence on Saturday.  She reports that she has had no urinary incontinence.  She continues to work with home PT but feels that this is not rigorous enough and want to return to out patient PT/rehab center.    CHF- Currently taking torsemide 10 mg every other day. Notes improvement in her LE edema.   Review of Systems See HPI  Past Medical History:  Diagnosis Date  . Acute systolic congestive heart failure (HCC) 03/01/2014  . Allergy   . Anemia   . Arthritis   . Cardiomyopathy (HCC)   . CHF (congestive heart failure) (HCC)   . GERD (gastroesophageal reflux disease)   . Heart disease   . Hyperlipidemia   . Hypertension   . Leg pain   . Overactive bladder      Social History   Social History  . Marital status: Married    Spouse name: N/A  . Number of children: 2  . Years of education: HS   Occupational History  . [employer]   Social History Main Topics  . Smoking status: Never Smoker  . Smokeless tobacco: Never Used  . Alcohol use 1.2 oz/week    2 Glasses of wine per week  . Drug use: No  . Sexual activity: Not on file   Other Topics Concern  . 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   1-2 cups caffeine daily.   Lives at home with her husband.       Past Surgical History:  Procedure Laterality Date  . ANTERIOR CERVICAL DECOMP/DISCECTOMY FUSION N/A 08/03/2016   Procedure: ANTERIOR CERVICAL DECOMPRESSION/DISCECTOMY FUSION C4-5, C5-6;  Surgeon: Barnett Abu, MD;  Location: [facility] OR;  Service: Neurosurgery;  Laterality: N/A;  . COLONOSCOPY    . KNEE ARTHROSCOPY     2007  . ROTATOR CUFF REPAIR Right 03/23/13  . TOTAL KNEE ARTHROPLASTY  09/22/2011   Procedure: TOTAL KNEE ARTHROPLASTY;  Surgeon: Raymon Mutton, MD;  Location: [facility] OR;  Service: Orthopedics;  Laterality: Right;    Family History  Problem Relation Age of Onset  . Cancer Mother        colon 24 and breast 70- deceased  . Colon cancer Mother   . Diabetes Father        living  . Asthma Father   . Hypertension Father   . Heart attack Father 89       medical management per pt  . Glaucoma Father        had eye transplant  . Kidney failure  Father        acute renal failure- died 65  . Esophageal cancer Neg Hx   . Stomach cancer Neg Hx   . Rectal cancer Neg Hx     No Known Allergies  Current Outpatient Prescriptions on File Prior to Visit  Medication Sig Dispense Refill  . calcium-vitamin D (OSCAL WITH D) 500-200 MG-UNIT per tablet Take 1 tablet by mouth daily.    Marland Kitchen gabapentin (NEURONTIN) 300 MG capsule Take 2 capsules (600 mg total) by mouth 3 (three) times daily. 180 capsule 11  . losartan (COZAAR) 50 MG tablet Take 1 tablet (50 mg total) by mouth daily. 90 tablet 1  . meloxicam (MOBIC) 7.5 MG tablet Take 1 tablet (7.5 mg total) by mouth daily as needed for pain. 30 tablet 1  . metoprolol succinate (TOPROL-XL) 50 MG 24 hr tablet Take 1 tablet (50 mg total) by mouth daily. Take with or immediately following a meal. 90 tablet 1  . ranitidine (ZANTAC) 150 MG tablet Take 1 tablet (150 mg total) by mouth at bedtime. 30 tablet 2  . torsemide (DEMADEX) 10 MG  tablet Take 1 tablet (10 mg total) by mouth every other day. <PLEASE MAKE APPOINTMENT FOR REFILLS> (Patient taking differently: Take 20 mg by mouth every other day. <PLEASE MAKE APPOINTMENT FOR REFILLS>) 45 tablet 1   No current facility-administered medications on file prior to visit.     BP 118/65 (BP Location: Right Arm, Cuff Size: Large)   Pulse 82   Temp 98.4 F (36.9 C) (Oral)   Resp 20   Ht 5\' 6"  (1.676 m)   LMP 06/17/2007   SpO2 100%       Objective:   Physical Exam  Constitutional: She is oriented to person, place, and time. She appears well-developed and well-nourished.  HENT:  Head: Normocephalic and atraumatic.  Cardiovascular: Normal rate, regular rhythm and normal heart sounds.   No murmur heard. Pulmonary/Chest: Effort normal and breath sounds normal. No respiratory distress. She has no wheezes.  Abdominal: Soft. Bowel sounds are normal.  Musculoskeletal:  2+ bilateral LE edema   Neurological: She is alert and oriented to person, place, and time.  Bilateral UE/LE strength is 4-5/5  Psychiatric: She has a normal mood and affect. Her behavior is normal. Judgment and thought content normal.          Assessment & Plan:  Hx of cervical myelopathy/Lumbar disc disease with radiculopathy- radicular pain in L4 distribution. Will rx with medrol dose pak. She is followed by Dr. Danielle Dess. I am concerned about her one episode of stool incontinence. She was having this prior to her cervical surgery. She is not having urinary incontinence which is reassuring. I have placed a call to Dr. Danielle Dess to further discuss. Will also refer to oupatient PT at her request.  CHF- LE edema is improved. Continue toresmide QOD. Obtain follow up bmet. -  Will arrange follow up with Dr. Jens Som.

## 2016-11-04 ENCOUNTER — Telehealth: Payer: Self-pay | Admitting: Family

## 2016-11-04 NOTE — Telephone Encounter (Signed)
Pt called back in stating she only needs the note to be for 90 days after surgery.

## 2016-11-04 NOTE — Telephone Encounter (Signed)
Pt called she does not want to go to the physical therapy dept on referral. Pt request to go to the "one on [address] in [place]."

## 2016-11-04 NOTE — Telephone Encounter (Signed)
Spoke with pt. She is requesting letter from PCP stating she is still unable to return to work and the reason why. Pt reports continued mobility issues and still uses wheelchair most of the time.  Pt states she was told by her surgeon that she may not regain feeling in her hands for up to 6 months after the surgery. Pt states 6 months would be August. She is requesting letter to be faxed to Attn: Valentina Gu, [phone].  Please advise?

## 2016-11-04 NOTE — Telephone Encounter (Signed)
I called pt and she wants to go to [facility], she went there before  [facility] [address] [place], [place] [address] P: [phone] F: [phone]

## 2016-11-06 MED ORDER — POTASSIUM CHLORIDE CRYS ER 20 MEQ PO TBCR: 20.0000 meq | EXTENDED_RELEASE_TABLET | Freq: Every day | ORAL | 3 refills | Status: DC

## 2016-11-06 NOTE — Telephone Encounter (Signed)
Also, potassium is low.  Add Kdur - two tabs by mouth today, then one tablet once daily beginning tomorrow. Repeat bmet in 1 week.

## 2016-11-06 NOTE — Telephone Encounter (Signed)
90 days after surgery is 5/18.  Since she is still not able to return to work, I would recommend that we write her out for 6 month.  Please review this with the patient and then I will write letter.  Also, due to her recent episode of fecal incontinence, I think we should repeat her MRI.  I have placed the orders.

## 2016-11-07 ENCOUNTER — Encounter: Payer: Self-pay | Admitting: Family

## 2016-11-07 NOTE — Telephone Encounter (Signed)
Stat orders have been placed.

## 2016-11-07 NOTE — Telephone Encounter (Signed)
Please also advise patient that I would recommend that she do the MRI ASAP in case there is a spinal cord compression which is contributing to her recent episode of fecal incontinence.

## 2016-11-07 NOTE — Telephone Encounter (Signed)
Notified pt of letter and form completion.

## 2016-11-07 NOTE — Telephone Encounter (Signed)
Letter has been written. 

## 2016-11-07 NOTE — Telephone Encounter (Addendum)
Notified pt of below and she voices understanding. Lab appt scheduled for 11/18/16 at 11am, future order entered. Pt is agreeable to proceed with MRI but needs at [facility] for insurance purposes. Pt is in agreement regarding not taking her out of work 6 months after surgery. Please advise?  Candise Bowens-- please notify pt of MRI date / time and location once that is available. Thank you!

## 2016-11-07 NOTE — Telephone Encounter (Signed)
Patient is going to have done at [facility], she wants to wait till Tuesday. Also patient is requesting to add MRI of right leg due to pain, please advise.

## 2016-11-07 NOTE — Telephone Encounter (Signed)
Right leg pain is referred from her spine I believe so, I would like to start with spinal imaging first and the we can see if additional imaging is necessary.

## 2016-11-07 NOTE — Telephone Encounter (Signed)
Left message for pt to return my call.

## 2016-11-07 NOTE — Telephone Encounter (Signed)
-----   Message from [name] sent at 11/07/2016  8:21 AM EDT ----- Unable to find anyone today or tomorrow, if you put it in STAT, I can try to see if [facility] or [facility] will do it. Thanks ----- Message ----- From: Sandford Craze, NP Sent: 11/07/2016   7:54 AM To: [name]  Can we please try to get her MRI scheduled for today or tomorrow?

## 2016-11-07 NOTE — Addendum Note (Signed)
Addended by: Mervin Kung A on: 11/07/2016 12:03 PM   Modules accepted: Orders

## 2016-11-07 NOTE — Telephone Encounter (Signed)
Letter faxed to Lucy at below #. Form completed and faxed to [employer] at [phone] re: transportation accomodations.

## 2016-11-13 ENCOUNTER — Telehealth: Payer: Self-pay | Admitting: Family

## 2016-11-13 ENCOUNTER — Other Ambulatory Visit: Payer: Self-pay | Admitting: *Deleted

## 2016-11-13 MED ORDER — GABAPENTIN 300 MG PO CAPS: 600.0000 mg | ORAL_CAPSULE | Freq: Three times a day (TID) | ORAL | 5 refills | Status: DC

## 2016-11-13 NOTE — Telephone Encounter (Signed)
Caller name: Circe Iwai Relationship to patient: self Can be reached: [phone]  Reason for call: Had MRI at [facility] 11/11/16 and has disc. Pt asking if she needs needs to bring in disc or if we have received/can pull results of MRI.

## 2016-11-13 NOTE — Progress Notes (Signed)
Faxed refill request [facility] from [facility] for New Rx for Gabapentin 300 mg caps Last filled by MD on 04/18/16 D/C PREVIOUS SCRIPTS FOR THIS MEDICATION Refill sent per [facility] refill protocol/SLS

## 2016-11-14 NOTE — Telephone Encounter (Signed)
Please let pt know that I reviewed her MRI. It shows arthritis and disc disease as well as some damage of her spinal cord near where her surgery was but it looks like there is no longer any pressure on the spinal cord. Overall I don't see any major new concerns, but I have left a message for Dr. Danielle Dess her neurosurgeon to review the MRI and call me back. He is out of the office today so I don't expect to hear from him until Monday.

## 2016-11-14 NOTE — Telephone Encounter (Signed)
Notified pt of below. She wants to know what could be causing the shooting pains in her legs. States pain is worsening and now having pain in her tailbone as well. Please advise?

## 2016-11-14 NOTE — Telephone Encounter (Signed)
Reports printed from [facility] and placed in PCP yellow folder for review. Please advise?

## 2016-11-15 NOTE — Telephone Encounter (Signed)
I think pain is due to her discs in her lower back, I would recommend that she schedule follow with Dr Danielle Dess.

## 2016-11-17 NOTE — Telephone Encounter (Signed)
Notified pt and she voices understanding. She has been advised to schedule appt with Dr Danielle Dess to discuss pain.

## 2016-11-18 ENCOUNTER — Telehealth: Payer: Self-pay | Admitting: *Deleted

## 2016-11-18 ENCOUNTER — Other Ambulatory Visit: Payer: BLUE CROSS/BLUE SHIELD

## 2016-11-18 NOTE — Telephone Encounter (Signed)
Received application for transportation services application from [employer] stating that "clarification is needed on some of the answers" and that "application is considered incomplete until the clarification is provided", forwarded to provider/SLS 06/05

## 2016-11-19 ENCOUNTER — Telehealth: Payer: Self-pay | Admitting: Family

## 2016-11-19 NOTE — Telephone Encounter (Signed)
[name]-- if this is ok I will print PT referral in EPIC from 11/03/16 and fax to below facility. Please advise?

## 2016-11-19 NOTE — Telephone Encounter (Signed)
Ok. Thanks!

## 2016-11-19 NOTE — Telephone Encounter (Signed)
Referral faxed to below #

## 2016-11-19 NOTE — Telephone Encounter (Signed)
Caller name:[facility] Relationship to patient: Can be reached:[phone] fax [phone] Pharmacy:  Reason for call:Requesting OT consult for patient, will need written order

## 2016-11-20 ENCOUNTER — Telehealth: Payer: Self-pay | Admitting: *Deleted

## 2016-11-20 NOTE — Telephone Encounter (Signed)
Received request for Medical records from [facility] Nurse] requesting patient's notes from April-May, including labs & medication list to assist in the coordination of patient's healthcare services; patient synopsis report attached; forwarded to provider with requested material attached for approval/SLS 06/07

## 2016-11-21 NOTE — Telephone Encounter (Signed)
Questions answered and form faxed back to [facility] at [phone].

## 2016-11-24 ENCOUNTER — Other Ambulatory Visit: Payer: Self-pay | Admitting: *Deleted

## 2016-11-24 MED ORDER — RANITIDINE HCL 150 MG PO TABS: 150.0000 mg | ORAL_TABLET | Freq: Every day | ORAL | 0 refills | Status: DC

## 2016-11-24 NOTE — Progress Notes (Signed)
[facility]ed [facility] Ran[facility]idine 150mg ; 90-day supply. Refill sent per [facility] refill protocol/SLS 06/11

## 2016-11-28 ENCOUNTER — Other Ambulatory Visit: Payer: Self-pay | Admitting: Family

## 2016-12-01 ENCOUNTER — Telehealth: Payer: Self-pay | Admitting: *Deleted

## 2016-12-01 ENCOUNTER — Encounter: Payer: Self-pay | Admitting: Family

## 2016-12-01 NOTE — Telephone Encounter (Signed)
Letter done today by PCP for Dr Danielle Dess and MRI of cervical and lumbar spine done at [facility] faxed to Dr Danielle Dess at [phone].

## 2016-12-04 ENCOUNTER — Telehealth: Payer: Self-pay | Admitting: Family

## 2016-12-04 NOTE — Telephone Encounter (Signed)
Received call back from Dr. Danielle Dess. We discussed her most recent MRI findings.  He reports that fecal incontinence is consistent with her cervical myelopathy.  Feels back pain is worsened by her epidural lipomatosis and that weight loss would be most helpful for her.

## 2016-12-18 ENCOUNTER — Telehealth: Payer: Self-pay | Admitting: Family

## 2016-12-18 DIAGNOSIS — G894 Chronic pain syndrome: Secondary | ICD-10-CM

## 2016-12-18 NOTE — Telephone Encounter (Signed)
Notified pt and she voices understanding. Pt also states that her neurologist also recommended that she see bariatric specialists and pt is asking [place] to place referral?  Please advise?

## 2016-12-18 NOTE — Telephone Encounter (Signed)
Opened in error

## 2016-12-18 NOTE — Telephone Encounter (Signed)
I have placed order but let pt know that process is lengthy for pain management.

## 2016-12-18 NOTE — Telephone Encounter (Signed)
Referral has been placed. 

## 2016-12-18 NOTE — Telephone Encounter (Addendum)
Relation to pt: self  Call back number:[phone]   Reason for call:  Patient states  Dr. Danielle Dess recommended pain management due to MRI findings mentioned below, patient checking on the status of referral, please advise

## 2017-01-07 ENCOUNTER — Telehealth: Payer: Self-pay | Admitting: Family

## 2017-01-07 NOTE — Telephone Encounter (Signed)
There was an order placed on 10/21/2016 for the patient to do a home sleep study. I have left messages for the patient to call and schedule on 11/05/2016, 11/19/2016. When I finally spoke with the patient she stated to call her back in 2 weeks from 11/28/2016. I then called and left messages again on 12/12/2016, 12/31/2016, and 01/07/2017.

## 2017-01-07 NOTE — Telephone Encounter (Signed)
Noted  

## 2017-01-16 NOTE — Progress Notes (Deleted)
HPI: FU cardiomyopathy. Nuclear study in December of 2014 showed an ejection fraction of 51% and normal perfusion. Last echocardiogram February 2018 showed mild global reduction in LV function, grade 1 diastolic dysfunction, mild mitral and tricuspid regurgitation. Since last seen   Current Outpatient Prescriptions  Medication Sig Dispense Refill  . calcium-vitamin D (OSCAL WITH D) 500-200 MG-UNIT per tablet Take 1 tablet by mouth daily.    Marland Kitchen gabapentin (NEURONTIN) 300 MG capsule Take 2 capsules (600 mg total) by mouth 3 (three) times daily. 180 capsule 5  . losartan (COZAAR) 50 MG tablet Take 1 tablet (50 mg total) by mouth daily. 90 tablet 1  . meloxicam (MOBIC) 7.5 MG tablet TAKE 1 TABLET (7.5 MG TOTAL) BY MOUTH DAILY AS NEEDED FOR PAIN. 30 tablet 1  . methylPREDNISolone (MEDROL DOSEPAK) 4 MG TBPK tablet Take as directed. 21 tablet 0  . metoprolol succinate (TOPROL-XL) 50 MG 24 hr tablet Take 1 tablet (50 mg total) by mouth daily. Take with or immediately following a meal. 90 tablet 1  . potassium chloride SA (K-DUR,KLOR-CON) 20 MEQ tablet Take 1 tablet (20 mEq total) by mouth daily. 30 tablet 3  . ranitidine (ZANTAC) 150 MG tablet Take 1 tablet (150 mg total) by mouth at bedtime. 90 tablet 0  . torsemide (DEMADEX) 10 MG tablet Take 1 tablet (10 mg total) by mouth every other day. <PLEASE MAKE APPOINTMENT FOR REFILLS> (Patient taking differently: Take 20 mg by mouth every other day. <PLEASE MAKE APPOINTMENT FOR REFILLS>) 45 tablet 1   No current facility-administered medications for this visit.      Past Medical History:  Diagnosis Date  . Acute systolic congestive heart failure (HCC) 03/01/2014  . Allergy   . Anemia   . Arthritis   . Cardiomyopathy (HCC)   . CHF (congestive heart failure) (HCC)   . GERD (gastroesophageal reflux disease)   . Heart disease   . Hyperlipidemia   . Hypertension   . Leg pain   . Overactive bladder     Past Surgical History:  Procedure  Laterality Date  . ANTERIOR CERVICAL DECOMP/DISCECTOMY FUSION N/A 08/03/2016   Procedure: ANTERIOR CERVICAL DECOMPRESSION/DISCECTOMY FUSION C4-5, C5-6;  Surgeon: Barnett Abu, MD;  Location: [facility] OR;  Service: Neurosurgery;  Laterality: N/A;  . COLONOSCOPY    . KNEE ARTHROSCOPY     2007  . ROTATOR CUFF REPAIR Right 03/23/13  . TOTAL KNEE ARTHROPLASTY  09/22/2011   Procedure: TOTAL KNEE ARTHROPLASTY;  Surgeon: Raymon Mutton, MD;  Location: [facility] OR;  Service: Orthopedics;  Laterality: Right;    Social History   Social History  . Marital status: Married    Spouse name: N/A  . Number of children: 2  . Years of education: HS   Occupational History  . [employer]   Social History Main Topics  . Smoking status: Never Smoker  . Smokeless tobacco: Never Used  . Alcohol use 1.2 oz/week    2 Glasses of wine per week  . Drug use: No  . Sexual activity: Not on file   Other Topics Concern  . 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  1-2 cups caffeine daily.   Lives at home with her husband.       Family History  Problem Relation Age of Onset  . Cancer Mother        colon 55 and breast 18- deceased  . Colon cancer Mother   . Diabetes Father        living  . Asthma Father   . Hypertension Father   . Heart attack Father 63       medical management per pt  . Glaucoma Father        had eye transplant  . Kidney failure Father        acute renal failure- died 56  . Esophageal cancer Neg Hx   . Stomach cancer Neg Hx   . Rectal cancer Neg Hx     ROS: no fevers or chills, productive cough, hemoptysis, dysphasia, odynophagia, melena, hematochezia, dysuria, hematuria, rash, seizure activity, orthopnea, PND,  pedal edema, claudication. Remaining systems are negative.  Physical Exam: Well-developed well-nourished in no acute distress.  Skin is warm and dry.  HEENT is normal.  Neck is supple.  Chest is clear to auscultation with normal expansion.  Cardiovascular exam is regular rate and rhythm.  Abdominal exam nontender or distended. No masses palpated. Extremities show no edema. neuro grossly intact  ECG- personally reviewed  A/P  1  Briana Millers, MD

## 2017-01-21 ENCOUNTER — Ambulatory Visit: Payer: BLUE CROSS/BLUE SHIELD | Admitting: Family

## 2017-01-21 DIAGNOSIS — Z0289 Encounter for other administrative examinations: Secondary | ICD-10-CM

## 2017-01-27 ENCOUNTER — Ambulatory Visit: Payer: BLUE CROSS/BLUE SHIELD | Admitting: Cardiology

## 2017-01-28 ENCOUNTER — Encounter: Payer: Self-pay | Admitting: Family

## 2017-01-28 ENCOUNTER — Ambulatory Visit (INDEPENDENT_AMBULATORY_CARE_PROVIDER_SITE_OTHER): Payer: BLUE CROSS/BLUE SHIELD | Admitting: Family

## 2017-01-28 VITALS — BP 132/82 | HR 75 | Temp 97.8°F | Resp 18 | Ht 66.0 in

## 2017-01-28 DIAGNOSIS — R739 Hyperglycemia, unspecified: Secondary | ICD-10-CM | POA: Diagnosis not present

## 2017-01-28 DIAGNOSIS — E876 Hypokalemia: Secondary | ICD-10-CM

## 2017-01-28 DIAGNOSIS — L304 Erythema intertrigo: Secondary | ICD-10-CM

## 2017-01-28 DIAGNOSIS — I10 Essential (primary) hypertension: Secondary | ICD-10-CM | POA: Diagnosis not present

## 2017-01-28 DIAGNOSIS — G959 Disease of spinal cord, unspecified: Secondary | ICD-10-CM | POA: Diagnosis not present

## 2017-01-28 LAB — BASIC METABOLIC PANEL
BUN: 13 mg/dL (ref 6–23)
CALCIUM: 9.7 mg/dL (ref 8.4–10.5)
CHLORIDE: 105 meq/L (ref 96–112)
CO2: 32 mEq/L (ref 19–32)
Creatinine, Ser: 0.81 mg/dL (ref 0.40–1.20)
GFR: 93.05 mL/min (ref >60.00)
Glucose, Bld: 102 mg/dL — ABNORMAL HIGH (ref 70–99)
POTASSIUM: 4 meq/L (ref 3.5–5.1)
Sodium: 143 mEq/L (ref 135–145)

## 2017-01-28 MED ORDER — CEPHALEXIN 500 MG PO CAPS: 500.0000 mg | ORAL_CAPSULE | Freq: Three times a day (TID) | ORAL | 0 refills | Status: DC

## 2017-01-28 MED ORDER — FLUCONAZOLE 150 MG PO TABS: ORAL_TABLET | ORAL | 0 refills | Status: DC

## 2017-01-28 MED ORDER — NYSTATIN 100000 UNIT/GM EX POWD: CUTANEOUS | 2 refills | Status: DC

## 2017-01-28 NOTE — Patient Instructions (Addendum)
Complete lab work prior to leaving.  Start keflex (antibiotic) for bacterial skin infection beneath breasts. Start diflucan to help with fungal infection beneath breasts Apply nystatin powder beneath both breasts twice daily.  Continue your physical therapy.

## 2017-01-28 NOTE — Progress Notes (Signed)
Subjective:    Patient ID: Briana Miller, female    DOB: 1955-07-10, 61 y.o.   MRN: [id number]  HPI  Briana Miller is a 61 yr old female who presents today for follow up.  1) HTN- reports that she is taking losartan.   BP Readings from Last 3 Encounters:  01/28/17 132/82  11/03/16 118/65  10/21/16 117/86   2) Hyperglycemia-  Lab Results  Component Value Date   HGBA1C 5.9 (H) 08/02/2016   3) breast problem- reports itching/odor beneath breasts x 2 weeks.   4) Cervical Myelopathy-  Continues to work with physical therapy.  Needs to continue PT.  Notes that she needs a note for work. Needs letter for her employer that she needs 3 more months.  She still  has numbness in her upper extremities has decreased dexterity in her bilateral upper extremities. She is unable to fully extend her left leg. She reports that she can stand slightly with some assistance. She remains nonambulatory. She continues physical therapy.   She is trying to start Coolief for her knee pain. She reports that the MD increased to 4 caps tid.    Review of Systems    see HPI  Past Medical History:  Diagnosis Date  . Acute systolic congestive heart failure (HCC) 03/01/2014  . Allergy   . Anemia   . Arthritis   . Cardiomyopathy (HCC)   . CHF (congestive heart failure) (HCC)   . GERD (gastroesophageal reflux disease)   . Heart disease   . Hyperlipidemia   . Hypertension   . Leg pain   . Overactive bladder      Social History   Social History  . Marital status: Married    Spouse name: N/A  . Number of children: 2  . Years of education: HS   Occupational History  . [employer]   Social History Main Topics  . Smoking status: Never Smoker  . Smokeless tobacco: Never Used  . Alcohol use 1.2 oz/week    2 Glasses of wine per week  . Drug use: No  . Sexual activity: Not on file   Other Topics Concern  . 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   1-2 cups caffeine daily.   Lives at home with her husband.       Past Surgical History:  Procedure Laterality Date  . ANTERIOR CERVICAL DECOMP/DISCECTOMY FUSION N/A 08/03/2016   Procedure: ANTERIOR CERVICAL DECOMPRESSION/DISCECTOMY FUSION C4-5, C5-6;  Surgeon: Barnett Abu, MD;  Location: [facility] OR;  Service: Neurosurgery;  Laterality: N/A;  . COLONOSCOPY    . KNEE ARTHROSCOPY     2007  . ROTATOR CUFF REPAIR Right 03/23/13  . TOTAL KNEE ARTHROPLASTY  09/22/2011   Procedure: TOTAL KNEE ARTHROPLASTY;  Surgeon: Raymon Mutton, MD;  Location: [facility] OR;  Service: Orthopedics;  Laterality: Right;    Family History  Problem Relation Age of Onset  . Cancer Mother        colon 76 and breast 69- deceased  . Colon cancer Mother   . Diabetes Father        living  . Asthma Father   . Hypertension Father   .  Heart attack Father 32       medical management per pt  . Glaucoma Father        had eye transplant  . Kidney failure Father        acute renal failure- died 68  . Esophageal cancer Neg Hx   . Stomach cancer Neg Hx   . Rectal cancer Neg Hx     No Known Allergies  Current Outpatient Prescriptions on File Prior to Visit  Medication Sig Dispense Refill  . calcium-vitamin D (OSCAL WITH D) 500-200 MG-UNIT per tablet Take 1 tablet by mouth daily.    Marland Kitchen gabapentin (NEURONTIN) 300 MG capsule Take 2 capsules (600 mg total) by mouth 3 (three) times daily. (Patient taking differently: Take 1,200 mg by mouth 3 (three) times daily. ) 180 capsule 5  . losartan (COZAAR) 50 MG tablet Take 1 tablet (50 mg total) by mouth daily. 90 tablet 1  . metoprolol succinate (TOPROL-XL) 50 MG 24 hr tablet Take 1 tablet (50 mg total) by mouth daily. Take with or immediately following a  meal. 90 tablet 1  . potassium chloride SA (K-DUR,KLOR-CON) 20 MEQ tablet Take 1 tablet (20 mEq total) by mouth daily. 30 tablet 3  . ranitidine (ZANTAC) 150 MG tablet Take 1 tablet (150 mg total) by mouth at bedtime. 90 tablet 0  . torsemide (DEMADEX) 10 MG tablet Take 1 tablet (10 mg total) by mouth every other day. <PLEASE MAKE APPOINTMENT FOR REFILLS> (Patient taking differently: Take 20 mg by mouth every other day. <PLEASE MAKE APPOINTMENT FOR REFILLS>) 45 tablet 1   No current facility-administered medications on file prior to visit.     BP 132/82   Pulse 75   Temp 97.8 F (36.6 C) (Oral)   Resp 18   Ht 5\' 6"  (1.676 m)   LMP 06/17/2007   SpO2 100%    Objective:   Physical Exam  Constitutional: She is oriented to person, place, and time. She appears well-developed and well-nourished.  HENT:  Head: Normocephalic and atraumatic.  Cardiovascular: Normal rate, regular rhythm and normal heart sounds.   No murmur heard. Pulmonary/Chest: Effort normal and breath sounds normal. No respiratory distress. She has no wheezes.  Musculoskeletal: She exhibits no edema.  Lymphadenopathy:    She has no cervical adenopathy.  Neurological: She is alert and oriented to person, place, and time.  Bilateral upper extremity strength is 4-5 out of 5 Right lower extremity strength is 4-5 out of 5 Left lower extremity strength is 4 out of 5  Skin:  Rash is noted beneath both breasts, mild odor.   Psychiatric: She has a normal mood and affect. Her behavior is normal. Judgment and thought content normal.          Assessment & Plan:  Intertrigo- beneath breasts, suspect superimposed bacterial infection. Advised pt as follows:   Start keflex (antibiotic) for bacterial skin infection beneath breasts. Start diflucan to help with fungal infection beneath breasts Apply nystatin powder beneath both breasts twice daily.   Cervical myelopathy- continues to have UE/LE weakness and UE numbness.  I doubt  that she will regain her full dexterity or walk again.  I have advised her to remain out of work and to continue PT. Letters provided for PT and her employer.  Hyperglycemia- last a1c was mildly elevated. Monitor.   HTN- bp stable on current meds, continue same   Hypokalemia- obtain follow up bmet.

## 2017-01-29 ENCOUNTER — Encounter: Payer: Self-pay | Admitting: Family

## 2017-02-13 ENCOUNTER — Telehealth: Payer: Self-pay | Admitting: Family

## 2017-02-13 NOTE — Telephone Encounter (Signed)
Spoke with pt. She states hair loss started about 3 months ago and is worsening. "Hair comes out in lumps just running her hand through her hair". States she has intermittent diarrhea x 2 months that seems to be related to "taking her gabapentin". Also says she has started having "restless leg" symptoms. Advised pt she would need office evaluation and scheduled appt with PCP on Wednesday at 10:30am.

## 2017-02-13 NOTE — Telephone Encounter (Signed)
Noted and agree. 

## 2017-02-13 NOTE — Telephone Encounter (Signed)
Pt called in to make  PCP aware that gabapentin is causing hair loss and diarrhea.    She would like to be advised.

## 2017-02-14 ENCOUNTER — Other Ambulatory Visit: Payer: Self-pay | Admitting: Family

## 2017-02-18 ENCOUNTER — Ambulatory Visit: Payer: BLUE CROSS/BLUE SHIELD | Admitting: Family

## 2017-02-22 ENCOUNTER — Other Ambulatory Visit: Payer: Self-pay | Admitting: Family

## 2017-02-27 NOTE — Progress Notes (Signed)
HPI: FU cardiomyopathy. Echocardiogram in October of 2014 showed an ejection fraction of 40-45%, mild left ventricular hypertrophy and mild left atrial enlargement. Nuclear study in December of 2014 showed an ejection fraction of 51% and normal perfusion. Last echo 2/18 showed EF 45-50, grade 1 DD, mild MR and TR. Since last seen patient denies increased dyspnea, exertional chest pain or syncope. She has noticed increased bilateral pedal edema. No syncope.  Current Outpatient Prescriptions  Medication Sig Dispense Refill  . calcium-vitamin D (OSCAL WITH D) 500-200 MG-UNIT per tablet Take 1 tablet by mouth daily.    . celecoxib (CELEBREX) 100 MG capsule Take 1 capsule by mouth daily.    . cephALEXin (KEFLEX) 500 MG capsule Take 1 capsule (500 mg total) by mouth 3 (three) times daily. 21 capsule 0  . fluconazole (DIFLUCAN) 150 MG tablet One tablet by mouth today. Repeat  In 1 week. 2 tablet 0  . gabapentin (NEURONTIN) 300 MG capsule Take 2 capsules (600 mg total) by mouth 3 (three) times daily. (Patient taking differently: Take 1,200 mg by mouth 3 (three) times daily. ) 180 capsule 5  . losartan (COZAAR) 50 MG tablet Take 1 tablet (50 mg total) by mouth daily. 90 tablet 1  . metoprolol succinate (TOPROL-XL) 50 MG 24 hr tablet Take 1 tablet (50 mg total) by mouth daily. Take with or immediately following a meal. 90 tablet 1  . nystatin (MYCOSTATIN/NYSTOP) powder Apply bid beneath breasts 30 g 2  . potassium chloride SA (K-DUR,KLOR-CON) 20 MEQ tablet Take 1 tablet (20 mEq total) by mouth daily. 30 tablet 3  . ranitidine (ZANTAC) 150 MG tablet TAKE 1 TABLET (150 MG TOTAL) BY MOUTH AT BEDTIME. 90 tablet 1  . torsemide (DEMADEX) 10 MG tablet Take 1 tablet (10 mg total) by mouth every other day. <PLEASE MAKE APPOINTMENT FOR REFILLS> (Patient taking differently: Take 20 mg by mouth every other day. <PLEASE MAKE APPOINTMENT FOR REFILLS>) 45 tablet 1   No current facility-administered medications for  this visit.      Past Medical History:  Diagnosis Date  . Acute systolic congestive heart failure (HCC) 03/01/2014  . Allergy   . Anemia   . Arthritis   . Cardiomyopathy (HCC)   . CHF (congestive heart failure) (HCC)   . GERD (gastroesophageal reflux disease)   . Heart disease   . Hyperlipidemia   . Hypertension   . Leg pain   . Overactive bladder     Past Surgical History:  Procedure Laterality Date  . ANTERIOR CERVICAL DECOMP/DISCECTOMY FUSION N/A 08/03/2016   Procedure: ANTERIOR CERVICAL DECOMPRESSION/DISCECTOMY FUSION C4-5, C5-6;  Surgeon: Barnett Abu, MD;  Location: [facility] OR;  Service: Neurosurgery;  Laterality: N/A;  . COLONOSCOPY    . KNEE ARTHROSCOPY     2007  . ROTATOR CUFF REPAIR Right 03/23/13  . TOTAL KNEE ARTHROPLASTY  09/22/2011   Procedure: TOTAL KNEE ARTHROPLASTY;  Surgeon: Raymon Mutton, MD;  Location: [facility] OR;  Service: Orthopedics;  Laterality: Right;    Social History   Social History  . Marital status: Married    Spouse name: N/A  . Number of children: 2  . Years of education: HS   Occupational History  . [employer]   Social History Main Topics  . Smoking status: Never Smoker  . Smokeless tobacco: Never Used  . Alcohol use 1.2 oz/week    2 Glasses of wine per week  . Drug use: No  . Sexual activity: Not  on file   Other Topics Concern  . 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   1-2 cups caffeine daily.   Lives at home with her husband.       Family History  Problem Relation Age of Onset  . Cancer Mother        colon 64 and breast 38- deceased  . Colon cancer Mother   . Diabetes Father        living  . Asthma Father   . Hypertension Father   . Heart  attack Father 41       medical management per pt  . Glaucoma Father        had eye transplant  . Kidney failure Father        acute renal failure- died 74  . Esophageal cancer Neg Hx   . Stomach cancer Neg Hx   . Rectal cancer Neg Hx     ROS: no fevers or chills, productive cough, hemoptysis, dysphasia, odynophagia, melena, hematochezia, dysuria, hematuria, rash, seizure activity, orthopnea, PND, claudication. Remaining systems are negative.  Physical Exam: Well-developed obese in no acute distress.  Skin is warm and dry.  HEENT is normal.  Neck is supple.  Chest is clear to auscultation with normal expansion.  Cardiovascular exam is regular rate and rhythm.  Abdominal exam nontender or distended. No masses palpated. Extremities show 1+ edema. neuro grossly intact   A/P  1 cardiomyopathy-mildly reduced LV function on most recent echocardiogram. Continue ARB and beta blocker.  2 Acute on chronic combined systolic/diastolic congestive heart failure-she is mildly volume overloaded on examination. Change Demadex to 10 mg daily alternating with 20 mg. She will take potassium 20 mg daily as well. Check potassium and renal function in 1 week. We discussed the importance of fluid restriction and low sodium diet.  3 hypertension-blood pressure is controlled. Continue present medications.  4 obesity-patient apparently snores. We will arrange a pulmonary evaluation to exclude sleep apnea. Discussed weight loss.  Olga Millers, MD

## 2017-03-02 ENCOUNTER — Ambulatory Visit (INDEPENDENT_AMBULATORY_CARE_PROVIDER_SITE_OTHER): Payer: BLUE CROSS/BLUE SHIELD | Admitting: Cardiology

## 2017-03-02 ENCOUNTER — Encounter: Payer: Self-pay | Admitting: Cardiology

## 2017-03-02 VITALS — BP 112/74 | HR 80 | Ht 66.0 in

## 2017-03-02 DIAGNOSIS — R0683 Snoring: Secondary | ICD-10-CM | POA: Diagnosis not present

## 2017-03-02 DIAGNOSIS — R601 Generalized edema: Secondary | ICD-10-CM

## 2017-03-02 DIAGNOSIS — I5043 Acute on chronic combined systolic (congestive) and diastolic (congestive) heart failure: Secondary | ICD-10-CM | POA: Diagnosis not present

## 2017-03-02 DIAGNOSIS — I420 Dilated cardiomyopathy: Secondary | ICD-10-CM

## 2017-03-02 MED ORDER — POTASSIUM CHLORIDE CRYS ER 20 MEQ PO TBCR: 20.0000 meq | EXTENDED_RELEASE_TABLET | Freq: Every day | ORAL | 3 refills | Status: DC

## 2017-03-02 MED ORDER — TORSEMIDE 10 MG PO TABS: ORAL_TABLET | ORAL | 3 refills | Status: DC

## 2017-03-02 NOTE — Patient Instructions (Signed)
Medication Instructions:   CHANGE TORSEMIDE TO 20 MG ALTERNATING WITH 10 MG EVERY OTHER DAY  INCREASE POTASSIUM TO 20 MG EVERY DAY  Labwork:  Your physician recommends that you return for lab work in: ONE WEEK  Follow-Up:  Your physician recommends that you schedule a follow-up appointment in: 3 MONTHS WITH DR CRENSHAW   REFERRAL TO PULMONARY FOR ? SLEEP APNEA AND SNORING

## 2017-03-17 ENCOUNTER — Other Ambulatory Visit: Payer: Self-pay | Admitting: Family

## 2017-03-17 ENCOUNTER — Ambulatory Visit: Payer: BLUE CROSS/BLUE SHIELD | Admitting: Pulmonary Disease

## 2017-03-18 NOTE — Telephone Encounter (Signed)
Pt must call and sched appt for this req/notified pharm to have pt call office/thx dmf

## 2017-04-01 ENCOUNTER — Other Ambulatory Visit: Payer: Self-pay | Admitting: Family

## 2017-04-06 ENCOUNTER — Encounter: Payer: Self-pay | Admitting: *Deleted

## 2017-04-07 ENCOUNTER — Ambulatory Visit: Payer: BLUE CROSS/BLUE SHIELD | Admitting: Cardiology

## 2017-04-25 ENCOUNTER — Other Ambulatory Visit: Payer: Self-pay | Admitting: Family

## 2017-04-27 ENCOUNTER — Ambulatory Visit: Payer: BLUE CROSS/BLUE SHIELD | Admitting: Family

## 2017-04-27 ENCOUNTER — Encounter: Payer: Self-pay | Admitting: Family

## 2017-04-27 VITALS — BP 138/69 | HR 74 | Temp 98.0°F | Resp 18 | Ht 66.0 in

## 2017-04-27 DIAGNOSIS — I10 Essential (primary) hypertension: Secondary | ICD-10-CM | POA: Diagnosis not present

## 2017-04-27 DIAGNOSIS — Z23 Encounter for immunization: Secondary | ICD-10-CM

## 2017-04-27 DIAGNOSIS — R739 Hyperglycemia, unspecified: Secondary | ICD-10-CM

## 2017-04-27 DIAGNOSIS — R7303 Prediabetes: Secondary | ICD-10-CM | POA: Diagnosis not present

## 2017-04-27 DIAGNOSIS — I509 Heart failure, unspecified: Secondary | ICD-10-CM | POA: Diagnosis not present

## 2017-04-27 DIAGNOSIS — Z0000 Encounter for general adult medical examination without abnormal findings: Secondary | ICD-10-CM

## 2017-04-27 DIAGNOSIS — G959 Disease of spinal cord, unspecified: Secondary | ICD-10-CM | POA: Diagnosis not present

## 2017-04-27 LAB — BASIC METABOLIC PANEL
BUN: 14 mg/dL (ref 6–23)
CHLORIDE: 105 meq/L (ref 96–112)
CO2: 28 meq/L (ref 19–32)
Calcium: 10 mg/dL (ref 8.4–10.5)
Creatinine, Ser: 0.8 mg/dL (ref 0.40–1.20)
GFR: 94.31 mL/min (ref >60.00)
GLUCOSE: 124 mg/dL — AB (ref 70–99)
POTASSIUM: 3.8 meq/L (ref 3.5–5.1)
SODIUM: 140 meq/L (ref 135–145)

## 2017-04-27 LAB — HEMOGLOBIN A1C: Hgb A1c MFr Bld: 6.1 % (ref 4.6–6.5)

## 2017-04-27 MED ORDER — RANITIDINE HCL 150 MG PO TABS: 150.0000 mg | ORAL_TABLET | Freq: Two times a day (BID) | ORAL | 1 refills | Status: DC

## 2017-04-27 MED ORDER — NYSTATIN 100000 UNIT/GM EX POWD: CUTANEOUS | 2 refills | Status: DC

## 2017-04-27 NOTE — Assessment & Plan Note (Signed)
Clinically stable, obtain follow-up basic metabolic panel continue Demadex.  We were unable to weigh the patient today due to her being in a wheelchair.

## 2017-04-27 NOTE — Patient Instructions (Addendum)
Please complete lab work prior to leaving.   Food Choices for Gastroesophageal Reflux Disease, Adult When you have gastroesophageal reflux disease (GERD), the foods you eat and your eating habits are very important. Choosing the right foods can help ease your discomfort. What guidelines do I need to follow?  Choose fruits, vegetables, whole grains, and low-fat dairy products.  Choose low-fat meat, fish, and poultry.  Limit fats such as oils, salad dressings, butter, nuts, and avocado.  Keep a food diary. This helps you identify foods that cause symptoms.  Avoid foods that cause symptoms. These may be different for everyone.  Eat small meals often instead of 3 large meals a day.  Eat your meals slowly, in a place where you are relaxed.  Limit fried foods.  Cook foods using methods other than frying.  Avoid drinking alcohol.  Avoid drinking large amounts of liquids with your meals.  Avoid bending over or lying down until 2-3 hours after eating. What foods are not recommended? These are some foods and drinks that may make your symptoms worse: Vegetables Tomatoes. Tomato juice. Tomato and spaghetti sauce. Chili peppers. Onion and garlic. Horseradish. Fruits Oranges, grapefruit, and lemon (fruit and juice). Meats High-fat meats, fish, and poultry. This includes hot dogs, ribs, ham, sausage, salami, and bacon. Dairy Whole milk and chocolate milk. Sour cream. Cream. Butter. Ice cream. Cream cheese. Drinks Coffee and tea. Bubbly (carbonated) drinks or energy drinks. Condiments Hot sauce. Barbecue sauce. Sweets/Desserts Chocolate and cocoa. Donuts. Peppermint and spearmint. Fats and Oils High-fat foods. This includes French fries and potato chips. Other Vinegar. Strong spices. This includes black pepper, white pepper, red pepper, cayenne, curry powder, cloves, ginger, and chili powder. The items listed above may not be a complete list of foods and drinks to avoid. Contact your  dietitian for more information. This information is not intended to replace advice given to you by your health care provider. Make sure you discuss any questions you have with your health care provider. Document Released: 12/02/2011 Document Revised: 11/08/2015 Document Reviewed: 04/06/2013 Elsevier Interactive Patient Education  2017 Elsevier Inc.  

## 2017-04-27 NOTE — Assessment & Plan Note (Signed)
Discussed with patient that her intermittent fecal incontinence is likely related to her cervical myelopathy.  Her neurosurgeon is aware of this.  It is not clear that this will improve.  She continues to be wheelchair-bound with limited use of her upper extremities.  She is unable to stand or walk at this time.  I have advised her to remain out of work for another 3 months.  She continues to work with physical therapy.

## 2017-04-27 NOTE — Progress Notes (Signed)
Subjective:    Patient ID: Briana Miller, female    DOB: 02/13/1956, 61 y.o.   MRN: [id number]  HPI  Briana Miller is a 61 yr old female who presents today for follow up.  Intertrigo- reports foul odor beneath breasts and redness.   Cervical Myelopathy- she continues to have intermittent fecal incontinence.   Hyperglycemia-  Lab Results  Component Value Date   HGBA1C 5.9 (H) 08/02/2016   HGBA1C 6.4 02/21/2016   HGBA1C 6.4 05/22/2014   Lab Results  Component Value Date   LDLCALC 91 03/23/2015   CREATININE 0.81 01/28/2017    Lab Results  Component Value Date   HGBA1C 5.9 (H) 08/02/2016   HTN- BP medications include losartan 50mg , toprol xl 50mg .   BP Readings from Last 3 Encounters:  04/27/17 138/69  03/02/17 112/74  01/28/17 132/82   Gerd- continues zantac HS.  Reports that   CHF- continues demadex/kdur. Back on 9/17 cardiology changed her demadex from 10mg  daily to 10mg  alternating with 20mg . She never followed up for her repeat bmet.  Wt Readings from Last 3 Encounters:  08/05/16 252 lb 11.2 oz (114.6 kg)  08/01/16 275 lb (124.7 kg)  04/21/16 262 lb (118.8 kg)     Review of Systems Past Medical History:  Diagnosis Date  . Acute systolic congestive heart failure (HCC) 03/01/2014  . Allergy   . Anemia   . Arthritis   . Cardiomyopathy (HCC)   . CHF (congestive heart failure) (HCC)   . GERD (gastroesophageal reflux disease)   . Heart disease   . Hyperlipidemia   . Hypertension   . Leg pain   . Overactive bladder      Social History   Socioeconomic History  . Marital status: Married    Spouse name: Not on file  . Number of children: 2  . Years of education: HS  . Highest education level: Not on file  Social Needs  . Financial resource strain: Not on file  . Food insecurity - worry: Not on file  . Food insecurity - inability: Not on file  . Transportation needs - medical: Not on file  . Transportation needs - non-medical: Not on file    Occupational History  . 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 Smoking status: Never Smoker  . Smokeless tobacco: Never Used  Substance and Sexual Activity  . Alcohol use: Yes    Alcohol/week: 1.2 oz    Types: 2 Glasses of wine per week  . Drug use: No  . Sexual activity: Not on file  Other Topics Concern  . 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   1-2 cups caffeine daily.   Lives at home with her husband.    Past Surgical History:  Procedure Laterality Date  . COLONOSCOPY    . KNEE ARTHROSCOPY     2007  . ROTATOR CUFF REPAIR Right 03/23/13    Family History  Problem Relation Age of Onset  . Cancer Mother        colon 11 and breast 71- deceased  . Colon cancer Mother   . Diabetes Father  living  . Asthma Father   . Hypertension Father   . Heart attack Father 6579       medical management per pt  . Glaucoma Father        had eye transplant  . Kidney failure Father        acute renal failure- died 1692  . Esophageal cancer Neg Hx   . Stomach cancer Neg Hx   . Rectal cancer Neg Hx     No Known Allergies  Current Outpatient Medications on File Prior to Visit  Medication Sig Dispense Refill  . calcium-vitamin D (OSCAL WITH D) 500-200 MG-UNIT per tablet Take 1 tablet by mouth daily.    . celecoxib (CELEBREX) 100 MG capsule Take 1 capsule by mouth daily.    Marland Kitchen. gabapentin (NEURONTIN) 300 MG capsule Take 2 capsules (600 mg total) by mouth 3 (three) times daily. (Patient taking differently: Take 1,200 mg by mouth 3 (three) times daily. ) 180 capsule 5  . losartan (COZAAR) 50 MG tablet Take 1 tablet (50 mg total) by mouth daily. 90 tablet 1  . metoprolol succinate (TOPROL-XL)  50 MG 24 hr tablet TAKE 1 TABLET (50 MG TOTAL) BY MOUTH DAILY. TAKE WITH OR IMMEDIATELY FOLLOWING A MEAL. 90 tablet 1  . nystatin (MYCOSTATIN/NYSTOP) powder Apply bid beneath breasts 30 g 2  . potassium chloride SA (K-DUR,KLOR-CON) 20 MEQ tablet Take 1 tablet (20 mEq total) by mouth daily. 90 tablet 3  . ranitidine (ZANTAC) 150 MG tablet TAKE 1 TABLET (150 MG TOTAL) BY MOUTH AT BEDTIME. 90 tablet 1  . torsemide (DEMADEX) 10 MG tablet Take one tablet alternating with 2 tablets every other day 135 tablet 3   No current facility-administered medications on file prior to visit.     BP 138/69 (BP Location: Right Arm, Cuff Size: Large)   Pulse 74   Temp 98 F (36.7 C) (Oral)   Resp 18   Ht 5\' 6"  (1.676 m)   LMP 06/17/2007   SpO2 100%   BMI 40.79 kg/m       Objective:   Physical Exam  Constitutional: She is oriented to person, place, and time. She appears well-developed and well-nourished.  Obese AA female seated in wheelchair  HENT:  Head: Normocephalic and atraumatic.  Cardiovascular: Normal rate, regular rhythm and normal heart sounds.  No murmur heard. Pulmonary/Chest: Effort normal and breath sounds normal. No respiratory distress. She has no wheezes.  Musculoskeletal:  2+ bilateral LE edema  Neurological: She is alert and oriented to person, place, and time.  Skin: Skin is warm and dry.  Psychiatric: She has a normal mood and affect. Her behavior is normal. Judgment and thought content normal.   Skin: Bilateral hyperpigmented intertriginous rash noted beneath both breasts, no obvious cellulitis at this time       Assessment & Plan:  Intertrigo-uncontrolled, advised patient to restart nystatin powder twice daily and call if symptoms are not improved in 1 week.  Flu shot today

## 2017-04-27 NOTE — Assessment & Plan Note (Signed)
Blood pressure stable on current meds continue same.

## 2017-04-27 NOTE — Assessment & Plan Note (Signed)
Clinically stable, obtain follow-up basic metabolic panel

## 2017-04-28 ENCOUNTER — Telehealth: Payer: Self-pay | Admitting: Family

## 2017-04-28 ENCOUNTER — Ambulatory Visit: Payer: BLUE CROSS/BLUE SHIELD | Admitting: Family

## 2017-04-28 NOTE — Telephone Encounter (Addendum)
Copied from CRM [id number]. Topic: General - Other >> Apr 28, 2017 11:18 AM Elliot Gault wrote: Relation to pt: self  Call back number:[phone]   Reason for call:  Patient requested date change for Dr. Note excusing her from work to 07/28/17 requesting letter fax to  [phone]. Received confirmation.

## 2017-04-28 NOTE — Telephone Encounter (Signed)
Received refill request for Torsemide. Recent refills were sent by cardiology (Dr Jens Som). Denial sent to pharmacy and to contact cardiology.

## 2017-05-01 ENCOUNTER — Telehealth: Payer: Self-pay | Admitting: Family

## 2017-05-01 MED ORDER — GABAPENTIN 300 MG PO CAPS: 1200.0000 mg | ORAL_CAPSULE | Freq: Three times a day (TID) | ORAL | 5 refills | Status: DC

## 2017-05-01 NOTE — Telephone Encounter (Signed)
Sugar is stable, kidney function/electrolytes normla.

## 2017-05-01 NOTE — Telephone Encounter (Signed)
Pt called request refill Gabapentin 90 day supply called in to [facility] on [address] in [facility].

## 2017-05-01 NOTE — Telephone Encounter (Signed)
Refill sent. Notified pt that 30 day supply x 5 refills was sent and she voices understanding. Pt requests recent lab results.  Please advise?

## 2017-05-04 ENCOUNTER — Telehealth: Payer: Self-pay | Admitting: *Deleted

## 2017-05-04 MED ORDER — CELECOXIB 100 MG PO CAPS: 100.0000 mg | ORAL_CAPSULE | Freq: Every day | ORAL | 3 refills | Status: DC

## 2017-05-04 NOTE — Telephone Encounter (Signed)
Attempted to reach pt. Mailbox full, unable to leave message. Sent [employer].

## 2017-05-04 NOTE — Telephone Encounter (Signed)
Notified pt and she voices understanding. 

## 2017-05-04 NOTE — Telephone Encounter (Signed)
rx sent

## 2017-05-04 NOTE — Telephone Encounter (Signed)
Pt requests Refill of Celebrex. States this was being prescribed by a doctor in [place] for her arthritis but she has not been back to see him and is requesting [place] to refill.  Please advise?

## 2017-05-06 ENCOUNTER — Other Ambulatory Visit: Payer: Self-pay | Admitting: Family

## 2017-05-26 ENCOUNTER — Other Ambulatory Visit: Payer: Self-pay | Admitting: Family

## 2017-05-30 ENCOUNTER — Other Ambulatory Visit: Payer: Self-pay | Admitting: Family

## 2017-06-01 NOTE — Telephone Encounter (Signed)
Diflucan request denied. Pt should contact the office first.

## 2017-06-11 ENCOUNTER — Ambulatory Visit: Payer: BLUE CROSS/BLUE SHIELD | Admitting: Cardiology

## 2017-07-27 NOTE — Progress Notes (Deleted)
HPI: FU cardiomyopathy. Echocardiogram in October of 2014 showed an ejection fraction of 40-45%, mild left ventricular hypertrophy and mild left atrial enlargement. Nuclear study in December of 2014 showed an ejection fraction of 51% and normal perfusion. Last echo 2/18 showed EF 45-50, grade 1 DD, mild MR and TR. Since last seen   Current Outpatient Medications  Medication Sig Dispense Refill  . calcium-vitamin D (OSCAL WITH D) 500-200 MG-UNIT per tablet Take 1 tablet by mouth daily.    . celecoxib (CELEBREX) 100 MG capsule Take 1 capsule (100 mg total) daily by mouth. 30 capsule 3  . gabapentin (NEURONTIN) 300 MG capsule Take 4 capsules (1,200 mg total) 3 (three) times daily by mouth. 180 capsule 5  . losartan (COZAAR) 50 MG tablet TAKE 1 TABLET BY MOUTH EVERY DAY 90 tablet 1  . metoprolol succinate (TOPROL-XL) 50 MG 24 hr tablet TAKE 1 TABLET (50 MG TOTAL) BY MOUTH DAILY. TAKE WITH OR IMMEDIATELY FOLLOWING A MEAL. 90 tablet 1  . nystatin (MYCOSTATIN/NYSTOP) powder Apply bid beneath breasts 30 g 2  . potassium chloride SA (K-DUR,KLOR-CON) 20 MEQ tablet Take 1 tablet (20 mEq total) by mouth daily. 90 tablet 3  . ranitidine (ZANTAC) 150 MG tablet Take 1 tablet (150 mg total) 2 (two) times daily by mouth. 180 tablet 1  . torsemide (DEMADEX) 10 MG tablet Take one tablet alternating with 2 tablets every other day 135 tablet 3   No current facility-administered medications for this visit.      Past Medical History:  Diagnosis Date  . Acute systolic congestive heart failure (HCC) 03/01/2014  . Allergy   . Anemia   . Arthritis   . Cardiomyopathy (HCC)   . CHF (congestive heart failure) (HCC)   . GERD (gastroesophageal reflux disease)   . Heart disease   . Hyperlipidemia   . Hypertension   . Leg pain   . Overactive bladder     Past Surgical History:  Procedure Laterality Date  . ANTERIOR CERVICAL DECOMP/DISCECTOMY FUSION N/A 08/03/2016   Procedure: ANTERIOR CERVICAL  DECOMPRESSION/DISCECTOMY FUSION C4-5, C5-6;  Surgeon: Barnett Abu, MD;  Location: [facility] OR;  Service: Neurosurgery;  Laterality: N/A;  . COLONOSCOPY    . KNEE ARTHROSCOPY     2007  . ROTATOR CUFF REPAIR Right 03/23/13  . TOTAL KNEE ARTHROPLASTY  09/22/2011   Procedure: TOTAL KNEE ARTHROPLASTY;  Surgeon: Raymon Mutton, MD;  Location: [facility] OR;  Service: Orthopedics;  Laterality: Right;    Social History   Socioeconomic History  . Marital status: Married    Spouse name: Not on file  . Number of children: 2  . Years of education: HS  . Highest education level: Not on file  Social Needs  . Financial resource strain: Not on file  . Food insecurity - worry: Not on file  . Food insecurity - inability: Not on file  . Transportation needs - medical: Not on file  . Transportation needs - non-medical: Not on file  Occupational History  . 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 Smoking status: Never Smoker  . Smokeless tobacco: Never Used  Substance and Sexual Activity  . Alcohol use: Yes    Alcohol/week: 1.2 oz    Types: 2 Glasses of wine per week  . Drug use: No  . Sexual activity: Not on file  Other Topics Concern  . 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   1-2 cups caffeine daily.   Lives at home with her husband.    Family History  Problem Relation Age of Onset  . Cancer Mother        colon 44 and breast 70- deceased  . Colon cancer Mother   . Diabetes Father        living  . Asthma Father   . Hypertension Father   . Heart attack Father 23       medical management per pt  . Glaucoma Father        had eye transplant  . Kidney failure Father        acute renal failure- died 57  .  Esophageal cancer Neg Hx   . Stomach cancer Neg Hx   . Rectal cancer Neg Hx     ROS: no fevers or chills, productive cough, hemoptysis, dysphasia, odynophagia, melena, hematochezia, dysuria, hematuria, rash, seizure activity, orthopnea, PND, pedal edema, claudication. Remaining systems are negative.  Physical Exam: Well-developed well-nourished in no acute distress.  Skin is warm and dry.  HEENT is normal.  Neck is supple.  Chest is clear to auscultation with normal expansion.  Cardiovascular exam is regular rate and rhythm.  Abdominal exam nontender or distended. No masses palpated. Extremities show no edema. neuro grossly intact  ECG- personally reviewed  A/P  1 cardiomyopathy-LV function is mildly reduced on most recent echocardiogram.  Plan to continue ARB and beta-blocker.  2 chronic combined systolic/diastolic congestive heart failure-patient appears to be euvolemic on examination.  Continue present dose of diuretic.  Continue low-sodium diet and fluid restriction.  Check potassium and renal function.  3 hypertension-blood pressure is controlled.  Continue present medications.  4 morbid obesity-we discussed the importance of weight loss.  We will again arrange a pulmonary evaluation for possible sleep apnea.  Olga Millers, MD

## 2017-07-28 ENCOUNTER — Ambulatory Visit: Payer: BLUE CROSS/BLUE SHIELD | Admitting: Family

## 2017-07-28 ENCOUNTER — Encounter: Payer: Self-pay | Admitting: Family

## 2017-07-28 DIAGNOSIS — Z0289 Encounter for other administrative examinations: Secondary | ICD-10-CM

## 2017-07-31 ENCOUNTER — Encounter: Payer: Self-pay | Admitting: Family

## 2017-07-31 ENCOUNTER — Ambulatory Visit: Payer: BLUE CROSS/BLUE SHIELD | Admitting: Family

## 2017-07-31 ENCOUNTER — Telehealth: Payer: Self-pay | Admitting: Family

## 2017-07-31 ENCOUNTER — Telehealth: Payer: Self-pay

## 2017-07-31 DIAGNOSIS — G959 Disease of spinal cord, unspecified: Secondary | ICD-10-CM | POA: Diagnosis not present

## 2017-07-31 DIAGNOSIS — R739 Hyperglycemia, unspecified: Secondary | ICD-10-CM

## 2017-07-31 DIAGNOSIS — I509 Heart failure, unspecified: Secondary | ICD-10-CM

## 2017-07-31 DIAGNOSIS — G894 Chronic pain syndrome: Secondary | ICD-10-CM

## 2017-07-31 DIAGNOSIS — R1011 Right upper quadrant pain: Secondary | ICD-10-CM

## 2017-07-31 LAB — BASIC METABOLIC PANEL
BUN: 22 mg/dL (ref 6–23)
CALCIUM: 9.4 mg/dL (ref 8.4–10.5)
CO2: 29 mEq/L (ref 19–32)
CREATININE: 0.95 mg/dL (ref 0.40–1.20)
Chloride: 103 mEq/L (ref 96–112)
GFR: 77.28 mL/min (ref >60.00)
GLUCOSE: 102 mg/dL — AB (ref 70–99)
Potassium: 4.2 mEq/L (ref 3.5–5.1)
SODIUM: 141 meq/L (ref 135–145)

## 2017-07-31 LAB — HEMOGLOBIN A1C: Hgb A1c MFr Bld: 6.2 % (ref 4.6–6.5)

## 2017-07-31 MED ORDER — INSULIN PEN NEEDLE 32G X 4 MM MISC: 5 refills | Status: DC

## 2017-07-31 MED ORDER — LIRAGLUTIDE -WEIGHT MANAGEMENT 18 MG/3ML ~~LOC~~ SOPN: 0.6000 mg | PEN_INJECTOR | Freq: Every day | SUBCUTANEOUS | 5 refills | Status: DC

## 2017-07-31 MED ORDER — TRAMADOL HCL 50 MG PO TABS
50.0000 mg | ORAL_TABLET | Freq: Three times a day (TID) | ORAL | 0 refills | Status: DC | PRN
Start: 2017-07-31 — End: 2017-09-23

## 2017-07-31 NOTE — Telephone Encounter (Signed)
PA initiated via Covermymeds; KEY: [id number]. Awaiting determination.

## 2017-07-31 NOTE — Telephone Encounter (Signed)
Copied from CRM [id number]. Topic: Quick Communication - See Telephone Encounter >> Jul 31, 2017 12:42 PM Floria Raveling A wrote: CRM for notification. See Telephone encounter for: pt called in and said that the injection that Dr wanted her to get Bernie Covey) is going to be $1200.00 , she said can not afford that and would like to know other options   Best number [phone]  07/31/17.

## 2017-07-31 NOTE — Progress Notes (Signed)
Subjective:    Patient ID: Briana Miller, female    DOB: November 18, 1955, 62 y.o.   MRN: [id number]  HPI  Patient is a 62 year old female who presents today for follow-up of multiple medical problems.  Hypertension-maintained on losartan, metoprolol and demadex. Has some chronic LE edema.   BP Readings from Last 3 Encounters:  07/31/17 (!) 142/67  04/27/17 138/69  03/02/17 112/74   Hyperglycemia-she is not currently on diabetic medication. Lab Results  Component Value Date   HGBA1C 6.1 04/27/2017   CHF- she is maintained on Demadex. Wt Readings from Last 3 Encounters:  07/31/17 285 lb (129.3 kg)  08/05/16 252 lb 11.2 oz (114.6 kg)  08/01/16 275 lb (124.7 kg)   Cervical Myelopathy-   Osteoarthritis-patient states that she has some chronic left knee pain.  Has been told by orthopedics that she will need total knee replacement but must lose weight prior to surgery.   Lab Results  Component Value Date   CREATININE 0.80 04/27/2017     Review of Systems    see HPI  Past Medical History:  Diagnosis Date  . Acute systolic congestive heart failure (HCC) 03/01/2014  . Allergy   . Anemia   . Arthritis   . Cardiomyopathy (HCC)   . CHF (congestive heart failure) (HCC)   . GERD (gastroesophageal reflux disease)   . Heart disease   . Hyperlipidemia   . Hypertension   . Leg pain   . Overactive bladder      Social History   Socioeconomic History  . Marital status: Married    Spouse name: Not on file  . Number of children: 2  . Years of education: HS  . Highest education level: Not on file  Social Needs  . Financial resource strain: Not on file  . Food insecurity - worry: Not on file  . Food insecurity - inability: Not on file  . Transportation needs - medical: Not on file  . Transportation needs - non-medical: Not on file  Occupational History  . 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 Smoking status: Never Smoker  . Smokeless tobacco:  Never Used  Substance and Sexual Activity  . Alcohol use: Yes    Alcohol/week: 1.2 oz    Types: 2 Glasses of wine per week  . Drug use: No  . Sexual activity: Not on file  Other Topics Concern  . 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   1-2 cups caffeine daily.   Lives at home with her husband.    Past Surgical History:  Procedure Laterality Date  . ANTERIOR CERVICAL DECOMP/DISCECTOMY FUSION N/A 08/03/2016   Procedure: ANTERIOR CERVICAL DECOMPRESSION/DISCECTOMY FUSION C4-5, C5-6;  Surgeon: Barnett Abu, MD;  Location: [facility] OR;  Service: Neurosurgery;  Laterality: N/A;  . COLONOSCOPY    . KNEE ARTHROSCOPY     2007  . ROTATOR CUFF REPAIR Right 03/23/13  . TOTAL KNEE ARTHROPLASTY  09/22/2011   Procedure: TOTAL KNEE ARTHROPLASTY;  Surgeon: Raymon Mutton, MD;  Location: [facility] OR;  Service: Orthopedics;  Laterality: Right;    Family History  Problem Relation Age of Onset  . Cancer Mother  colon 60 and breast 54- deceased  . Colon cancer Mother   . Diabetes Father        living  . Asthma Father   . Hypertension Father   . Heart attack Father 27       medical management per pt  . Glaucoma Father        had eye transplant  . Kidney failure Father        acute renal failure- died 66  . Esophageal cancer Neg Hx   . Stomach cancer Neg Hx   . Rectal cancer Neg Hx     No Known Allergies  Current Outpatient Medications on File Prior to Visit  Medication Sig Dispense Refill  . calcium-vitamin D (OSCAL WITH D) 500-200 MG-UNIT per tablet Take 1 tablet by mouth daily.    . celecoxib (CELEBREX) 100 MG capsule Take 1 capsule (100 mg total) daily by mouth. 30 capsule 3  . gabapentin (NEURONTIN) 300 MG capsule Take 4  capsules (1,200 mg total) 3 (three) times daily by mouth. 180 capsule 5  . losartan (COZAAR) 50 MG tablet TAKE 1 TABLET BY MOUTH EVERY DAY 90 tablet 1  . metoprolol succinate (TOPROL-XL) 50 MG 24 hr tablet TAKE 1 TABLET (50 MG TOTAL) BY MOUTH DAILY. TAKE WITH OR IMMEDIATELY FOLLOWING A MEAL. 90 tablet 1  . nystatin (MYCOSTATIN/NYSTOP) powder Apply bid beneath breasts 30 g 2  . potassium chloride SA (K-DUR,KLOR-CON) 20 MEQ tablet Take 1 tablet (20 mEq total) by mouth daily. 90 tablet 3  . ranitidine (ZANTAC) 150 MG tablet Take 1 tablet (150 mg total) 2 (two) times daily by mouth. 180 tablet 1  . torsemide (DEMADEX) 10 MG tablet Take one tablet alternating with 2 tablets every other day 135 tablet 3   No current facility-administered medications on file prior to visit.     BP (!) 142/67 (BP Location: Right Arm, Cuff Size: Large)   Pulse 76   Temp 97.9 F (36.6 C) (Oral)   Resp 18   Ht 5\' 6"  (1.676 m)   Wt 285 lb (129.3 kg)   LMP 06/17/2007   SpO2 100%   BMI 46.00 kg/m    Objective:   Physical Exam  Constitutional: She is oriented to person, place, and time. She appears well-developed and well-nourished. No distress.  Cardiovascular: Normal rate and regular rhythm.  No murmur heard. Pulmonary/Chest: Effort normal and breath sounds normal. No respiratory distress. She has no wheezes. She has no rales. She exhibits no tenderness.  Musculoskeletal:  2+ RLE edema, 3+ LLE edema  Neurological: She is alert and oriented to person, place, and time.  Psychiatric: She has a normal mood and affect. Her behavior is normal. Judgment and thought content normal.          Assessment & Plan:  Morbid obesity- we discussed weight loss.  She is unable to exercise effectively due to her paraplegia.  She is not a good candidate for phentermine due to her cardiac history nor is she a good candidate for valve week for the same reasons.  I have given her a prescription for Saxenda.  We will see if we  can get authorization from her insurance for her to begin this.  If she is successfully able to obtain the medication I have asked her to schedule a nurse visit so we can teach her how to administer.  Hypertension-blood pressure stable on current medications.  Continue same.  CHF-she appears clinically compensated.  She has follow-up next  week with cardiology.  Continue Demadex.

## 2017-07-31 NOTE — Assessment & Plan Note (Signed)
She is still unable to walk.  She remains wheelchair-bound.  She is mentally motivated to return to work but is currently physically unable to work at her previous job.  I have provided her with an excuse note for her employer for the next 3 months.

## 2017-07-31 NOTE — Assessment & Plan Note (Signed)
Her pain at this point is mainly in the knee.  She is requesting a prescription for as needed tramadol.  A controlled substance contract is signed today and a urine drug screen is ordered.  Review of the [place] controlled substance registry is performed and refills are appropriate.  It is her goal to lose weight so that she could potentially undergo left total knee replacement.

## 2017-07-31 NOTE — Patient Instructions (Addendum)
Once you get your saxenda, please schedule a nurse visit for teaching. Complete lab work prior to leaving.

## 2017-07-31 NOTE — Telephone Encounter (Signed)
That is the only weight loss medication that I really think is safe for her with her other medications.  I would recommend referral to medical weight loss clinic at this point. I am unable to place referral to Dr. Dalbert Garnet in Epic, are you able to place order please.

## 2017-07-31 NOTE — Telephone Encounter (Signed)
PA approved. Effective 07/01/2017 through 07/31/2018.

## 2017-08-01 LAB — PAIN MGMT, PROFILE 8 W/CONF, U
6 ACETYLMORPHINE: NEGATIVE ng/mL (ref <10)
ALCOHOL METABOLITES: NEGATIVE ng/mL (ref <500)
Amphetamines: NEGATIVE ng/mL (ref <500)
Benzodiazepines: NEGATIVE ng/mL (ref <100)
Buprenorphine, Urine: NEGATIVE ng/mL (ref <5)
Cocaine Metabolite: NEGATIVE ng/mL (ref <150)
Creatinine: 161 mg/dL
MDMA: NEGATIVE ng/mL (ref <500)
Marijuana Metabolite: NEGATIVE ng/mL (ref <20)
OPIATES: NEGATIVE ng/mL (ref <100)
OXIDANT: NEGATIVE ug/mL (ref <200)
OXYCODONE: NEGATIVE ng/mL (ref <100)
pH: 6.01 (ref 4.5–9.0)

## 2017-08-03 NOTE — Telephone Encounter (Signed)
Spoke with Zella Ball, CSR at [employer] and was told that her plan does not cover any weight loss medication. Notified pt and placed referral to Dr Dalbert Garnet. Provided  # to pt to contact Dr Francena Hanly office to schedule an information session.

## 2017-08-06 ENCOUNTER — Ambulatory Visit: Payer: BLUE CROSS/BLUE SHIELD | Admitting: Cardiology

## 2017-08-06 ENCOUNTER — Encounter: Payer: Self-pay | Admitting: Family

## 2017-08-10 ENCOUNTER — Ambulatory Visit (HOSPITAL_BASED_OUTPATIENT_CLINIC_OR_DEPARTMENT_OTHER): Payer: BLUE CROSS/BLUE SHIELD

## 2017-08-25 ENCOUNTER — Ambulatory Visit: Payer: BLUE CROSS/BLUE SHIELD | Admitting: Gastroenterology

## 2017-09-03 ENCOUNTER — Other Ambulatory Visit: Payer: Self-pay | Admitting: Family

## 2017-09-09 ENCOUNTER — Ambulatory Visit (INDEPENDENT_AMBULATORY_CARE_PROVIDER_SITE_OTHER): Payer: BLUE CROSS/BLUE SHIELD

## 2017-09-09 ENCOUNTER — Encounter (INDEPENDENT_AMBULATORY_CARE_PROVIDER_SITE_OTHER): Payer: Self-pay | Admitting: Orthopaedic Surgery

## 2017-09-09 ENCOUNTER — Ambulatory Visit (INDEPENDENT_AMBULATORY_CARE_PROVIDER_SITE_OTHER): Payer: BLUE CROSS/BLUE SHIELD | Admitting: Orthopaedic Surgery

## 2017-09-09 DIAGNOSIS — M25562 Pain in left knee: Secondary | ICD-10-CM | POA: Diagnosis not present

## 2017-09-09 DIAGNOSIS — M1712 Unilateral primary osteoarthritis, left knee: Secondary | ICD-10-CM

## 2017-09-09 DIAGNOSIS — G8929 Other chronic pain: Secondary | ICD-10-CM | POA: Diagnosis not present

## 2017-09-09 NOTE — Progress Notes (Signed)
Office Visit Note   Patient: Briana Miller           Date of Birth: 1956/01/23           MRN: [id number] Visit Date: 09/09/2017              Requested by: Sandford Craze, NP [address] [place], [place] [address] PCP: Sandford Craze, NP   Assessment & Plan: Visit Diagnoses:  1. Chronic pain of left knee   2. Unilateral primary osteoarthritis, left knee     Plan: Given the combination of her obesity as well as the severe deformity of her left knee I am not comfortable with proceeding with any type of knee replacement surgery.  I told her this is out of my realm of expertise and addressingher knee such as hers do to the difficulty involved given her size and soft tissue envelope combined with a near fixed deformity.  Follow-Up Instructions: Return if symptoms worsen or fail to improve.   Orders:  Orders Placed This Encounter  Procedures  . XR KNEE 3 VIEW LEFT   No orders of the defined types were placed in this encounter.     Procedures: No procedures performed   Clinical Data: No additional findings.   Subjective: Chief Complaint  Patient presents with  . Left Knee - Pain  The patient is a 62 year old female missing for the first time.  She mainly only gets around in a wheelchair and is been nonweightbearing on her left knee for 2 years now.  She is tried physical therapy on her left knee due to severe pain but it did not work for her.  She has a remote history of a right total knee arthroplasty done in 2013 by another physician in town.  She is someone who is morbidly obese.  She is a history of congestive heart failure in the past.    HPI  Review of Systems She currently denies any headache, chest pain, shortness of breath, fever, chills, nausea, vomiting  Objective: Vital Signs: LMP 06/17/2007   Physical Exam She is alert and oriented in no acute distress and is in a wheelchair Ortho Exam Examination of her left knee she is  abundant soft tissue from morbid obesity.  Her knee is in a fixed flexed position.  I cannot extend her knee and cannot really flex at either.  She has venous stasis changes in her shin. Specialty Comments:  No specialty comments available.  Imaging: Xr Knee 3 View Left  Result Date: 09/09/2017 2 views of the left knee show severe end-stage arthritic changes.  It appears that the knee is in a fixed flexed position.  There is abundant soft tissue due to morbid obesity.    PMFS History: Patient Active Problem List   Diagnosis Date Noted  . Unilateral primary osteoarthritis, left knee 09/09/2017  . Chronic pain syndrome 07/31/2017  . Knee pain 10/21/2016  . Cervical myelopathy (HCC)   . Peripheral edema 08/01/2016  . Debility 08/01/2016  . HTN (hypertension) 02/25/2016  . Arthritis 01/20/2016  . Overactive bladder 06/23/2015  . GERD (gastroesophageal reflux disease) 06/23/2015  . CHF (congestive heart failure) (HCC) 05/15/2015  . Migraines 03/23/2015  . Borderline diabetes 05/18/2014  . Allergic rhinitis 06/01/2013  . Congestive dilated cardiomyopathy (HCC) 06/01/2013  . OSA (obstructive sleep apnea) 04/08/2013  . Family history of colon cancer 04/08/2013  . Severe obesity (BMI >= 40) (HCC) 04/08/2013   Past Medical History:  Diagnosis Date  .  Acute systolic congestive heart failure (HCC) 03/01/2014  . Allergy   . Anemia   . Arthritis   . Cardiomyopathy (HCC)   . CHF (congestive heart failure) (HCC)   . GERD (gastroesophageal reflux disease)   . Heart disease   . Hyperlipidemia   . Hypertension   . Leg pain   . Overactive bladder     Family History  Problem Relation Age of Onset  . Cancer Mother        colon 42 and breast 64- deceased  . Colon cancer Mother   . Diabetes Father        living  . Asthma Father   . Hypertension Father   . Heart attack Father 1       medical management per pt  . Glaucoma Father        had eye transplant  . Kidney failure Father          acute renal failure- died 72  . Esophageal cancer Neg Hx   . Stomach cancer Neg Hx   . Rectal cancer Neg Hx     Past Surgical History:  Procedure Laterality Date  . ANTERIOR CERVICAL DECOMP/DISCECTOMY FUSION N/A 08/03/2016   Procedure: ANTERIOR CERVICAL DECOMPRESSION/DISCECTOMY FUSION C4-5, C5-6;  Surgeon: Barnett Abu, MD;  Location: [facility] OR;  Service: Neurosurgery;  Laterality: N/A;  . COLONOSCOPY    . KNEE ARTHROSCOPY     2007  . ROTATOR CUFF REPAIR Right 03/23/13  . TOTAL KNEE ARTHROPLASTY  09/22/2011   Procedure: TOTAL KNEE ARTHROPLASTY;  Surgeon: Raymon Mutton, MD;  Location: [facility] OR;  Service: Orthopedics;  Laterality: Right;   Social History   Occupational History  . 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 Smoking status: Never Smoker  . Smokeless tobacco: Never Used  Substance and Sexual Activity  . Alcohol use: Yes    Alcohol/week: 1.2 oz    Types: 2 Glasses of wine per week  . Drug use: No  . Sexual activity: Not on file

## 2017-09-16 ENCOUNTER — Other Ambulatory Visit: Payer: Self-pay | Admitting: Family

## 2017-09-17 ENCOUNTER — Other Ambulatory Visit: Payer: Self-pay | Admitting: Family

## 2017-09-17 NOTE — Telephone Encounter (Signed)
Last tramadol RX: 07/31/17, #30 Last OV: 07/31/17 Next OV: 10/23/17 UDS: 07/31/17 CSC:  07/31/17 CSR: See NARX report in PCP's red folder and advise?

## 2017-09-18 ENCOUNTER — Other Ambulatory Visit: Payer: Self-pay | Admitting: Family

## 2017-09-20 NOTE — Telephone Encounter (Signed)
I see that she filled rx for phentermine from another provider.  This violates her controlled substance contract so we will not be able to provide her with any additional controlled substances.

## 2017-09-23 MED ORDER — TRAMADOL HCL 50 MG PO TABS: 50.0000 mg | ORAL_TABLET | Freq: Three times a day (TID) | ORAL | 0 refills | Status: DC | PRN

## 2017-09-23 NOTE — Telephone Encounter (Signed)
I recommend that she discontinue use of phentermine.  This is not a safe medication for her given her history of heart problems and hypertension.  Please let her know that I will continue to prescribe her tramadol however if she has any further controlled substances filled from other providers without checking with [place] first we will need to discontinue further controlled substance refills from our office.

## 2017-09-23 NOTE — Addendum Note (Signed)
Addended by: Sandford Craze on: 09/23/2017 09:24 PM   Modules accepted: Orders

## 2017-09-23 NOTE — Telephone Encounter (Signed)
Notified pt of below. She states she did not know that phentermine was a controlled substance. She states she was unable to follow through with the referral to Dr Dalbert Garnet because it was very time intensive (requires multiple visits) and she has to rely on her daughter for transportation. Pt states she went to a different group and was prescribed phentermine to help her lose weight in preparation of her upcoming surgery.  Please advise?

## 2017-10-02 ENCOUNTER — Other Ambulatory Visit: Payer: Self-pay | Admitting: Family

## 2017-10-05 ENCOUNTER — Encounter: Payer: Self-pay | Admitting: Family

## 2017-10-05 ENCOUNTER — Telehealth: Payer: Self-pay | Admitting: *Deleted

## 2017-10-05 ENCOUNTER — Ambulatory Visit: Payer: BLUE CROSS/BLUE SHIELD | Admitting: Family

## 2017-10-05 ENCOUNTER — Ambulatory Visit (HOSPITAL_BASED_OUTPATIENT_CLINIC_OR_DEPARTMENT_OTHER)
Admission: RE | Admit: 2017-10-05 | Discharge: 2017-10-05 | Disposition: A | Discharge status: Home / Self Care | Payer: BLUE CROSS/BLUE SHIELD | Source: Ambulatory Visit | Attending: Family | Admitting: Family

## 2017-10-05 VITALS — BP 117/71 | HR 72 | Temp 98.2°F | Resp 20 | Ht 66.0 in | Wt 285.0 lb

## 2017-10-05 DIAGNOSIS — Z1231 Encounter for screening mammogram for malignant neoplasm of breast: Secondary | ICD-10-CM | POA: Diagnosis present

## 2017-10-05 DIAGNOSIS — R2242 Localized swelling, mass and lump, left lower limb: Secondary | ICD-10-CM

## 2017-10-05 DIAGNOSIS — Z0000 Encounter for general adult medical examination without abnormal findings: Secondary | ICD-10-CM

## 2017-10-05 DIAGNOSIS — G959 Disease of spinal cord, unspecified: Secondary | ICD-10-CM

## 2017-10-05 DIAGNOSIS — M5440 Lumbago with sciatica, unspecified side: Secondary | ICD-10-CM | POA: Diagnosis not present

## 2017-10-05 DIAGNOSIS — M545 Low back pain, unspecified: Secondary | ICD-10-CM

## 2017-10-05 NOTE — Telephone Encounter (Signed)
Pt was seen in the office today and requested that previous return to work letter dated 08/06/17 be faxed to attn: Burke Keels @ [phone].  Letter has been faxed.

## 2017-10-05 NOTE — Patient Instructions (Addendum)
Please complete mammogram on the first floor.  Schedule ultrasound of your leg in the imaging department. You should be contacted about your referral to Dr. Danielle Dess. Let us know if you have not been contacted in 1 week.  Follow up as scheduled.

## 2017-10-05 NOTE — Progress Notes (Signed)
Subjective:    Patient ID: Briana Miller, female    DOB: January 05, 1956, 62 y.o.   MRN: [id number]  HPI  Patient is a 62 yr old female who presents today for follow up.  She reports 2 concerns today.  1) pain left groin-she reports intermittent sharp shooting pain into the left groin.  This is worse during sexual intercourse.  She denies any vaginal pain or vaginal issues.  Pain is made worse when she brings her legs together.  She does report some recent low back pain.  2) "knot"-reports firmness in the left lower extremity.  3) Cervical myelopathy-she continues to have intermittent bowel and bladder incontinence.  She also continues to have upper extremity numbness.  She remains wheelchair-bound.  She has been followed by neurosurgery Dr. Danielle Dess in the past.   Review of Systems    see HPI  Past Medical History:  Diagnosis Date  . Acute systolic congestive heart failure (HCC) 03/01/2014  . Allergy   . Anemia   . Arthritis   . Cardiomyopathy (HCC)   . CHF (congestive heart failure) (HCC)   . GERD (gastroesophageal reflux disease)   . Heart disease   . Hyperlipidemia   . Hypertension   . Leg pain   . Overactive bladder      Social History   Socioeconomic History  . Marital status: Married    Spouse name: Not on file  . Number of children: 2  . Years of education: HS  . Highest education level: Not on file  Occupational History  . 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 Financial resource strain: Not on file  . Food insecurity:    Worry: Not on file    Inability: Not on file  . Transportation needs:    Medical: Not on file    Non-medical: Not on file  Tobacco Use  . Smoking status: Never Smoker  . Smokeless tobacco: Never Used  Substance and Sexual Activity  . Alcohol use: Yes    Alcohol/week: 1.2 oz    Types: 2 Glasses of wine per week  . Drug use: No  . Sexual activity: Not on file  Lifestyle  . Physical activity:    Days per  week: Not on file    Minutes per session: Not on file  . Stress: Not on file  Relationships  . Social connections:    Talks on phone: Not on file    Gets together: Not on file    Attends religious service: Not on file    Active member of club or organization: Not on file    Attends meetings of clubs or organizations: Not on file    Relationship status: Not on file  . Intimate partner violence:    Fear of current or ex partner: Not on file    Emotionally abused: Not on file    Physically abused: Not on file    Forced sexual activity: Not on file  Other Topics Concern  . 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   1-2 cups caffeine daily.   Lives at home with her husband.    Past Surgical  History:  Procedure Laterality Date  . ANTERIOR CERVICAL DECOMP/DISCECTOMY FUSION N/A 08/03/2016   Procedure: ANTERIOR CERVICAL DECOMPRESSION/DISCECTOMY FUSION C4-5, C5-6;  Surgeon: Barnett Abu, MD;  Location: [facility] OR;  Service: Neurosurgery;  Laterality: N/A;  . COLONOSCOPY    . KNEE ARTHROSCOPY     2007  . ROTATOR CUFF REPAIR Right 03/23/13  . TOTAL KNEE ARTHROPLASTY  09/22/2011   Procedure: TOTAL KNEE ARTHROPLASTY;  Surgeon: Raymon Mutton, MD;  Location: [facility] OR;  Service: Orthopedics;  Laterality: Right;    Family History  Problem Relation Age of Onset  . Cancer Mother        colon 32 and breast 69- deceased  . Colon cancer Mother   . Diabetes Father        living  . Asthma Father   . Hypertension Father   . Heart attack Father 62       medical management per pt  . Glaucoma Father        had eye transplant  . Kidney failure Father        acute renal failure- died 51  . Esophageal cancer Neg Hx   . Stomach cancer Neg  Hx   . Rectal cancer Neg Hx     No Known Allergies  Current Outpatient Medications on File Prior to Visit  Medication Sig Dispense Refill  . calcium-vitamin D (OSCAL WITH D) 500-200 MG-UNIT per tablet Take 1 tablet by mouth daily.    . celecoxib (CELEBREX) 100 MG capsule Take 1 capsule (100 mg total) daily by mouth. 30 capsule 3  . gabapentin (NEURONTIN) 300 MG capsule TAKE 4 CAPSULES 3 TIMES DAILY BY MOUTH. 180 capsule 5  . Insulin Pen Needle (NOVOFINE PLUS) 32G X 4 MM MISC Use as directed. 30 each 5  . latanoprost (XALATAN) 0.005 % ophthalmic solution Place 1 drop into both eyes at bedtime.  11  . losartan (COZAAR) 50 MG tablet TAKE 1 TABLET BY MOUTH EVERY DAY 90 tablet 1  . Melatonin 3 MG TABS Take 1 tablet by mouth at bedtime.    . metoprolol succinate (TOPROL-XL) 50 MG 24 hr tablet TAKE 1 TABLET (50 MG TOTAL) BY MOUTH DAILY. TAKE WITH OR IMMEDIATELY FOLLOWING A MEAL. 90 tablet 1  . nystatin (MYCOSTATIN/NYSTOP) powder APPLY TO AFFECTED AREA TWICE A DAY BENEATH BREASTS 30 g 2  . potassium chloride SA (K-DUR,KLOR-CON) 20 MEQ tablet Take 1 tablet (20 mEq total) by mouth daily. 90 tablet 3  . ranitidine (ZANTAC) 150 MG tablet Take 1 tablet (150 mg total) 2 (two) times daily by mouth. 180 tablet 1  . torsemide (DEMADEX) 10 MG tablet Take one tablet alternating with 2 tablets every other day 135 tablet 3  . traMADol (ULTRAM) 50 MG tablet Take 1 tablet (50 mg total) by mouth every 8 (eight) hours as needed. 30 tablet 0   No current facility-administered medications on file prior to visit.     BP 117/71   Pulse 72   Temp 98.2 F (36.8 C)   Resp 20   Ht 5\' 6"  (1.676 m)   Wt 285 lb (129.3 kg) Comment: with pt. reported weight of W/C of 30lbs.  LMP 06/17/2007   SpO2 100%   BMI 46.00 kg/m    Objective:   Physical Exam  Constitutional: She is oriented to person, place, and time. She appears well-developed and well-nourished.  Cardiovascular: Normal rate, regular rhythm and normal  heart sounds.  No murmur heard. Pulmonary/Chest: Effort normal and breath sounds  normal. No respiratory distress. She has no wheezes.  Musculoskeletal:  2+ bilateral LE edema Non-tender area of induration noted left anterior leg beneath knee medially.   Left thigh is without erythema or tenderness.  Neurological: She is alert and oriented to person, place, and time.  Psychiatric: She has a normal mood and affect. Her behavior is normal. Judgment and thought content normal.          Assessment & Plan:  Leg mass- suspect lipoma-we will check ultrasound to further evaluate.   History of cervical myelopathy-she has chronic lower extremity weakness and ongoing numbness in the upper extremities.  This is at baseline.  She remains wheelchair-bound.    Low back pain with  radiculopathy-suspect lumbar disc disease.  Refer back to neurosurgery.  She is already maintained on Celebrex and tramadol for pain.  She is also on gabapentin.  Morbid obesity-also of note she continues to work on losing weight.  She hopes to have her left knee replaced after she loses additional weight.  She has stopped phentermine which was given to her by weight loss clinic.  She looked into Optifast but this is not covered by her insurance.  Left groin pain-I suspect that her pain radiating into the left groin is related to lumbar disc disease.  I have advised follow-up with her neurosurgeon.  Referral has been placed.

## 2017-10-06 NOTE — Progress Notes (Deleted)
HPI: FU cardiomyopathy. Echocardiogram in October of 2014 showed an ejection fraction of 40-45%, mild left ventricular hypertrophy and mild left atrial enlargement. Nuclear study in December of 2014 showed an ejection fraction of 51% and normal perfusion. Last echo 2/18 showed EF 45-50, grade 1 DD, mild MR and TR. Since last seen    Current Outpatient Medications  Medication Sig Dispense Refill  . calcium-vitamin D (OSCAL WITH D) 500-200 MG-UNIT per tablet Take 1 tablet by mouth daily.    . celecoxib (CELEBREX) 100 MG capsule Take 1 capsule (100 mg total) daily by mouth. 30 capsule 3  . gabapentin (NEURONTIN) 300 MG capsule TAKE 4 CAPSULES 3 TIMES DAILY BY MOUTH. 180 capsule 5  . Insulin Pen Needle (NOVOFINE PLUS) 32G X 4 MM MISC Use as directed. 30 each 5  . latanoprost (XALATAN) 0.005 % ophthalmic solution Place 1 drop into both eyes at bedtime.  11  . losartan (COZAAR) 50 MG tablet TAKE 1 TABLET BY MOUTH EVERY DAY 90 tablet 1  . Melatonin 3 MG TABS Take 1 tablet by mouth at bedtime.    . metoprolol succinate (TOPROL-XL) 50 MG 24 hr tablet TAKE 1 TABLET (50 MG TOTAL) BY MOUTH DAILY. TAKE WITH OR IMMEDIATELY FOLLOWING A MEAL. 90 tablet 1  . nystatin (MYCOSTATIN/NYSTOP) powder APPLY TO AFFECTED AREA TWICE A DAY BENEATH BREASTS 30 g 2  . potassium chloride SA (K-DUR,KLOR-CON) 20 MEQ tablet Take 1 tablet (20 mEq total) by mouth daily. 90 tablet 3  . ranitidine (ZANTAC) 150 MG tablet Take 1 tablet (150 mg total) 2 (two) times daily by mouth. 180 tablet 1  . torsemide (DEMADEX) 10 MG tablet Take one tablet alternating with 2 tablets every other day 135 tablet 3  . traMADol (ULTRAM) 50 MG tablet Take 1 tablet (50 mg total) by mouth every 8 (eight) hours as needed. 30 tablet 0   No current facility-administered medications for this visit.      Past Medical History:  Diagnosis Date  . Acute systolic congestive heart failure (HCC) 03/01/2014  . Allergy   . Anemia   . Arthritis   .  Cardiomyopathy (HCC)   . Cervical myelopathy (HCC)   . CHF (congestive heart failure) (HCC)   . GERD (gastroesophageal reflux disease)   . Heart disease   . Hyperlipidemia   . Hypertension   . Leg pain   . Overactive bladder     Past Surgical History:  Procedure Laterality Date  . ANTERIOR CERVICAL DECOMP/DISCECTOMY FUSION N/A 08/03/2016   Procedure: ANTERIOR CERVICAL DECOMPRESSION/DISCECTOMY FUSION C4-5, C5-6;  Surgeon: Barnett Abu, MD;  Location: [facility] OR;  Service: Neurosurgery;  Laterality: N/A;  . COLONOSCOPY    . KNEE ARTHROSCOPY     2007  . ROTATOR CUFF REPAIR Right 03/23/13  . TOTAL KNEE ARTHROPLASTY  09/22/2011   Procedure: TOTAL KNEE ARTHROPLASTY;  Surgeon: Raymon Mutton, MD;  Location: [facility] OR;  Service: Orthopedics;  Laterality: Right;    Social History   Socioeconomic History  . Marital status: Married    Spouse name: Not on file  . Number of children: 2  . Years of education: HS  . Highest education level: Not on file  Occupational History  . 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  Social Needs  . Financial resource strain: Not on file  . Food insecurity:    Worry: Not on file    Inability: Not on file  . Transportation needs:  Medical: Not on file    Non-medical: Not on file  Tobacco Use  . Smoking status: Never Smoker  . Smokeless tobacco: Never Used  Substance and Sexual Activity  . Alcohol use: Yes    Alcohol/week: 1.2 oz    Types: 2 Glasses of wine per week  . Drug use: No  . Sexual activity: Not on file  Lifestyle  . Physical activity:    Days per week: Not on file    Minutes per session: Not on file  . Stress: Not on file  Relationships  . Social connections:    Talks on phone: Not on file    Gets together: Not on file    Attends religious service: Not on file    Active member of club or organization: Not on file    Attends meetings of clubs or organizations: Not on file    Relationship status: Not on file  . Intimate partner  violence:    Fear of current or ex partner: Not on file    Emotionally abused: Not on file    Physically abused: Not on file    Forced sexual activity: Not on file  Other Topics Concern  . 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   1-2 cups caffeine daily.   Lives at home with her husband.    Family History  Problem Relation Age of Onset  . Cancer Mother        colon 88 and breast 45- deceased  . Colon cancer Mother   . Diabetes Father        living  . Asthma Father   . Hypertension Father   . Heart attack Father 43       medical management per pt  . Glaucoma Father        had eye transplant  . Kidney failure Father        acute renal failure- died 47  . Esophageal cancer Neg Hx   . Stomach cancer Neg Hx   . Rectal cancer Neg Hx     ROS: no fevers or chills, productive cough, hemoptysis, dysphasia, odynophagia, melena, hematochezia, dysuria, hematuria, rash, seizure activity, orthopnea, PND, pedal edema, claudication. Remaining systems are negative.  Physical Exam: Well-developed well-nourished in no acute distress.  Skin is warm and dry.  HEENT is normal.  Neck is supple.  Chest is clear to auscultation with normal expansion.  Cardiovascular exam is regular rate and rhythm.  Abdominal exam nontender or distended. No masses palpated. Extremities show no edema. neuro grossly intact  ECG- personally reviewed  A/P  1  Olga Millers, MD

## 2017-10-14 ENCOUNTER — Ambulatory Visit: Payer: BLUE CROSS/BLUE SHIELD | Admitting: Cardiology

## 2017-10-14 ENCOUNTER — Encounter

## 2017-10-16 ENCOUNTER — Telehealth: Payer: Self-pay | Admitting: *Deleted

## 2017-10-16 NOTE — Telephone Encounter (Signed)
Spoke with pt and she requests that we fax xray result from March with Dr Magnus Ivan. Result has been faxed. Pt aware.  Copied from CRM [id number]. Topic: General - Other >> Oct 16, 2017  2:56 PM Gerrianne Scale wrote: Reason for CRM: patient calling wanting the provider to fax over her lt knee xrays she would like for it to be sent to [facility] at [facility]) [phone] (F) [phone]

## 2017-10-23 ENCOUNTER — Ambulatory Visit: Payer: BLUE CROSS/BLUE SHIELD | Admitting: Family

## 2017-10-23 NOTE — Telephone Encounter (Signed)
Patient states that the fax was never received. Would like it refaxed to [phone] (F).   CB#: [phone]

## 2017-10-26 ENCOUNTER — Encounter: Payer: Self-pay | Admitting: Family

## 2017-10-26 ENCOUNTER — Telehealth (INDEPENDENT_AMBULATORY_CARE_PROVIDER_SITE_OTHER): Payer: Self-pay | Admitting: Orthopaedic Surgery

## 2017-10-26 ENCOUNTER — Telehealth: Payer: Self-pay | Admitting: *Deleted

## 2017-10-26 ENCOUNTER — Telehealth: Payer: Self-pay | Admitting: Family

## 2017-10-26 ENCOUNTER — Ambulatory Visit (INDEPENDENT_AMBULATORY_CARE_PROVIDER_SITE_OTHER): Payer: BLUE CROSS/BLUE SHIELD | Admitting: Family

## 2017-10-26 VITALS — BP 130/74 | HR 83 | Temp 97.9°F | Resp 16 | Ht 66.0 in | Wt 255.0 lb

## 2017-10-26 DIAGNOSIS — R601 Generalized edema: Secondary | ICD-10-CM

## 2017-10-26 DIAGNOSIS — M5410 Radiculopathy, site unspecified: Secondary | ICD-10-CM

## 2017-10-26 DIAGNOSIS — G959 Disease of spinal cord, unspecified: Secondary | ICD-10-CM

## 2017-10-26 DIAGNOSIS — R2240 Localized swelling, mass and lump, unspecified lower limb: Secondary | ICD-10-CM

## 2017-10-26 MED ORDER — LOSARTAN POTASSIUM 50 MG PO TABS: 50.0000 mg | ORAL_TABLET | Freq: Every day | ORAL | 1 refills | Status: DC

## 2017-10-26 MED ORDER — TRAMADOL HCL 50 MG PO TABS: 50.0000 mg | ORAL_TABLET | Freq: Three times a day (TID) | ORAL | 0 refills | Status: DC | PRN

## 2017-10-26 MED ORDER — TORSEMIDE 10 MG PO TABS: ORAL_TABLET | ORAL | 3 refills | Status: DC

## 2017-10-26 MED ORDER — METOPROLOL SUCCINATE ER 50 MG PO TB24: 50.0000 mg | ORAL_TABLET | Freq: Every day | ORAL | 1 refills | Status: DC

## 2017-10-26 MED ORDER — POTASSIUM CHLORIDE CRYS ER 20 MEQ PO TBCR: 20.0000 meq | EXTENDED_RELEASE_TABLET | Freq: Every day | ORAL | 3 refills | Status: DC

## 2017-10-26 NOTE — Telephone Encounter (Signed)
Can you please tell her we can't fax xrays, they would never show up, if she wants them she would need to have them burnt on a disc and that is $5 I believe

## 2017-10-26 NOTE — Telephone Encounter (Signed)
Result note faxed and pt advised I do not have full access to the actual xray report and she may need to have Dr Eliberto Ivory office fax the reports.

## 2017-10-26 NOTE — Progress Notes (Signed)
Subjective:    Patient ID: Briana Miller, female    DOB: September 06, 1955, 62 y.o.   MRN: [id number]  HPI  Briana Miller is a 62 yr old female who presents today for follow up.  HTN- patient is maintained on toprol xl and losartan.  BP Readings from Last 3 Encounters:  10/26/17 130/74  10/05/17 117/71  07/31/17 (!) 142/67   Leg mass- last visit she was concerned about a leg mass. She reports that she did not complete US as she needs the order sent to [place] for insurance purposes. She notes an area of hardening on the right leg which she would like examined at the same time as the left leg.   Low back pain with radiculopathy- last visit we placed a referral back to her neurosurgeon due to ongoing low back pain and radicular symptoms. She reports she was not contacted about this appointment. Reports pain is unchanged.   Morbid obesity- weight today is 255 pounds. This was after CMA subtracted the 30 pounds for wheelchair. This was not previously subtracted on previous visits.   Wt Readings from Last 3 Encounters:  10/26/17 255 lb (115.7 kg)  10/05/17 285 lb (129.3 kg)  07/31/17 285 lb (129.3 kg)     Review of Systems See HPI  Past Medical History:  Diagnosis Date  . Acute systolic congestive heart failure (HCC) 03/01/2014  . Allergy   . Anemia   . Arthritis   . Cardiomyopathy (HCC)   . Cervical myelopathy (HCC)   . CHF (congestive heart failure) (HCC)   . GERD (gastroesophageal reflux disease)   . Heart disease   . Hyperlipidemia   . Hypertension   . Leg pain   . Overactive bladder      Social History   Socioeconomic History  . Marital status: Married    Spouse name: Not on file  . Number of children: 2  . Years of education: HS  . Highest education level: Not on file  Occupational History  . Occupation: Acupuncturist: [employer]  Social Needs  . Financial resource strain: Not on file  . Food insecurity:    Worry: Not on file    Inability: Not on  file  . Transportation needs:    Medical: Not on file    Non-medical: Not on file  Tobacco Use  . Smoking status: Never Smoker  . Smokeless tobacco: Never Used  Substance and Sexual Activity  . Alcohol use: Yes    Alcohol/week: 1.2 oz    Types: 2 Glasses of wine per week  . Drug use: No  . Sexual activity: Not on file  Lifestyle  . Physical activity:    Days per week: Not on file    Minutes per session: Not on file  . Stress: Not on file  Relationships  . Social connections:    Talks on phone: Not on file    Gets together: Not on file    Attends religious service: Not on file    Active member of club or organization: Not on file    Attends meetings of clubs or organizations: Not on file    Relationship status: Not on file  . Intimate partner violence:    Fear of current or ex partner: Not on file    Emotionally abused: Not on file    Physically abused: Not on file    Forced sexual activity: Not on file  Other Topics Concern  . 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   1-2 cups caffeine daily.   Lives at home with her husband.    Past Surgical History:  Procedure Laterality Date  . ANTERIOR CERVICAL DECOMP/DISCECTOMY FUSION N/A 08/03/2016   Procedure: ANTERIOR CERVICAL DECOMPRESSION/DISCECTOMY FUSION C4-5, C5-6;  Surgeon: Barnett Abu, MD;  Location: [facility] OR;  Service: Neurosurgery;  Laterality: N/A;  . COLONOSCOPY    . KNEE ARTHROSCOPY     2007  . ROTATOR CUFF REPAIR Right 03/23/13  . TOTAL KNEE ARTHROPLASTY  09/22/2011   Procedure: TOTAL KNEE ARTHROPLASTY;  Surgeon: Raymon Mutton, MD;  Location: [facility] OR;  Service: Orthopedics;  Laterality: Right;    Family History  Problem Relation Age of Onset  .  Cancer Mother        colon 7 and breast 66- deceased  . Colon cancer Mother   . Diabetes Father        living  . Asthma Father   . Hypertension Father   . Heart attack Father 85       medical management per pt  . Glaucoma Father        had eye transplant  . Kidney failure Father        acute renal failure- died 57  . Esophageal cancer Neg Hx   . Stomach cancer Neg Hx   . Rectal cancer Neg Hx     No Known Allergies  Current Outpatient Medications on File Prior to Visit  Medication Sig Dispense Refill  . calcium-vitamin D (OSCAL WITH D) 500-200 MG-UNIT per tablet Take 1 tablet by mouth daily.    . celecoxib (CELEBREX) 100 MG capsule TAKE 1 CAPSULE (100 MG TOTAL) DAILY BY MOUTH. 30 capsule 3  . gabapentin (NEURONTIN) 300 MG capsule TAKE 4 CAPSULES 3 TIMES DAILY BY MOUTH. 180 capsule 5  . Insulin Pen Needle (NOVOFINE PLUS) 32G X 4 MM MISC Use as directed. 30 each 5  . latanoprost (XALATAN) 0.005 % ophthalmic solution Place 1 drop into both eyes at bedtime.  11  . losartan (COZAAR) 50 MG tablet TAKE 1 TABLET BY MOUTH EVERY DAY 90 tablet 1  . Melatonin 3 MG TABS Take 1 tablet by mouth at bedtime.    . metoprolol succinate (TOPROL-XL) 50 MG 24 hr tablet TAKE 1 TABLET (50 MG TOTAL) BY MOUTH DAILY. TAKE WITH OR IMMEDIATELY FOLLOWING A MEAL. 90 tablet 1  . nystatin (MYCOSTATIN/NYSTOP) powder APPLY TO AFFECTED AREA TWICE A DAY BENEATH BREASTS 30 g 2  . potassium chloride SA (K-DUR,KLOR-CON) 20 MEQ tablet Take 1 tablet (20 mEq total) by mouth daily. 90 tablet 3  . ranitidine (ZANTAC) 150 MG tablet Take 1 tablet (150 mg total) 2 (two) times daily by mouth. 180 tablet 1  . torsemide (DEMADEX) 10 MG tablet Take one tablet alternating with 2 tablets every other day 135 tablet 3  . traMADol (ULTRAM) 50 MG tablet Take 1 tablet (50 mg total) by mouth every 8 (eight) hours as needed. 30 tablet 0   No current facility-administered medications on file prior to visit.     BP 130/74 (BP Location:  Right Arm, Cuff Size: Large)   Pulse 83   Temp 97.9 F (  36.6 C) (Oral)   Resp 16   Ht 5\' 6"  (1.676 m)   Wt 255 lb (115.7 kg) Comment: Wt was 285 subtract 30lb for wheelchair per pt  LMP 06/17/2007   SpO2 100%   BMI 41.16 kg/m       Objective:   Physical Exam  Constitutional: She is oriented to person, place, and time. She appears well-developed and well-nourished.  Cardiovascular: Normal rate, regular rhythm and normal heart sounds.  No murmur heard. Pulmonary/Chest: Effort normal and breath sounds normal. No respiratory distress. She has no wheezes.  Musculoskeletal:  2+-3+ bilateral LE edema.   Firm induration noted left leg beneath knee medially,  Firm induration left anterior leg mid way anteriorly  Neurological: She is alert and oriented to person, place, and time.  Skin: Skin is warm and dry.  Psychiatric: She has a normal mood and affect. Her behavior is normal. Judgment and thought content normal.          Assessment & Plan:  Leg mass- suspect left leg is lipoma and right leg is fluid. Will refer for [place] at [facility] for further evaluation.  Low back pain with radiculopathy- unchanged- will check status of referral to neurosurgery.  Cervical myelopathy- she remains wheelchair bound and unable to work. I have given her a note for her employer.

## 2017-10-26 NOTE — Telephone Encounter (Signed)
Pt has not heard from Dr. Verlee Rossetti office, referral was faxed 4/22. Could you please contact them and pt and ensure that this appointment is scheduled.

## 2017-10-26 NOTE — Telephone Encounter (Signed)
Patient called asking if her most recent xrays could be faxed over to [facility] at [phone]

## 2017-10-26 NOTE — Patient Instructions (Addendum)
Please continue to work on weight loss. We will check status of your referral back to Dr. Danielle Dess. You should be contacted about your referral to Dr. Danielle Dess and to [facility] for imaging on your legs.

## 2017-10-26 NOTE — Telephone Encounter (Signed)
[name]-- can you fax referral / order that was placed by PCP today to the below #?   Copied from CRM [id number]. Topic: Referral - Question >> Oct 26, 2017  1:37 PM [name] wrote: Reason for [id number] calling needing order for [place], firm tissue noted left lower extremity beneath left knee anteriorly and on right leg mid medial calf. Fax # [phone]

## 2017-10-27 ENCOUNTER — Telehealth (INDEPENDENT_AMBULATORY_CARE_PROVIDER_SITE_OTHER): Payer: Self-pay | Admitting: Orthopaedic Surgery

## 2017-10-27 NOTE — Telephone Encounter (Signed)
Patient called and stated she needs 2 disc of x-rays and will be in town tomorrow and wants to pick up.  Please call patient to advise.  [phone]

## 2017-10-27 NOTE — Telephone Encounter (Signed)
CD's of knee xray has been made and she can get them tomorrow.  She is aware that there is a $5 fee for [facility] CD.

## 2017-11-05 NOTE — Telephone Encounter (Signed)
[name], I want to see if she was scheduled with Dr. Danielle Dess and if not, please schedule.

## 2017-11-13 ENCOUNTER — Ambulatory Visit: Payer: BLUE CROSS/BLUE SHIELD | Admitting: Cardiology

## 2017-11-13 ENCOUNTER — Ambulatory Visit: Payer: BLUE CROSS/BLUE SHIELD | Admitting: Gastroenterology

## 2017-11-13 ENCOUNTER — Encounter

## 2017-11-15 ENCOUNTER — Other Ambulatory Visit: Payer: Self-pay | Admitting: Family

## 2017-11-16 ENCOUNTER — Encounter: Payer: Self-pay | Admitting: Gastroenterology

## 2017-11-16 ENCOUNTER — Ambulatory Visit (INDEPENDENT_AMBULATORY_CARE_PROVIDER_SITE_OTHER): Payer: BLUE CROSS/BLUE SHIELD | Admitting: Gastroenterology

## 2017-11-16 DIAGNOSIS — R140 Abdominal distension (gaseous): Secondary | ICD-10-CM

## 2017-11-16 MED ORDER — ONDANSETRON HCL 4 MG PO TABS: ORAL_TABLET | ORAL | 6 refills | Status: DC

## 2017-11-16 NOTE — Progress Notes (Signed)
Review of pertinent gastrointestinal problems: 1. Family history of colon cancer; mother in her early 13s; colonoscopy Dr. Christella Hartigan 05/2013 found diverticulosis, no polyps, recommended recall at 5 years. 2. RUQ pains: clinically seemed biliary-like; 05/2013 [place] [facility] was normal; 05/2013 HIDA scan with GB EF was normal.05/2013 LFTs were normal. Was recommended, scheduled for EGD but she cancelled it.  EGD 05/2014 Dr. Christella Hartigan, mild to moderate gastritis; path showed no H. Pylori.  She had been taking BC powders twice a day (around 2013-2015).  Repeat EGD June 2017, Dr. Christella Hartigan.  This was requested by her bariatric surgeon prior to surgery given her history of gastritis.  I noted a small about of food residue in the stomach, the examination was otherwise normal.    3. Morbid obesity BMI 41 (10/2017 data)   HPI: This is a very pleasant 62 year old woman whom I last saw about 2 years ago at the time of an upper endoscopy.  See those results summarized above  She did not have the bypass surgery in the end; she was told by her surgeon that it would make things worse. Not sure what he meant by that.  She is here today because she has a lot of belching all day.  This is associated with some nausea without vomiting however.  She will belch food that she ate the day prior.  Also pyrosis.  She takes ranitidine. She takes this twice daily (AM and bedtime).  Tried omeprazole due to leg issues???  Says that late at night she would have pain in her leg.  Overall weight going down, intentionally in order to be able to have knee replacement; has lost 20 poiunds and is trying to lose 15 more.  She takes tramadol 50mg  once (about three times per week total).  Chief complaint is belching, nausea  ROS: complete GI ROS as described in HPI, all other review negative.  Constitutional:  No unintentional weight loss   Past Medical History:  Diagnosis Date  . Acute systolic congestive heart failure (HCC) 03/01/2014  .  Allergy   . Anemia   . Arthritis   . Cardiomyopathy (HCC)   . Cervical myelopathy (HCC)   . CHF (congestive heart failure) (HCC)   . GERD (gastroesophageal reflux disease)   . Heart disease   . Hyperlipidemia   . Hypertension   . Leg pain   . Overactive bladder     Past Surgical History:  Procedure Laterality Date  . ANTERIOR CERVICAL DECOMP/DISCECTOMY FUSION N/A 08/03/2016   Procedure: ANTERIOR CERVICAL DECOMPRESSION/DISCECTOMY FUSION C4-5, C5-6;  Surgeon: Barnett Abu, MD;  Location: [facility] OR;  Service: Neurosurgery;  Laterality: N/A;  . COLONOSCOPY    . KNEE ARTHROSCOPY     2007  . ROTATOR CUFF REPAIR Right 03/23/13  . TOTAL KNEE ARTHROPLASTY  09/22/2011   Procedure: TOTAL KNEE ARTHROPLASTY;  Surgeon: Raymon Mutton, MD;  Location: [facility] OR;  Service: Orthopedics;  Laterality: Right;    Current Outpatient Medications  Medication Sig Dispense Refill  . calcium-vitamin D (OSCAL WITH D) 500-200 MG-UNIT per tablet Take 1 tablet by mouth daily.    . celecoxib (CELEBREX) 100 MG capsule TAKE 1 CAPSULE (100 MG TOTAL) DAILY BY MOUTH. 30 capsule 3  . gabapentin (NEURONTIN) 300 MG capsule TAKE 4 CAPSULES 3 TIMES DAILY BY MOUTH. 180 capsule 5  . latanoprost (XALATAN) 0.005 % ophthalmic solution Place 1 drop into both eyes at bedtime.  11  . losartan (COZAAR) 50 MG tablet Take 1 tablet (50 mg total)  by mouth daily. 90 tablet 1  . Melatonin 3 MG TABS Take 1 tablet by mouth at bedtime.    . metoprolol succinate (TOPROL-XL) 50 MG 24 hr tablet Take 1 tablet (50 mg total) by mouth daily. Take with or immediately following a meal. 90 tablet 1  . nystatin (MYCOSTATIN/NYSTOP) powder APPLY TO AFFECTED AREA TWICE A DAY BENEATH BREASTS 30 g 2  . potassium chloride SA (K-DUR,KLOR-CON) 20 MEQ tablet Take 1 tablet (20 mEq total) by mouth daily. 90 tablet 3  . ranitidine (ZANTAC) 150 MG tablet Take 1 tablet (150 mg total) 2 (two) times daily by mouth. 180 tablet 1  . torsemide (DEMADEX) 10 MG tablet Take one  tablet alternating with 2 tablets every other day 135 tablet 3  . traMADol (ULTRAM) 50 MG tablet Take 1 tablet (50 mg total) by mouth every 8 (eight) hours as needed. 30 tablet 0   No current facility-administered medications for this visit.     Allergies as of 11/16/2017  . (No Known Allergies)    Family History  Problem Relation Age of Onset  . Cancer Mother        colon 5 and breast 82- deceased  . Colon cancer Mother   . Diabetes Father        living  . Asthma Father   . Hypertension Father   . Heart attack Father 60       medical management per pt  . Glaucoma Father        had eye transplant  . Kidney failure Father        acute renal failure- died 74  . Esophageal cancer Neg Hx   . Stomach cancer Neg Hx   . Rectal cancer Neg Hx     Social History   Socioeconomic History  . Marital status: Married    Spouse name: Not on file  . Number of children: 2  . Years of education: HS  . Highest education level: Not on file  Occupational History  . Occupation: Acupuncturist: [employer]  Social Needs  . Financial resource strain: Not on file  . Food insecurity:    Worry: Not on file    Inability: Not on file  . Transportation needs:    Medical: Not on file    Non-medical: Not on file  Tobacco Use  . Smoking status: Never Smoker  . Smokeless tobacco: Never Used  Substance and Sexual Activity  . Alcohol use: Yes    Alcohol/week: 1.2 oz    Types: 2 Glasses of wine per week  . Drug use: No  . Sexual activity: Not on file  Lifestyle  . Physical activity:    Days per week: Not on file    Minutes per session: Not on file  . Stress: Not on file  Relationships  . Social connections:    Talks on phone: Not on file    Gets together: Not on file    Attends religious service: Not on file    Active member of club or organization: Not on file    Attends meetings of clubs or organizations: Not on file    Relationship status: Not on file  . Intimate partner  violence:    Fear of current or ex partner: Not on file    Emotionally abused: Not on file    Physically abused: Not on file    Forced sexual activity: Not on file  Other Topics Concern  . 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   1-2 cups caffeine daily.   Lives at home with her husband.     Physical Exam: BP 118/64 (BP Location: Left Arm, Patient Position: Sitting, Cuff Size: Large)   Pulse 72   LMP 06/17/2007  Constitutional: generally well-appearing except for morbid obesity, she is sitting in a wheelchair Psychiatric: alert and oriented x3 Abdomen: soft, nontender, nondistended, no obvious ascites, no peritoneal signs, normal bowel sounds No peripheral edema noted in lower extremities  Assessment and plan: 62 y.o. female with morbid obesity, belching, nausea  I think her biggest issue is her morbid obesity.  This is causing some orthopedic issues which in turn is leading her to be more immobile.  She is essentially confined to a wheelchair for the past 2 years.  I recommended she do her best to try to stay as mobile as possible as the GI tract will functio worse and worse as her immobility increases.  I did note some small amount of food residue in her stomach 2 years ago and I wonder if she has an underlying problem with gastric motility.  I do not think that was surprised me in her setting with her morbid obesity and wheelchair confinement.  She does not have diabetes surprisingly.  Going to order a gastric emptying scan to see if we can quantify any underlying gastric dysmotility.  Following that I will make further recommendations.  Please see the "Patient Instructions" section for addition details about the  plan.  Rob Bunting, MD [facility] 11/16/2017, 10:43 AM

## 2017-11-16 NOTE — Patient Instructions (Addendum)
Gastric emptying scan for nausea, bloating. Zofran 4mg  pills, take one pill once daily every morning for nausea, dispense 30 with 6 refills.  You have been scheduled for a gastric emptying scan at [facility] on 12/03/17 at 730am. Please arrive at least 15 minutes prior to your appointment for registration. Please make certain not to have anything to eat or drink after midnight the night before your test. Hold all stomach medications (ex: Zofran, phenergan, Reglan) 48 hours prior to your test. If you need to reschedule your appointment, please contact radiology scheduling at [phone]. _____________________________________________________________________ A gastric-emptying study measures how long it takes for food to move through your stomach. There are several ways to measure stomach emptying. In the most common test, you eat food that contains a small amount of radioactive material. A scanner that detects the movement of the radioactive material is placed over your abdomen to monitor the rate at which food leaves your stomach. This test normally takes about 4 hours to complete. ____________________________   _________________________________________

## 2017-11-23 ENCOUNTER — Ambulatory Visit: Payer: BLUE CROSS/BLUE SHIELD | Admitting: Family

## 2017-11-23 ENCOUNTER — Encounter: Payer: Self-pay | Admitting: Family

## 2017-11-23 VITALS — BP 120/65 | HR 74 | Temp 98.1°F | Resp 18 | Ht 66.0 in

## 2017-11-23 DIAGNOSIS — L304 Erythema intertrigo: Secondary | ICD-10-CM

## 2017-11-23 DIAGNOSIS — G959 Disease of spinal cord, unspecified: Secondary | ICD-10-CM

## 2017-11-23 NOTE — Progress Notes (Addendum)
Subjective:    Patient ID: Briana Miller, female    DOB: 21-Nov-1955, 62 y.o.   MRN: [id number]  HPI   [name] is a 62 yr old female who presents today to discuss return to work. She is wondering if it is appropriate for her to return to work and work at a desk job. She would be working with her hands. She remains wheelchair bound, incontinent, and has UE numbness/weakness.    She also notes a rash beneath her breasts. Reports that she is using nystatin powder without improvement.    Review of Systems   see HPI  Past Medical History:  Diagnosis Date  . Acute systolic congestive heart failure (HCC) 03/01/2014  . Allergy   . Anemia   . Arthritis   . Cardiomyopathy (HCC)   . Cervical myelopathy (HCC)   . CHF (congestive heart failure) (HCC)   . GERD (gastroesophageal reflux disease)   . Heart disease   . Hyperlipidemia   . Hypertension   . Leg pain   . Overactive bladder      Social History   Socioeconomic History  . Marital status: Married    Spouse name: Not on file  . Number of children: 2  . Years of education: HS  . Highest education level: Not on file  Occupational History  . Occupation: Acupuncturist: [employer]  Social Needs  . Financial resource strain: Not on file  . Food insecurity:    Worry: Not on file    Inability: Not on file  . Transportation needs:    Medical: Not on file    Non-medical: Not on file  Tobacco Use  . Smoking status: Never Smoker  . Smokeless tobacco: Never Used  Substance and Sexual Activity  . Alcohol use: Yes    Alcohol/week: 1.2 oz    Types: 2 Glasses of wine per week  . Drug use: No  . Sexual activity: Not on file  Lifestyle  . Physical activity:    Days per week: Not on file    Minutes per session: Not on file  . Stress: Not on file  Relationships  . Social connections:    Talks on phone: Not on file    Gets together: Not on file    Attends religious service: Not on file    Active member of club  or organization: Not on file    Attends meetings of clubs or organizations: Not on file    Relationship status: Not on file  . Intimate partner violence:    Fear of current or ex partner: Not on file    Emotionally abused: Not on file    Physically abused: Not on file    Forced sexual activity: Not on file  Other Topics Concern  . 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   1-2 cups caffeine daily.   Lives at home with her husband.    Past Surgical History:  Procedure Laterality Date  . ANTERIOR CERVICAL DECOMP/DISCECTOMY FUSION N/A 08/03/2016   Procedure: ANTERIOR CERVICAL DECOMPRESSION/DISCECTOMY FUSION C4-5, C5-6;  Surgeon: Barnett Abu, MD;  Location: [facility] OR;  Service: Neurosurgery;  Laterality: N/A;  .  COLONOSCOPY    . KNEE ARTHROSCOPY     2007  . ROTATOR CUFF REPAIR Right 03/23/13  . TOTAL KNEE ARTHROPLASTY  09/22/2011   Procedure: TOTAL KNEE ARTHROPLASTY;  Surgeon: Raymon Mutton, MD;  Location: [facility] OR;  Service: Orthopedics;  Laterality: Right;    Family History  Problem Relation Age of Onset  . Cancer Mother        colon 23 and breast 67- deceased  . Colon cancer Mother   . Diabetes Father        living  . Asthma Father   . Hypertension Father   . Heart attack Father 18       medical management per pt  . Glaucoma Father        had eye transplant  . Kidney failure Father        acute renal failure- died 15  . Esophageal cancer Neg Hx   . Stomach cancer Neg Hx   . Rectal cancer Neg Hx     No Known Allergies  Current Outpatient Medications on File Prior to Visit  Medication Sig Dispense Refill  . calcium-vitamin D (OSCAL WITH D) 500-200 MG-UNIT per tablet Take 1 tablet by mouth daily.    .  celecoxib (CELEBREX) 100 MG capsule TAKE 1 CAPSULE (100 MG TOTAL) DAILY BY MOUTH. 30 capsule 3  . gabapentin (NEURONTIN) 300 MG capsule TAKE 4 CAPSULES 3 TIMES DAILY BY MOUTH. 180 capsule 5  . latanoprost (XALATAN) 0.005 % ophthalmic solution Place 1 drop into both eyes at bedtime.  11  . losartan (COZAAR) 50 MG tablet Take 1 tablet (50 mg total) by mouth daily. 90 tablet 1  . Melatonin 3 MG TABS Take 1 tablet by mouth at bedtime.    . metoprolol succinate (TOPROL-XL) 50 MG 24 hr tablet Take 1 tablet (50 mg total) by mouth daily. Take with or immediately following a meal. 90 tablet 1  . nystatin (MYCOSTATIN/NYSTOP) powder APPLY TO AFFECTED AREA TWICE A DAY BENEATH BREASTS 30 g 2  . ondansetron (ZOFRAN) 4 MG tablet Once daily every morning for nausea 30 tablet 6  . potassium chloride SA (K-DUR,KLOR-CON) 20 MEQ tablet Take 1 tablet (20 mEq total) by mouth daily. 90 tablet 3  . ranitidine (ZANTAC) 150 MG tablet TAKE 1 TABLET (150 MG TOTAL) 2 (TWO) TIMES DAILY BY MOUTH. 180 tablet 1  . torsemide (DEMADEX) 10 MG tablet Take one tablet alternating with 2 tablets every other day 135 tablet 3  . traMADol (ULTRAM) 50 MG tablet Take 1 tablet (50 mg total) by mouth every 8 (eight) hours as needed. 30 tablet 0   No current facility-administered medications on file prior to visit.     BP 120/65 (BP Location: Left Arm, Cuff Size: Large)   Pulse 74   Temp 98.1 F (36.7 C) (Oral)   Resp 18   Ht 5\' 6"  (1.676 m)   LMP 06/17/2007   SpO2 100%   BMI 41.16 kg/m    Objective:   Physical Exam  Constitutional: She appears well-developed and well-nourished.  Seated in wheelchair  Cardiovascular: Normal rate, regular rhythm and normal heart sounds.  No murmur heard. Pulmonary/Chest: Effort normal and breath sounds normal. No respiratory distress. She has no wheezes.  Musculoskeletal:  1+ bilateral LE edema  Skin:  Fungal rash beneath breasts noted  Psychiatric: She has a normal mood and affect. Her  behavior is normal. Judgment and thought content normal.  Assessment & Plan:  Cervical Myelopathy- I advised the patient that I do not think she can return to work. She is not independent with toileting and has clumsiness, weakness, numbness of her upper extremities so I do not think she would be able to work with her hands.  I have suggested that she apply for permanent disability and not return to work. She is agreeable to this plan and will notify her employer that she will not be returning.  A total of 15 minutes were spent face-to-face with the patient during this encounter and over half of that time was spent on counseling and coordination of care. The patient was counseled on her cervical myelopathy, functional status and disability.   Intertrigo- rx sent for oral diflucan.

## 2017-11-26 ENCOUNTER — Telehealth: Payer: Self-pay | Admitting: Family

## 2017-11-26 NOTE — Telephone Encounter (Signed)
Copied from CRM [id number]. Topic: Quick Communication - See Telephone Encounter >> Nov 26, 2017  3:10 PM Waymon Amato wrote: Pt called to say that she was supposed to have a pill called in for infection -no one has given her a name -all she knows it that she is to take a pill on this week and one next week and it was not at her pharmacy   Best number [phone]

## 2017-11-27 MED ORDER — FLUCONAZOLE 150 MG PO TABS: ORAL_TABLET | ORAL | 0 refills | Status: DC

## 2017-11-27 NOTE — Telephone Encounter (Signed)
rx sent for diflucan

## 2017-11-30 NOTE — Telephone Encounter (Signed)
Notified pt and she voices understanding. 

## 2017-12-01 NOTE — Progress Notes (Signed)
HPI: FU cardiomyopathy. Echocardiogram in October of 2014 showed an ejection fraction of 40-45%, mild left ventricular hypertrophy and mild left atrial enlargement. Nuclear study in December of 2014 showed an ejection fraction of 51% and normal perfusion. Last echo 2/18 showed EF 45-50, grade 1 DD, mild MR and TR. Since last seen  patient denies dyspnea.  She had 2 episodes of chest discomfort radiating to her left upper extremity lasting for several seconds but otherwise has not had chest pain.  She has mild pedal edema.  No syncope.  Current Outpatient Medications  Medication Sig Dispense Refill  . calcium-vitamin D (OSCAL WITH D) 500-200 MG-UNIT per tablet Take 1 tablet by mouth daily.    . celecoxib (CELEBREX) 100 MG capsule TAKE 1 CAPSULE (100 MG TOTAL) DAILY BY MOUTH. 30 capsule 3  . fluconazole (DIFLUCAN) 150 MG tablet 1 tab by mouth today and again in 1 week. 2 tablet 0  . gabapentin (NEURONTIN) 300 MG capsule TAKE 4 CAPSULES 3 TIMES DAILY BY MOUTH. 180 capsule 5  . latanoprost (XALATAN) 0.005 % ophthalmic solution Place 1 drop into both eyes at bedtime.  11  . losartan (COZAAR) 50 MG tablet Take 1 tablet (50 mg total) by mouth daily. 90 tablet 1  . Melatonin 3 MG TABS Take 1 tablet by mouth at bedtime.    . metoprolol succinate (TOPROL-XL) 50 MG 24 hr tablet Take 1 tablet (50 mg total) by mouth daily. Take with or immediately following a meal. 90 tablet 1  . nystatin (MYCOSTATIN/NYSTOP) powder APPLY TO AFFECTED AREA TWICE A DAY BENEATH BREASTS 30 g 2  . potassium chloride SA (K-DUR,KLOR-CON) 20 MEQ tablet Take 1 tablet (20 mEq total) by mouth daily. 90 tablet 3  . ranitidine (ZANTAC) 150 MG tablet TAKE 1 TABLET (150 MG TOTAL) 2 (TWO) TIMES DAILY BY MOUTH. 180 tablet 1  . torsemide (DEMADEX) 10 MG tablet Take one tablet alternating with 2 tablets every other day 135 tablet 3  . traMADol (ULTRAM) 50 MG tablet Take 1 tablet (50 mg total) by mouth every 8 (eight) hours as needed. 30  tablet 0   No current facility-administered medications for this visit.      Past Medical History:  Diagnosis Date  . Acute systolic congestive heart failure (HCC) 03/01/2014  . Allergy   . Anemia   . Arthritis   . Cardiomyopathy (HCC)   . Cervical myelopathy (HCC)   . CHF (congestive heart failure) (HCC)   . GERD (gastroesophageal reflux disease)   . Heart disease   . Hyperlipidemia   . Hypertension   . Leg pain   . Overactive bladder     Past Surgical History:  Procedure Laterality Date  . ANTERIOR CERVICAL DECOMP/DISCECTOMY FUSION N/A 08/03/2016   Procedure: ANTERIOR CERVICAL DECOMPRESSION/DISCECTOMY FUSION C4-5, C5-6;  Surgeon: Barnett Abu, MD;  Location: [facility] OR;  Service: Neurosurgery;  Laterality: N/A;  . COLONOSCOPY    . KNEE ARTHROSCOPY     2007  . ROTATOR CUFF REPAIR Right 03/23/13  . TOTAL KNEE ARTHROPLASTY  09/22/2011   Procedure: TOTAL KNEE ARTHROPLASTY;  Surgeon: Raymon Mutton, MD;  Location: [facility] OR;  Service: Orthopedics;  Laterality: Right;    Social History   Socioeconomic History  . Marital status: Married    Spouse name: Not on file  . Number of children: 2  . Years of education: HS  . Highest education level: Not on file  Occupational History  . Occupation: [employer]  Employer: [employer]  Social Needs  . Financial resource strain: Not on file  . Food insecurity:    Worry: Not on file    Inability: Not on file  . Transportation needs:    Medical: Not on file    Non-medical: Not on file  Tobacco Use  . Smoking status: Never Smoker  . Smokeless tobacco: Never Used  Substance and Sexual Activity  . Alcohol use: Yes    Alcohol/week: 1.2 oz    Types: 2 Glasses of wine per week  . Drug use: No  . Sexual activity: Not on file  Lifestyle  . Physical activity:    Days per week: Not on file    Minutes per session: Not on file  . Stress: Not on file  Relationships  . Social connections:    Talks on phone: Not on file    Gets together: Not  on file    Attends religious service: Not on file    Active member of club or organization: Not on file    Attends meetings of clubs or organizations: Not on file    Relationship status: Not on file  . Intimate partner violence:    Fear of current or ex partner: Not on file    Emotionally abused: Not on file    Physically abused: Not on file    Forced sexual activity: Not on file  Other Topics Concern  . 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   1-2 cups caffeine daily.   Lives at home with her husband.    Family History  Problem Relation Age of Onset  . Cancer Mother        colon 18 and breast 67- deceased  . Colon cancer Mother   . Diabetes Father        living  . Asthma Father   . Hypertension Father   . Heart attack Father 65       medical management per pt  . Glaucoma Father        had eye transplant  . Kidney failure Father        acute renal failure- died 70  . Esophageal cancer Neg Hx   . Stomach cancer Neg Hx   . Rectal cancer Neg Hx     ROS: no fevers or chills, productive cough, hemoptysis, dysphasia, odynophagia, melena, hematochezia, dysuria, hematuria, rash, seizure activity, orthopnea, PND, pedal edema, claudication. Remaining systems are negative.  Physical Exam: Well-developed obese in no acute distress.  Skin is warm and dry.  HEENT is normal.  Neck is supple.  Chest is clear to auscultation with normal expansion.  Cardiovascular exam is regular rate and rhythm.  Abdominal exam nontender or distended. No masses palpated. Extremities show 1+ edema. neuro grossly intact  ECG-normal sinus rhythm at a rate of 68.  Nonspecific ST changes.  Left ventricular hypertrophy personally  reviewed  A/P  1 cardiomyopathy-patient has mildly reduced LV function.  Continue beta-blocker and ARB.  2 chronic combined systolic/diastolic congestive heart failure-patient is euvolemic on examination.  Plan to continue present dose of diuretic.  We discussed the importance of fluid restriction and low-sodium diet.  Check potassium and  renal function.  3 hypertension-blood pressure is mildly elevated.  Increase Cozaar to 100 mg daily.  Check potassium and renal function in 1 week.  Olga Millers, MD

## 2017-12-02 ENCOUNTER — Encounter: Payer: Self-pay | Admitting: Cardiology

## 2017-12-02 ENCOUNTER — Ambulatory Visit (INDEPENDENT_AMBULATORY_CARE_PROVIDER_SITE_OTHER): Payer: BLUE CROSS/BLUE SHIELD | Admitting: Cardiology

## 2017-12-02 ENCOUNTER — Encounter

## 2017-12-02 VITALS — BP 140/80 | HR 68 | Ht 66.0 in

## 2017-12-02 DIAGNOSIS — I420 Dilated cardiomyopathy: Secondary | ICD-10-CM | POA: Diagnosis not present

## 2017-12-02 DIAGNOSIS — I10 Essential (primary) hypertension: Secondary | ICD-10-CM | POA: Diagnosis not present

## 2017-12-02 DIAGNOSIS — I5042 Chronic combined systolic (congestive) and diastolic (congestive) heart failure: Secondary | ICD-10-CM

## 2017-12-02 MED ORDER — LOSARTAN POTASSIUM 100 MG PO TABS: 100.0000 mg | ORAL_TABLET | Freq: Every day | ORAL | 3 refills | Status: DC

## 2017-12-02 NOTE — Patient Instructions (Signed)
Medication Instructions:   INCREASE LOSARTAN TO 100 MG ONCE DAILY= 2 OF THE 50 MG TABLETS ONCE DAILY  Labwork:  Your physician recommends that you return for lab work in ONE WEEK  NE YEAROFollow-Up:  Your physician wants you to follow-up in: ONE YEAR WITH DR Shelda Pal will receive a reminder letter in the mail two months in advance. If you don't receive a letter, please call our office to schedule the follow-up appointment.   If you need a refill on your cardiac medications before your next appointment, please call your pharmacy.

## 2017-12-03 ENCOUNTER — Encounter (HOSPITAL_COMMUNITY): Payer: BLUE CROSS/BLUE SHIELD

## 2017-12-07 ENCOUNTER — Other Ambulatory Visit: Payer: Self-pay | Admitting: Family

## 2017-12-07 ENCOUNTER — Telehealth: Payer: Self-pay | Admitting: *Deleted

## 2017-12-07 NOTE — Telephone Encounter (Signed)
Received request for Medical Records from [facility]; forwarded to [place] for email/scan/SLS 06/24

## 2017-12-09 ENCOUNTER — Other Ambulatory Visit: Payer: Self-pay | Admitting: Family

## 2017-12-09 ENCOUNTER — Telehealth: Payer: Self-pay | Admitting: *Deleted

## 2017-12-09 NOTE — Telephone Encounter (Signed)
   [facility] Pre-operative Risk Assessment    Request for surgical clearance:  1. What type of surgery is being performed?  LEFT TOTAL KNEE REPLACEMENT   2. When is this surgery scheduled? TBD  3. What type of clearance is required (medical clearance vs. Pharmacy clearance to hold med vs. Both)? MEDICAL  4. Are there any medications that need to be held prior to surgery and how long?N/A  5. Practice name and name of physician performing surgery? [facility]  DR Augustin Coupe  6. What is your office phone number [phone]   7.   What is your office fax number [phone]  8.   Anesthesia type (None, local, MAC, general) ? CHOICE   Devra Dopp 12/09/2017, 4:10 PM  _________________________________________________________________   (provider comments below)

## 2017-12-10 LAB — BASIC METABOLIC PANEL
BUN / CREAT RATIO: 18 (ref 9–23)
BUN: 16 mg/dL (ref 6–24)
CHLORIDE: 106 mmol/L (ref 96–106)
CO2: 24 mmol/L (ref 20–29)
Calcium: 9.7 mg/dL (ref 8.7–10.2)
Creatinine, Ser: 0.89 mg/dL (ref 0.57–1.00)
GFR calc Af Amer: 82 mL/min/{1.73_m2} (ref >59)
GFR calc non Af Amer: 71 mL/min/{1.73_m2} (ref >59)
GLUCOSE: 94 mg/dL (ref 65–99)
POTASSIUM: 5 mmol/L (ref 3.5–5.2)
SODIUM: 144 mmol/L (ref 134–144)

## 2017-12-10 NOTE — Telephone Encounter (Addendum)
   Primary Cardiologist: Olga Millers, MD  Chart reviewed as part of pre-operative protocol coverage. Patient was contacted 12/10/2017 in reference to pre-operative risk assessment for pending surgery as outlined below.  [place] was last seen on 12/02/17 by Dr. Jens Som  H/o NICM, EF 45-50%, HTN, GERD, HTN, HLD, OAB. Low risk nuc 2014. Clearance not specifically outlined in chart but overall appeared to be doing well and f/u 1 year recommended. I called pt to review. She is wheelchair bound related to cervical myelopathy but affirms she has continued to do well since last OV without any new chest pain or dyspnea. She performs ADLs in wheelchair and denies any angina. Not on any blood thinners by our records.  RCRI 0.9% indicating low risk of cardiac complications from surgery. Given past medical history and time since last visit, based on ACC/AHA guidelines, Helana Knable would be at acceptable risk for the planned procedure without further cardiovascular testing.   I will route this recommendation to the requesting party via Epic fax function and remove from pre-op pool.  Please call with questions.  Laurann Montana, PA-C 12/10/2017, 2:15 PM

## 2017-12-11 NOTE — Telephone Encounter (Signed)
Last tramadol RX: 10/26/17, #30 Last OV: 11/23/17 Next OV: 02/26/18 UDS: 07/31/17, moderate risk CSC: 07/31/17 CSR: No discrepancies identified

## 2017-12-18 ENCOUNTER — Ambulatory Visit: Payer: BLUE CROSS/BLUE SHIELD | Admitting: Family

## 2017-12-21 ENCOUNTER — Telehealth: Payer: Self-pay | Admitting: *Deleted

## 2017-12-21 ENCOUNTER — Ambulatory Visit: Payer: BLUE CROSS/BLUE SHIELD | Admitting: Family

## 2017-12-21 ENCOUNTER — Encounter: Payer: Self-pay | Admitting: Family

## 2017-12-21 VITALS — BP 127/60 | HR 74 | Temp 97.9°F | Resp 18 | Ht 66.0 in

## 2017-12-21 DIAGNOSIS — Z01818 Encounter for other preprocedural examination: Secondary | ICD-10-CM

## 2017-12-21 DIAGNOSIS — L304 Erythema intertrigo: Secondary | ICD-10-CM

## 2017-12-21 DIAGNOSIS — N644 Mastodynia: Secondary | ICD-10-CM

## 2017-12-21 LAB — URINALYSIS, ROUTINE W REFLEX MICROSCOPIC
Bilirubin Urine: NEGATIVE
KETONES UR: NEGATIVE
LEUKOCYTES UA: NEGATIVE
Nitrite: NEGATIVE
PH: 6 (ref 5.0–8.0)
Specific Gravity, Urine: 1.015 (ref 1.000–1.030)
TOTAL PROTEIN, URINE-UPE24: NEGATIVE
URINE GLUCOSE: NEGATIVE
UROBILINOGEN UA: 0.2 (ref 0.0–1.0)

## 2017-12-21 LAB — COMPREHENSIVE METABOLIC PANEL
ALBUMIN: 4.2 g/dL (ref 3.5–5.2)
ALK PHOS: 84 U/L (ref 39–117)
ALT: 10 U/L (ref 0–35)
AST: 14 U/L (ref 0–37)
BUN: 19 mg/dL (ref 6–23)
CALCIUM: 9.4 mg/dL (ref 8.4–10.5)
CHLORIDE: 105 meq/L (ref 96–112)
CO2: 30 mEq/L (ref 19–32)
Creatinine, Ser: 0.91 mg/dL (ref 0.40–1.20)
GFR: 81.1 mL/min (ref >60.00)
Glucose, Bld: 85 mg/dL (ref 70–99)
POTASSIUM: 4.2 meq/L (ref 3.5–5.1)
SODIUM: 141 meq/L (ref 135–145)
TOTAL PROTEIN: 7 g/dL (ref 6.0–8.3)
Total Bilirubin: 0.4 mg/dL (ref 0.2–1.2)

## 2017-12-21 LAB — CBC WITH DIFFERENTIAL/PLATELET
BASOS ABS: 0 10*3/uL (ref 0.0–0.1)
Basophils Relative: 0.3 % (ref 0.0–3.0)
EOS ABS: 0.1 10*3/uL (ref 0.0–0.7)
Eosinophils Relative: 3.3 % (ref 0.0–5.0)
HCT: 36.3 % (ref 36.0–46.0)
Hemoglobin: 12.2 g/dL (ref 12.0–15.0)
LYMPHS ABS: 1.4 10*3/uL (ref 0.7–4.0)
LYMPHS PCT: 31.7 % (ref 12.0–46.0)
MCHC: 33.5 g/dL (ref 30.0–36.0)
MCV: 84.3 fl (ref 78.0–100.0)
MONO ABS: 0.7 10*3/uL (ref 0.1–1.0)
Monocytes Relative: 15.9 % — ABNORMAL HIGH (ref 3.0–12.0)
NEUTROS ABS: 2.1 10*3/uL (ref 1.4–7.7)
NEUTROS PCT: 48.8 % (ref 43.0–77.0)
PLATELETS: 274 10*3/uL (ref 150.0–400.0)
RBC: 4.31 Mil/uL (ref 3.87–5.11)
RDW: 14.3 % (ref 11.5–15.5)
WBC: 4.4 10*3/uL (ref 4.0–10.5)

## 2017-12-21 LAB — PROTIME-INR
INR: 1 ratio (ref 0.8–1.0)
PROTHROMBIN TIME: 12.2 s (ref 9.6–13.1)

## 2017-12-21 MED ORDER — FLUCONAZOLE 150 MG PO TABS: ORAL_TABLET | ORAL | 0 refills | Status: DC

## 2017-12-21 NOTE — Telephone Encounter (Signed)
Pt in office today and requested note to remain out of work through the 17th. Note is ready to fax. Left detailed message on pt's voicemail to call and confirm if Burke Keels at [phone] is the correct # to fax it to?  Ok for [facility] / Triage to discuss with pt.

## 2017-12-21 NOTE — Progress Notes (Signed)
Subjective:    Patient ID: [employer]ana [employer], female    DOB: 11/16/1955, 62 y.o.   MRN: [id number]  HPI  B[employer]ana [employer] is a 62 yr old female who presents today with several concerns:  Skin rash- between breasts, reports that it improved with diflucan then returned.   Mastalgia- left breast, intermittent. Had Mammogram in April and reports discomfort was there even in May. "like a pulling" in the breast.  L Knee pain- needs surgical clearance for L TKR.  Reports ongoing pain in the left knee.    Review of Systems See HPI  Past Medical History:  Diagnosis Date  . Acute systolic congestive heart failure (HCC) 03/01/2014  . Allergy   . Anemia   . Arthritis   . Cardiomyopathy (HCC)   . Cervical myelopathy (HCC)   . CHF (congestive heart failure) (HCC)   . GERD (gastroesophageal reflux disease)   . Heart disease   . Hyperlipidemia   . Hypertension   . Leg pain   . Overactive bladder      Social History   Socioeconomic History  . Marital status: Married    Spouse name: Not on file  . Number of children: 2  . Years of education: HS  . Highest education level: Not on file  Occupational History  . Occupation: Acupuncturist: [employer]  Social Needs  . Financial resource strain: Not on file  . Food insecurity:    Worry: Not on file    Inability: Not on file  . Transportation needs:    Medical: Not on file    Non-medical: Not on file  Tobacco Use  . Smoking status: Never Smoker  . Smokeless tobacco: Never Used  Substance and Sexual Activity  . Alcohol use: Yes    Alcohol/week: 1.2 oz    Types: 2 Glasses of wine per week  . Drug use: No  . Sexual activity: Not on file  Lifestyle  . Physical activity:    Days per week: Not on file    Minutes per session: Not on file  . Stress: Not on file  Relationships  . Social connections:    Talks on phone: Not on file    Gets together: Not on file    Attends religious service: Not on file    Active member of  club or organization: Not on file    Attends meetings of clubs or organizations: Not on file    Relationship status: Not on file  . Intimate partner violence:    Fear of current or ex partner: Not on file    Emotionally abused: Not on file    Physically abused: Not on file    Forced sexual activity: Not on file  Other Topics Concern  . 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   1-2 cups caffeine daily.   Lives at home with her husband.    Past Surgical History:  Procedure Laterality Date  . ANTERIOR CERVICAL DECOMP/DISCECTOMY FUSION N/A 08/03/2016   Procedure: ANTERIOR CERVICAL DECOMPRESSION/DISCECTOMY FUSION C4-5, C5-6;  Surgeon: Barnett Abu, MD;  Location: [facility] OR;  Service: Neurosurgery;  Laterality: N/A;  . COLONOSCOPY    .  KNEE ARTHROSCOPY     2007  . ROTATOR CUFF REPAIR Right 03/23/13  . TOTAL KNEE ARTHROPLASTY  09/22/2011   Procedure: TOTAL KNEE ARTHROPLASTY;  Surgeon: Raymon Mutton, MD;  Location: [facility] OR;  Service: Orthopedics;  Laterality: Right;    Family History  Problem Relation Age of Onset  . Cancer Mother        colon 38 and breast 46- deceased  . Colon cancer Mother   . Diabetes Father        living  . Asthma Father   . Hypertension Father   . Heart attack Father 42       medical management per pt  . Glaucoma Father        had eye transplant  . Kidney failure Father        acute renal failure- died 70  . Esophageal cancer Neg Hx   . Stomach cancer Neg Hx   . Rectal cancer Neg Hx     No Known Allergies  Current Outpatient Medications on File Prior to Visit  Medication Sig Dispense Refill  . calcium-vitamin D (OSCAL WITH D) 500-200 MG-UNIT per tablet Take 1 tablet by mouth daily.    .  celecoxib (CELEBREX) 100 MG capsule TAKE 1 CAPSULE (100 MG TOTAL) DAILY BY MOUTH. 30 capsule 3  . gabapentin (NEURONTIN) 300 MG capsule TAKE 4 CAPSULES 3 TIMES DAILY BY MOUTH. 180 capsule 5  . latanoprost (XALATAN) 0.005 % ophthalmic solution Place 1 drop into both eyes at bedtime.  11  . losartan (COZAAR) 100 MG tablet Take 1 tablet (100 mg total) by mouth daily. 90 tablet 3  . Melatonin 3 MG TABS Take 1 tablet by mouth at bedtime.    . metoprolol succinate (TOPROL-XL) 50 MG 24 hr tablet Take 1 tablet (50 mg total) by mouth daily. Take with or immediately following a meal. 90 tablet 1  . nystatin (MYCOSTATIN/NYSTOP) powder APPLY TO AFFECTED AREA TWICE A DAY BENEATH BREASTS 30 g 5  . potassium chloride SA (K-DUR,KLOR-CON) 20 MEQ tablet Take 1 tablet (20 mEq total) by mouth daily. 90 tablet 3  . ranitidine (ZANTAC) 150 MG tablet TAKE 1 TABLET (150 MG TOTAL) 2 (TWO) TIMES DAILY BY MOUTH. 180 tablet 1  . torsemide (DEMADEX) 10 MG tablet Take one tablet alternating with 2 tablets every other day 135 tablet 3  . traMADol (ULTRAM) 50 MG tablet TAKE 1 TABLET (50 MG TOTAL) BY MOUTH EVERY 8 (EIGHT) HOURS AS NEEDED. 30 tablet 0   No current facility-administered medications on file prior to visit.     BP 127/60 (BP Location: Left Arm, Cuff Size: Large)   Pulse 74   Temp 97.9 F (36.6 C) (Oral)   Resp 18   Ht 5\' 6"  (1.676 m)   LMP 06/17/2007   SpO2 100%   BMI 41.16 kg/m       Objective:   Physical Exam  Constitutional: She is oriented to person, place, and time. She appears well-developed and well-nourished.  Cardiovascular: Normal rate, regular rhythm and normal heart sounds.  No murmur heard. Pulmonary/Chest: Effort normal and breath sounds normal. No respiratory distress. She has no wheezes.  Musculoskeletal:  2+ bilateral LE edema  Neurological: She is alert and oriented to person, place, and time.  Psychiatric: She has a normal mood and affect. Her behavior is normal. Judgment and  thought content normal.  skin-hyperpigmented rash beneath both breasts.  Breast: Left breast normal without masses, asymmetry  Assessment & Plan:  Pre-operative evaluation- obtain CMET, CBC, UA/Culture, cxr.  Will be evaluated by cardiology for cardiac clearance.  Medical clearance pending review of above studies.  Intertrigo- repeat diflucan, continue bid nystatin powder. Advised pt to blow dry beneath breasts after shower.    Mastalgia- normal exam, normal recent mammogram. Continue to monitor.

## 2017-12-21 NOTE — Patient Instructions (Signed)
Please complete lab work prior to leaving.  Complete chest x-ray on the first floor.  

## 2017-12-22 LAB — URINE CULTURE
MICRO NUMBER:: 90805002
SPECIMEN QUALITY:: ADEQUATE

## 2017-12-22 NOTE — Telephone Encounter (Signed)
Noted faxed to below #.   Briana Miller (Patient) Briana Miller (Patient) General - Other  Reason for CRM: Pt states she received a voicemail message and the name and number that was left is the correct information where the fax should be sent. Cb# [phone]

## 2017-12-25 ENCOUNTER — Telehealth: Payer: Self-pay

## 2017-12-25 NOTE — Telephone Encounter (Signed)
eceived call from [facility]. States patient is there and needsPreferred Imaging location changed to them as well as an order. Changed location and faxed order for patient to [facility].

## 2017-12-25 NOTE — Telephone Encounter (Signed)
Briana Miller, per note, already faxed to [facility] at 1251PM.  Copied from CRM [id number]. Topic: General - Other >> Dec 25, 2017 11:59 AM [name] wrote: Reason for CRM: Requesting order for chest xray to be faxed to [facility], Fax # [phone]. Patient on the way there now

## 2017-12-28 ENCOUNTER — Encounter: Payer: Self-pay | Admitting: Family

## 2017-12-29 ENCOUNTER — Telehealth: Payer: Self-pay | Admitting: *Deleted

## 2017-12-29 DIAGNOSIS — Z01818 Encounter for other preprocedural examination: Secondary | ICD-10-CM

## 2017-12-29 NOTE — Telephone Encounter (Signed)
Copied from CRM [id number]. Topic: General - Other >> Dec 25, 2017  7:09 AM Gerrianne Scale wrote: Reason for CRM: pt states that she would like for her order of the chest xray to be sent to the [facility] (F) [phone] due to insurance

## 2017-12-29 NOTE — Telephone Encounter (Signed)
Received results from [facility] XR Chest PA and Latera & US Soft Tissue Extremityl; forwarded to provider/SLS 07/16

## 2017-12-29 NOTE — Telephone Encounter (Signed)
Xray has been re-ordered and faxed to [facility] at below #. Notified pt. She states she already completed the xray. Attempted to reach [facility] in [place] at [phone] and call went to voicemail. Will try again later to request result.

## 2017-12-31 NOTE — Telephone Encounter (Signed)
Result viewed by PCP and clearance was faxed to Dr Sherlean Foot at [phone].

## 2018-01-16 ENCOUNTER — Other Ambulatory Visit: Payer: Self-pay | Admitting: Family

## 2018-01-18 ENCOUNTER — Other Ambulatory Visit: Payer: Self-pay | Admitting: Family

## 2018-01-18 DIAGNOSIS — M79606 Pain in leg, unspecified: Secondary | ICD-10-CM

## 2018-01-18 NOTE — Telephone Encounter (Signed)
DUPLICATE REQUEST

## 2018-01-18 NOTE — Telephone Encounter (Signed)
Last tramadol RX: 12/12/17, #30 Last OV:  12/21/17 Next OV: not scheduled UDS: 07/31/17, moderate risk CSC: 07/31/17 CSR: No discrepancies identified

## 2018-01-19 MED ORDER — FLUCONAZOLE 150 MG PO TABS: ORAL_TABLET | ORAL | 0 refills | Status: DC

## 2018-01-19 MED ORDER — TRAMADOL HCL 50 MG PO TABS: 50.0000 mg | ORAL_TABLET | Freq: Three times a day (TID) | ORAL | 0 refills | Status: DC | PRN

## 2018-01-19 NOTE — Telephone Encounter (Signed)
Requesting: tramadol 50mg  q 8hr prn Contract: 2019 UDS: 07/31/17 Last OV: 12/21/17 Next Ov: 02/26/18 Last refill: 12/12/17, #30, 0RF Database: no recent discrepancies  Please advise.   Pt. Also requesting refill of diflucan, last prescribed 12/21/17 X 2 doses. Orders left pended for [name], NP  Review.

## 2018-01-27 ENCOUNTER — Other Ambulatory Visit: Payer: Self-pay | Admitting: Family

## 2018-01-27 NOTE — Telephone Encounter (Signed)
Briana Miller -- please review gabapentin request / quantity. Directions say 4 capsules three times daily but quantity is only 180. 30 day supply would be #360. Ok to change quantity?

## 2018-01-27 NOTE — Telephone Encounter (Signed)
Could you please contact pt and ask how she has been taking it?

## 2018-01-29 NOTE — Telephone Encounter (Signed)
Per patient she is taking 12 Gabapentins per day

## 2018-02-23 ENCOUNTER — Other Ambulatory Visit: Payer: Self-pay | Admitting: Family

## 2018-02-23 NOTE — Telephone Encounter (Signed)
[name] -- please see pharmacy request for 90 day supply of pt's gabapentin and advise? Pharmacy is requesting #1080 capsules for 90 days?

## 2018-02-25 NOTE — Telephone Encounter (Signed)
I sent 90 day supply but please let pt know that I sent rx for 3 caps TID instead of 4 caps TID. I think this is a better dose for her.

## 2018-02-26 ENCOUNTER — Ambulatory Visit: Payer: BLUE CROSS/BLUE SHIELD | Admitting: Family

## 2018-03-01 NOTE — Telephone Encounter (Signed)
Attempted to reach pt and left detailed message on voicemail re: below RX direction change and to call if further questions.

## 2018-03-29 ENCOUNTER — Other Ambulatory Visit: Payer: Self-pay | Admitting: Family

## 2018-03-30 ENCOUNTER — Other Ambulatory Visit: Payer: Self-pay | Admitting: Family

## 2018-03-30 MED ORDER — TRAMADOL HCL 50 MG PO TABS: 50.0000 mg | ORAL_TABLET | Freq: Three times a day (TID) | ORAL | 0 refills | Status: DC | PRN

## 2018-03-30 NOTE — Telephone Encounter (Signed)
Pt is requesting refill on tramadol.   Last OV: 12/21/2017 Last Fill: 01/19/2018 #30 and 0RF UDS: 07/31/2017

## 2018-04-08 ENCOUNTER — Other Ambulatory Visit: Payer: Self-pay | Admitting: Family

## 2018-04-09 MED ORDER — FLUCONAZOLE 150 MG PO TABS: ORAL_TABLET | ORAL | 0 refills | Status: DC

## 2018-04-11 ENCOUNTER — Encounter: Payer: Self-pay | Admitting: Family

## 2018-05-07 ENCOUNTER — Other Ambulatory Visit: Payer: Self-pay | Admitting: Family

## 2018-05-07 NOTE — Telephone Encounter (Signed)
Received refill request for losartan 50mg  from [facility]. Per current med list, pt should be taking 100mg  once a day and last rx was provided 12/02/17, #90 x 3 refills from cardiology. Denial sent to pharmacy. Left detailed message on pt's voicemail to contact cardiology if she has any questions about refill or dose that she should be taking as our records indicate she is on 100mg .

## 2018-05-11 ENCOUNTER — Other Ambulatory Visit: Payer: Self-pay | Admitting: Family

## 2018-05-11 NOTE — Telephone Encounter (Signed)
Please review refill request.  Patient is requesting a 90 day supply of the Gabapentin.//AB/CMA

## 2018-05-29 ENCOUNTER — Other Ambulatory Visit: Payer: Self-pay | Admitting: Family

## 2018-05-31 MED ORDER — GABAPENTIN 300 MG PO CAPS: 900.0000 mg | ORAL_CAPSULE | Freq: Three times a day (TID) | ORAL | 1 refills | Status: DC

## 2018-05-31 NOTE — Telephone Encounter (Signed)
Briana Miller -- Pharmacy is requesting 90 day supply of gabapentin for pt. Her current 30 day supply is #270. Please advise?

## 2018-05-31 NOTE — Addendum Note (Signed)
Addended by: Sandford Craze on: 05/31/2018 06:26 PM   Modules accepted: Orders

## 2018-06-06 ENCOUNTER — Encounter: Payer: Self-pay | Admitting: Gastroenterology

## 2018-06-20 IMAGING — CR DG KNEE COMPLETE 4+V*R*
4 series · 4 of 4 positions shown · non-contrast
Comparison: None.

CLINICAL DATA: Several falls over past month, fall x 3 today,
bilateral knee pain- right knee is worse, hx right knee replacement
x 4 yrs ago.

EXAM:
RIGHT KNEE - COMPLETE 4+ VIEW

[knee ap]
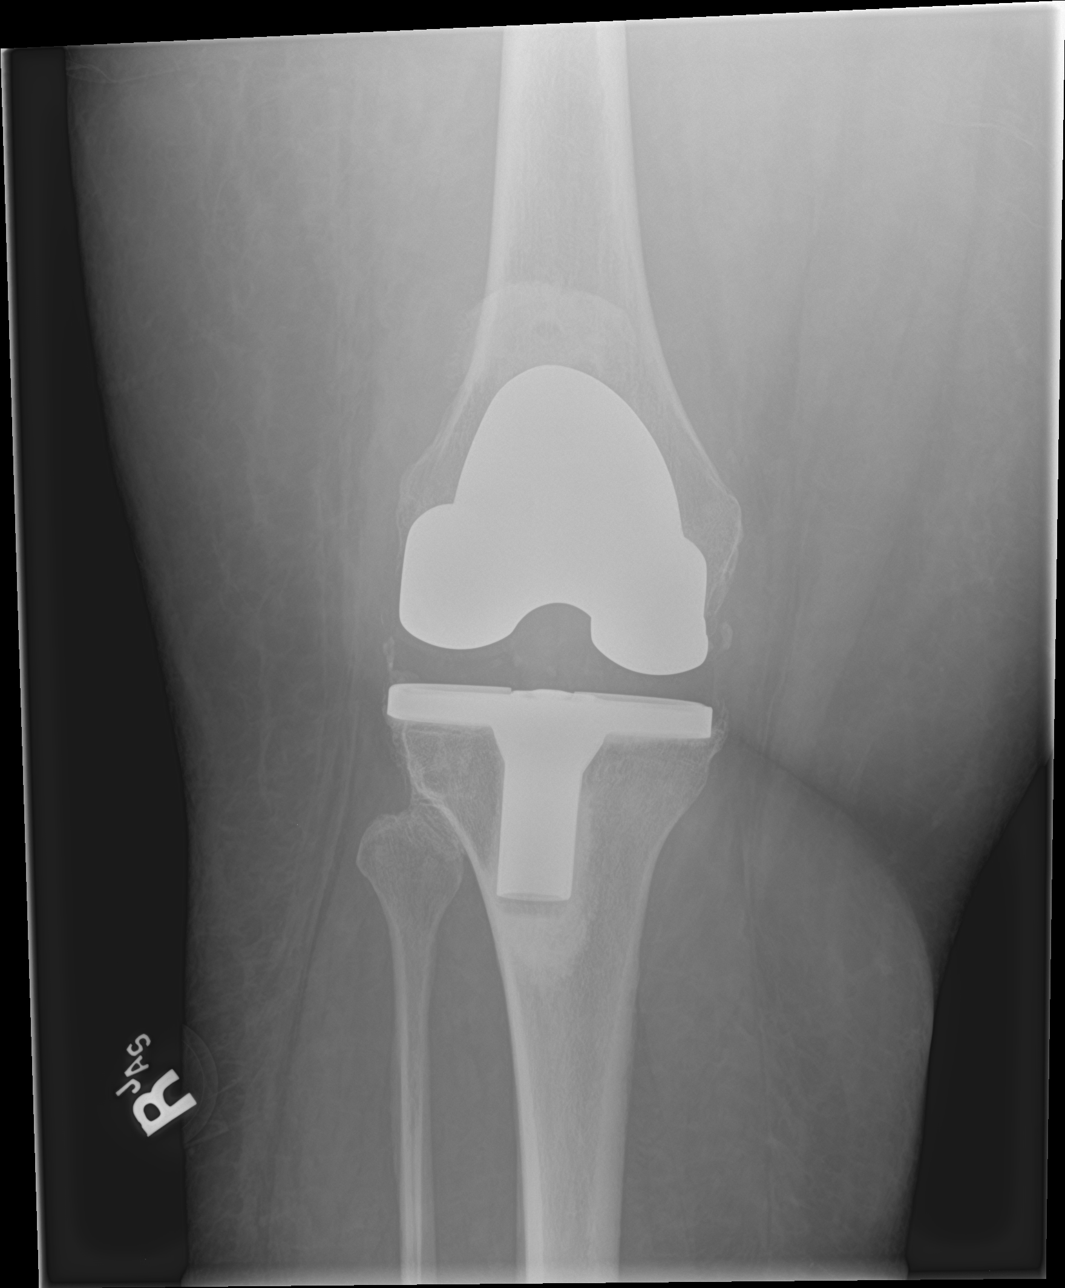

[knee lat]
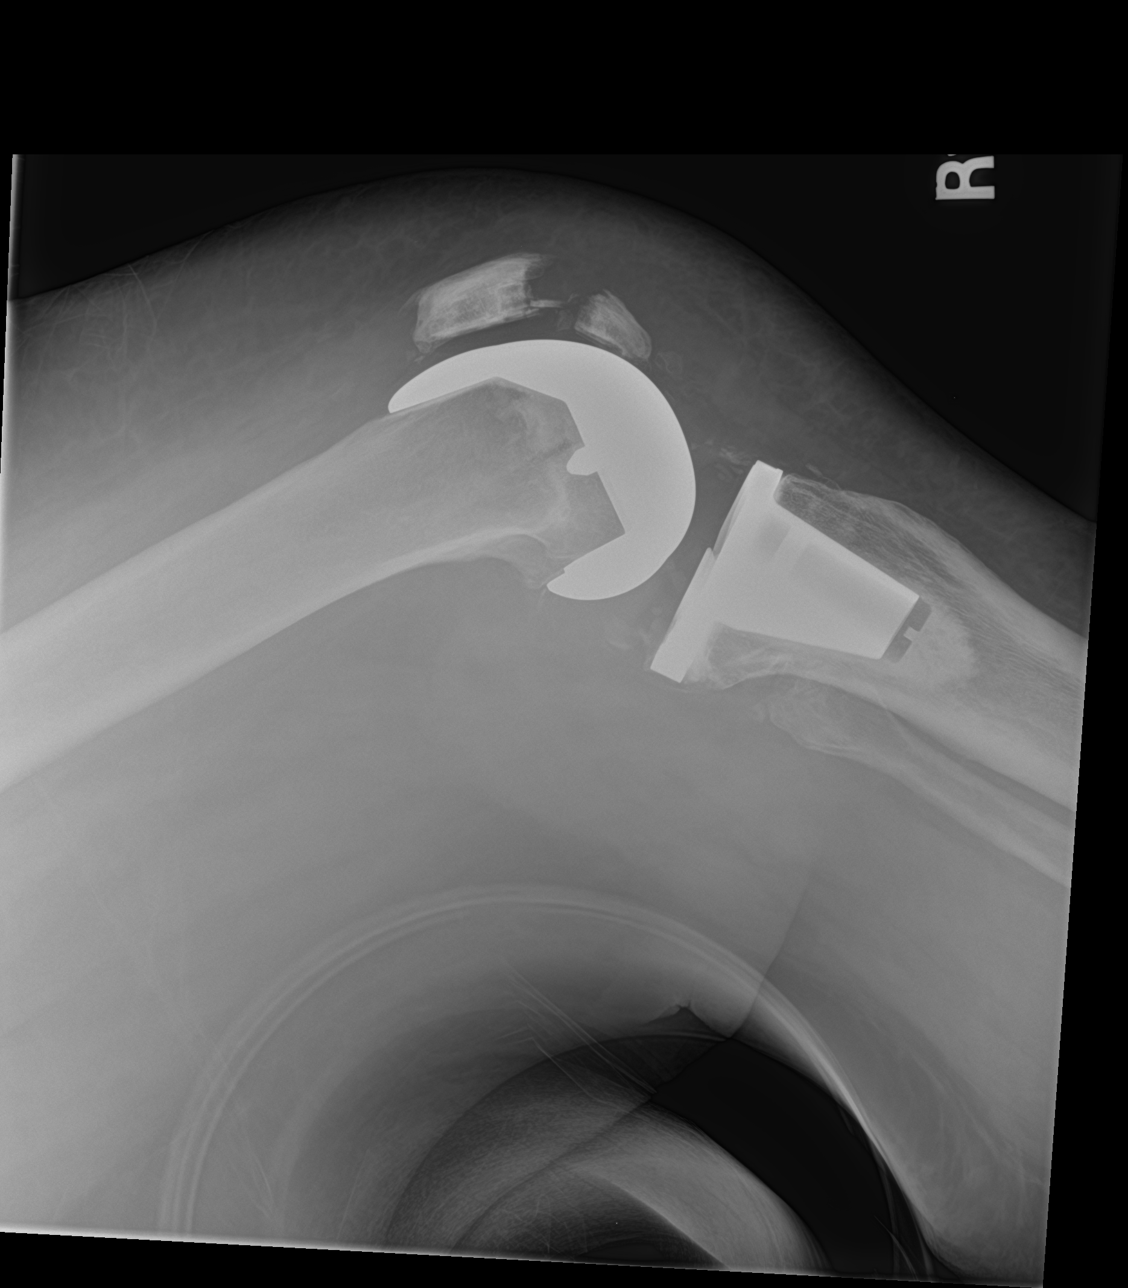

[knee obl (1 of 2)]
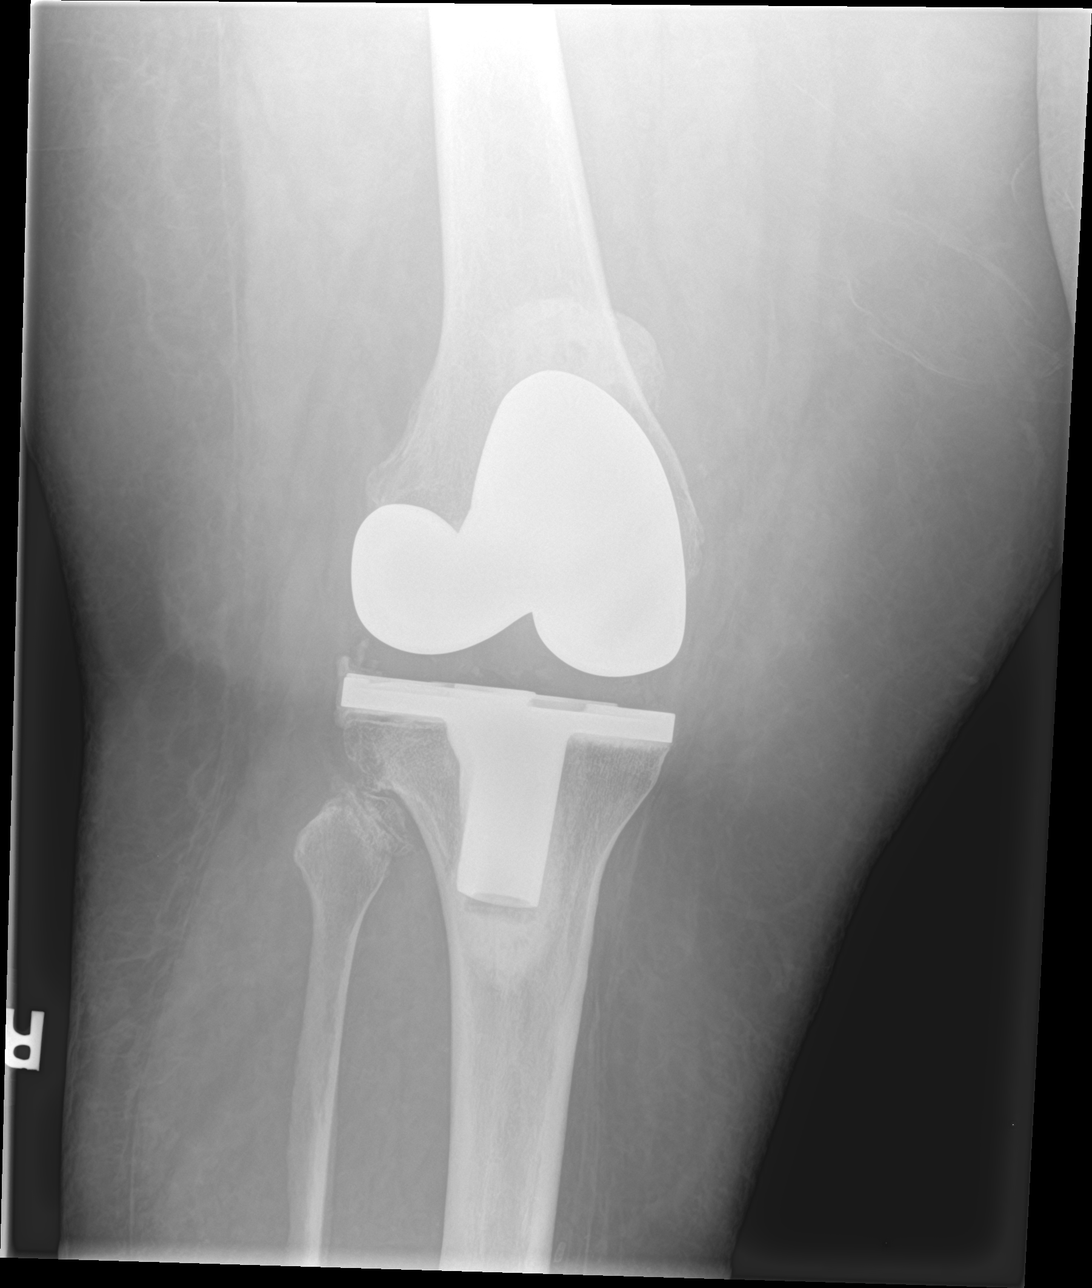

[knee obl (2 of 2)]
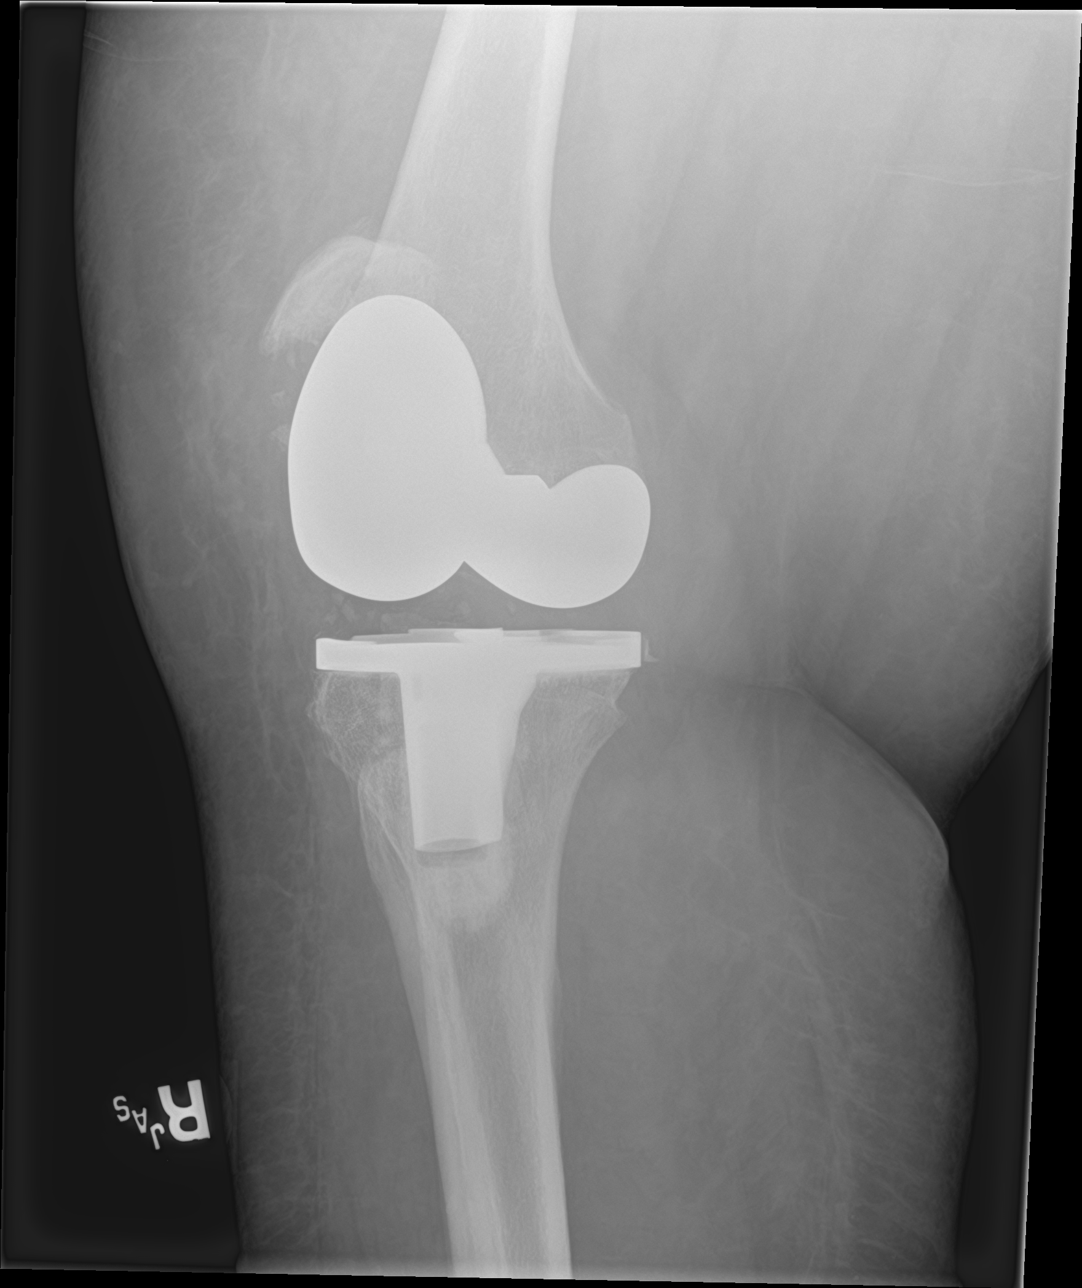

[4 of 4 positions shown; findings below may reference images not displayed]

FINDINGS: There is an acute fracture of the patella. Fractures transverse
across the mid patella. The superior inferior components are
displaced by 15 mm. The there are a few small comminuted fracture
components between the displaced major fracture fragments.

No other fractures.

The femoral and tibial prosthetic components are well-seated and
aligned.

Bones are demineralized.

There is anterior soft tissue edema.
IMPRESSION: 1. Fractured patella, with fracture components displaced 15 mm.

## 2018-07-16 ENCOUNTER — Ambulatory Visit (INDEPENDENT_AMBULATORY_CARE_PROVIDER_SITE_OTHER): Payer: Medicare Other | Admitting: Family

## 2018-07-16 ENCOUNTER — Encounter: Payer: Self-pay | Admitting: Family

## 2018-07-16 VITALS — BP 118/69 | HR 78 | Temp 98.9°F | Resp 18 | Ht 66.0 in

## 2018-07-16 DIAGNOSIS — M79605 Pain in left leg: Secondary | ICD-10-CM

## 2018-07-16 DIAGNOSIS — M79602 Pain in left arm: Secondary | ICD-10-CM | POA: Diagnosis not present

## 2018-07-16 DIAGNOSIS — M7989 Other specified soft tissue disorders: Secondary | ICD-10-CM

## 2018-07-16 DIAGNOSIS — R0789 Other chest pain: Secondary | ICD-10-CM | POA: Diagnosis not present

## 2018-07-16 DIAGNOSIS — K219 Gastro-esophageal reflux disease without esophagitis: Secondary | ICD-10-CM | POA: Diagnosis not present

## 2018-07-16 MED ORDER — OMEPRAZOLE 40 MG PO CPDR: 40.0000 mg | DELAYED_RELEASE_CAPSULE | Freq: Every day | ORAL | 5 refills | Status: DC

## 2018-07-16 NOTE — Patient Instructions (Addendum)
You should be contacted about your referral to cardiology. Please complete ultrasound at [facility] on [address].   Please go to the ER if you develop recurrent chest pain. Stop zantac, start omeprazole.

## 2018-07-16 NOTE — Progress Notes (Signed)
Subjective:    Patient ID: Briana Miller, female    DOB: Jun 26, 1955, 63 y.o.   MRN: [id number]  HPI  Patient is a 63 yr old female who presents today with chief complaint of left leg pain. Pain has been present x 2-3 weeks.  Pain in the left leg is in thelower leg   Also reports + left arm pain.  Reports that pain is "grabbing" and intermittent.  Has associated left arm numbness.  She has had an episode of chest pain associated with the left arm pain. She reports that her last episode of chest pain was this past Monday.  Denies slurred speech or facial drooping.   GERD- reports uncontrolled despite ranitidine   Review of Systems See HPI  Past Medical History:  Diagnosis Date  . Acute systolic congestive heart failure (HCC) 03/01/2014  . Allergy   . Anemia   . Arthritis   . Cardiomyopathy (HCC)   . Cervical myelopathy (HCC)   . CHF (congestive heart failure) (HCC)   . GERD (gastroesophageal reflux disease)   . Heart disease   . Hyperlipidemia   . Hypertension   . Leg pain   . Overactive bladder      Social History   Socioeconomic History  . Marital status: Married    Spouse name: Not on file  . Number of children: 2  . Years of education: HS  . Highest education level: Not on file  Occupational History  . Occupation: Acupuncturist: [employer]  Social Needs  . Financial resource strain: Not on file  . Food insecurity:    Worry: Not on file    Inability: Not on file  . Transportation needs:    Medical: Not on file    Non-medical: Not on file  Tobacco Use  . Smoking status: Never Smoker  . Smokeless tobacco: Never Used  Substance and Sexual Activity  . Alcohol use: Yes    Alcohol/week: 2.0 standard drinks    Types: 2 Glasses of wine per week  . Drug use: No  . Sexual activity: Not on file  Lifestyle  . Physical activity:    Days per week: Not on file    Minutes per session: Not on file  . Stress: Not on file  Relationships  . Social  connections:    Talks on phone: Not on file    Gets together: Not on file    Attends religious service: Not on file    Active member of club or organization: Not on file    Attends meetings of clubs or organizations: Not on file    Relationship status: Not on file  . Intimate partner violence:    Fear of current or ex partner: Not on file    Emotionally abused: Not on file    Physically abused: Not on file    Forced sexual activity: Not on file  Other Topics Concern  . 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   1-2 cups caffeine daily.   Lives at home with her husband.    Past Surgical History:  Procedure Laterality Date  . ANTERIOR CERVICAL DECOMP/DISCECTOMY FUSION N/A 08/03/2016  Procedure: ANTERIOR CERVICAL DECOMPRESSION/DISCECTOMY FUSION C4-5, C5-6;  Surgeon: Barnett Abu, MD;  Location: [facility] OR;  Service: Neurosurgery;  Laterality: N/A;  . COLONOSCOPY    . KNEE ARTHROSCOPY     2007  . ROTATOR CUFF REPAIR Right 03/23/13  . TOTAL KNEE ARTHROPLASTY  09/22/2011   Procedure: TOTAL KNEE ARTHROPLASTY;  Surgeon: Raymon Mutton, MD;  Location: [facility] OR;  Service: Orthopedics;  Laterality: Right;    Family History  Problem Relation Age of Onset  . Cancer Mother        colon 70 and breast 53- deceased  . Colon cancer Mother   . Diabetes Father        living  . Asthma Father   . Hypertension Father   . Heart attack Father 36       medical management per pt  . Glaucoma Father        had eye transplant  . Kidney failure Father        acute renal failure- died 31  . Esophageal cancer Neg Hx   . Stomach cancer Neg Hx   . Rectal cancer Neg Hx     No Known Allergies  Current Outpatient Medications on File Prior to  Visit  Medication Sig Dispense Refill  . calcium-vitamin D (OSCAL WITH D) 500-200 MG-UNIT per tablet Take 1 tablet by mouth daily.    . celecoxib (CELEBREX) 100 MG capsule TAKE 1 CAPSULE (100 MG TOTAL) DAILY BY MOUTH. 30 capsule 3  . gabapentin (NEURONTIN) 300 MG capsule Take 3 capsules (900 mg total) by mouth 3 (three) times daily. 810 capsule 1  . latanoprost (XALATAN) 0.005 % ophthalmic solution Place 1 drop into both eyes at bedtime.  11  . losartan (COZAAR) 100 MG tablet Take 1 tablet (100 mg total) by mouth daily. 90 tablet 3  . Melatonin 3 MG TABS Take 1 tablet by mouth at bedtime.    . metoprolol succinate (TOPROL-XL) 50 MG 24 hr tablet Take 1 tablet (50 mg total) by mouth daily. Take with or immediately following a meal. 90 tablet 1  . nystatin (MYCOSTATIN/NYSTOP) powder APPLY TO AFFECTED AREA TWICE A DAY BENEATH BREASTS 30 g 5  . potassium chloride SA (K-DUR,KLOR-CON) 20 MEQ tablet Take 1 tablet (20 mEq total) by mouth daily. 90 tablet 3  . ranitidine (ZANTAC) 150 MG tablet TAKE 1 TABLET (150 MG TOTAL) 2 (TWO) TIMES DAILY BY MOUTH. 180 tablet 1  . torsemide (DEMADEX) 10 MG tablet Take one tablet alternating with 2 tablets every other day 135 tablet 3  . traMADol (ULTRAM) 50 MG tablet Take 1 tablet (50 mg total) by mouth every 8 (eight) hours as needed. 30 tablet 0  . fluconazole (DIFLUCAN) 150 MG tablet 1 tab by mouth today and again in 1 week. (Patient not taking: Reported on 07/16/2018) 2 tablet 0   No current facility-administered medications on file prior to visit.     BP 118/69 (BP Location: Right Arm, Patient Position: Sitting, Cuff Size: Large)   Pulse 78   Temp 98.9 F (37.2 C) (Oral)   Resp 18   Ht 5\' 6"  (1.676 m)   LMP 06/17/2007   SpO2 98%   BMI 41.16 kg/m       Objective:   Physical Exam Constitutional:      Appearance: She is well-developed.  Neck:     Musculoskeletal: Neck supple.     Thyroid: No thyromegaly.  Cardiovascular:     Rate and Rhythm: Normal  rate  and regular rhythm.     Heart sounds: Normal heart sounds. No murmur.  Pulmonary:     Effort: Pulmonary effort is normal. No respiratory distress.     Breath sounds: Normal breath sounds. No wheezing.  Musculoskeletal:     Comments: 2-3+ bilateral LE edema Tender area noted left lower leg medially wth some induration/hyperpigmentation.  No erythema.   Skin:    General: Skin is warm and dry.  Neurological:     Mental Status: She is alert and oriented to person, place, and time.     Comments: Seated in wheelchair. Bilateral UE is 4/5 Bilateral LE is 4/5   Psychiatric:        Behavior: Behavior normal.        Thought Content: Thought content normal.        Judgment: Judgment normal.           Assessment & Plan:  GERD- uncontrolled. D/c zantac, instead start protonix.  L arm pain- suspect cervical radiculopathy given her hx of cervical disease. Will arrange follow up with her neurosurgeon.  LLE pain- obtain LE doppler to rule out DVT given her immobility status.  Lumbar radiculopathy is likely contributing to her leg pain.   Atypical chest pain-  EKG today.  EKG is personally reviewed-notes new incomplete LBBB when compared to old EKG. Will refer to cardiology for further evaluation. Pt is advised to go to the ER if she develops recurrent chest pain. She verbalizes understanding.

## 2018-07-19 ENCOUNTER — Telehealth: Payer: Self-pay

## 2018-07-19 ENCOUNTER — Ambulatory Visit: Payer: Medicare Other | Admitting: Cardiology

## 2018-07-19 NOTE — Telephone Encounter (Signed)
We received a call earlier from referrals department for [place] to be aware patient did not have stat doppler US ordered on Friday and it looks like she is scheduled for this Friday the 7th. Lm for patient to be aware it is important that she

## 2018-07-19 NOTE — Telephone Encounter (Signed)
Lm for her to be aware it is very important to complete this test asap. Advised her to have US done today if possible.

## 2018-07-21 NOTE — Telephone Encounter (Signed)
Lm again for patient to call us back about the [place] ordered stat last week.

## 2018-07-26 ENCOUNTER — Telehealth: Payer: Self-pay | Admitting: Family

## 2018-07-26 NOTE — Telephone Encounter (Signed)
Patient had [place] at [facility] Friday, report received and placed on provider's folder for review.

## 2018-07-26 NOTE — Telephone Encounter (Signed)
Patient advised per Briana Miller [place] report was negative for dvt and she needs to follow up with her neurosurgeon about the leg pain.

## 2018-07-26 NOTE — Telephone Encounter (Signed)
Copied from CRM [id number]. Topic: Quick Communication - See Telephone Encounter >> Jul 26, 2018  5:05 PM Jens Som A wrote: CRM for notification. See Telephone encounter for: 07/26/18.  Patient is calling because she is wanting to know what the next steps are after her ultrasound on her left leg. Does she see another dr? Or can she get some pain medication. Please advise [phone]

## 2018-08-04 ENCOUNTER — Telehealth: Payer: Self-pay

## 2018-08-04 NOTE — Telephone Encounter (Signed)
Copied from CRM [id number]. Topic: Referral - Request for Referral >> Aug 04, 2018  3:00 PM Wyonia Hough E wrote: Has patient seen PCP for this complaint? Yes *If NO, is insurance requiring patient see PCP for this issue before PCP can refer them? Referral for which specialty: cardiology Preferred provider/office: [facility]  Reason for referral: other referral provider cannot se PT until May/ Pt would like a referral to be seen sooner

## 2018-08-05 NOTE — Telephone Encounter (Signed)
Referral

## 2018-08-05 NOTE — Telephone Encounter (Signed)
Can you please send this cardiology referral to [facility] per patient's request.

## 2018-08-09 NOTE — Telephone Encounter (Signed)
Patient is requesting a call back in regards to be seen sooner at [facility] cardiology. Please advise

## 2018-08-11 NOTE — Telephone Encounter (Signed)
Per patient she will like to be referred to another cardiologist due to next available with the group she was referred to is 10/17/2018. She does not have any preferences.

## 2018-08-12 NOTE — Telephone Encounter (Signed)
Pt has appt on 3/11 with Dr. Jens Som at [facility]

## 2018-08-12 NOTE — Telephone Encounter (Signed)
Lm for patient to be aware of appointment date, time and location.

## 2018-08-18 NOTE — Progress Notes (Signed)
HPI: FU cardiomyopathy. Echocardiogram in October of 2014 showed an ejection fraction of 40-45%, mild left ventricular hypertrophy and mild left atrial enlargement. Nuclear study in December of 2014 showed an ejection fraction of 51% and normal perfusion.Last echo 2/18 showed EF 45-50, grade 1 DD, mild MR and TR.Venous Dopplers July 2019 showed subtle focus in the subcutaneous tissues of the medial calf on the left.  Surgical evaluation versus MRI suggested.  Since last seenshe has some dyspnea.  Occasional sharp pain in her left shoulder but no chest pain.  She has tingling in both of her lower extremities and a burning pain in her left calf area.  This is persistent.  Current Outpatient Medications  Medication Sig Dispense Refill  . calcium-vitamin D (OSCAL WITH D) 500-200 MG-UNIT per tablet Take 1 tablet by mouth daily.    Marland Kitchen gabapentin (NEURONTIN) 300 MG capsule Take 3 capsules (900 mg total) by mouth 3 (three) times daily. 810 capsule 1  . latanoprost (XALATAN) 0.005 % ophthalmic solution Place 1 drop into both eyes at bedtime.  11  . losartan (COZAAR) 100 MG tablet Take 1 tablet (100 mg total) by mouth daily. 90 tablet 3  . Melatonin 3 MG TABS Take 1 tablet by mouth at bedtime.    . metoprolol succinate (TOPROL-XL) 50 MG 24 hr tablet Take 1 tablet (50 mg total) by mouth daily. Take with or immediately following a meal. 90 tablet 1  . nystatin (MYCOSTATIN/NYSTOP) powder APPLY TO AFFECTED AREA TWICE A DAY BENEATH BREASTS 30 g 5  . omeprazole (PRILOSEC) 40 MG capsule Take 1 capsule (40 mg total) by mouth daily. 30 capsule 5  . potassium chloride SA (K-DUR,KLOR-CON) 20 MEQ tablet Take 1 tablet (20 mEq total) by mouth daily. 90 tablet 3  . torsemide (DEMADEX) 10 MG tablet Take one tablet alternating with 2 tablets every other day 135 tablet 3  . traMADol (ULTRAM) 50 MG tablet Take 1 tablet (50 mg total) by mouth every 8 (eight) hours as needed. 30 tablet 0   No current  facility-administered medications for this visit.      Past Medical History:  Diagnosis Date  . Acute systolic congestive heart failure (HCC) 03/01/2014  . Allergy   . Anemia   . Arthritis   . Cardiomyopathy (HCC)   . Cervical myelopathy (HCC)   . CHF (congestive heart failure) (HCC)   . GERD (gastroesophageal reflux disease)   . Heart disease   . Hyperlipidemia   . Hypertension   . Leg pain   . Overactive bladder     Past Surgical History:  Procedure Laterality Date  . ANTERIOR CERVICAL DECOMP/DISCECTOMY FUSION N/A 08/03/2016   Procedure: ANTERIOR CERVICAL DECOMPRESSION/DISCECTOMY FUSION C4-5, C5-6;  Surgeon: Barnett Abu, MD;  Location: [facility] OR;  Service: Neurosurgery;  Laterality: N/A;  . COLONOSCOPY    . KNEE ARTHROSCOPY     2007  . ROTATOR CUFF REPAIR Right 03/23/13  . TOTAL KNEE ARTHROPLASTY  09/22/2011   Procedure: TOTAL KNEE ARTHROPLASTY;  Surgeon: Raymon Mutton, MD;  Location: [facility] OR;  Service: Orthopedics;  Laterality: Right;    Social History   Socioeconomic History  . Marital status: Married    Spouse name: Not on file  . Number of children: 2  . Years of education: HS  . Highest education level: Not on file  Occupational History  . Occupation: Acupuncturist: [employer]  Social Needs  . Financial resource strain: Not on file  .  Food insecurity:    Worry: Not on file    Inability: Not on file  . Transportation needs:    Medical: Not on file    Non-medical: Not on file  Tobacco Use  . Smoking status: Never Smoker  . Smokeless tobacco: Never Used  Substance and Sexual Activity  . Alcohol use: Yes    Alcohol/week: 2.0 standard drinks    Types: 2 Glasses of wine per week  . Drug use: No  . Sexual activity: Not on file  Lifestyle  . Physical activity:    Days per week: Not on file    Minutes per session: Not on file  . Stress: Not on file  Relationships  . Social connections:    Talks on phone: Not on file    Gets together: Not on file     Attends religious service: Not on file    Active member of club or organization: Not on file    Attends meetings of clubs or organizations: Not on file    Relationship status: Not on file  . Intimate partner violence:    Fear of current or ex partner: Not on file    Emotionally abused: Not on file    Physically abused: Not on file    Forced sexual activity: Not on file  Other Topics Concern  . 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   1-2 cups caffeine daily.   Lives at home with her husband.    Family History  Problem Relation Age of Onset  . Cancer Mother        colon 59 and breast 39- deceased  . Colon cancer Mother   . Diabetes Father        living  . Asthma Father   . Hypertension Father   . Heart attack Father 82       medical management per pt  . Glaucoma Father        had eye transplant  . Kidney failure Father        acute renal failure- died 52  . Esophageal cancer Neg Hx   . Stomach cancer Neg Hx   . Rectal cancer Neg Hx     ROS: Tingling and burning pain in her lower extremities left greater than right and left knee arthralgias but no fevers or chills, productive cough, hemoptysis, dysphasia, odynophagia, melena, hematochezia, dysuria, hematuria, rash, seizure activity, orthopnea, PND, claudication. Remaining systems are negative.  Physical Exam: Well-developed morbidly obese in no acute distress.  Skin is warm and dry.  HEENT is normal.  Neck is supple.  Chest is clear to auscultation with normal expansion.  Cardiovascular exam is regular rate and rhythm.  Abdominal exam nontender or distended. No masses palpated. Extremities show 1+ edema on the left. neuro grossly intact  ECG-normal  sinus rhythm at a rate of 86, left bundle branch block.  Personally reviewed  A/P  1 cardiomyopathy-last echocardiogram showed mildly reduced LV function.  We will continue with ARB and beta-blocker.  2 hypertension-patient's blood pressure is controlled.  Continue present medications and follow.  Check potassium and renal function.  3 chronic combined systolic/diastolic congestive heart failure-patient is euvolemic  other than edema in left lower extremity.  Continue present dose of diuretic.  Check potassium and renal function.  Needs fluid restriction and low-sodium diet.  4 morbid obesity/snoring-I will refer to pulmonary for sleep study to exclude sleep apnea.  5 question density in left lower extremity on previous ultrasound-MRI recommended.  I have asked her to discuss with her primary care physician.  Olga Millers, MD

## 2018-08-25 ENCOUNTER — Ambulatory Visit: Payer: Medicare Other | Admitting: Cardiology

## 2018-08-25 ENCOUNTER — Other Ambulatory Visit: Payer: Self-pay

## 2018-08-25 ENCOUNTER — Encounter: Payer: Self-pay | Admitting: Cardiology

## 2018-08-25 ENCOUNTER — Telehealth: Payer: Self-pay | Admitting: Family

## 2018-08-25 VITALS — BP 134/82 | HR 86 | Ht 66.0 in | Wt 240.0 lb

## 2018-08-25 DIAGNOSIS — I5042 Chronic combined systolic (congestive) and diastolic (congestive) heart failure: Secondary | ICD-10-CM

## 2018-08-25 DIAGNOSIS — I420 Dilated cardiomyopathy: Secondary | ICD-10-CM

## 2018-08-25 DIAGNOSIS — I10 Essential (primary) hypertension: Secondary | ICD-10-CM

## 2018-08-25 DIAGNOSIS — R0683 Snoring: Secondary | ICD-10-CM | POA: Diagnosis not present

## 2018-08-25 NOTE — Telephone Encounter (Signed)
Please call pt to schedule office visit

## 2018-08-25 NOTE — Patient Instructions (Signed)
Medication Instructions:   NO CHANGE  Labwork:  Your physician recommends that you HAVE LAB WORK TODAY  Follow-Up:  Your physician wants you to follow-up in: 6 MONTHS WITH DR Jens Som You will receive a reminder letter in the mail two months in advance. If you don't receive a letter, please call our office to schedule the follow-up appointment.   CALL IN July TO SCHEDULE APPOINTMENT IN [place]

## 2018-08-25 NOTE — Telephone Encounter (Signed)
Copied from CRM [id number]. Topic: Quick Communication - See Telephone Encounter >> Aug 25, 2018 10:46 AM Arlyss Gandy, NT wrote: CRM for notification. See Telephone encounter for: 08/25/18. Pt states that she saw Dr. Jens Som today and he suggested for her to contact her PCP to have a MRI of the soft tissue of her left leg to be ordered due to the spot on the back of her leg.

## 2018-08-26 LAB — BASIC METABOLIC PANEL
BUN/Creatinine Ratio: 20 (ref 12–28)
BUN: 19 mg/dL (ref 8–27)
CO2: 24 mmol/L (ref 20–29)
Calcium: 9.5 mg/dL (ref 8.7–10.3)
Chloride: 100 mmol/L (ref 96–106)
Creatinine, Ser: 0.97 mg/dL (ref 0.57–1.00)
GFR calc Af Amer: 73 mL/min/{1.73_m2} (ref >59)
GFR, EST NON AFRICAN AMERICAN: 64 mL/min/{1.73_m2} (ref >59)
Glucose: 105 mg/dL — ABNORMAL HIGH (ref 65–99)
Potassium: 4.1 mmol/L (ref 3.5–5.2)
Sodium: 142 mmol/L (ref 134–144)

## 2018-08-26 NOTE — Telephone Encounter (Signed)
Can you please call patient for follow up appointment evaluation of leg. Thanks.

## 2018-08-26 NOTE — Telephone Encounter (Signed)
PT coming in 08/27/18

## 2018-08-27 ENCOUNTER — Ambulatory Visit (INDEPENDENT_AMBULATORY_CARE_PROVIDER_SITE_OTHER): Payer: Medicare Other | Admitting: Family

## 2018-08-27 ENCOUNTER — Other Ambulatory Visit: Payer: Self-pay

## 2018-08-27 ENCOUNTER — Encounter: Payer: Self-pay | Admitting: Family

## 2018-08-27 VITALS — BP 105/54 | HR 71 | Temp 98.5°F | Resp 16 | Ht 66.0 in

## 2018-08-27 DIAGNOSIS — M545 Low back pain, unspecified: Secondary | ICD-10-CM

## 2018-08-27 DIAGNOSIS — R600 Localized edema: Secondary | ICD-10-CM

## 2018-08-27 DIAGNOSIS — R202 Paresthesia of skin: Secondary | ICD-10-CM | POA: Diagnosis not present

## 2018-08-27 DIAGNOSIS — Z23 Encounter for immunization: Secondary | ICD-10-CM | POA: Diagnosis not present

## 2018-08-27 LAB — B12 AND FOLATE PANEL
Folate: 7.8 ng/mL (ref >5.9)
Vitamin B-12: 885 pg/mL (ref 211–911)

## 2018-08-27 MED FILL — AMOXICILLIN 500 MG CAPSULE: 500 | 4 days supply | Qty: 16 | Fill #0

## 2018-08-27 NOTE — Progress Notes (Signed)
Subjective:    Patient ID: Briana Miller, female    DOB: April 06, 1956, 63 y.o.   MRN: [id number]  HPI  Patient is a 63 yr old female who presents today to discuss soft tissue mass on her left leg.   She also reports that she has been having tingling and some intermittent numbness in both feet which has been causing her a lot of pain despite her 900mg  tid of gabapentin. She also reports that her back "is killing me."     Review of Systems See HPI  Past Medical History:  Diagnosis Date  . Acute systolic congestive heart failure (HCC) 03/01/2014  . Allergy   . Anemia   . Arthritis   . Cardiomyopathy (HCC)   . Cervical myelopathy (HCC)   . CHF (congestive heart failure) (HCC)   . GERD (gastroesophageal reflux disease)   . Heart disease   . Hyperlipidemia   . Hypertension   . Leg pain   . Overactive bladder      Social History   Socioeconomic History  . Marital status: Married    Spouse name: Not on file  . Number of children: 2  . Years of education: HS  . Highest education level: Not on file  Occupational History  . Occupation: Acupuncturist: [employer]  Social Needs  . Financial resource strain: Not on file  . Food insecurity:    Worry: Not on file    Inability: Not on file  . Transportation needs:    Medical: Not on file    Non-medical: Not on file  Tobacco Use  . Smoking status: Never Smoker  . Smokeless tobacco: Never Used  Substance and Sexual Activity  . Alcohol use: Yes    Alcohol/week: 2.0 standard drinks    Types: 2 Glasses of wine per week  . Drug use: No  . Sexual activity: Not on file  Lifestyle  . Physical activity:    Days per week: Not on file    Minutes per session: Not on file  . Stress: Not on file  Relationships  . Social connections:    Talks on phone: Not on file    Gets together: Not on file    Attends religious service: Not on file    Active member of club or organization: Not on file    Attends meetings of clubs or  organizations: Not on file    Relationship status: Not on file  . Intimate partner violence:    Fear of current or ex partner: Not on file    Emotionally abused: Not on file    Physically abused: Not on file    Forced sexual activity: Not on file  Other Topics Concern  . 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   1-2 cups caffeine daily.   Lives at home with her husband.    Past Surgical History:  Procedure Laterality Date  . ANTERIOR CERVICAL DECOMP/DISCECTOMY FUSION N/A 08/03/2016   Procedure: ANTERIOR CERVICAL DECOMPRESSION/DISCECTOMY FUSION C4-5, C5-6;  Surgeon: Barnett Abu, MD;  Location: [facility] OR;  Service: Neurosurgery;  Laterality: N/A;  . COLONOSCOPY    . KNEE ARTHROSCOPY  2007  . ROTATOR CUFF REPAIR Right 03/23/13  . TOTAL KNEE ARTHROPLASTY  09/22/2011   Procedure: TOTAL KNEE ARTHROPLASTY;  Surgeon: Raymon Mutton, MD;  Location: [facility] OR;  Service: Orthopedics;  Laterality: Right;    Family History  Problem Relation Age of Onset  . Cancer Mother        colon 47 and breast 34- deceased  . Colon cancer Mother   . Diabetes Father        living  . Asthma Father   . Hypertension Father   . Heart attack Father 79       medical management per pt  . Glaucoma Father        had eye transplant  . Kidney failure Father        acute renal failure- died 59  . Esophageal cancer Neg Hx   . Stomach cancer Neg Hx   . Rectal cancer Neg Hx     No Known Allergies  Current Outpatient Medications on File Prior to Visit  Medication Sig Dispense Refill  . calcium-vitamin D (OSCAL WITH D) 500-200 MG-UNIT per tablet Take 1 tablet by mouth daily.    Marland Kitchen gabapentin (NEURONTIN) 300 MG capsule Take 3 capsules (900 mg total)  by mouth 3 (three) times daily. 810 capsule 1  . latanoprost (XALATAN) 0.005 % ophthalmic solution Place 1 drop into both eyes at bedtime.  11  . losartan (COZAAR) 100 MG tablet Take 1 tablet (100 mg total) by mouth daily. 90 tablet 3  . Melatonin 3 MG TABS Take 1 tablet by mouth at bedtime.    . metoprolol succinate (TOPROL-XL) 50 MG 24 hr tablet Take 1 tablet (50 mg total) by mouth daily. Take with or immediately following a meal. 90 tablet 1  . nystatin (MYCOSTATIN/NYSTOP) powder APPLY TO AFFECTED AREA TWICE A DAY BENEATH BREASTS 30 g 5  . omeprazole (PRILOSEC) 40 MG capsule Take 1 capsule (40 mg total) by mouth daily. 30 capsule 5  . potassium chloride SA (K-DUR,KLOR-CON) 20 MEQ tablet Take 1 tablet (20 mEq total) by mouth daily. 90 tablet 3  . torsemide (DEMADEX) 10 MG tablet Take one tablet alternating with 2 tablets every other day 135 tablet 3  . traMADol (ULTRAM) 50 MG tablet Take 1 tablet (50 mg total) by mouth every 8 (eight) hours as needed. 30 tablet 0   No current facility-administered medications on file prior to visit.     BP (!) 105/54 (BP Location: Right Arm, Patient Position: Sitting, Cuff Size: Large)   Pulse 71   Temp 98.5 F (36.9 C) (Oral)   Resp 16   Ht 5\' 6"  (1.676 m)   LMP 06/17/2007   SpO2 100%   BMI 38.74 kg/m       Objective:   Physical Exam Constitutional:      Appearance: She is well-developed.  Neck:     Musculoskeletal: Neck supple.     Thyroid: No thyromegaly.  Cardiovascular:     Rate and Rhythm: Normal rate and regular rhythm.     Heart sounds: Normal heart sounds. No murmur.  Pulmonary:     Effort: Pulmonary effort is normal. No respiratory distress.     Breath sounds: Normal breath sounds. No wheezing.  Musculoskeletal:     Comments: Bilateral LE edema with firm edematous tissue bilateral LE from mid shin down.   Skin:    General: Skin is warm and dry.  Neurological:     Mental Status: She is alert and  oriented to person, place, and  time.     Comments: Diminished sensation to monofilament bilateral feet Bilateral LE weakness 4/5   Psychiatric:        Behavior: Behavior normal.        Thought Content: Thought content normal.        Judgment: Judgment normal.           Assessment & Plan:  paresthesia- patient with complicated history of multilevel disc disease and cervical myelopathy.  Will obtain follow up MRI of the lumbar spine, b12 and folate levels and will refer back to her Neurosurgeon.   LE edema- chronic, no obvious soft tissue mass. Monitor.

## 2018-08-27 NOTE — Patient Instructions (Signed)
Please complete lab work prior to leaving.   

## 2018-09-01 ENCOUNTER — Telehealth: Payer: Self-pay | Admitting: Cardiology

## 2018-09-01 NOTE — Telephone Encounter (Signed)
Called patient, advised patient of her potassium medication name to verify she had picked up the right thing.   Patient had no other questions at this time.

## 2018-09-01 NOTE — Telephone Encounter (Signed)
New Message         Patient is calling to get the correct name  of her potassium pills. Pls call and advise.

## 2018-09-17 ENCOUNTER — Other Ambulatory Visit: Payer: Self-pay | Admitting: Family

## 2018-09-29 ENCOUNTER — Other Ambulatory Visit: Payer: Self-pay | Admitting: Family

## 2018-09-29 NOTE — Telephone Encounter (Signed)
I sent refill for nystatin powder but if she is still having a bad rash to the point she needs another round of diflucan I would recommend virtual visit please.

## 2018-10-01 ENCOUNTER — Other Ambulatory Visit: Payer: Self-pay

## 2018-10-01 ENCOUNTER — Ambulatory Visit (INDEPENDENT_AMBULATORY_CARE_PROVIDER_SITE_OTHER): Payer: Medicare Other | Admitting: Family

## 2018-10-01 DIAGNOSIS — R202 Paresthesia of skin: Secondary | ICD-10-CM

## 2018-10-01 DIAGNOSIS — N760 Acute vaginitis: Secondary | ICD-10-CM

## 2018-10-01 DIAGNOSIS — L304 Erythema intertrigo: Secondary | ICD-10-CM | POA: Diagnosis not present

## 2018-10-01 DIAGNOSIS — R29898 Other symptoms and signs involving the musculoskeletal system: Secondary | ICD-10-CM | POA: Diagnosis not present

## 2018-10-01 DIAGNOSIS — R200 Anesthesia of skin: Secondary | ICD-10-CM

## 2018-10-01 MED ORDER — NYSTATIN 100000 UNIT/GM EX POWD: Freq: Two times a day (BID) | CUTANEOUS | 5 refills | Status: DC

## 2018-10-01 NOTE — Telephone Encounter (Signed)
Patient scheduled for today at 2:20 for virtual visit

## 2018-10-01 NOTE — Progress Notes (Signed)
Virtual Visit via Video Note  I connected with Briana Miller on 10/01/18 at  2:20 PM EDT by a video enabled telemedicine application and verified that I am speaking with the correct person using two identifiers.   I discussed the limitations of evaluation and management by telemedicine and the availability of in person appointments. The patient expressed understanding and agreed to proceed.  History of Present Illness:  Patient is a 63 yr old female who presents today with two concerns:  1) Breast rash-  Reports that she developed a sore and redness beneath the breast.  Reports that she took diflucan and it helped.  Noted that the vaginal itching improved with the diflucan.  Reports that rash is improved.    LE numbness- reports that she continues to have LE numbness/tingling which is worst at night and will often wake her up from sleep. Reports that she has not yet scheduled her MRI.  She remains wheelchair bound and unable to walk due to hx of cervical myelopathy.  Observations/Objective:   Gen: Awake, alert, no acute distress Resp: Breathing is even and non-labored Psych: calm/pleasant demeanor Neuro: Alert and Oriented x 3, + facial symmetry, speech is clear. Skin: mild intertrigo noted beneath right breast.  Small area of hyperpigmentation which appears to be a healed cyst.    Assessment and Plan:  Intertrigo-stable  will continue nystatin powder.  Vaginitis- clinically resolved following treatment with diflucan.  LE numbness- re-ordered MRI of the lumbar spine. I am concerned given her hx of cervical cord compression that she could have similar symptoms in her lumbar spine. Encouraged pt to schedule MRI.      Follow Up Instructions:    I discussed the assessment and treatment plan with the patient. The patient was provided an opportunity to ask questions and all were answered. The patient agreed with the plan and demonstrated an understanding of the instructions.    The patient was advised to call back or seek an in-person evaluation if the symptoms worsen or if the condition fails to improve as anticipated.   Lemont Fillers, NP

## 2018-10-15 ENCOUNTER — Ambulatory Visit: Payer: Medicare Other | Admitting: Cardiology

## 2018-10-22 ENCOUNTER — Telehealth: Payer: Self-pay

## 2018-10-22 ENCOUNTER — Ambulatory Visit: Payer: Medicare Other | Admitting: Cardiology

## 2018-10-22 ENCOUNTER — Encounter

## 2018-10-22 NOTE — Telephone Encounter (Signed)
Copied from CRM [id number]. Topic: General - Inquiry >> Oct 22, 2018 12:42 PM Mickel Baas B, NT wrote: Reason for CRM: Patient calling and would like to know if an MRI of her left leg could be added to the order for her MRI Lumbar? Please advise. CB#: [phone]

## 2018-10-23 ENCOUNTER — Other Ambulatory Visit: Payer: Self-pay | Admitting: Family

## 2018-10-24 ENCOUNTER — Encounter: Payer: Self-pay | Admitting: Family

## 2018-10-25 MED ORDER — CEPHALEXIN 500 MG PO CAPS: 500.0000 mg | ORAL_CAPSULE | Freq: Three times a day (TID) | ORAL | 0 refills | Status: DC

## 2018-10-25 NOTE — Telephone Encounter (Signed)
Please advise pt that I recommend keflex (antibiotic) as well as nystatin powder beneath breasts.  Hold off on diflucan. Schedule OV is symptoms worsen or if not much better in 3-4 days.

## 2018-10-25 NOTE — Telephone Encounter (Signed)
Patient advised of rx and to call if not improvement

## 2018-10-29 ENCOUNTER — Other Ambulatory Visit: Payer: Medicare Other

## 2018-11-23 ENCOUNTER — Other Ambulatory Visit: Payer: Self-pay | Admitting: Family

## 2018-11-25 ENCOUNTER — Encounter: Payer: Self-pay | Admitting: Family

## 2018-11-25 ENCOUNTER — Other Ambulatory Visit: Payer: Self-pay | Admitting: Family

## 2018-11-26 ENCOUNTER — Ambulatory Visit: Payer: Medicare Other | Admitting: Family

## 2018-11-26 NOTE — Telephone Encounter (Signed)
Called patient a few times but no answer, Lvm for her to call me back about her symptoms ans a possible face to face office visit.

## 2018-12-22 ENCOUNTER — Other Ambulatory Visit: Payer: Self-pay

## 2018-12-22 MED ORDER — GABAPENTIN 300 MG PO CAPS: 900.0000 mg | ORAL_CAPSULE | Freq: Three times a day (TID) | ORAL | 1 refills | Status: DC

## 2018-12-24 ENCOUNTER — Other Ambulatory Visit: Payer: Self-pay

## 2018-12-24 ENCOUNTER — Other Ambulatory Visit: Payer: Self-pay | Admitting: Family

## 2018-12-24 DIAGNOSIS — R601 Generalized edema: Secondary | ICD-10-CM

## 2018-12-24 MED ORDER — TORSEMIDE 10 MG PO TABS: ORAL_TABLET | ORAL | 1 refills | Status: DC

## 2018-12-24 NOTE — Telephone Encounter (Signed)
Medication: potassium chloride SA (K-DUR,KLOR-CON) 20 MEQ tablet [[id number]] , losartan (COZAAR) 100 MG tablet [[id number]] , metoprolol succinate (TOPROL-XL) 50 MG 24 hr tablet [[id number]]  Has the patient contacted their pharmacy? Yes  (Agent: If no, request that the patient contact the pharmacy for the refill.) (Agent: If yes, when and what did the pharmacy advise?)  Preferred Pharmacy (with phone number or street name): [id number] - [place], [place] [address] [phone] (Phone) [phone] (Fax)    Agent: Please be advised that RX refills may take up to 3 business days. We ask that you follow-up with your pharmacy.

## 2018-12-26 MED ORDER — POTASSIUM CHLORIDE CRYS ER 20 MEQ PO TBCR: 20.0000 meq | EXTENDED_RELEASE_TABLET | Freq: Every day | ORAL | 1 refills | Status: DC

## 2019-01-07 ENCOUNTER — Ambulatory Visit: Payer: Medicare Other | Admitting: Family

## 2019-01-13 ENCOUNTER — Other Ambulatory Visit: Payer: Self-pay | Admitting: Family

## 2019-01-13 ENCOUNTER — Other Ambulatory Visit: Payer: Self-pay

## 2019-01-14 ENCOUNTER — Ambulatory Visit: Payer: Medicare Other | Admitting: Family

## 2019-01-17 ENCOUNTER — Encounter: Payer: Self-pay | Admitting: Family

## 2019-01-18 ENCOUNTER — Encounter: Payer: Self-pay | Admitting: Family

## 2019-01-18 DIAGNOSIS — Z0000 Encounter for general adult medical examination without abnormal findings: Secondary | ICD-10-CM

## 2019-01-21 ENCOUNTER — Other Ambulatory Visit: Payer: Self-pay

## 2019-01-21 ENCOUNTER — Ambulatory Visit (INDEPENDENT_AMBULATORY_CARE_PROVIDER_SITE_OTHER): Payer: Medicare Other | Admitting: Family

## 2019-01-21 VITALS — BP 106/62 | HR 69 | Temp 98.2°F | Resp 16 | Ht 66.0 in | Wt 249.0 lb

## 2019-01-21 DIAGNOSIS — I420 Dilated cardiomyopathy: Secondary | ICD-10-CM

## 2019-01-21 DIAGNOSIS — I509 Heart failure, unspecified: Secondary | ICD-10-CM

## 2019-01-21 DIAGNOSIS — K219 Gastro-esophageal reflux disease without esophagitis: Secondary | ICD-10-CM

## 2019-01-21 DIAGNOSIS — R601 Generalized edema: Secondary | ICD-10-CM | POA: Diagnosis not present

## 2019-01-21 DIAGNOSIS — Z8639 Personal history of other endocrine, nutritional and metabolic disease: Secondary | ICD-10-CM

## 2019-01-21 DIAGNOSIS — R739 Hyperglycemia, unspecified: Secondary | ICD-10-CM

## 2019-01-21 DIAGNOSIS — I10 Essential (primary) hypertension: Secondary | ICD-10-CM

## 2019-01-21 LAB — COMPREHENSIVE METABOLIC PANEL
ALT: 11 U/L (ref 0–35)
AST: 14 U/L (ref 0–37)
Albumin: 4.6 g/dL (ref 3.5–5.2)
Alkaline Phosphatase: 95 U/L (ref 39–117)
BUN: 25 mg/dL — ABNORMAL HIGH (ref 6–23)
CO2: 26 mEq/L (ref 19–32)
Calcium: 9.5 mg/dL (ref 8.4–10.5)
Chloride: 105 mEq/L (ref 96–112)
Creatinine, Ser: 0.99 mg/dL (ref 0.40–1.20)
GFR: 68.98 mL/min (ref >60.00)
Glucose, Bld: 99 mg/dL (ref 70–99)
Potassium: 4.1 mEq/L (ref 3.5–5.1)
Sodium: 141 mEq/L (ref 135–145)
Total Bilirubin: 0.3 mg/dL (ref 0.2–1.2)
Total Protein: 7.7 g/dL (ref 6.0–8.3)

## 2019-01-21 LAB — HEMOGLOBIN A1C: Hgb A1c MFr Bld: 6.2 % (ref 4.6–6.5)

## 2019-01-21 LAB — LIPID PANEL
Cholesterol: 187 mg/dL (ref 0–200)
HDL: 53.5 mg/dL (ref >39.00)
LDL Cholesterol: 109 mg/dL — ABNORMAL HIGH (ref 0–99)
NonHDL: 133.09
Total CHOL/HDL Ratio: 3
Triglycerides: 119 mg/dL (ref 0.0–149.0)
VLDL: 23.8 mg/dL (ref 0.0–40.0)

## 2019-01-21 MED ORDER — OMEPRAZOLE 40 MG PO CPDR: 40.0000 mg | DELAYED_RELEASE_CAPSULE | Freq: Every day | ORAL | 1 refills | Status: DC

## 2019-01-21 MED ORDER — POTASSIUM CHLORIDE CRYS ER 20 MEQ PO TBCR: 20.0000 meq | EXTENDED_RELEASE_TABLET | Freq: Every day | ORAL | 1 refills | Status: DC

## 2019-01-21 MED ORDER — LOSARTAN POTASSIUM 100 MG PO TABS: 100.0000 mg | ORAL_TABLET | Freq: Every day | ORAL | 1 refills | Status: DC

## 2019-01-21 MED ORDER — TORSEMIDE 10 MG PO TABS: ORAL_TABLET | ORAL | 1 refills | Status: DC

## 2019-01-21 MED ORDER — METOPROLOL SUCCINATE ER 50 MG PO TB24: 50.0000 mg | ORAL_TABLET | Freq: Every day | ORAL | 1 refills | Status: DC

## 2019-01-21 MED ORDER — GABAPENTIN 300 MG PO CAPS: 900.0000 mg | ORAL_CAPSULE | Freq: Three times a day (TID) | ORAL | 1 refills | Status: DC

## 2019-01-21 NOTE — Progress Notes (Addendum)
Subjective:    Patient ID: Briana Miller, female    DOB: 1956-03-24, 63 y.o.   MRN: [id number]  HPI   Patient is a 63 yr old female who presents today for 3 month follow up.  HTN- maintained on losartan 100mg , demadex, toprol xl.   BP Readings from Last 3 Encounters:  01/21/19 106/62  08/27/18 (!) 105/54  08/25/18 134/82   GERD- maintained  On omeprazole. Reports that she has frequent gerd.  Reports that she has bm every 3-4 days. Has follow up with GI next month.    Denies pain.    Patient reports that she is hoping to get her knee surgery soon.    Hyperglycemia-  Lab Results  Component Value Date   HGBA1C 6.2 01/21/2019    Review of Systems    see HPI  Past Medical History:  Diagnosis Date  . Acute systolic congestive heart failure (HCC) 03/01/2014  . Allergy   . Anemia   . Arthritis   . Cardiomyopathy (HCC)   . Cervical myelopathy (HCC)   . CHF (congestive heart failure) (HCC)   . GERD (gastroesophageal reflux disease)   . Heart disease   . Hyperlipidemia   . Hypertension   . Leg pain   . Overactive bladder      Social History   Socioeconomic History  . Marital status: Married    Spouse name: Not on file  . Number of children: 2  . Years of education: HS  . Highest education level: Not on file  Occupational History  . Occupation: Acupuncturist: [employer]  Social Needs  . Financial resource strain: Not on file  . Food insecurity    Worry: Not on file    Inability: Not on file  . Transportation needs    Medical: Not on file    Non-medical: Not on file  Tobacco Use  . Smoking status: Never Smoker  . Smokeless tobacco: Never Used  Substance and Sexual Activity  . Alcohol use: Yes    Alcohol/week: 2.0 standard drinks    Types: 2 Glasses of wine per week  . Drug use: No  . Sexual activity: Not on file  Lifestyle  . Physical activity    Days per week: Not on file    Minutes per session: Not on file  . Stress: Not on file   Relationships  . Social Musician on phone: Not on file    Gets together: Not on file    Attends religious service: Not on file    Active member of club or organization: Not on file    Attends meetings of clubs or organizations: Not on file    Relationship status: Not on file  . Intimate partner violence    Fear of current or ex partner: Not on file    Emotionally abused: Not on file    Physically abused: Not on file    Forced sexual activity: Not on file  Other Topics Concern  . 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   1-2 cups caffeine daily.   Lives at home with her husband.    Past  Surgical History:  Procedure Laterality Date  . ANTERIOR CERVICAL DECOMP/DISCECTOMY FUSION N/A 08/03/2016   Procedure: ANTERIOR CERVICAL DECOMPRESSION/DISCECTOMY FUSION C4-5, C5-6;  Surgeon: Barnett AbuHenry Elsner, MD;  Location[facility] OR;  Service: Neurosurgery;  Laterality: N/A;  . COLONOSCOPY    . KNEE ARTHROSCOPY     2007  . ROTATOR CUFF REPAIR Right 03/23/13  . TOTAL KNEE ARTHROPLASTY  09/22/2011   Procedure: TOTAL KNEE ARTHROPLASTY;  Surgeon: Raymon MuttonStephen D Lucey, MD;  Location: [facility] OR;  Service: Orthopedics;  Laterality: Right;    Family History  Problem Relation Age of Onset  . Cancer Mother        colon 2360 and breast 7154- deceased  . Colon cancer Mother   . Diabetes Father        living  . Asthma Father   . Hypertension Father   . Heart attack Father 6279       medical management per pt  . Glaucoma Father        had eye transplant  . Kidney failure Father        acute renal failure- died 3192  . Esophageal cancer Neg Hx   . Stomach cancer Neg Hx   . Rectal cancer Neg Hx     No Known Allergies  Current Outpatient  Medications on File Prior to Visit  Medication Sig Dispense Refill  . calcium-vitamin D (OSCAL WITH D) 500-200 MG-UNIT per tablet Take 1 tablet by mouth daily.    Marland Kitchen. gabapentin (NEURONTIN) 300 MG capsule Take 3 capsules (900 mg total) by mouth 3 (three) times daily. 810 capsule 1  . latanoprost (XALATAN) 0.005 % ophthalmic solution Place 1 drop into both eyes at bedtime.  11  . losartan (COZAAR) 100 MG tablet Take 1 tablet (100 mg total) by mouth daily. 90 tablet 3  . Melatonin 3 MG TABS Take 1 tablet by mouth at bedtime.    . metoprolol succinate (TOPROL-XL) 50 MG 24 hr tablet TAKE 1 TABLET (50 MG TOTAL) BY MOUTH DAILY. TAKE WITH OR IMMEDIATELY FOLLOWING A MEAL. 90 tablet 1  . nystatin (MYCOSTATIN/NYSTOP) powder Apply topically 2 (two) times daily. 30 g 5  . omeprazole (PRILOSEC) 40 MG capsule Take 1 capsule (40 mg total) by mouth daily. 30 capsule 5  . potassium chloride SA (K-DUR) 20 MEQ tablet Take 1 tablet (20 mEq total) by mouth daily. 90 tablet 1  . torsemide (DEMADEX) 10 MG tablet Take one tablet alternating with 2 tablets every other day 135 tablet 1  . traMADol (ULTRAM) 50 MG tablet Take 1 tablet (50 mg total) by mouth every 8 (eight) hours as needed. 30 tablet 0   No current facility-administered medications on file prior to visit.     BP 106/62   Pulse 69   Temp 98.2 F (36.8 C) (Oral)   Resp 16   Ht 5\' 6"  (1.676 m)   Wt 249 lb (112.9 kg)   LMP 06/17/2007   SpO2 100%   BMI 40.19 kg/m    Objective:   Physical Exam Constitutional:      Appearance: She is well-developed.  Neck:     Musculoskeletal: Neck supple.     Thyroid: No thyromegaly.  Cardiovascular:     Rate and Rhythm: Normal rate and regular rhythm.     Heart sounds: Normal heart sounds. No murmur.  Pulmonary:     Effort: Pulmonary effort is normal. No respiratory distress.     Breath sounds: Normal breath sounds. No wheezing.  Musculoskeletal:  Comments: 2+ bilateral LE edema  Skin:    General: Skin  is warm and dry.  Neurological:     Mental Status: She is alert and oriented to person, place, and time.  Psychiatric:        Behavior: Behavior normal.        Thought Content: Thought content normal.        Judgment: Judgment normal.           Assessment & Plan:  HTN- bp stable.  Continue current meds.  GERD- stable on PPI. Continue same.  Hyperglycemia-  Lab Results  Component Value Date   HGBA1C 6.2 01/21/2019   A1C stable. Continue to monitor. She is working on weight loss.  Wt Readings from Last 3 Encounters:  01/21/19 249 lb (112.9 kg)  08/25/18 240 lb (108.9 kg)  10/26/17 255 lb (115.7 kg)   CHF- appears euvolemic today. I advised pt to schedule a pre-op clearance with me amd her surgeon within 30 days of planned surgery.   Addendum:  Pt is requesting an rx for a wheelchair.  She is non-ambulatory and wheelchair bound. I have examined her current wheelchair and it has a broken brake.  Pt needs a new wheelchair and an rx has been faxed at her request to supplier.

## 2019-01-24 ENCOUNTER — Encounter: Payer: Self-pay | Admitting: Family

## 2019-01-26 ENCOUNTER — Encounter: Payer: Self-pay | Admitting: Family

## 2019-01-26 ENCOUNTER — Encounter: Payer: Self-pay | Admitting: *Deleted

## 2019-01-26 NOTE — Telephone Encounter (Signed)
Pt stated she contacted both companies regarding an order for a wheelchair but she was told they have not received an order. Pt requests that an order for wheelchair be sent.

## 2019-01-27 ENCOUNTER — Telehealth: Payer: Self-pay | Admitting: *Deleted

## 2019-01-27 ENCOUNTER — Encounter: Payer: Self-pay | Admitting: Family

## 2019-01-27 NOTE — Telephone Encounter (Signed)
rx faxed to phone number provided

## 2019-01-27 NOTE — Telephone Encounter (Signed)
Copied from [place] [id number]. Topic: General - Other >> Jan 27, 2019  2:44 PM Keene Breath wrote: Reason for CRM: Medical Supplies called to inform the nurse or doctor that they need the demographic sheet for the patient and the office notes indicating the need for the wheelchair.  Please advise and call back to discuss at [phone]

## 2019-01-27 NOTE — Telephone Encounter (Signed)
Please sent rx for wheelchair with below measurements and notify pt that this has been sent.

## 2019-01-28 ENCOUNTER — Encounter: Payer: Self-pay | Admitting: Family

## 2019-01-28 ENCOUNTER — Ambulatory Visit: Payer: Medicare Other | Admitting: Gastroenterology

## 2019-01-28 NOTE — Telephone Encounter (Signed)
All the requested information faxed today

## 2019-01-28 NOTE — Telephone Encounter (Signed)
Can you please check on this paperwork, is there a fax? If not please see if pt can attach file or call company to request.

## 2019-02-01 ENCOUNTER — Other Ambulatory Visit: Payer: Self-pay | Admitting: Family

## 2019-02-01 DIAGNOSIS — Z1231 Encounter for screening mammogram for malignant neoplasm of breast: Secondary | ICD-10-CM

## 2019-02-03 ENCOUNTER — Other Ambulatory Visit: Payer: Self-pay | Admitting: Family

## 2019-02-03 DIAGNOSIS — Z1231 Encounter for screening mammogram for malignant neoplasm of breast: Secondary | ICD-10-CM

## 2019-02-04 ENCOUNTER — Ambulatory Visit (INDEPENDENT_AMBULATORY_CARE_PROVIDER_SITE_OTHER): Payer: Medicare Other | Admitting: Gastroenterology

## 2019-02-04 ENCOUNTER — Encounter: Payer: Self-pay | Admitting: Gastroenterology

## 2019-02-04 ENCOUNTER — Ambulatory Visit (HOSPITAL_BASED_OUTPATIENT_CLINIC_OR_DEPARTMENT_OTHER): Payer: Medicare Other

## 2019-02-04 VITALS — BP 128/80 | HR 76 | Temp 98.0°F

## 2019-02-04 DIAGNOSIS — Z800 Family history of malignant neoplasm of digestive organs: Secondary | ICD-10-CM

## 2019-02-04 DIAGNOSIS — R109 Unspecified abdominal pain: Secondary | ICD-10-CM | POA: Diagnosis not present

## 2019-02-04 NOTE — Patient Instructions (Signed)
Due to recent COVID-19 restrictions implemented by our local and state authorities and in an effort to keep both patients and staff as safe as possible, our hospital system now requires COVID-19 testing prior to any scheduled hospital procedure. Please go to our [facility] location drive thru testing site ([address], [place], [place] [address]) on 02/21/19, any time between 9:30am - 3 pm (Wednesday hours are 9:30 am-12 pm and Saturday hours are 9 am-12:30 pm). There will be multiple testing areas, the first checkpoint being for pre-procedure/surgery testing. Get into the right (yellow) lane that leads to the PAT testing team. You will not be billed at the time of testing but may receive a bill later depending on your insurance. The approximate cost of the test is $100. You must agree to quarantine from the time of your testing until the procedure date on 02/24/19 . This should include staying at home with ONLY the people you live with. Avoid take-out, grocery store shopping or leaving the house for any non-emergent reason. Please call our office at [phone] if you have any questions.    You have been scheduled for a colonoscopy. Please follow written instructions given to you at your visit today.  Please pick up your prep supplies at the pharmacy within the next 1-3 days. If you use inhalers (even only as needed), please bring them with you on the day of your procedure. Your physician has requested that you go to www.startemmi.com and enter the access code given to you at your visit today. This web site gives a general overview about your procedure. However, you should still follow specific instructions given to you by our office regarding your preparation for the procedure.   Thank you for entrusting me with your care and choosing [facility].  Dr Ardis Hughs

## 2019-02-04 NOTE — H&P (View-Only) (Signed)
Review of pertinent gastrointestinal problems: 1. Family history of colon cancer; mother in her early 26s; colonoscopy Dr. Ardis Hughs 12/2014found diverticulosis, no polyps, recommended recall at 5 years. 2. RUQ pains:clinically seemed biliary-like; 05/2013 [place] [facility] was normal; 05/2013 HIDA scan with GB EF was normal.05/2013 LFTs were normal. Was recommended, scheduled for EGD but she cancelled it. EGD 05/2014 Dr. Ardis Hughs, mild to moderate gastritis; path showed no H. Pylori. She had been taking BC powders twice a day (around 2013-2015).  Repeat EGD June 2017, Dr. Ardis Hughs.  This was requested by her bariatric surgeon prior to surgery given her history of gastritis.  I noted a small about of food residue in the stomach, the examination was otherwise normal.    3. Morbid obesity BMI 41 (10/2017 data)   HPI: This is a very pleasant 63 year old woman   I saw her a little over a year ago for bloating, belching and nausea.  Tramadol might have been contributing to some of her symptoms.  I ordered a gastric emptying scan which she never had done.  I did believe that a lot of her symptoms were probably related to her morbid obesity which caused orthopedic issue which in turn caused her to be wheelchair-bound.  Blood work August 2020 shows a normal complete metabolic profile, normal hemoglobin A1c  CBC July 2019 was normal  She is here today because of a "sizzling" sensation has been going on in her right flank for the past month or so.  It is intermittent, it lasts about 2 minutes.  Today it is more of a dull pain in that same location.  She is also been having some intermittent epigastric pains, but these are going on for 2 or 3 months, they are intermittent, they make her feel nauseous.  Abdominal ultrasound 3 years ago was normal.  Normal gallbladder.  Chief complaint is right sided abdominal and epigastric discomfort, family history of colon cancer  ROS: complete GI ROS as described in HPI, all  other review negative.  Constitutional:  No unintentional weight loss   Past Medical History:  Diagnosis Date  . Acute systolic congestive heart failure ([place]) 03/01/2014  . Allergy   . Anemia   . Arthritis   . Cardiomyopathy ([place])   . Cervical myelopathy ([place])   . CHF (congestive heart failure) ([place])   . GERD (gastroesophageal reflux disease)   . Heart disease   . Hyperlipidemia   . Hypertension   . Leg pain   . Overactive bladder     Past Surgical History:  Procedure Laterality Date  . ANTERIOR CERVICAL DECOMP/DISCECTOMY FUSION N/A 08/03/2016   Procedure: ANTERIOR CERVICAL DECOMPRESSION/DISCECTOMY FUSION C4-5, C5-6;  Surgeon: Kristeen Miss, MD;  Location: [place] [facility];  Service: Neurosurgery;  Laterality: N/A;  . COLONOSCOPY    . KNEE ARTHROSCOPY     2007  . ROTATOR CUFF REPAIR Right 03/23/13  . TOTAL KNEE ARTHROPLASTY  09/22/2011   Procedure: TOTAL KNEE ARTHROPLASTY;  Surgeon: Rudean Haskell, MD;  Location: [place];  Service: Orthopedics;  Laterality: Right;    Current Outpatient Medications  Medication Sig Dispense Refill  . calcium-vitamin D (OSCAL WITH D) 500-200 MG-UNIT per tablet Take 1 tablet by mouth daily.    Marland Kitchen gabapentin (NEURONTIN) 300 MG capsule Take 3 capsules (900 mg total) by mouth 3 (three) times daily. 810 capsule 1  . latanoprost (XALATAN) 0.005 % ophthalmic solution Place 1 drop into both eyes at bedtime.  11  . losartan (COZAAR) 100 MG tablet Take 1  tablet (100 mg total) by mouth daily. 90 tablet 1  . Melatonin 3 MG TABS Take 1 tablet by mouth at bedtime.    . metoprolol succinate (TOPROL-XL) 50 MG 24 hr tablet Take 1 tablet (50 mg total) by mouth daily. Take with or immediately following a meal. 90 tablet 1  . nystatin (MYCOSTATIN/NYSTOP) powder Apply topically 2 (two) times daily. 30 g 5  . omeprazole (PRILOSEC) 40 MG capsule Take 1 capsule (40 mg total) by mouth daily. 90 capsule 1  . potassium chloride SA (K-DUR) 20 MEQ tablet Take 1 tablet (20 mEq total) by  mouth daily. 90 tablet 1  . torsemide (DEMADEX) 10 MG tablet Take one tablet alternating with 2 tablets every other day 135 tablet 1   No current facility-administered medications for this visit.     Allergies as of 02/04/2019  . (No Known Allergies)    Family History  Problem Relation Age of Onset  . Cancer Mother        colon 3 and breast 52- deceased  . Colon cancer Mother   . Diabetes Father        living  . Asthma Father   . Hypertension Father   . Heart attack Father 19       medical management per pt  . Glaucoma Father        had eye transplant  . Kidney failure Father        acute renal failure- died 31  . Esophageal cancer Neg Hx   . Stomach cancer Neg Hx   . Rectal cancer Neg Hx     Social History   Socioeconomic History  . Marital status: Married    Spouse name: Not on file  . Number of children: 2  . Years of education: HS  . Highest education level: Not on file  Occupational History  . Occupation: Acupuncturist: [employer]  Social Needs  . Financial resource strain: Not on file  . Food insecurity    Worry: Not on file    Inability: Not on file  . Transportation needs    Medical: Not on file    Non-medical: Not on file  Tobacco Use  . Smoking status: Never Smoker  . Smokeless tobacco: Never Used  Substance and Sexual Activity  . Alcohol use: Yes    Alcohol/week: 2.0 standard drinks    Types: 2 Glasses of wine per week  . Drug use: No  . Sexual activity: Not on file  Lifestyle  . Physical activity    Days per week: Not on file    Minutes per session: Not on file  . Stress: Not on file  Relationships  . Social Musician on phone: Not on file    Gets together: Not on file    Attends religious service: Not on file    Active member of club or organization: Not on file    Attends meetings of clubs or organizations: Not on file    Relationship status: Not on file  . Intimate partner violence    Fear of current or ex  partner: Not on file    Emotionally abused: Not on file    Physically abused: Not on file    Forced sexual activity: Not on file  Other Topics Concern  . 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   1-2 cups caffeine daily.   Lives at home with her husband.     Physical Exam: BP 128/80 (BP Location: Left Wrist, Patient Position: Sitting, Cuff Size: Normal)   Pulse 76   Temp 98 F (36.7 C)   LMP 06/17/2007  Constitutional: Morbidly obese, sitting in a wheelchair Psychiatric: alert and oriented x3 Abdomen: soft, nontender, nondistended, no obvious ascites, no peritoneal signs, normal bowel sounds No peripheral edema noted in lower extremities  Assessment and plan: 63 y.o. female with right-sided abdominal and epigastric discomforts, family history of colon cancer, nonambulatory and wheelchair  I think her abdominal sizzling sensation and epigastric discomforts are likely functional.  She is wheelchair-bound, nonambulatory due to morbid obesity and resultant orthopedic issues.  Abdominal ultrasound 3 years ago was normal.  Liver tests and CBC recently were all normal.  I recommended colonoscopy to get her up-to-date on colon cancer screening given her mother's history of colon cancer around age 50.  Her last examination was 6 years ago.  It will have to be at the hospital since she is nonambulatory.  Please see the "Patient Instructions" section for addition details about the plan.  Jiyan Walkowski, MD [facility] 02/04/2019, 11:18 AM   

## 2019-02-04 NOTE — Progress Notes (Signed)
Review of pertinent gastrointestinal problems: 1. Family history of colon cancer; mother in her early 26s; colonoscopy Dr. Ardis Hughs 12/2014found diverticulosis, no polyps, recommended recall at 5 years. 2. RUQ pains:clinically seemed biliary-like; 05/2013 [place] [facility] was normal; 05/2013 HIDA scan with GB EF was normal.05/2013 LFTs were normal. Was recommended, scheduled for EGD but she cancelled it. EGD 05/2014 Dr. Ardis Hughs, mild to moderate gastritis; path showed no H. Pylori. She had been taking BC powders twice a day (around 2013-2015).  Repeat EGD June 2017, Dr. Ardis Hughs.  This was requested by her bariatric surgeon prior to surgery given her history of gastritis.  I noted a small about of food residue in the stomach, the examination was otherwise normal.    3. Morbid obesity BMI 41 (10/2017 data)   HPI: This is a very pleasant 63 year old woman   I saw her a little over a year ago for bloating, belching and nausea.  Tramadol might have been contributing to some of her symptoms.  I ordered a gastric emptying scan which she never had done.  I did believe that a lot of her symptoms were probably related to her morbid obesity which caused orthopedic issue which in turn caused her to be wheelchair-bound.  Blood work August 2020 shows a normal complete metabolic profile, normal hemoglobin A1c  CBC July 2019 was normal  She is here today because of a "sizzling" sensation has been going on in her right flank for the past month or so.  It is intermittent, it lasts about 2 minutes.  Today it is more of a dull pain in that same location.  She is also been having some intermittent epigastric pains, but these are going on for 2 or 3 months, they are intermittent, they make her feel nauseous.  Abdominal ultrasound 3 years ago was normal.  Normal gallbladder.  Chief complaint is right sided abdominal and epigastric discomfort, family history of colon cancer  ROS: complete GI ROS as described in HPI, all  other review negative.  Constitutional:  No unintentional weight loss   Past Medical History:  Diagnosis Date  . Acute systolic congestive heart failure ([place]) 03/01/2014  . Allergy   . Anemia   . Arthritis   . Cardiomyopathy ([place])   . Cervical myelopathy ([place])   . CHF (congestive heart failure) ([place])   . GERD (gastroesophageal reflux disease)   . Heart disease   . Hyperlipidemia   . Hypertension   . Leg pain   . Overactive bladder     Past Surgical History:  Procedure Laterality Date  . ANTERIOR CERVICAL DECOMP/DISCECTOMY FUSION N/A 08/03/2016   Procedure: ANTERIOR CERVICAL DECOMPRESSION/DISCECTOMY FUSION C4-5, C5-6;  Surgeon: Kristeen Miss, MD;  Location: [facility];  Service: Neurosurgery;  Laterality: N/A;  . COLONOSCOPY    . KNEE ARTHROSCOPY     2007  . ROTATOR CUFF REPAIR Right 03/23/13  . TOTAL KNEE ARTHROPLASTY  09/22/2011   Procedure: TOTAL KNEE ARTHROPLASTY;  Surgeon: Rudean Haskell, MD;  Location: [place];  Service: Orthopedics;  Laterality: Right;    Current Outpatient Medications  Medication Sig Dispense Refill  . calcium-vitamin D (OSCAL WITH D) 500-200 MG-UNIT per tablet Take 1 tablet by mouth daily.    Marland Kitchen gabapentin (NEURONTIN) 300 MG capsule Take 3 capsules (900 mg total) by mouth 3 (three) times daily. 810 capsule 1  . latanoprost (XALATAN) 0.005 % ophthalmic solution Place 1 drop into both eyes at bedtime.  11  . losartan (COZAAR) 100 MG tablet Take 1  tablet (100 mg total) by mouth daily. 90 tablet 1  . Melatonin 3 MG TABS Take 1 tablet by mouth at bedtime.    . metoprolol succinate (TOPROL-XL) 50 MG 24 hr tablet Take 1 tablet (50 mg total) by mouth daily. Take with or immediately following a meal. 90 tablet 1  . nystatin (MYCOSTATIN/NYSTOP) powder Apply topically 2 (two) times daily. 30 g 5  . omeprazole (PRILOSEC) 40 MG capsule Take 1 capsule (40 mg total) by mouth daily. 90 capsule 1  . potassium chloride SA (K-DUR) 20 MEQ tablet Take 1 tablet (20 mEq total) by  mouth daily. 90 tablet 1  . torsemide (DEMADEX) 10 MG tablet Take one tablet alternating with 2 tablets every other day 135 tablet 1   No current facility-administered medications for this visit.     Allergies as of 02/04/2019  . (No Known Allergies)    Family History  Problem Relation Age of Onset  . Cancer Mother        colon 3 and breast 52- deceased  . Colon cancer Mother   . Diabetes Father        living  . Asthma Father   . Hypertension Father   . Heart attack Father 19       medical management per pt  . Glaucoma Father        had eye transplant  . Kidney failure Father        acute renal failure- died 31  . Esophageal cancer Neg Hx   . Stomach cancer Neg Hx   . Rectal cancer Neg Hx     Social History   Socioeconomic History  . Marital status: Married    Spouse name: Not on file  . Number of children: 2  . Years of education: HS  . Highest education level: Not on file  Occupational History  . Occupation: Acupuncturist: [employer]  Social Needs  . Financial resource strain: Not on file  . Food insecurity    Worry: Not on file    Inability: Not on file  . Transportation needs    Medical: Not on file    Non-medical: Not on file  Tobacco Use  . Smoking status: Never Smoker  . Smokeless tobacco: Never Used  Substance and Sexual Activity  . Alcohol use: Yes    Alcohol/week: 2.0 standard drinks    Types: 2 Glasses of wine per week  . Drug use: No  . Sexual activity: Not on file  Lifestyle  . Physical activity    Days per week: Not on file    Minutes per session: Not on file  . Stress: Not on file  Relationships  . Social Musician on phone: Not on file    Gets together: Not on file    Attends religious service: Not on file    Active member of club or organization: Not on file    Attends meetings of clubs or organizations: Not on file    Relationship status: Not on file  . Intimate partner violence    Fear of current or ex  partner: Not on file    Emotionally abused: Not on file    Physically abused: Not on file    Forced sexual activity: Not on file  Other Topics Concern  . 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   1-2 cups caffeine daily.   Lives at home with her husband.     Physical Exam: BP 128/80 (BP Location: Left Wrist, Patient Position: Sitting, Cuff Size: Normal)   Pulse 76   Temp 98 F (36.7 C)   LMP 06/17/2007  Constitutional: Morbidly obese, sitting in a wheelchair Psychiatric: alert and oriented x3 Abdomen: soft, nontender, nondistended, no obvious ascites, no peritoneal signs, normal bowel sounds No peripheral edema noted in lower extremities  Assessment and plan: 63 y.o. female with right-sided abdominal and epigastric discomforts, family history of colon cancer, nonambulatory and wheelchair  I think her abdominal sizzling sensation and epigastric discomforts are likely functional.  She is wheelchair-bound, nonambulatory due to morbid obesity and resultant orthopedic issues.  Abdominal ultrasound 3 years ago was normal.  Liver tests and CBC recently were all normal.  I recommended colonoscopy to get her up-to-date on colon cancer screening given her mother's history of colon cancer around age 350.  Her last examination was 6 years ago.  It will have to be at the hospital since she is nonambulatory.  Please see the "Patient Instructions" section for addition details about the plan.  Rob Buntinganiel Jenney Brester, MD [facility] 02/04/2019, 11:18 AM

## 2019-02-10 ENCOUNTER — Telehealth: Payer: Self-pay | Admitting: Gastroenterology

## 2019-02-10 ENCOUNTER — Other Ambulatory Visit: Payer: Self-pay | Admitting: Gastroenterology

## 2019-02-10 MED ORDER — PEG 3350-KCL-NA BICARB-NACL 420 G PO SOLR: 4000.0000 mL | ORAL | 0 refills | Status: DC

## 2019-02-10 NOTE — Telephone Encounter (Signed)
Spoke to patient sent bowel prep to [facility] instead of mail order. Patient voiced understanding.

## 2019-02-22 ENCOUNTER — Other Ambulatory Visit (HOSPITAL_COMMUNITY): Payer: Self-pay | Admitting: *Deleted

## 2019-02-22 ENCOUNTER — Telehealth: Payer: Self-pay | Admitting: Gastroenterology

## 2019-02-22 ENCOUNTER — Other Ambulatory Visit (HOSPITAL_COMMUNITY)
Admission: RE | Admit: 2019-02-22 | Discharge: 2019-02-22 | Disposition: A | Discharge status: Home / Self Care | Payer: Medicare Other | Source: Ambulatory Visit | Attending: Gastroenterology | Admitting: Gastroenterology

## 2019-02-22 DIAGNOSIS — Z01812 Encounter for preprocedural laboratory examination: Secondary | ICD-10-CM | POA: Diagnosis present

## 2019-02-22 DIAGNOSIS — Z20828 Contact with and (suspected) exposure to other viral communicable diseases: Secondary | ICD-10-CM | POA: Insufficient documentation

## 2019-02-22 LAB — SARS CORONAVIRUS 2 (TAT 6-24 HRS): SARS Coronavirus 2: NEGATIVE

## 2019-02-22 NOTE — Telephone Encounter (Signed)
The pt did get to the [facility] for her COVID testing.  Nothing further needed.

## 2019-02-22 NOTE — Telephone Encounter (Signed)
Pt stated that she had went to [facility] for COVID-19 test but was told that she was not supposed to go there.

## 2019-02-22 NOTE — Progress Notes (Signed)
No answer voice mail full called patient for pre op history and instructions for 02-24-19 procedure

## 2019-02-23 NOTE — Progress Notes (Signed)
Attempted pre-op call for procedure 02/24/19, no answer and voicemail full.

## 2019-02-24 ENCOUNTER — Ambulatory Visit (HOSPITAL_COMMUNITY): Payer: Medicare Other | Admitting: Anesthesiology

## 2019-02-24 ENCOUNTER — Other Ambulatory Visit: Payer: Self-pay

## 2019-02-24 ENCOUNTER — Telehealth: Payer: Self-pay

## 2019-02-24 ENCOUNTER — Ambulatory Visit (HOSPITAL_COMMUNITY)
Admission: RE | Admit: 2019-02-24 | Discharge: 2019-02-24 | Disposition: A | Discharge status: Home / Self Care | Payer: Medicare Other | Attending: Gastroenterology | Admitting: Gastroenterology

## 2019-02-24 ENCOUNTER — Other Ambulatory Visit: Payer: Self-pay | Admitting: Gastroenterology

## 2019-02-24 ENCOUNTER — Encounter (HOSPITAL_COMMUNITY): Payer: Self-pay

## 2019-02-24 ENCOUNTER — Encounter (HOSPITAL_COMMUNITY)
Admission: RE | Disposition: A | Discharge status: Home / Self Care | Payer: Self-pay | Source: Home / Self Care | Attending: Gastroenterology

## 2019-02-24 DIAGNOSIS — M1990 Unspecified osteoarthritis, unspecified site: Secondary | ICD-10-CM | POA: Insufficient documentation

## 2019-02-24 DIAGNOSIS — K219 Gastro-esophageal reflux disease without esophagitis: Secondary | ICD-10-CM | POA: Diagnosis not present

## 2019-02-24 DIAGNOSIS — I110 Hypertensive heart disease with heart failure: Secondary | ICD-10-CM | POA: Diagnosis not present

## 2019-02-24 DIAGNOSIS — R1013 Epigastric pain: Secondary | ICD-10-CM

## 2019-02-24 DIAGNOSIS — Z1211 Encounter for screening for malignant neoplasm of colon: Secondary | ICD-10-CM | POA: Diagnosis not present

## 2019-02-24 DIAGNOSIS — I5020 Unspecified systolic (congestive) heart failure: Secondary | ICD-10-CM | POA: Diagnosis not present

## 2019-02-24 DIAGNOSIS — K5730 Diverticulosis of large intestine without perforation or abscess without bleeding: Secondary | ICD-10-CM | POA: Diagnosis not present

## 2019-02-24 DIAGNOSIS — I429 Cardiomyopathy, unspecified: Secondary | ICD-10-CM | POA: Insufficient documentation

## 2019-02-24 DIAGNOSIS — Z79899 Other long term (current) drug therapy: Secondary | ICD-10-CM | POA: Diagnosis not present

## 2019-02-24 DIAGNOSIS — Z800 Family history of malignant neoplasm of digestive organs: Secondary | ICD-10-CM | POA: Diagnosis not present

## 2019-02-24 DIAGNOSIS — R1011 Right upper quadrant pain: Secondary | ICD-10-CM

## 2019-02-24 DIAGNOSIS — R109 Unspecified abdominal pain: Secondary | ICD-10-CM

## 2019-02-24 DIAGNOSIS — Z993 Dependence on wheelchair: Secondary | ICD-10-CM | POA: Insufficient documentation

## 2019-02-24 DIAGNOSIS — G4730 Sleep apnea, unspecified: Secondary | ICD-10-CM | POA: Insufficient documentation

## 2019-02-24 HISTORY — PX: COLONOSCOPY WITH PROPOFOL: SHX5780

## 2019-02-24 SURGERY — COLONOSCOPY WITH PROPOFOL
Anesthesia: Monitor Anesthesia Care

## 2019-02-24 MED ORDER — PROPOFOL 10 MG/ML IV BOLUS
INTRAVENOUS | Status: AC
  Filled 2019-02-24: qty 60

## 2019-02-24 MED ORDER — LIDOCAINE 2% (20 MG/ML) 5 ML SYRINGE
INTRAMUSCULAR | Status: DC | PRN
  Administered 2019-02-24: 100 mg via INTRAVENOUS

## 2019-02-24 MED ORDER — SODIUM CHLORIDE 0.9 % IV SOLN: INTRAVENOUS | Status: DC

## 2019-02-24 MED ORDER — PHENYLEPHRINE 40 MCG/ML (10ML) SYRINGE FOR IV PUSH (FOR BLOOD PRESSURE SUPPORT)
PREFILLED_SYRINGE | INTRAVENOUS | Status: DC | PRN
  Administered 2019-02-24 (×2): 80 ug via INTRAVENOUS

## 2019-02-24 MED ORDER — PROPOFOL 10 MG/ML IV BOLUS
INTRAVENOUS | Status: DC | PRN
  Administered 2019-02-24: 20 mg via INTRAVENOUS
  Administered 2019-02-24: 30 mg via INTRAVENOUS

## 2019-02-24 MED ORDER — PROPOFOL 500 MG/50ML IV EMUL
INTRAVENOUS | Status: DC | PRN
  Administered 2019-02-24: 100 ug/kg/min via INTRAVENOUS

## 2019-02-24 MED ORDER — LACTATED RINGERS IV SOLN
INTRAVENOUS | Status: DC | PRN
  Administered 2019-02-24: 11:00:00 via INTRAVENOUS

## 2019-02-24 SURGICAL SUPPLY — 21 items

## 2019-02-24 NOTE — Telephone Encounter (Signed)
-----   Message from Milus Banister, MD sent at 02/24/2019 11:52 AM EDT ----- Marykay Lex, She needs recall colonoscoyp in 5 years for FH of colon cancer. Also she needs a CT scan abd/pelvis with IV and oral contrast for epigastric and right sided abd pains.   Thanks

## 2019-02-24 NOTE — Discharge Instructions (Signed)

## 2019-02-24 NOTE — Op Note (Signed)
[facility] Patient Name: Briana Miller Procedure Date: 02/24/2019 MRN: [id number] Attending MD: Rachael Fee , MD Date of Birth: 1955-10-16 CSN: [id number] Age: 63 Admit Type: Outpatient Procedure:                Colonoscopy Indications:              Screening in patient at increased risk: Family                            history of 1st-degree relative with colorectal                            cancer before age 93 years; Family history of colon                            cancer; mother in her early 2s; colonoscopy Dr.                            Christella Hartigan 12/2014found diverticulosis, no polyps,                            recommended recall at 5 years. Providers:                Rachael Fee, MD, Dwain Sarna, RN, Arlee Muslim Tech., Technician, Marc Morgans,                            Technician, Steffanie Dunn CRNA Referring MD:              Medicines:                Monitored Anesthesia Care Complications:            No immediate complications. Estimated blood loss:                            None. Estimated Blood Loss:     Estimated blood loss: none. Procedure:                Pre-Anesthesia Assessment:                           - Prior to the procedure, a History and Physical                            was performed, and patient medications and                            allergies were reviewed. The patient's tolerance of                            previous anesthesia was also reviewed. The risks                            and benefits of the  procedure and the sedation                            options and risks were discussed with the patient.                            All questions were answered, and informed consent                            was obtained. Prior Anticoagulants: The patient has                            taken no previous anticoagulant or antiplatelet                            agents. ASA Grade  Assessment: III - A patient with                            severe systemic disease. After reviewing the risks                            and benefits, the patient was deemed in                            satisfactory condition to undergo the procedure.                           After obtaining informed consent, the colonoscope                            was passed under direct vision. Throughout the                            procedure, the patient's blood pressure, pulse, and                            oxygen saturations were monitored continuously. The                            CF-HQ190L ([id number] Olympus colonoscope was                            introduced through the anus and advanced to the the                            cecum, identified by appendiceal orifice and                            ileocecal valve. The colonoscopy was performed                            without difficulty. The patient tolerated the  procedure well. The quality of the bowel                            preparation was good. Scope In: 11:28:19 AM Scope Out: 11:40:48 AM Scope Withdrawal Time: 0 hours 8 minutes 40 seconds  Total Procedure Duration: 0 hours 12 minutes 29 seconds  Findings:      Multiple small-mouthed diverticula were found in the entire colon.      The exam was otherwise without abnormality on direct and retroflexion       views. Impression:               - Diverticulosis in the entire examined colon.                           - The examination was otherwise normal on direct                            and retroflexion views.                           - No polyps or cancers. Moderate Sedation:      Not Applicable - Patient had care per Anesthesia. Recommendation:           - Patient has a contact number available for                            emergencies. The signs and symptoms of potential                            delayed complications were discussed with the                             patient. Return to normal activities tomorrow.                            Written discharge instructions were provided to the                            patient.                           - Resume previous diet.                           - Continue present medications.                           - Repeat colonoscopy in 5 years for screening.                           - My office will arrange CT scan abd/pelvis for                            your epigastric and right sided abdominal pains. Procedure Code(s):        --- Professional ---  [id number], Colonoscopy, flexible; diagnostic, including                            collection of specimen(s) by brushing or washing,                            when performed (separate procedure) Diagnosis Code(s):        --- Professional ---                           Z80.0, Family history of malignant neoplasm of                            digestive organs                           K57.30, Diverticulosis of large intestine without                            perforation or abscess without bleeding CPT copyright 2019 American Medical Association. All rights reserved. The codes documented in this report are preliminary and upon coder review may  be revised to meet current compliance requirements. Rachael Feeaniel P Jacobs, MD 02/24/2019 11:51:09 AM This report has been signed electronically. Number of Addenda: 0

## 2019-02-24 NOTE — Transfer of Care (Signed)
Immediate Anesthesia Transfer of Care Note  Patient: Bridey Brookover  Procedure(s) Performed: COLONOSCOPY WITH PROPOFOL (N/A )  Patient Location: Endoscopy Unit  Anesthesia Type:MAC  Level of Consciousness: awake, alert , oriented and patient cooperative  Airway & Oxygen Therapy: Patient Spontanous Breathing and Patient connected to face mask oxygen  Post-op Assessment: Report given to RN, Post -op Vital signs reviewed and stable and Patient moving all extremities  Post vital signs: Reviewed and stable  Last Vitals:  Vitals Value Taken Time  BP    Temp    Pulse 73 02/24/19 1149  Resp 22 02/24/19 1149  SpO2 96 % 02/24/19 1149  Vitals shown include unvalidated device data.  Last Pain:  Vitals:   02/24/19 1100  TempSrc: Oral  PainSc: 0-No pain         Complications: No apparent anesthesia complications

## 2019-02-24 NOTE — Telephone Encounter (Signed)
Spoke to patient. Recall colonoscopy  has been placed in the system for 02/2024. CT has been scheduled for 03/18/19. Patient knows to come by the office about a week before her CT to pick up the oral contrast and go to the basement for labs(BUN/CREAT) All questions answered, patient voiced understanding.

## 2019-02-24 NOTE — Interval H&P Note (Signed)
History and Physical Interval Note:  02/24/2019 11:09 AM  Briana Miller  has presented today for surgery, with the diagnosis of abdominal pain Family HX colon cancer.  The various methods of treatment have been discussed with the patient and family. After consideration of risks, benefits and other options for treatment, the patient has consented to  Procedure(s): COLONOSCOPY WITH PROPOFOL (N/A) as a surgical intervention.  The patient's history has been reviewed, patient examined, no change in status, stable for surgery.  I have reviewed the patient's chart and labs.  Questions were answered to the patient's satisfaction.     Milus Banister

## 2019-02-24 NOTE — Anesthesia Preprocedure Evaluation (Addendum)
Anesthesia Evaluation  Patient identified by MRN, date of birth, ID band Patient awake    Reviewed: Allergy & Precautions, NPO status , Patient's Chart, lab work & pertinent test results  Airway Mallampati: II  TM Distance: >3 FB Neck ROM: Full    Dental no notable dental hx.    Pulmonary sleep apnea and Continuous Positive Airway Pressure Ventilation ,    Pulmonary exam normal breath sounds clear to auscultation       Cardiovascular hypertension, +CHF  Normal cardiovascular exam Rhythm:Regular Rate:Normal     Neuro/Psych negative neurological ROS  negative psych ROS   GI/Hepatic Neg liver ROS, GERD  ,  Endo/Other  negative endocrine ROS  Renal/GU negative Renal ROS  negative genitourinary   Musculoskeletal  (+) Arthritis ,   Abdominal   Peds  Hematology negative hematology ROS (+)   Anesthesia Other Findings   Reproductive/Obstetrics                            Anesthesia Physical Anesthesia Plan  ASA: III  Anesthesia Plan: MAC   Post-op Pain Management:    Induction: Intravenous  PONV Risk Score and Plan: Propofol infusion and Treatment may vary due to age or medical condition  Airway Management Planned: Natural Airway  Additional Equipment:   Intra-op Plan:   Post-operative Plan:   Informed Consent: I have reviewed the patients History and Physical, chart, labs and discussed the procedure including the risks, benefits and alternatives for the proposed anesthesia with the patient or authorized representative who has indicated his/her understanding and acceptance.     Dental advisory given  Plan Discussed with: CRNA  Anesthesia Plan Comments:         Anesthesia Quick Evaluation

## 2019-02-25 NOTE — Anesthesia Postprocedure Evaluation (Signed)
Anesthesia Post Note  Patient: Briana Miller  Procedure(s) Performed: COLONOSCOPY WITH PROPOFOL (N/A )     Patient location during evaluation: Endoscopy Anesthesia Type: General Level of consciousness: awake and alert Pain management: pain level controlled Vital Signs Assessment: post-procedure vital signs reviewed and stable Respiratory status: spontaneous breathing, nonlabored ventilation, respiratory function stable and patient connected to nasal cannula oxygen Cardiovascular status: blood pressure returned to baseline and stable Postop Assessment: no apparent nausea or vomiting Anesthetic complications: no    Last Vitals:  Vitals:   02/24/19 1149 02/24/19 1200  BP: 120/63 134/61  Pulse: 74 80  Resp: 18 20  Temp: 36.8 C   SpO2: 99% 97%    Last Pain:  Vitals:   02/24/19 1149  TempSrc: Temporal  PainSc: 0-No pain                 Briana Miller

## 2019-03-01 ENCOUNTER — Other Ambulatory Visit: Payer: Self-pay | Admitting: Family

## 2019-03-01 DIAGNOSIS — Z1231 Encounter for screening mammogram for malignant neoplasm of breast: Secondary | ICD-10-CM

## 2019-03-04 ENCOUNTER — Ambulatory Visit: Payer: Medicare Other | Admitting: Family

## 2019-03-11 ENCOUNTER — Other Ambulatory Visit: Payer: Self-pay

## 2019-03-11 ENCOUNTER — Ambulatory Visit (INDEPENDENT_AMBULATORY_CARE_PROVIDER_SITE_OTHER): Payer: Medicare Other | Admitting: Cardiology

## 2019-03-11 ENCOUNTER — Ambulatory Visit: Payer: Medicare Other | Admitting: Cardiology

## 2019-03-11 ENCOUNTER — Ambulatory Visit: Payer: Medicare Other | Admitting: Family

## 2019-03-11 ENCOUNTER — Encounter: Payer: Self-pay | Admitting: Cardiology

## 2019-03-11 VITALS — BP 112/68 | HR 66 | Temp 97.2°F | Ht 66.0 in | Wt 240.0 lb

## 2019-03-11 DIAGNOSIS — I10 Essential (primary) hypertension: Secondary | ICD-10-CM

## 2019-03-11 DIAGNOSIS — I420 Dilated cardiomyopathy: Secondary | ICD-10-CM

## 2019-03-11 DIAGNOSIS — Z01818 Encounter for other preprocedural examination: Secondary | ICD-10-CM | POA: Diagnosis not present

## 2019-03-11 DIAGNOSIS — G4733 Obstructive sleep apnea (adult) (pediatric): Secondary | ICD-10-CM

## 2019-03-11 DIAGNOSIS — I255 Ischemic cardiomyopathy: Secondary | ICD-10-CM

## 2019-03-11 DIAGNOSIS — R5381 Other malaise: Secondary | ICD-10-CM

## 2019-03-11 NOTE — Patient Instructions (Signed)
Medication Instructions:  Your physician recommends that you continue on your current medications as directed. Please refer to the Current Medication list given to you today. If you need a refill on your cardiac medications before your next appointment, please call your pharmacy.   Lab work: None  If you have labs (blood work) drawn today and your tests are completely normal, you will receive your results only by: . MyChart Message (if you have MyChart) OR . A paper copy in the mail If you have any lab test that is abnormal or we need to change your treatment, we will call you to review the results.  Testing/Procedures: None   Follow-Up: At [facility], you and your health needs are our priority.  As part of our continuing mission to provide you with exceptional heart care, we have created designated Provider Care Teams.  These Care Teams include your primary Cardiologist (physician) and Advanced Practice Providers (APPs -  Physician Assistants and Nurse Practitioners) who all work together to provide you with the care you need, when you need it. You will need a follow up appointment in 12 months.  Please call our office 2 months in advance to schedule this appointment.  You may see Brian Crenshaw, MD or one of the following Advanced Practice Providers on your designated Care Team:   Luke Kilroy, PA-C Krista Kroeger, PA-C . Callie Goodrich, PA-C  Any Other Special Instructions Will Be Listed Below (If Applicable).   

## 2019-03-11 NOTE — Assessment & Plan Note (Signed)
Sleep study not done secondary to COVID

## 2019-03-11 NOTE — Assessment & Plan Note (Signed)
Seen today for pre op clearance for Rt TKR

## 2019-03-11 NOTE — Assessment & Plan Note (Signed)
Most recent EF 45-50%- no CHF on exam

## 2019-03-11 NOTE — Assessment & Plan Note (Signed)
Wheel chair bound secondary to obesity, DJD in knees and C-spine

## 2019-03-11 NOTE — Progress Notes (Signed)
Cardiology Office Note:    Date:  03/11/2019   ID:  Briana Miller, DOB 1956/04/24, MRN [id number]  PCP:  Sandford Craze, NP  Cardiologist:  Olga Millers, MD  Electrophysiologist:  None   Referring MD: Sandford Craze, NP   Chief Complaint  Patient presents with  . Follow-up    6 months/clearance.  Marland Kitchen Headache  . Edema  pre op clearance for lt TKR  History of Present Illness:    Briana Miller is a pleasant morbidly obese 63 y.o. AA female with a hx of NICM and C-spine DJD.  Her most recent echo Feb 2018 showed her EF to be 45-50%.  She does not have symptoms of CHF.  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  She was scheduled to have a sleep study earlier this year but this was put off and not yet rescheduled secondary to COVID.   She is in the office today to be cleared for Lt TKR (Dr Lequita Halt).  She has previously had Rt TKR.  She denies any chest pain or unusual dyspnea.  Myoview 2014 was low risk.   Past Medical History:  Diagnosis Date  . Acute systolic congestive heart failure (HCC) 03/01/2014  . Allergy   . Anemia   . Arthritis   . Cardiomyopathy (HCC)   . Cervical myelopathy (HCC)   . CHF (congestive heart failure) (HCC)   . GERD (gastroesophageal reflux disease)   . Heart disease   . Hyperlipidemia   . Hypertension   . Leg pain   . Overactive bladder     Past Surgical History:  Procedure Laterality Date  . ANTERIOR CERVICAL DECOMP/DISCECTOMY FUSION N/A 08/03/2016   Procedure: ANTERIOR CERVICAL DECOMPRESSION/DISCECTOMY FUSION C4-5, C5-6;  Surgeon: Barnett Abu, MD;  Location: [facility];  Service: Neurosurgery;  Laterality: N/A;  . COLONOSCOPY    . COLONOSCOPY WITH PROPOFOL N/A 02/24/2019   Procedure: COLONOSCOPY WITH PROPOFOL;  Surgeon: Rachael Fee, MD;  Location: [facility]COPY;  Service: Endoscopy;  Laterality: N/A;  . KNEE ARTHROSCOPY     2007  . ROTATOR CUFF REPAIR  Right 03/23/13  . TOTAL KNEE ARTHROPLASTY  09/22/2011   Procedure: TOTAL KNEE ARTHROPLASTY;  Surgeon: Raymon Mutton, MD;  Location: [facility] OR;  Service: Orthopedics;  Laterality: Right;    Current Medications: Current Meds  Medication Sig  . calcium-vitamin D (OSCAL WITH D) 500-200 MG-UNIT per tablet Take 1 tablet by mouth daily.  Marland Kitchen gabapentin (NEURONTIN) 300 MG capsule Take 3 capsules (900 mg total) by mouth 3 (three) times daily.  Marland Kitchen latanoprost (XALATAN) 0.005 % ophthalmic solution Place 1 drop into both eyes at bedtime.  Marland Kitchen losartan (COZAAR) 100 MG tablet Take 1 tablet (100 mg total) by mouth daily.  . Melatonin 3 MG TABS Take 1 tablet by mouth at bedtime.  . metoprolol succinate (TOPROL-XL) 50 MG 24 hr tablet Take 1 tablet (50 mg total) by mouth daily. Take with or immediately following a meal.  . nystatin (MYCOSTATIN/NYSTOP) powder Apply topically 2 (two) times daily. (Patient taking differently: Apply 1 Bottle topically 2 (two) times daily as needed (irritation). )  . omeprazole (PRILOSEC) 40 MG capsule Take 1 capsule (40 mg total) by mouth daily.  . potassium chloride SA (K-DUR) 20 MEQ tablet Take 1 tablet (20 mEq total) by mouth daily.  . timolol (BETIMOL) 0.5 % ophthalmic solution Place 1 drop into both eyes every morning.  . torsemide (DEMADEX) 10  MG tablet Take one tablet alternating with 2 tablets every other day (Patient taking differently: Take 10-20 mg by mouth See admin instructions. Take one tablet alternating with 2 tablets every other day)     Allergies:   Patient has no known allergies.   Social History   Socioeconomic History  . Marital status: Married    Spouse name: Not on file  . Number of children: 2  . Years of education: HS  . Highest education level: Not on file  Occupational History  . Occupation: Psychiatric nurse: [employer]  . Financial resource strain: Not on file  . Food insecurity    Worry: Not on file    Inability: Not on file   . Transportation needs    Medical: Not on file    Non-medical: Not on file  Tobacco Use  . Smoking status: Never Smoker  . Smokeless tobacco: Never Used  Substance and Sexual Activity  . Alcohol use: Yes    Alcohol/week: 2.0 standard drinks    Types: 2 Glasses of wine per week  . Drug use: No  . Sexual activity: Not on file  Lifestyle  . Physical activity    Days per week: Not on file    Minutes per session: Not on file  . Stress: Not on file  Relationships  . Social Herbalist on phone: Not on file    Gets together: Not on file    Attends religious service: Not on file    Active member of club or organization: Not on file    Attends meetings of clubs or organizations: Not on file    Relationship status: Not on file  Other Topics Concern  . 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   1-2 cups caffeine daily.   Lives at home with her husband.     Family History: The patient's family history includes Asthma in her father; Cancer in her mother; Colon cancer in her mother; Diabetes in her father; Glaucoma in her father; Heart attack (age of onset: 53) in her father; Hypertension in her father; Kidney failure in her father. There is no history of Esophageal cancer, Stomach cancer, or Rectal cancer.  ROS:   Please see the history of present illness.     All other systems reviewed and are negative.  EKGs/Labs/Other Studies Reviewed:    The following studies were reviewed today: Echo Feb 2018  EKG:  EKG is ordered today.  The ekg ordered today demonstrates NSR, LVH, NSST  Recent Labs: 01/21/2019: ALT 11; BUN 25; Creatinine, Ser 0.99; Potassium 4.1; Sodium 141  Recent Lipid Panel    Component Value  Date/Time   CHOL 187 01/21/2019 1350   TRIG 119.0 01/21/2019 1350   HDL 53.50 01/21/2019 1350   CHOLHDL 3 01/21/2019 1350   VLDL 23.8 01/21/2019 1350   LDLCALC 109 (H) 01/21/2019 1350    Physical Exam:    VS:  BP 112/68 (BP Location: Left Arm, Patient Position: Sitting, Cuff Size: Large)   Pulse 66   Temp (!) 97.2 F (36.2 C)   Ht 5\' 6"  (1.676 m)   Wt  240 lb (108.9 kg)   LMP 06/17/2007   BMI 38.74 kg/m     Wt Readings from Last 3 Encounters:  03/11/19 240 lb (108.9 kg)  01/21/19 249 lb (112.9 kg)  08/25/18 240 lb (108.9 kg)     GEN: morbidly obese AA female in wheel chair-well developed in no acute distress HEENT: Normal NECK: No JVD; No carotid bruits LYMPHATICS: No lymphadenopathy CARDIAC: RRR, no murmurs, rubs, gallops RESPIRATORY:  Clear to auscultation without rales, wheezing or rhonchi  ABDOMEN: Soft, non-tender, non-distended MUSCULOSKELETAL: 1+ chronic edema edema; No deformity  SKIN: Warm and dry NEUROLOGIC:  Alert and oriented x 3 PSYCHIATRIC:  Normal affect   ASSESSMENT:    Pre-operative clearance Seen today for pre op clearance for Rt TKR  Congestive dilated cardiomyopathy (HCC) Most recent EF 45-50%- no CHF on exam  Severe obesity (BMI >= 40) BMI is down to 38  OSA (obstructive sleep apnea) Sleep study not done secondary to COVID  HTN (hypertension) Controlled  Debility Wheel chair bound secondary to obesity, DJD in knees and C-spine  PLAN:    From a cardiac standpoint the patient is an acceptable risk for proposed surgery without further cardiac work up.  Formal clearance to be forwarded when we receive official request.   Medication Adjustments/Labs and Tests Ordered: Current medicines are reviewed at length with the patient today.  Concerns regarding medicines are outlined above.  Orders Placed This Encounter  Procedures  . EKG 12-Lead   No orders of the defined types were placed in this encounter.   Patient Instructions   Medication Instructions:  Your physician recommends that you continue on your current medications as directed. Please refer to the Current Medication list given to you today. If you need a refill on your cardiac medications before your next appointment, please call your pharmacy.   Lab work: None  If you have labs (blood work) drawn today and your tests are completely normal, you will receive your results only by: Marland Kitchen MyChart Message (if you have MyChart) OR . A paper copy in the mail If you have any lab test that is abnormal or we need to change your treatment, we will call you to review the results.  Testing/Procedures: None   Follow-Up: At [facility], you and your health needs are our priority.  As part of our continuing mission to provide you with exceptional heart care, we have created designated Provider Care Teams.  These Care Teams include your primary Cardiologist (physician) and Advanced Practice Providers (APPs -  Physician Assistants and Nurse Practitioners) who all work together to provide you with the care you need, when you need it. You will need a follow up appointment in 12 months.  Please call our office 2 months in advance to schedule this appointment.  You may see Olga Millers, MD or one of the following Advanced Practice Providers on your designated Care Team:   Corine Shelter, PA-C Judy Pimple, [place] . Marjie Skiff, PA-C  Any Other Special Instructions Will Be Listed Below (If Applicable).      Signed, Corine Shelter, PA-C  03/11/2019 11:22 AM    [facility]

## 2019-03-11 NOTE — Assessment & Plan Note (Signed)
Controlled.  

## 2019-03-11 NOTE — Assessment & Plan Note (Signed)
BMI is down to 38

## 2019-03-18 ENCOUNTER — Inpatient Hospital Stay: Admission: RE | Admit: 2019-03-18 | Payer: Medicare Other | Source: Ambulatory Visit

## 2019-03-23 ENCOUNTER — Telehealth: Payer: Self-pay | Admitting: Cardiology

## 2019-03-23 NOTE — Telephone Encounter (Signed)
New Message     Pt c/o medication issue 1. Name of Medication: Torsemide   2. How are you currently taking this medication (dosage and times per day)? 10mg  Take one tablet alternating with 2 tablets every other day  3. Are you having a reaction (difficulty breathing--STAT)? No  4. What is your medication issue? Patient states medication is not covered by insurance needs an alternative.

## 2019-03-23 NOTE — Telephone Encounter (Signed)
Spoke with patient who reports her insurance will not cover torsemide. Was last refilled by PCP.   Phone: [phone]   Routed to primary to address

## 2019-03-24 NOTE — Telephone Encounter (Signed)
Spoke with Briana Miller, she got a letter from insurance reporting the torsemide would not be covered. According to [facility] the medication is covered with no PA needed. Patient made aware.

## 2019-04-01 ENCOUNTER — Ambulatory Visit: Payer: Medicare Other | Admitting: Family

## 2019-04-07 ENCOUNTER — Other Ambulatory Visit: Payer: Self-pay

## 2019-04-08 ENCOUNTER — Other Ambulatory Visit (HOSPITAL_BASED_OUTPATIENT_CLINIC_OR_DEPARTMENT_OTHER): Payer: Medicare Other

## 2019-04-08 ENCOUNTER — Encounter: Payer: Self-pay | Admitting: Family

## 2019-04-08 ENCOUNTER — Ambulatory Visit (INDEPENDENT_AMBULATORY_CARE_PROVIDER_SITE_OTHER): Payer: Medicare Other | Admitting: Family

## 2019-04-08 ENCOUNTER — Telehealth: Payer: Self-pay | Admitting: Family

## 2019-04-08 DIAGNOSIS — Z23 Encounter for immunization: Secondary | ICD-10-CM | POA: Diagnosis not present

## 2019-04-08 DIAGNOSIS — R601 Generalized edema: Secondary | ICD-10-CM

## 2019-04-08 DIAGNOSIS — R059 Cough, unspecified: Secondary | ICD-10-CM

## 2019-04-08 DIAGNOSIS — L819 Disorder of pigmentation, unspecified: Secondary | ICD-10-CM

## 2019-04-08 DIAGNOSIS — R1013 Epigastric pain: Secondary | ICD-10-CM | POA: Diagnosis not present

## 2019-04-08 DIAGNOSIS — R05 Cough: Secondary | ICD-10-CM | POA: Diagnosis not present

## 2019-04-08 DIAGNOSIS — I420 Dilated cardiomyopathy: Secondary | ICD-10-CM

## 2019-04-08 LAB — LIPASE: Lipase: 37 U/L (ref 11.0–59.0)

## 2019-04-08 LAB — HEPATIC FUNCTION PANEL
ALT: 13 U/L (ref 0–35)
AST: 16 U/L (ref 0–37)
Albumin: 4.2 g/dL (ref 3.5–5.2)
Alkaline Phosphatase: 87 U/L (ref 39–117)
Bilirubin, Direct: 0.1 mg/dL (ref 0.0–0.3)
Total Bilirubin: 0.6 mg/dL (ref 0.2–1.2)
Total Protein: 7.2 g/dL (ref 6.0–8.3)

## 2019-04-08 LAB — CBC WITH DIFFERENTIAL/PLATELET
Basophils Absolute: 0 10*3/uL (ref 0.0–0.1)
Basophils Relative: 0.5 % (ref 0.0–3.0)
Eosinophils Absolute: 0.1 10*3/uL (ref 0.0–0.7)
Eosinophils Relative: 0.8 % (ref 0.0–5.0)
HCT: 35.9 % — ABNORMAL LOW (ref 36.0–46.0)
Hemoglobin: 11.9 g/dL — ABNORMAL LOW (ref 12.0–15.0)
Lymphocytes Relative: 35.5 % (ref 12.0–46.0)
Lymphs Abs: 2.1 10*3/uL (ref 0.7–4.0)
MCHC: 33.2 g/dL (ref 30.0–36.0)
MCV: 84.8 fl (ref 78.0–100.0)
Monocytes Absolute: 0.9 10*3/uL (ref 0.1–1.0)
Monocytes Relative: 15 % — ABNORMAL HIGH (ref 3.0–12.0)
Neutro Abs: 2.9 10*3/uL (ref 1.4–7.7)
Neutrophils Relative %: 48.2 % (ref 43.0–77.0)
Platelets: 269 10*3/uL (ref 150.0–400.0)
RBC: 4.23 Mil/uL (ref 3.87–5.11)
RDW: 14.6 % (ref 11.5–15.5)
WBC: 6 10*3/uL (ref 4.0–10.5)

## 2019-04-08 LAB — BASIC METABOLIC PANEL
BUN: 29 mg/dL — ABNORMAL HIGH (ref 6–23)
CO2: 24 mEq/L (ref 19–32)
Calcium: 9.2 mg/dL (ref 8.4–10.5)
Chloride: 105 mEq/L (ref 96–112)
Creatinine, Ser: 1.11 mg/dL (ref 0.40–1.20)
GFR: 60.41 mL/min (ref >60.00)
Glucose, Bld: 144 mg/dL — ABNORMAL HIGH (ref 70–99)
Potassium: 3.9 mEq/L (ref 3.5–5.1)
Sodium: 140 mEq/L (ref 135–145)

## 2019-04-08 MED ORDER — LOSARTAN POTASSIUM 100 MG PO TABS: 50.0000 mg | ORAL_TABLET | Freq: Every day | ORAL | 1 refills | Status: DC

## 2019-04-08 MED ORDER — TORSEMIDE 10 MG PO TABS: ORAL_TABLET | ORAL | 1 refills | Status: DC

## 2019-04-08 NOTE — Telephone Encounter (Signed)
Forms placed on provider's folder to be filled out

## 2019-04-08 NOTE — Progress Notes (Signed)
Subjective:    Patient ID: Briana Miller, female    DOB: [birth year], 63 y.o.   MRN: [id number]  HPI  Patient is a 63 yr old female who presents today with two concerns:  1) Skin discoloration- reports discoloration on her face.  2) Abdominal pain-  Reports + epigastric pain as well as some "sharp pains in my belly button"- occurred 3 times last week- none today. Epigastric pain seems to be increasing.  She saw Dr. Jeanine Luz  3) HTN- maintained on toprol xl 50mg , losartan 100mg , and demadex.   BP Readings from Last 3 Encounters:  04/08/19 (!) 96/56  03/11/19 112/68  02/24/19 134/61   Reports some cough in the early AM only for the last several months.  Doesn't occur every day.  Thought it might be allergies- tried sudafed and robitussin without improvement.    Review of Systems See HPI  Past Medical History:  Diagnosis Date  . Acute systolic congestive heart failure ([place]) 03/01/2014  . Allergy   . Anemia   . Arthritis   . Cardiomyopathy ([place])   . Cervical myelopathy ([place])   . CHF (congestive heart failure) ([place])   . GERD (gastroesophageal reflux disease)   . Heart disease   . Hyperlipidemia   . Hypertension   . Leg pain   . Overactive bladder      Social History   Socioeconomic History  . Marital status: Married    Spouse name: Not on file  . Number of children: 2  . Years of education: HS  . Highest education level: Not on file  Occupational History  . Occupation: Psychiatric nurse: [employer]  . Financial resource strain: Not on file  . Food insecurity    Worry: Not on file    Inability: Not on file  . Transportation needs    Medical: Not on file    Non-medical: Not on file  Tobacco Use  . Smoking status: Never Smoker  . Smokeless tobacco: Never Used  Substance and Sexual Activity  . Alcohol use: Yes    Alcohol/week: 2.0 standard drinks    Types: 2 Glasses of wine per week  . Drug use: No  . Sexual activity: Not on file   Lifestyle  . Physical activity    Days per week: Not on file    Minutes per session: Not on file  . Stress: Not on file  Relationships  . Social Herbalist on phone: Not on file    Gets together: Not on file    Attends religious service: Not on file    Active member of club or organization: Not on file    Attends meetings of clubs or organizations: Not on file    Relationship status: Not on file  . Intimate partner violence    Fear of current or ex partner: Not on file    Emotionally abused: Not on file    Physically abused: Not on file    Forced sexual activity: Not on file  Other Topics Concern  . 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   1-2 cups caffeine daily.  Lives at home with her husband.    Past Surgical History:  Procedure Laterality Date  . ANTERIOR CERVICAL DECOMP/DISCECTOMY FUSION N/A 08/03/2016   Procedure: ANTERIOR CERVICAL DECOMPRESSION/DISCECTOMY FUSION C4-5, C5-6;  Surgeon: Barnett Abu, MD;  Location: [facility] OR;  Service: Neurosurgery;  Laterality: N/A;  . COLONOSCOPY    . COLONOSCOPY WITH PROPOFOL N/A 02/24/2019   Procedure: COLONOSCOPY WITH PROPOFOL;  Surgeon: Rachael Fee, MD;  Location: [facility]COPY;  Service: Endoscopy;  Laterality: N/A;  . KNEE ARTHROSCOPY     2007  . ROTATOR CUFF REPAIR Right 03/23/13  . TOTAL KNEE ARTHROPLASTY  09/22/2011   Procedure: TOTAL KNEE ARTHROPLASTY;  Surgeon: Raymon Mutton, MD;  Location: [facility] OR;  Service: Orthopedics;  Laterality: Right;    Family History  Problem Relation Age of Onset  . Cancer Mother        colon 60 and breast 39- deceased  . Colon cancer Mother   . Diabetes Father        living  . Asthma Father   . Hypertension Father    . Heart attack Father 47       medical management per pt  . Glaucoma Father        had eye transplant  . Kidney failure Father        acute renal failure- died 67  . Esophageal cancer Neg Hx   . Stomach cancer Neg Hx   . Rectal cancer Neg Hx     No Known Allergies  Current Outpatient Medications on File Prior to Visit  Medication Sig Dispense Refill  . calcium-vitamin D (OSCAL WITH D) 500-200 MG-UNIT per tablet Take 1 tablet by mouth daily.    Marland Kitchen gabapentin (NEURONTIN) 300 MG capsule Take 3 capsules (900 mg total) by mouth 3 (three) times daily. 810 capsule 1  . latanoprost (XALATAN) 0.005 % ophthalmic solution Place 1 drop into both eyes at bedtime.  11  . losartan (COZAAR) 100 MG tablet Take 1 tablet (100 mg total) by mouth daily. 90 tablet 1  . Melatonin 3 MG TABS Take 1 tablet by mouth at bedtime.    . metoprolol succinate (TOPROL-XL) 50 MG 24 hr tablet Take 1 tablet (50 mg total) by mouth daily. Take with or immediately following a meal. 90 tablet 1  . nystatin (MYCOSTATIN/NYSTOP) powder Apply topically 2 (two) times daily. (Patient taking differently: Apply 1 Bottle topically 2 (two) times daily as needed (irritation). ) 30 g 5  . omeprazole (PRILOSEC) 40 MG capsule Take 1 capsule (40 mg total) by mouth daily. 90 capsule 1  . potassium chloride SA (K-DUR) 20 MEQ tablet Take 1 tablet (20 mEq total) by mouth daily. 90 tablet 1  . timolol (BETIMOL) 0.5 % ophthalmic solution Place 1 drop into both eyes every morning.    . torsemide (DEMADEX) 10 MG tablet Take one tablet alternating with 2 tablets every other day (Patient taking differently: Take 10-20 mg by mouth See admin instructions. Take one tablet alternating with 2 tablets every other day) 135 tablet 1   No current facility-administered medications on file prior to visit.     BP (!) 96/56 (BP Location: Right Arm, Patient Position: Sitting, Cuff Size: Large)   Pulse 82   Temp (!) 97 F (36.1 C) (Temporal)   Resp 16   Ht 5'  6" (1.676 m)   Wt 288 lb (130.6 kg)   LMP 06/17/2007   SpO2 100%   BMI 46.48 kg/m  Objective:   Physical Exam Constitutional:      Appearance: She is well-developed.  Cardiovascular:     Rate and Rhythm: Normal rate and regular rhythm.     Heart sounds: Normal heart sounds. No murmur.  Pulmonary:     Effort: Pulmonary effort is normal. No respiratory distress.     Breath sounds: Normal breath sounds. No wheezing.  Abdominal:     Tenderness: There is abdominal tenderness in the right upper quadrant and epigastric area. There is guarding. There is no rebound.  Skin:    Comments: Slightly irregular pigmentation noted on cheeks/forehead  Psychiatric:        Behavior: Behavior normal.        Thought Content: Thought content normal.        Judgment: Judgment normal.           Assessment & Plan:  Epigastric pain- check cbc,cmet, lipase.  Refer for [place] this evening.    Cough- will obtain chest x-ray.  ? Secondary to arb, ? gerd related.  HTN- bp overtreated. Will cut losartan in half.

## 2019-04-08 NOTE — Telephone Encounter (Signed)
Patient forgot to give forms to PCP to fill out for [employer] and a Handicap placard. Patient will pick up at next appointment in one week. Placed in provider tray.

## 2019-04-11 ENCOUNTER — Telehealth: Payer: Self-pay | Admitting: Family

## 2019-04-11 NOTE — Telephone Encounter (Signed)
Patient is calling to see if orders can be sent to [place] [phone].

## 2019-04-11 NOTE — Telephone Encounter (Signed)
Orders re-entered for chest xray and [place] per patient wants to have done at [facility] imagine,

## 2019-04-12 ENCOUNTER — Encounter: Payer: Self-pay | Admitting: Family

## 2019-04-14 NOTE — Telephone Encounter (Signed)
Forms ready for patient, I have been unable to reach her by phone. Forms mailed out to her home address today.

## 2019-04-15 ENCOUNTER — Ambulatory Visit: Payer: Medicare Other | Admitting: Family

## 2019-04-15 ENCOUNTER — Other Ambulatory Visit (HOSPITAL_BASED_OUTPATIENT_CLINIC_OR_DEPARTMENT_OTHER): Payer: Medicare Other

## 2019-04-15 ENCOUNTER — Ambulatory Visit (HOSPITAL_BASED_OUTPATIENT_CLINIC_OR_DEPARTMENT_OTHER): Payer: Medicare Other

## 2019-04-22 ENCOUNTER — Ambulatory Visit: Payer: Medicare Other

## 2019-05-02 ENCOUNTER — Other Ambulatory Visit: Payer: Self-pay | Admitting: Family

## 2019-05-02 ENCOUNTER — Ambulatory Visit (INDEPENDENT_AMBULATORY_CARE_PROVIDER_SITE_OTHER): Payer: Medicare Other | Admitting: Family

## 2019-05-02 ENCOUNTER — Other Ambulatory Visit: Payer: Self-pay

## 2019-05-02 ENCOUNTER — Encounter: Payer: Self-pay | Admitting: Family

## 2019-05-02 DIAGNOSIS — L304 Erythema intertrigo: Secondary | ICD-10-CM

## 2019-05-02 DIAGNOSIS — L80 Vitiligo: Secondary | ICD-10-CM

## 2019-05-02 MED ORDER — NYSTATIN 100000 UNIT/GM EX POWD: Freq: Two times a day (BID) | CUTANEOUS | 2 refills | Status: DC

## 2019-05-02 NOTE — Progress Notes (Signed)
Virtual Visit via Video Note  I connected with Briana Miller on 05/02/19 at 10:40 AM EST by a video enabled telemedicine application and verified that I am speaking with the correct person using two identifiers.  Location: Patient: home  Provider: work   I discussed the limitations of evaluation and management by telemedicine and the availability of in person appointments. The patient expressed understanding and agreed to proceed.  History of Present Illness:  Patient is a 63 yr old female who presents today with two concerns:  1) loss of pigment in the skin on her face  2) rash beneath the right breast- reports she ran out of nystatin powder.    Past Medical History:  Diagnosis Date  . Acute systolic congestive heart failure ([facility]) 03/01/2014  . Allergy   . Anemia   . Arthritis   . Cardiomyopathy ([facility])   . Cervical myelopathy ([facility])   . CHF (congestive heart failure) ([facility])   . GERD (gastroesophageal reflux disease)   . Heart disease   . Hyperlipidemia   . Hypertension   . Leg pain   . Overactive bladder      Social History   Socioeconomic History  . Marital status: Married    Spouse name: Not on file  . Number of children: 2  . Years of education: HS  . Highest education level: Not on file  Occupational History  . Occupation: Psychiatric nurse: [employer]  . Financial resource strain: Not on file  . Food insecurity    Worry: Not on file    Inability: Not on file  . Transportation needs    Medical: Not on file    Non-medical: Not on file  Tobacco Use  . Smoking status: Never Smoker  . Smokeless tobacco: Never Used  Substance and Sexual Activity  . Alcohol use: Yes    Alcohol/week: 2.0 standard drinks    Types: 2 Glasses of wine per week  . Drug use: No  . Sexual activity: Not on file  Lifestyle  . Physical activity    Days per week: Not on file    Minutes per session: Not on file  . Stress: Not on file  Relationships  . Social  Herbalist on phone: Not on file    Gets together: Not on file    Attends religious service: Not on file    Active member of club or organization: Not on file    Attends meetings of clubs or organizations: Not on file    Relationship status: Not on file  . Intimate partner violence    Fear of current or ex partner: Not on file    Emotionally abused: Not on file    Physically abused: Not on file    Forced sexual activity: Not on file  Other Topics Concern  . 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   1-2 cups caffeine daily.   Lives at home with her husband.    Past Surgical History:  Procedure Laterality Date  . ANTERIOR CERVICAL DECOMP/DISCECTOMY FUSION N/A 08/03/2016   Procedure: ANTERIOR CERVICAL DECOMPRESSION/DISCECTOMY FUSION C4-5, C5-6;  Surgeon: Kristeen Miss,  MD;  Location: [facility];  Service: Neurosurgery;  Laterality: N/A;  . COLONOSCOPY    . COLONOSCOPY WITH PROPOFOL N/A 02/24/2019   Procedure: COLONOSCOPY WITH PROPOFOL;  Surgeon: Rachael Fee, MD;  Location: [facility];  Service: Endoscopy;  Laterality: N/A;  . KNEE ARTHROSCOPY     2007  . ROTATOR CUFF REPAIR Right 03/23/13  . TOTAL KNEE ARTHROPLASTY  09/22/2011   Procedure: TOTAL KNEE ARTHROPLASTY;  Surgeon: Raymon Mutton, MD;  Location: [facility] OR;  Service: Orthopedics;  Laterality: Right;    Family History  Problem Relation Age of Onset  . Cancer Mother        colon 31 and breast 69- deceased  . Colon cancer Mother   . Diabetes Father        living  . Asthma Father   . Hypertension Father   . Heart attack Father 30       medical management per pt  . Glaucoma Father        had eye transplant  . Kidney failure Father        acute  renal failure- died 69  . Esophageal cancer Neg Hx   . Stomach cancer Neg Hx   . Rectal cancer Neg Hx     No Known Allergies  Current Outpatient Medications on File Prior to Visit  Medication Sig Dispense Refill  . calcium-vitamin D (OSCAL WITH D) 500-200 MG-UNIT per tablet Take 1 tablet by mouth daily.    Marland Kitchen gabapentin (NEURONTIN) 300 MG capsule Take 3 capsules (900 mg total) by mouth 3 (three) times daily. 810 capsule 1  . latanoprost (XALATAN) 0.005 % ophthalmic solution Place 1 drop into both eyes at bedtime.  11  . losartan (COZAAR) 100 MG tablet Take 0.5 tablets (50 mg total) by mouth daily. 90 tablet 1  . Melatonin 3 MG TABS Take 1 tablet by mouth at bedtime.    . metoprolol succinate (TOPROL-XL) 50 MG 24 hr tablet Take 1 tablet (50 mg total) by mouth daily. Take with or immediately following a meal. 90 tablet 1  . omeprazole (PRILOSEC) 40 MG capsule Take 1 capsule (40 mg total) by mouth daily. 90 capsule 1  . potassium chloride SA (K-DUR) 20 MEQ tablet Take 1 tablet (20 mEq total) by mouth daily. 90 tablet 1  . timolol (BETIMOL) 0.5 % ophthalmic solution Place 1 drop into both eyes every morning.    . torsemide (DEMADEX) 10 MG tablet Take one tablet alternating with 2 tablets every other day 135 tablet 1   No current facility-administered medications on file prior to visit.     LMP 06/17/2007     Observations/Objective:   Gen: Awake, alert, no acute distress Resp: Breathing is even and non-labored Psych: calm/pleasant demeanor Neuro: Alert and Oriented x 3, + facial symmetry, speech is clear. Skin: Hyperpigmented skin noted beneath the right breast.  Patient's face- note is made of hypopigmented patches which are more noticeable now that the patient is not wearing makeup  Assessment and Plan:  Vitiligo- she would like to see dermatology. I see that we placed a referral last visit. I will ask our referral coordinator to check the status of this referral with the  dermatology office.  Intertrigo- advised pt to dry beneath breasts with hairdryer after bathing. Then apply nystatin powder generously bid.  If symptoms worsen, or if not improved in 1-2 weeks she is advised to let me know.   Follow Up Instructions:    I discussed  the assessment and treatment plan with the patient. The patient was provided an opportunity to ask questions and all were answered. The patient agreed with the plan and demonstrated an understanding of the instructions.   The patient was advised to call back or seek an in-person evaluation if the symptoms worsen or if the condition fails to improve as anticipated.  Nance Pear, NP

## 2019-05-02 NOTE — Telephone Encounter (Signed)
Medication Refill - Medication: nystatin (MYCOSTATIN/NYSTOP) powder    Preferred Pharmacy (with phone number or street name):  [facility] [place] [phone] (Phone) [phone] (Fax)          Agent: Please be advised that RX refills may take up to 3 business days. We ask that you follow-up with your pharmacy.

## 2019-05-20 ENCOUNTER — Other Ambulatory Visit (HOSPITAL_BASED_OUTPATIENT_CLINIC_OR_DEPARTMENT_OTHER): Payer: Medicare Other

## 2019-05-20 ENCOUNTER — Ambulatory Visit (HOSPITAL_BASED_OUTPATIENT_CLINIC_OR_DEPARTMENT_OTHER): Payer: Medicare Other

## 2019-06-01 ENCOUNTER — Telehealth: Payer: Self-pay

## 2019-06-01 NOTE — Telephone Encounter (Signed)
Copied from [place] [id number]. Topic: General - Other >> Jun 01, 2019  1:52 PM Keene Breath wrote: Reason for CRM: Patient called to schedule a virtual appt. Asap.  Tried the office but no answer.  Please call patient back at [phone]

## 2019-06-01 NOTE — Telephone Encounter (Signed)
Patient scheduled for virtual visit 06-03-19

## 2019-06-03 ENCOUNTER — Ambulatory Visit (INDEPENDENT_AMBULATORY_CARE_PROVIDER_SITE_OTHER): Payer: Medicare Other | Admitting: Family

## 2019-06-03 ENCOUNTER — Telehealth: Payer: Self-pay | Admitting: Family

## 2019-06-03 ENCOUNTER — Other Ambulatory Visit: Payer: Self-pay

## 2019-06-03 VITALS — BP 130/80 | HR 57

## 2019-06-03 DIAGNOSIS — K649 Unspecified hemorrhoids: Secondary | ICD-10-CM | POA: Diagnosis not present

## 2019-06-03 DIAGNOSIS — J329 Chronic sinusitis, unspecified: Secondary | ICD-10-CM

## 2019-06-03 MED ORDER — AMOXICILLIN-POT CLAVULANATE 875-125 MG PO TABS: 1.0000 | ORAL_TABLET | Freq: Two times a day (BID) | ORAL | 0 refills | Status: DC

## 2019-06-03 MED ORDER — MECLIZINE HCL 25 MG PO TABS: 25.0000 mg | ORAL_TABLET | Freq: Three times a day (TID) | ORAL | 0 refills | Status: DC | PRN

## 2019-06-03 MED ORDER — PROCTOFOAM HC 1-1 % EX FOAM: 1.0000 | Freq: Two times a day (BID) | CUTANEOUS | 1 refills | Status: DC

## 2019-06-03 MED ORDER — PRAMOXINE-HC 1-2.5 % EX CREA: TOPICAL_CREAM | Freq: Three times a day (TID) | CUTANEOUS | 0 refills | Status: DC

## 2019-06-03 MED ORDER — ONDANSETRON HCL 4 MG PO TABS: 4.0000 mg | ORAL_TABLET | Freq: Three times a day (TID) | ORAL | 0 refills | Status: DC | PRN

## 2019-06-03 NOTE — Telephone Encounter (Signed)
Pharamcy called stating the pts pramoxine-hydrocortisone cream    that was sent in today was not labeled with a percentage and they are needing to clarify. Please advise.   [id number] - [place], [place] - [address]  [address] [place] [address]  Phone: [phone] Fax: [phone]  Not a 24 hour pharmacy; exact hours not known.

## 2019-06-03 NOTE — Telephone Encounter (Signed)
Rx was corrected and sent

## 2019-06-03 NOTE — Progress Notes (Signed)
Virtual Visit via Video Note  I connected with Briana Miller on 06/03/19 at  2:00 PM EST by a video enabled telemedicine application and verified that I am speaking with the correct person using two identifiers.  Location: Patient: home Provider: home   I discussed the limitations of evaluation and management by telemedicine and the availability of in person appointments. The patient expressed understanding and agreed to proceed.  History of Present Illness:   Patient is a 63 yr old female who presents today with report of headaches. Reports that headaches occur when she has to bend down to pick up something or any type of fast movement.  Has been going on "for a while."   Has tried "HA pills" from [facility], tylenol, benadryl and sudafed and motion sickness pills OTC. No improvement with these measures. She denies nasal drainage. She reports that she has some dizziness with sudden headache. Denies ear pain.    Reports flare of hemorrhoids have been flaring. Reports that it has been going on for a month. She is using colace.     Past Medical History:  Diagnosis Date  . Acute systolic congestive heart failure ([facility]) 03/01/2014  . Allergy   . Anemia   . Arthritis   . Cardiomyopathy ([facility])   . Cervical myelopathy ([facility])   . CHF (congestive heart failure) ([facility])   . GERD (gastroesophageal reflux disease)   . Heart disease   . Hyperlipidemia   . Hypertension   . Leg pain   . Overactive bladder      Social History   Socioeconomic History  . Marital status: Married    Spouse name: Not on file  . Number of children: 2  . Years of education: HS  . Highest education level: Not on file  Occupational History  . 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 Smoking status: Never Smoker  . Smokeless tobacco: Never Used  Substance and Sexual Activity  . Alcohol use: Yes    Alcohol/week: 2.0 standard drinks    Types: 2 Glasses of wine per week  . Drug use: No  .  Sexual activity: Not on file  Other Topics Concern  . 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   1-2 cups caffeine daily.   Lives at home with her husband.   Social Determinants of Health   Financial Resource Strain:   . Difficulty of Paying Living Expenses: Not on file  Food Insecurity:   . Worried About Programme researcher, broadcasting/film/video in the Last Year: Not on file  . Ran Out of Food in the Last Year: Not on file  Transportation Needs:   . Lack of Transportation (Medical): Not on file  . Lack of Transportation (Non-Medical): Not on file  Physical Activity:   . Days of Exercise per Week: Not on file  . Minutes of Exercise per Session: Not on file  Stress:   . Feeling of Stress : Not on file  Social Connections:   . Frequency of Communication with Friends and Family: Not on file  . Frequency of Social Gatherings with Friends and Family: Not on file  . Attends Religious  Services: Not on file  . Active Member of Clubs or Organizations: Not on file  . Attends Archivist Meetings: Not on file  . Marital Status: Not on file  Intimate Partner Violence:   . Fear of Current or Ex-Partner: Not on file  . Emotionally Abused: Not on file  . Physically Abused: Not on file  . Sexually Abused: Not on file    Past Surgical History:  Procedure Laterality Date  . ANTERIOR CERVICAL DECOMP/DISCECTOMY FUSION N/A 08/03/2016   Procedure: ANTERIOR CERVICAL DECOMPRESSION/DISCECTOMY FUSION C4-5, C5-6;  Surgeon: Kristeen Miss, MD;  Location: [facility];  Service: Neurosurgery;  Laterality: N/A;  . COLONOSCOPY    . COLONOSCOPY WITH PROPOFOL N/A 02/24/2019   Procedure: COLONOSCOPY WITH PROPOFOL;  Surgeon: Milus Banister, MD;  [facility];  Service: Endoscopy;  Laterality: N/A;  . KNEE ARTHROSCOPY     2007  . ROTATOR CUFF REPAIR Right 03/23/13  . TOTAL KNEE ARTHROPLASTY  09/22/2011   Procedure: TOTAL KNEE ARTHROPLASTY;  Surgeon: Rudean Haskell, MD;  Location: [facility];  Service: Orthopedics;  Laterality: Right;    Family History  Problem Relation Age of Onset  . Cancer Mother        colon 24 and breast 46- deceased  . Colon cancer Mother   . Diabetes Father        living  . Asthma Father   . Hypertension Father   . Heart attack Father 44       medical management per pt  . Glaucoma Father        had eye transplant  . Kidney failure Father        acute renal failure- died 46  . Esophageal cancer Neg Hx   . Stomach cancer Neg Hx   . Rectal cancer Neg Hx     No Known Allergies  Current Outpatient Medications on File Prior to Visit  Medication Sig Dispense Refill  . calcium-vitamin D (OSCAL WITH D) 500-200 MG-UNIT per tablet Take 1 tablet by mouth daily.    Marland Kitchen gabapentin (NEURONTIN) 300 MG capsule Take 3 capsules (900 mg total) by mouth 3 (three) times daily. 810 capsule 1  . latanoprost (XALATAN) 0.005 % ophthalmic solution Place 1 drop into both eyes at bedtime.  11  . losartan (COZAAR) 100 MG tablet Take 0.5 tablets (50 mg total) by mouth daily. 90 tablet 1  . Melatonin 3 MG TABS Take 1 tablet by mouth at bedtime.    . metoprolol succinate (TOPROL-XL) 50 MG 24 hr tablet Take 1 tablet (50 mg total) by mouth daily. Take with or immediately following a meal. 90 tablet 1  . nystatin (MYCOSTATIN/NYSTOP) powder Apply topically 2 (two) times daily. 60 g 2  . omeprazole (PRILOSEC) 40 MG capsule Take 1 capsule (40 mg total) by mouth daily. 90 capsule 1  . potassium chloride SA (K-DUR) 20 MEQ tablet Take 1 tablet (20 mEq total) by mouth daily. 90 tablet 1  . timolol (BETIMOL) 0.5 % ophthalmic solution Place 1 drop into both eyes every morning.    . torsemide (DEMADEX) 10 MG tablet Take one tablet alternating with 2  tablets every other day 135 tablet 1   No current facility-administered medications on file prior to visit.    BP 130/80 (BP Location: Left Arm, Patient Position: Sitting)   Pulse (!) 57   LMP 06/17/2007    Observations/Objective:   Gen: Awake, alert, no acute distress Resp: Breathing is even and non-labored Psych:  calm/pleasant demeanor Neuro: Alert and Oriented x 3, + facial symmetry, speech is clear.   Assessment and Plan:  Hemorrhoids- uncontrolled.  recommended proctofoam HC, sitz baths with epsom salts bid.    Sinusitis- pt's HA and symptoms are most consistent with sinusitis.  Will rx with augmentin.  Pt is advised to call if symptoms worsen or if symptoms fail to improve.      Follow Up Instructions:    I discussed the assessment and treatment plan with the patient. The patient was provided an opportunity to ask questions and all were answered. The patient agreed with the plan and demonstrated an understanding of the instructions.   The patient was advised to call back or seek an in-person evaluation if the symptoms worsen or if the condition fails to improve as anticipated.  Lemont Fillers, NP

## 2019-06-06 ENCOUNTER — Encounter: Payer: Self-pay | Admitting: Family

## 2019-06-12 ENCOUNTER — Encounter: Payer: Self-pay | Admitting: Family

## 2019-06-24 ENCOUNTER — Other Ambulatory Visit: Payer: Self-pay

## 2019-06-24 ENCOUNTER — Ambulatory Visit
Admission: RE | Admit: 2019-06-24 | Discharge: 2019-06-24 | Disposition: A | Discharge status: Home / Self Care | Payer: Medicare Other | Source: Ambulatory Visit | Attending: Family | Admitting: Family

## 2019-06-24 DIAGNOSIS — Z1231 Encounter for screening mammogram for malignant neoplasm of breast: Secondary | ICD-10-CM

## 2019-06-29 ENCOUNTER — Other Ambulatory Visit: Payer: Self-pay | Admitting: Family

## 2019-07-01 MED ORDER — MECLIZINE HCL 25 MG PO TABS: 25.0000 mg | ORAL_TABLET | Freq: Three times a day (TID) | ORAL | 0 refills | Status: DC | PRN

## 2019-07-01 MED ORDER — ONDANSETRON HCL 4 MG PO TABS: 4.0000 mg | ORAL_TABLET | Freq: Three times a day (TID) | ORAL | 0 refills | Status: DC | PRN

## 2019-07-14 ENCOUNTER — Encounter: Payer: Self-pay | Admitting: Family

## 2019-07-14 DIAGNOSIS — R519 Headache, unspecified: Secondary | ICD-10-CM

## 2019-07-20 ENCOUNTER — Telehealth: Payer: Self-pay | Admitting: Gastroenterology

## 2019-07-20 DIAGNOSIS — R1013 Epigastric pain: Secondary | ICD-10-CM

## 2019-07-20 DIAGNOSIS — R1011 Right upper quadrant pain: Secondary | ICD-10-CM

## 2019-07-20 NOTE — Telephone Encounter (Signed)
Patient calling- stating that back in September Dr. Christella Hartigan wanted her to get a CT done. She had cancelled cause she was sick. She is wanting to get it rescheduled but stated that the place we were sending her would not do it cause she was in a wheel chair and wanted her to have it done at the hospital but she states she called [facility] imaging stated that they would have no problem with her having it done there with her being in a wheel chair. She is asking if she can get it scheduled there.

## 2019-07-20 NOTE — Telephone Encounter (Signed)
Dr Christella Hartigan is it ok to go ahead and schedule the CT scan from September?

## 2019-07-21 NOTE — Telephone Encounter (Signed)
Yes, that is OK.  ddx epigastric and right sided abdominal pains.  CT scan IV and oral contrast abdomen and pelvis.  thanks

## 2019-07-21 NOTE — Telephone Encounter (Signed)
The patient has been notified of this information and all questions answered.   You are scheduled on 07/27/19 at [facility] at 3 pm. You should arrive 15 minutes prior to your appointment time for registration. NPO 4 hours Please follow the written instructions below on the day of your exam. You will need to pick up contrast and have labs at least 2 days prior to the CT scan.  Your physician has requested that you come in for lab work.  You do not need an appointment.  Our basement lab is open 7:30 am-5 pm.  You do not need to be fasting. The address is [address]  [place] [address].

## 2019-07-27 ENCOUNTER — Ambulatory Visit (HOSPITAL_COMMUNITY): Payer: Medicare Other

## 2019-08-05 ENCOUNTER — Inpatient Hospital Stay (HOSPITAL_COMMUNITY): Admission: RE | Admit: 2019-08-05 | Payer: Medicare Other | Source: Ambulatory Visit

## 2019-08-06 ENCOUNTER — Other Ambulatory Visit: Payer: Self-pay | Admitting: Family

## 2019-08-14 DIAGNOSIS — I509 Heart failure, unspecified: Secondary | ICD-10-CM | POA: Diagnosis not present

## 2019-08-15 ENCOUNTER — Other Ambulatory Visit: Payer: Self-pay | Admitting: Family

## 2019-08-15 ENCOUNTER — Encounter: Payer: Self-pay | Admitting: Neurology

## 2019-08-16 NOTE — Telephone Encounter (Signed)
Please advise 

## 2019-08-17 MED ORDER — MECLIZINE HCL 25 MG PO TABS: 25.0000 mg | ORAL_TABLET | Freq: Three times a day (TID) | ORAL | 0 refills | Status: DC | PRN

## 2019-08-26 DIAGNOSIS — R6889 Other general symptoms and signs: Secondary | ICD-10-CM | POA: Diagnosis not present

## 2019-08-26 DIAGNOSIS — L811 Chloasma: Secondary | ICD-10-CM | POA: Diagnosis not present

## 2019-08-30 ENCOUNTER — Encounter: Payer: Self-pay | Admitting: Family

## 2019-08-31 ENCOUNTER — Other Ambulatory Visit: Payer: Self-pay

## 2019-08-31 DIAGNOSIS — R601 Generalized edema: Secondary | ICD-10-CM

## 2019-08-31 DIAGNOSIS — I420 Dilated cardiomyopathy: Secondary | ICD-10-CM

## 2019-08-31 MED ORDER — LOSARTAN POTASSIUM 100 MG PO TABS: 50.0000 mg | ORAL_TABLET | Freq: Every day | ORAL | 1 refills | Status: DC

## 2019-08-31 MED ORDER — METOPROLOL SUCCINATE ER 50 MG PO TB24: 50.0000 mg | ORAL_TABLET | Freq: Every day | ORAL | 1 refills | Status: DC

## 2019-08-31 MED ORDER — OMEPRAZOLE 40 MG PO CPDR: 40.0000 mg | DELAYED_RELEASE_CAPSULE | Freq: Every day | ORAL | 1 refills | Status: DC

## 2019-08-31 MED ORDER — GABAPENTIN 300 MG PO CAPS: 900.0000 mg | ORAL_CAPSULE | Freq: Three times a day (TID) | ORAL | 1 refills | Status: DC

## 2019-08-31 MED ORDER — POTASSIUM CHLORIDE CRYS ER 20 MEQ PO TBCR: 20.0000 meq | EXTENDED_RELEASE_TABLET | Freq: Every day | ORAL | 1 refills | Status: DC

## 2019-09-02 ENCOUNTER — Ambulatory Visit (HOSPITAL_COMMUNITY): Admission: RE | Admit: 2019-09-02 | Payer: Medicare Other | Source: Ambulatory Visit

## 2019-09-06 ENCOUNTER — Telehealth: Payer: Self-pay | Admitting: Gastroenterology

## 2019-09-06 NOTE — Telephone Encounter (Signed)
We have contrast for CTs that are done at [facility], not at the hospital. Tried to call pt back but voice mailbox was full and could not accept any messages.

## 2019-09-08 NOTE — Telephone Encounter (Signed)
Unable to reach pt

## 2019-09-11 DIAGNOSIS — I509 Heart failure, unspecified: Secondary | ICD-10-CM | POA: Diagnosis not present

## 2019-09-16 ENCOUNTER — Ambulatory Visit (HOSPITAL_COMMUNITY): Admission: RE | Admit: 2019-09-16 | Payer: Medicare Other | Source: Ambulatory Visit

## 2019-09-23 ENCOUNTER — Other Ambulatory Visit (INDEPENDENT_AMBULATORY_CARE_PROVIDER_SITE_OTHER): Payer: Medicare Other

## 2019-09-23 DIAGNOSIS — R1011 Right upper quadrant pain: Secondary | ICD-10-CM | POA: Diagnosis not present

## 2019-09-23 DIAGNOSIS — R1013 Epigastric pain: Secondary | ICD-10-CM | POA: Diagnosis not present

## 2019-09-23 DIAGNOSIS — R6889 Other general symptoms and signs: Secondary | ICD-10-CM | POA: Diagnosis not present

## 2019-09-23 LAB — BUN: BUN: 23 mg/dL (ref 6–23)

## 2019-09-23 LAB — CREATININE, SERUM: Creatinine, Ser: 1 mg/dL (ref 0.40–1.20)

## 2019-09-30 ENCOUNTER — Other Ambulatory Visit: Payer: Self-pay

## 2019-09-30 ENCOUNTER — Ambulatory Visit (HOSPITAL_COMMUNITY)
Admission: RE | Admit: 2019-09-30 | Discharge: 2019-09-30 | Disposition: A | Discharge status: Home / Self Care | Payer: Medicare Other | Source: Ambulatory Visit | Attending: Gastroenterology | Admitting: Gastroenterology

## 2019-09-30 DIAGNOSIS — R1011 Right upper quadrant pain: Secondary | ICD-10-CM | POA: Diagnosis not present

## 2019-09-30 DIAGNOSIS — R109 Unspecified abdominal pain: Secondary | ICD-10-CM | POA: Diagnosis not present

## 2019-09-30 DIAGNOSIS — R1013 Epigastric pain: Secondary | ICD-10-CM | POA: Diagnosis not present

## 2019-09-30 MED ORDER — SODIUM CHLORIDE (PF) 0.9 % IJ SOLN
INTRAMUSCULAR | Status: AC
  Filled 2019-09-30: qty 50

## 2019-09-30 MED ORDER — IOHEXOL 300 MG/ML  SOLN
100.0000 mL | Freq: Once | INTRAMUSCULAR | Status: AC | PRN
  Administered 2019-09-30: 14:00:00 100 mL via INTRAVENOUS

## 2019-10-06 NOTE — Progress Notes (Signed)
NEUROLOGY CONSULTATION NOTE  Briauna Miller MRN: [id number] DOB: 1955-09-12  Referring provider: Sandford Craze, NP Primary care provider: Sandford Craze, NP  Reason for consult:  Headache  HISTORY OF PRESENT ILLNESS: Briana Miller is a 64 year old left-handed female with cardiomyopathy, CHF, HTN, HLD and history of cervical myelopathy status post ACDF who presents for headaches.  History supplemented by referring provider's note.  She began experiencing positional headache and dizziness several months ago.  It occurs with fast movements, mostly when laying down or getting up from bed or turning her head to the right.  The dizziness is a spinning sensation that lasts 2 to 3 minutes.  The headache is a severe frontal pressure-like headache that lasts 5 to 10 minutes.  There is associated nausea and fuzzy vision but no vomiting, double vision, hearing loss, numbness or weakness.  It initially occurred all the time but now only 2 to 3 times a week.  When she turns her neck to the right, it feels like the right side of her neck "catches".  She initially was treated for sinusitis and was then prescribed meclizine, which helped.    Headaches when move sudden, spinning several months.  Move fast but mostly laying down or getting up from bed.  2 to 3 minutes.  Was daily for a while and now 2 to 3 times a week.  If turn sudden, feels like something in right side of neck catches.  She also reports ringing in her ears for the past 2 years.    In 2018, she developed lower extremity weakness and loss of bowel and bladder control.  She was found to have spinal stenosis due to degenerative discs at C4-5 and C5-6 causing myelopathy.  She underwent ACDF.  She has been in a wheelchair for most of the past 2 years.    Current NSAIDS:  none Current analgesics:  none Current anti-emetic:  Zofran 4mg  Current muscle relaxants:  none Current sleep aide:  melatonin Current Antihypertensive  medications:  Toprol-XL 50mg ; losartan; torsemide Current Antidepressant medications:  none Current Anticonvulsant medications:  Gabapentin 900mg  TID Current anti-CGRP:  none Current Vitamins/Herbal/Supplements:  Melatonin; KCl Current Antihistamines/Decongestants:  Meclizine  Labs from October demonstrated mildly elevated BUN 29, elevated glucose 144, and mildly low HGB 11.9 and HCT 35.9  PAST MEDICAL HISTORY: Past Medical History:  Diagnosis Date  . Acute systolic congestive heart failure (HCC) 03/01/2014  . Allergy   . Anemia   . Arthritis   . Cardiomyopathy (HCC)   . Cervical myelopathy (HCC)   . CHF (congestive heart failure) (HCC)   . GERD (gastroesophageal reflux disease)   . Heart disease   . Hyperlipidemia   . Hypertension   . Leg pain   . Overactive bladder     PAST SURGICAL HISTORY: Past Surgical History:  Procedure Laterality Date  . ANTERIOR CERVICAL DECOMP/DISCECTOMY FUSION N/A 08/03/2016   Procedure: ANTERIOR CERVICAL DECOMPRESSION/DISCECTOMY FUSION C4-5, C5-6;  Surgeon: November, MD;  Location: [facility] OR;  Service: Neurosurgery;  Laterality: N/A;  . COLONOSCOPY    . COLONOSCOPY WITH PROPOFOL N/A 02/24/2019   Procedure: COLONOSCOPY WITH PROPOFOL;  Surgeon: Barnett Abu, MD;  Location: [facility] END[facility]COPY;  Service: Endoscopy;  Laterality: N/A;  . KNEE ARTHROSCOPY     2007  . ROTATOR CUFF REPAIR Right 03/23/13  . TOTAL KNEE ARTHROPLASTY  09/22/2011   Procedure: TOTAL KNEE ARTHROPLASTY;  Surgeon: 2008, MD;  Location: [facility] OR;  Service: Orthopedics;  Laterality:  Right;    MEDICATIONS: Current Outpatient Medications on File Prior to Visit  Medication Sig Dispense Refill  . amoxicillin-clavulanate (AUGMENTIN) 875-125 MG tablet Take 1 tablet by mouth 2 (two) times daily. 20 tablet 0  . calcium-vitamin D (OSCAL WITH D) 500-200 MG-UNIT per tablet Take 1 tablet by mouth daily.    Marland Kitchen gabapentin (NEURONTIN) 300 MG capsule Take 3 capsules (900 mg total) by mouth 3  (three) times daily. 810 capsule 1  . hydrocortisone-pramoxine (PROCTOFOAM HC) rectal foam Place 1 applicator rectally 2 (two) times daily. 10 g 1  . latanoprost (XALATAN) 0.005 % ophthalmic solution Place 1 drop into both eyes at bedtime.  11  . losartan (COZAAR) 100 MG tablet Take 0.5 tablets (50 mg total) by mouth daily. 90 tablet 1  . meclizine (ANTIVERT) 25 MG tablet Take 1 tablet (25 mg total) by mouth 3 (three) times daily as needed for dizziness. 30 tablet 0  . Melatonin 3 MG TABS Take 1 tablet by mouth at bedtime.    . metoprolol succinate (TOPROL-XL) 50 MG 24 hr tablet Take 1 tablet (50 mg total) by mouth daily. Take with or immediately following a meal. 90 tablet 1  . NYAMYC powder APPLY TO AFFECTED AREA(S)  TOPICALLY TWICE DAILY 180 g 1  . omeprazole (PRILOSEC) 40 MG capsule Take 1 capsule (40 mg total) by mouth daily. 90 capsule 1  . ondansetron (ZOFRAN) 4 MG tablet Take 1 tablet (4 mg total) by mouth every 8 (eight) hours as needed for nausea or vomiting. 20 tablet 0  . potassium chloride SA (KLOR-CON) 20 MEQ tablet Take 1 tablet (20 mEq total) by mouth daily. 90 tablet 1  . pramoxine-hydrocortisone cream Apply topically 3 (three) times daily. 57 g 0  . timolol (BETIMOL) 0.5 % ophthalmic solution Place 1 drop into both eyes every morning.    . torsemide (DEMADEX) 10 MG tablet Take one tablet alternating with 2 tablets every other day 135 tablet 1   No current facility-administered medications on file prior to visit.    ALLERGIES: No Known Allergies  FAMILY HISTORY: Family History  Problem Relation Age of Onset  . Cancer Mother        colon 51 and breast 46- deceased  . Colon cancer Mother   . Diabetes Father        living  . Asthma Father   . Hypertension Father   . Heart attack Father 80       medical management per pt  . Glaucoma Father        had eye transplant  . Kidney failure Father        acute renal failure- died 29  . Esophageal cancer Neg Hx   . Stomach  cancer Neg Hx   . Rectal cancer Neg Hx    SOCIAL HISTORY: Social History   Socioeconomic History  . Marital status: Married    Spouse name: Not on file  . Number of children: 2  . Years of education: HS  . Highest education level: Not on file  Occupational History  . Occupation: Psychiatric nurse: [employer]  Tobacco Use  . Smoking status: Never Smoker  . Smokeless tobacco: Never Used  Substance and Sexual Activity  . Alcohol use: Yes    Alcohol/week: 2.0 standard drinks    Types: 2 Glasses of wine per week  . Drug use: No  . Sexual activity: Not on file  Other Topics Concern  . 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   1-2 cups caffeine daily.   Lives at home with her husband.   Social Determinants of Health   Financial Resource Strain:   . Difficulty of Paying Living Expenses:   Food Insecurity:   . Worried About Programme researcher, broadcasting/film/video in the Last Year:   . Barista in the Last Year:   Transportation Needs:   . Freight forwarder (Medical):   Marland Kitchen Lack of Transportation (Non-Medical):   Physical Activity:   . Days of Exercise per Week:   . Minutes of Exercise per Session:   Stress:   . Feeling of Stress :   Social Connections:   . Frequency of Communication with Friends and Family:   . Frequency of Social Gatherings with Friends and Family:   . Attends Religious Services:   . Active Member of Clubs or Organizations:   . Attends Banker Meetings:   Marland Kitchen Marital Status:   Intimate Partner Violence:   . Fear of Current or Ex-Partner:   . Emotionally Abused:   Marland Kitchen Physically Abused:   . Sexually Abused:      PHYSICAL EXAM: Blood pressure 127/82, pulse 67, height 5\' 6"   (1.676 m), weight 220 lb (99.8 kg), last menstrual period 06/17/2007, SpO2 97 %. General: No acute distress.  Patient appears well-groomed.   Head:  Normocephalic/atraumatic Eyes:  fundi examined but not visualized Neck: supple, no paraspinal tenderness, full range of motion Back: No paraspinal tenderness Heart: regular rate and rhythm Lungs: Clear to auscultation bilaterally. Vascular: No carotid bruits. Neurological Exam: Mental status: alert and oriented to person, place, and time, recent and remote memory intact, fund of knowledge intact, attention and concentration intact, speech fluent and not dysarthric, language intact. Cranial nerves: CN I: not tested CN II: pupils equal, round and reactive to light, visual fields intact CN III, IV, VI:  full range of motion, no nystagmus, no ptosis CN V: facial sensation intact CN VII: upper and lower face symmetric CN VIII: hearing intact CN IX, X: gag intact, uvula midline CN XI: sternocleidomastoid and trapezius muscles intact CN XII: tongue midline Bulk & Tone: normal, no fasciculations. Motor:  5-/5 throughout except 4+/5 left hip flexion (limited due to knee pain) Sensation:  Pinprick and vibration sensation intact. Deep Tendon Reflexes:  2+ throughout, right Babinski Finger to nose testing:  Without dysmetria.  Gait:  Deferred.  In wheelchair.  IMPRESSION: Positional dizziness and headache.  Dizziness sounds like BPPV.  However, that would not explain the severe headache.  I think we need to check for intracranial abnormality.  Given history of DDD of cervical spine (which may be a source of dizziness as well), will check cervical spine too.  PLAN: MRI of brain w/wo MRI cervical spine wo Further recommendations pending results.  Thank you for allowing me to take part in the care of this patient.  08/15/2007, DO  CC: Shon Millet, NP

## 2019-10-07 ENCOUNTER — Encounter: Payer: Self-pay | Admitting: Neurology

## 2019-10-07 ENCOUNTER — Ambulatory Visit: Payer: Medicare Other | Admitting: Neurology

## 2019-10-07 ENCOUNTER — Other Ambulatory Visit: Payer: Self-pay

## 2019-10-07 VITALS — BP 127/82 | HR 67 | Ht 66.0 in | Wt 220.0 lb

## 2019-10-07 DIAGNOSIS — R42 Dizziness and giddiness: Secondary | ICD-10-CM | POA: Diagnosis not present

## 2019-10-07 DIAGNOSIS — G959 Disease of spinal cord, unspecified: Secondary | ICD-10-CM

## 2019-10-07 DIAGNOSIS — R519 Headache, unspecified: Secondary | ICD-10-CM

## 2019-10-07 DIAGNOSIS — R51 Headache with orthostatic component, not elsewhere classified: Secondary | ICD-10-CM

## 2019-10-07 DIAGNOSIS — R6889 Other general symptoms and signs: Secondary | ICD-10-CM | POA: Diagnosis not present

## 2019-10-07 NOTE — Patient Instructions (Addendum)
1.  We will check MRI of brain with and without contrast and cervical spine without contrast 2.  Further recommendations pending results.  Your physician recommends that you schedule a follow-up appointment in: first available with Dr Everlena Cooper

## 2019-10-12 DIAGNOSIS — I509 Heart failure, unspecified: Secondary | ICD-10-CM | POA: Diagnosis not present

## 2019-10-28 ENCOUNTER — Telehealth: Payer: Self-pay | Admitting: Neurology

## 2019-10-28 NOTE — Telephone Encounter (Signed)
Caller left message with [facility]: states she is needing information on an appointment for an MRI.

## 2019-10-28 NOTE — Telephone Encounter (Signed)
Pt advised we are trying to get PA changed to  [facility]. Once we get the approval will schedule and give pt a call.

## 2019-10-28 NOTE — Progress Notes (Signed)
HPI: FU cardiomyopathy. Echocardiogram in October of 2014 showed an ejection fraction of 40-45%, mild left ventricular hypertrophy and mild left atrial enlargement. Nuclear study in December of 2014 showed an ejection fraction of 51% and normal perfusion.Last echo 2/18 showed EF 45-50, grade 1 DD, mild MR and TR.Since last seenshe has had 2 occasions of pain in her left upper extremity described as a heaviness.  No associated chest pain.  The pain is not positional.  Resolved spontaneously after 1 hour.  She denies chest pain.  She does have dyspnea on exertion but no orthopnea.  Occasional mild pedal edema.  Current Outpatient Medications  Medication Sig Dispense Refill  . calcium-vitamin D (OSCAL WITH D) 500-200 MG-UNIT per tablet Take 1 tablet by mouth daily.    Marland Kitchen gabapentin (NEURONTIN) 300 MG capsule Take 3 capsules (900 mg total) by mouth 3 (three) times daily. 810 capsule 1  . latanoprost (XALATAN) 0.005 % ophthalmic solution Place 1 drop into both eyes at bedtime.  11  . losartan (COZAAR) 100 MG tablet Take 0.5 tablets (50 mg total) by mouth daily. 90 tablet 1  . Melatonin 3 MG TABS Take 1 tablet by mouth at bedtime.    . metoprolol succinate (TOPROL-XL) 50 MG 24 hr tablet Take 1 tablet (50 mg total) by mouth daily. Take with or immediately following a meal. 90 tablet 1  . NYAMYC powder APPLY TO AFFECTED AREA(S)  TOPICALLY TWICE DAILY 180 g 1  . omeprazole (PRILOSEC) 40 MG capsule Take 1 capsule (40 mg total) by mouth daily. 90 capsule 1  . potassium chloride SA (KLOR-CON) 20 MEQ tablet Take 1 tablet (20 mEq total) by mouth daily. 90 tablet 1  . timolol (BETIMOL) 0.5 % ophthalmic solution Place 1 drop into both eyes every morning.    . torsemide (DEMADEX) 10 MG tablet Take one tablet alternating with 2 tablets every other day 135 tablet 1   No current facility-administered medications for this visit.     Past Medical History:  Diagnosis Date  . Acute systolic congestive  heart failure (HCC) 03/01/2014  . Allergy   . Anemia   . Arthritis   . Cardiomyopathy (HCC)   . Cervical myelopathy (HCC)   . CHF (congestive heart failure) (HCC)   . GERD (gastroesophageal reflux disease)   . Heart disease   . Hyperlipidemia   . Hypertension   . Leg pain   . Overactive bladder     Past Surgical History:  Procedure Laterality Date  . ANTERIOR CERVICAL DECOMP/DISCECTOMY FUSION N/A 08/03/2016   Procedure: ANTERIOR CERVICAL DECOMPRESSION/DISCECTOMY FUSION C4-5, C5-6;  Surgeon: Barnett Abu, MD;  Location: [facility] OR;  Service: Neurosurgery;  Laterality: N/A;  . COLONOSCOPY    . COLONOSCOPY WITH PROPOFOL N/A 02/24/2019   Procedure: COLONOSCOPY WITH PROPOFOL;  Surgeon: Rachael Fee, MD;  Location: [facility];  Service: Endoscopy;  Laterality: N/A;  . KNEE ARTHROSCOPY     2007  . ROTATOR CUFF REPAIR Right 03/23/13  . TOTAL KNEE ARTHROPLASTY  09/22/2011   Procedure: TOTAL KNEE ARTHROPLASTY;  Surgeon: Raymon Mutton, MD;  Location: [facility] OR;  Service: Orthopedics;  Laterality: Right;    Social History   Socioeconomic History  . Marital status: Married    Spouse name: Not on file  . Number of children: 2  . Years of education: HS  . Highest education level: Not on file  Occupational History  . Occupation: Acupuncturist: [employer]  Tobacco  Use  . Smoking status: Never Smoker  . Smokeless tobacco: Never Used  Substance and Sexual Activity  . Alcohol use: Yes    Alcohol/week: 2.0 standard drinks    Types: 2 Glasses of wine per week  . Drug use: No  . Sexual activity: Not on file  Other Topics Concern  . 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   1-2 cups caffeine daily.   Lives at home  with her husband.   Social Determinants of Health   Financial Resource Strain:   . Difficulty of Paying Living Expenses:   Food Insecurity:   . Worried About Programme researcher, broadcasting/film/video in the Last Year:   . Barista in the Last Year:   Transportation Needs:   . Freight forwarder (Medical):   Marland Kitchen Lack of Transportation (Non-Medical):   Physical Activity:   . Days of Exercise per Week:   . Minutes of Exercise per Session:   Stress:   . Feeling of Stress :   Social Connections:   . Frequency of Communication with Friends and Family:   . Frequency of Social Gatherings with Friends and Family:   . Attends Religious Services:   . Active Member of Clubs or Organizations:   . Attends Banker Meetings:   Marland Kitchen Marital Status:   Intimate Partner Violence:   . Fear of Current or Ex-Partner:   . Emotionally Abused:   Marland Kitchen Physically Abused:   . Sexually Abused:     Family History  Problem Relation Age of Onset  . Cancer Mother        colon 75 and breast 52- deceased  . Colon cancer Mother   . Diabetes Father        living  . Asthma Father   . Hypertension Father   . Heart attack Father 39       medical management per pt  . Glaucoma Father        had eye transplant  . Kidney failure Father        acute renal failure- died 50  . Esophageal cancer Neg Hx   . Stomach cancer Neg Hx   . Rectal cancer Neg Hx     ROS: Knee pain but no fevers or chills, productive cough, hemoptysis, dysphasia, odynophagia, melena, hematochezia, dysuria, hematuria, rash, seizure activity, orthopnea, PND, claudication. Remaining systems are negative.  Physical Exam: Well-developed morbidly obese in no acute distress.  Skin is warm and dry.  HEENT is normal.  Neck is supple.  Chest is clear to auscultation with normal expansion.  Cardiovascular exam is regular rate and rhythm.  Abdominal exam nontender or distended. No masses palpated. Extremities show trace edema. neuro grossly  intact  ECG-sinus rhythm with left bundle branch block.  Personally reviewed  A/P  1 cardiomyopathy-mildly reduced on most recent echocardiogram.  Continue ARB and beta-blocker.  2 hypertension-blood pressure mildly elevated.  Increase losartan to 100 mg daily and follow.  Check potassium and renal function in 1 week.  3 chronic combined systolic/diastolic congestive heart failure-euvolemic today.  Continue diuretic at present dose.  Check potassium and renal function.  4 morbid obesity/snoring-previously referred  to pulmonary for sleep study.  5 preoperative evaluation prior to knee replacement-she had some left upper extremity heaviness of uncertain etiology.  We will arrange a [place] nuclear study for risk stratification.  We will plan to repeat echocardiogram as well.  If unchanged compared to previous she may proceed without further evaluation.  Kirk Ruths, MD

## 2019-10-31 ENCOUNTER — Other Ambulatory Visit: Payer: Self-pay

## 2019-10-31 ENCOUNTER — Telehealth: Payer: Self-pay

## 2019-10-31 DIAGNOSIS — R519 Headache, unspecified: Secondary | ICD-10-CM

## 2019-10-31 DIAGNOSIS — R51 Headache with orthostatic component, not elsewhere classified: Secondary | ICD-10-CM

## 2019-10-31 DIAGNOSIS — R42 Dizziness and giddiness: Secondary | ICD-10-CM

## 2019-10-31 NOTE — Telephone Encounter (Signed)
MRI Brain and Cervical Spine schedule for 11/15/19 at 4pm pt to arrive at [facility] at 3:30pm.   Pt advised of Appointment.

## 2019-10-31 NOTE — Progress Notes (Signed)
New orders added. Per pt Briana Miller imaging unable to do procedure due to pt unable to stand on her own

## 2019-11-04 ENCOUNTER — Encounter: Payer: Self-pay | Admitting: Cardiology

## 2019-11-04 ENCOUNTER — Encounter: Payer: Self-pay | Admitting: *Deleted

## 2019-11-04 ENCOUNTER — Ambulatory Visit: Payer: Medicare Other | Admitting: Physician Assistant

## 2019-11-04 ENCOUNTER — Ambulatory Visit: Payer: Medicare Other | Admitting: Cardiology

## 2019-11-04 ENCOUNTER — Other Ambulatory Visit: Payer: Self-pay

## 2019-11-04 ENCOUNTER — Other Ambulatory Visit: Payer: Self-pay | Admitting: Cardiology

## 2019-11-04 ENCOUNTER — Encounter: Payer: Self-pay | Admitting: Physician Assistant

## 2019-11-04 VITALS — BP 114/80 | HR 72

## 2019-11-04 VITALS — BP 138/83 | HR 68

## 2019-11-04 DIAGNOSIS — I10 Essential (primary) hypertension: Secondary | ICD-10-CM

## 2019-11-04 DIAGNOSIS — Z01818 Encounter for other preprocedural examination: Secondary | ICD-10-CM

## 2019-11-04 DIAGNOSIS — R0602 Shortness of breath: Secondary | ICD-10-CM

## 2019-11-04 DIAGNOSIS — R6889 Other general symptoms and signs: Secondary | ICD-10-CM | POA: Diagnosis not present

## 2019-11-04 DIAGNOSIS — R072 Precordial pain: Secondary | ICD-10-CM | POA: Diagnosis not present

## 2019-11-04 DIAGNOSIS — R110 Nausea: Secondary | ICD-10-CM | POA: Diagnosis not present

## 2019-11-04 DIAGNOSIS — R1013 Epigastric pain: Secondary | ICD-10-CM | POA: Diagnosis not present

## 2019-11-04 DIAGNOSIS — I420 Dilated cardiomyopathy: Secondary | ICD-10-CM | POA: Diagnosis not present

## 2019-11-04 MED ORDER — LOSARTAN POTASSIUM 100 MG PO TABS: 100.0000 mg | ORAL_TABLET | Freq: Every day | ORAL | 3 refills | Status: DC

## 2019-11-04 MED ORDER — ONDANSETRON HCL 4 MG PO TABS: ORAL_TABLET | ORAL | 2 refills | Status: DC

## 2019-11-04 MED ORDER — PANTOPRAZOLE SODIUM 40 MG PO TBEC: 40.0000 mg | DELAYED_RELEASE_TABLET | Freq: Two times a day (BID) | ORAL | 1 refills | Status: DC

## 2019-11-04 MED ORDER — METOPROLOL SUCCINATE ER 50 MG PO TB24: 50.0000 mg | ORAL_TABLET | Freq: Every day | ORAL | 1 refills | Status: DC

## 2019-11-04 NOTE — Progress Notes (Signed)
Chief Complaint: Abdominal pain  Review of pertinent gastrointestinal problems: 1. Family history of colon cancer; mother in her early 74s; colonoscopy Dr. Christella Hartigan 12/2014found diverticulosis, no polyps, recommended recall at 5 years. 2. RUQ pains:clinically seemed biliary-like; 05/2013 [place] [facility] was normal; 05/2013 HIDA scan with GB EF was normal.05/2013 LFTs were normal. Was recommended, scheduled for EGD but she cancelled it. EGD 12/2015Dr. Christella Hartigan, mild to moderate gastritis; path showed no H. Pylori. She had been taking BC powders twice a day (around 2013-2015).Repeat EGD June 2017,DrChristella Hartigan. This was requested by her bariatric surgeon prior to surgery given her history of gastritis. I noted a small about of food residue in the stomach,the examination was otherwise normal.  3. Morbid obesityBMI 41 (10/2017 data)  HPI:    Briana Miller is a 64 year old African-American female with a past medical history as listed below including CHF, GERD and acute systolic congestive heart failure, known to Dr. Christella Hartigan, who presents to clinic today with a complaint of abdominal pain.    02/04/2019 patient seen in clinic by Dr. Christella Hartigan. That time as discussed that she had been seen a year ago for bloating belching and nausea. Stop tramadol might of contributed to some of her symptoms. Gastric emptying scan was ordered which she never had done. She was in the day for a "sizzling" sensation in her right flank for the past month or so intermittent and lasted about 2 minutes. Also dull pain in the same location with intermittent epigastric pains over the past 2 to 3 months. An abdominal ultrasound 3 years prior was normal. At that time discussed liver test and CBC were normal. Recommend she have a colonoscopy to get her up-to-date on colon cancer screening given her mother history of colon cancer around age 34. He would have to be in the hospital since she is nonambulatory.    02/24/2019 colonoscopy with  diverticulosis in the whole colon otherwise normal. Repeat recommended in 5 years due to family history. Also a CT was arranged for ongoing right-sided pain.    04/08/2019 CBC, lipase, hepatic function panel and BMP were normal.    10/01/2019 CT abdomen pelvis with contrast showed no acute abnormality, no evidence of bowel obstruction or acute bowel inflammation, mild diffuse colonic diverticulosis with no evidence of acute diverticulitis, normal appendix, stable mildly enlarged myomatous uterus. Nonspecific mild patchy subpleural groundglass opacity in the lingula, new since 2017. Thought to be inflammatory.    Today, the patient presents to clinic and explains that she has nausea which seems to come and go throughout the day, sometimes it is there in the morning before "I have even eaten anything".  Also explains that 2 nights out of the week she will have just laid down to go to bed and will have an epigastric pain up under her rib cage which she cannot even describe but tells me it only lasts for a couple minutes at a time.  Also still uncontrolled heartburn and reflux regardless for Omeprazole 40 mg daily.  Tells me she tries Gas-X as well which does not really help.  Associated symptoms include some loose stools.    Denies fever, chills, weight loss, vomiting or symptoms that awaken her from sleep.  Past Medical History:  Diagnosis Date  . Acute systolic congestive heart failure (HCC) 03/01/2014  . Allergy   . Anemia   . Arthritis   . Cardiomyopathy (HCC)   . Cervical myelopathy (HCC)   . CHF (congestive heart failure) (HCC)   .  GERD (gastroesophageal reflux disease)   . Heart disease   . Hyperlipidemia   . Hypertension   . Leg pain   . Overactive bladder     Past Surgical History:  Procedure Laterality Date  . ANTERIOR CERVICAL DECOMP/DISCECTOMY FUSION N/A 08/03/2016   Procedure: ANTERIOR CERVICAL DECOMPRESSION/DISCECTOMY FUSION C4-5, C5-6;  Surgeon: Barnett Abu, MD;  Location: [facility];   Service: Neurosurgery;  Laterality: N/A;  . COLONOSCOPY    . COLONOSCOPY WITH PROPOFOL N/A 02/24/2019   Procedure: COLONOSCOPY WITH PROPOFOL;  Surgeon: Rachael Fee, MD;  Location: [facility];  Service: Endoscopy;  Laterality: N/A;  . KNEE ARTHROSCOPY     2007  . ROTATOR CUFF REPAIR Right 03/23/13  . TOTAL KNEE ARTHROPLASTY  09/22/2011   Procedure: TOTAL KNEE ARTHROPLASTY;  Surgeon: Raymon Mutton, MD;  Location: [facility] OR;  Service: Orthopedics;  Laterality: Right;    Current Outpatient Medications  Medication Sig Dispense Refill  . calcium-vitamin D (OSCAL WITH D) 500-200 MG-UNIT per tablet Take 1 tablet by mouth daily.    Marland Kitchen gabapentin (NEURONTIN) 300 MG capsule Take 3 capsules (900 mg total) by mouth 3 (three) times daily. 810 capsule 1  . latanoprost (XALATAN) 0.005 % ophthalmic solution Place 1 drop into both eyes at bedtime.  11  . losartan (COZAAR) 100 MG tablet Take 1 tablet (100 mg total) by mouth daily. 90 tablet 3  . Melatonin 3 MG TABS Take 1 tablet by mouth at bedtime.    . metoprolol succinate (TOPROL-XL) 50 MG 24 hr tablet Take 1 tablet (50 mg total) by mouth daily. Take with or immediately following a meal. 90 tablet 1  . NYAMYC powder APPLY TO AFFECTED AREA(S)  TOPICALLY TWICE DAILY 180 g 1  . omeprazole (PRILOSEC) 40 MG capsule Take 1 capsule (40 mg total) by mouth daily. 90 capsule 1  . potassium chloride SA (KLOR-CON) 20 MEQ tablet Take 1 tablet (20 mEq total) by mouth daily. 90 tablet 1  . timolol (BETIMOL) 0.5 % ophthalmic solution Place 1 drop into both eyes every morning.    . torsemide (DEMADEX) 10 MG tablet Take one tablet alternating with 2 tablets every other day 135 tablet 1   No current facility-administered medications for this visit.    Allergies as of 11/04/2019  . (No Known Allergies)    Family History  Problem Relation Age of Onset  . Cancer Mother        colon 20 and breast 34- deceased  . Colon cancer Mother   . Diabetes Father        living    . Asthma Father   . Hypertension Father   . Heart attack Father 82       medical management per pt  . Glaucoma Father        had eye transplant  . Kidney failure Father        acute renal failure- died 43  . Esophageal cancer Neg Hx   . Stomach cancer Neg Hx   . Rectal cancer Neg Hx     Social History   Socioeconomic History  . Marital status: Married    Spouse name: Not on file  . Number of children: 2  . Years of education: HS  . Highest education level: Not on file  Occupational History  . 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 Smoking status: Never Smoker  . Smokeless tobacco: Never Used  Substance and Sexual Activity  .  Alcohol use: Yes    Alcohol/week: 2.0 standard drinks    Types: 2 Glasses of wine per week  . Drug use: No  . Sexual activity: Not on file  Other Topics Concern  . 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   1-2 cups caffeine daily.   Lives at home with her husband.   Social Determinants of Health   Financial Resource Strain:   . Difficulty of Paying Living Expenses:   Food Insecurity:   . Worried About Charity fundraiser in the Last Year:   . Arboriculturist in the Last Year:   Transportation Needs:   . Film/video editor (Medical):   Marland Kitchen Lack of Transportation (Non-Medical):   Physical Activity:   . Days of Exercise per Week:   . Minutes of Exercise per Session:   Stress:   . Feeling of Stress :   Social Connections:   . Frequency of Communication with Friends and Family:   . Frequency of Social Gatherings with Friends and Family:   . Attends Religious Services:   . Active Member of Clubs or Organizations:   . Attends Archivist  Meetings:   Marland Kitchen Marital Status:   Intimate Partner Violence:   . Fear of Current or Ex-Partner:   . Emotionally Abused:   Marland Kitchen Physically Abused:   . Sexually Abused:     Review of Systems:    Constitutional: No weight loss, fever or chills Cardiovascular: No chest pain Respiratory: No SOB  Gastrointestinal: See HPI and otherwise negative   Physical Exam:  Vital signs: BP 114/80 (BP Location: Left Arm, Patient Position: Sitting, Cuff Size: Large)   Pulse 72   LMP 06/17/2007   Constitutional:   Pleasant AA female appears to be in NAD, Well developed, Well nourished, alert and cooperative Respiratory: Respirations even and unlabored. Lungs clear to auscultation bilaterally.   No wheezes, crackles, or rhonchi.  Cardiovascular: Normal S1, S2. No MRG. Regular rate and rhythm. No peripheral edema, cyanosis or pallor.  Gastrointestinal:  Soft, nondistended, nontender. No rebound or guarding. Normal bowel sounds. No appreciable masses or hepatomegaly. Rectal:  Not performed.  Psychiatric: Demonstrates good judgement and reason without abnormal affect or behaviors.  MOST RECENT LABS AND IMAGING: CBC    Component Value Date/Time   WBC 6.0 04/08/2019 1406   RBC 4.23 04/08/2019 1406   HGB 11.9 (L) 04/08/2019 1406   HCT 35.9 (L) 04/08/2019 1406   PLT 269.0 04/08/2019 1406   MCV 84.8 04/08/2019 1406   MCH 29.1 08/05/2016 0541   MCHC 33.2 04/08/2019 1406   RDW 14.6 04/08/2019 1406   LYMPHSABS 2.1 04/08/2019 1406   MONOABS 0.9 04/08/2019 1406   EOSABS 0.1 04/08/2019 1406   BASOSABS 0.0 04/08/2019 1406    CMP     Component Value Date/Time   NA 140 04/08/2019 1406   NA 142 08/25/2018 0836   K 3.9 04/08/2019 1406   CL 105 04/08/2019 1406   CO2 24 04/08/2019 1406   GLUCOSE 144 (H) 04/08/2019 1406   BUN 23 09/23/2019 1111   BUN 19 08/25/2018 0836   CREATININE 1.00 09/23/2019 1111   CREATININE 0.82  09/14/2015 1209   CALCIUM 9.2 04/08/2019 1406   PROT 7.2 04/08/2019 1406   ALBUMIN  4.2 04/08/2019 1406   AST 16 04/08/2019 1406   ALT 13 04/08/2019 1406   ALKPHOS 87 04/08/2019 1406   BILITOT 0.6 04/08/2019 1406   GFRNONAA 64 08/25/2018 0836   GFRAA 73 08/25/2018 0836    Assessment: 1.  Epigastric pain: Patient also describes uncontrolled heartburn and reflux, possibility that epigastric pain and nausea are related to gastritis is fairly high 2.  Nausea  Plan: 1.  Stop Omeprazole.  Prescribed Pantoprazole 40 mg twice daily, 30 to 60 minutes before breakfast and dinner #60 with 5 refills. 2.  Also prescribed Zofran 4 mg ODT every 4-6 hours as needed for nausea. 3.  Patient will have follow-up with me in 4-6 weeks or sooner if necessary.  Hyacinth Meeker, PA-C [facility] 11/04/2019, 11:41 AM  Cc: Sandford Craze, NP

## 2019-11-04 NOTE — Patient Instructions (Signed)
We have sent the following medications to your pharmacy for you to pick up at your convenience:  Pantoprazole, Zofran  Please follow up on 12/05/2019 at 11:30am.

## 2019-11-04 NOTE — Progress Notes (Signed)
I agree with the above note, plan 

## 2019-11-04 NOTE — Patient Instructions (Signed)
Medication Instructions:   INCREASE LOSARTAN TO 100 MG ONCE DAILY  *If you need a refill on your cardiac medications before your next appointment, please call your pharmacy*   Lab Work: Your physician recommends that you return for lab work in:ONE WEEK  If you have labs (blood work) drawn today and your tests are completely normal, you will receive your results only by: Marland Kitchen MyChart Message (if you have MyChart) OR . A paper copy in the mail If you have any lab test that is abnormal or we need to change your treatment, we will call you to review the results.   Testing/Procedures:  Your physician has requested that you have an echocardiogram. Echocardiography is a painless test that uses sound waves to create images of your heart. It provides your doctor with information about the size and shape of your heart and how well your heart's chambers and valves are working. This procedure takes approximately one hour. There are no restrictions for this procedure.[address]  Your physician has requested that you have a lexiscan myoview. For further information please visit [email or web address] Please follow instruction sheet, as given.[address]     Follow-Up: At [facility], you and your health needs are our priority.  As part of our continuing mission to provide you with exceptional heart care, we have created designated Provider Care Teams.  These Care Teams include your primary Cardiologist (physician) and Advanced Practice Providers (APPs -  Physician Assistants and Nurse Practitioners) who all work together to provide you with the care you need, when you need it.  We recommend signing up for the patient portal called "MyChart".  Sign up information is provided on this After Visit Summary.  MyChart is used to connect with patients for Virtual Visits (Telemedicine).  Patients are able to view lab/test results, encounter notes, upcoming appointments, etc.  Non-urgent  messages can be sent to your provider as well.   To learn more about what you can do with MyChart, go to [email or web address].    Your next appointment:   6 month(s)  The format for your next appointment:   Either In Person or Virtual  Provider:   You may see Olga Millers, MD or one of the following Advanced Practice Providers on your designated Care Team:    Corine Shelter, PA-C  [place], [place]  Edd Fabian, [place]

## 2019-11-04 NOTE — Telephone Encounter (Signed)
*  STAT* If patient is at the pharmacy, call can be transferred to refill team.   1. Which medications need to be refilled? (please list name of each medication and dose if known)  Metoprolol and Losartan  2. Which pharmacy/location (including street and city if local pharmacy) is medication to be sent to? [facility]  3. Do they need a 30 day or 90 day supply? 90 days and refills

## 2019-11-08 ENCOUNTER — Encounter (HOSPITAL_COMMUNITY): Payer: Self-pay | Admitting: *Deleted

## 2019-11-09 ENCOUNTER — Encounter: Payer: Self-pay | Admitting: *Deleted

## 2019-11-09 ENCOUNTER — Telehealth: Payer: Self-pay | Admitting: *Deleted

## 2019-11-09 MED ORDER — LOSARTAN POTASSIUM 100 MG PO TABS: 100.0000 mg | ORAL_TABLET | Freq: Every day | ORAL | 3 refills | Status: DC

## 2019-11-09 NOTE — Telephone Encounter (Signed)
Refill

## 2019-11-10 ENCOUNTER — Other Ambulatory Visit: Payer: Self-pay | Admitting: Family

## 2019-11-11 DIAGNOSIS — I509 Heart failure, unspecified: Secondary | ICD-10-CM | POA: Diagnosis not present

## 2019-11-15 ENCOUNTER — Other Ambulatory Visit (HOSPITAL_COMMUNITY): Payer: Medicare Other

## 2019-11-15 ENCOUNTER — Ambulatory Visit (HOSPITAL_COMMUNITY): Payer: Medicare Other

## 2019-11-15 ENCOUNTER — Telehealth: Payer: Self-pay | Admitting: Cardiology

## 2019-11-15 NOTE — Telephone Encounter (Signed)
Patient wanted to know if there was a way to schedule her Echocardiogram to be done at [facility] when she has her CT done. Please advise

## 2019-11-15 NOTE — Telephone Encounter (Signed)
Returned call to patient.Advised we do not schedule echos at [facility].

## 2019-11-18 ENCOUNTER — Ambulatory Visit (HOSPITAL_COMMUNITY): Payer: Medicare Other

## 2019-11-22 ENCOUNTER — Other Ambulatory Visit: Payer: Self-pay

## 2019-11-22 ENCOUNTER — Ambulatory Visit (HOSPITAL_COMMUNITY)
Admission: RE | Admit: 2019-11-22 | Discharge: 2019-11-22 | Disposition: A | Discharge status: Home / Self Care | Payer: Medicare Other | Source: Ambulatory Visit | Attending: Neurology | Admitting: Neurology

## 2019-11-22 DIAGNOSIS — R519 Headache, unspecified: Secondary | ICD-10-CM | POA: Diagnosis not present

## 2019-11-22 DIAGNOSIS — R51 Headache with orthostatic component, not elsewhere classified: Secondary | ICD-10-CM | POA: Diagnosis not present

## 2019-11-22 DIAGNOSIS — R42 Dizziness and giddiness: Secondary | ICD-10-CM | POA: Diagnosis not present

## 2019-11-22 DIAGNOSIS — R6889 Other general symptoms and signs: Secondary | ICD-10-CM | POA: Diagnosis not present

## 2019-11-22 MED ORDER — GADOBUTROL 1 MMOL/ML IV SOLN
10.0000 mL | Freq: Once | INTRAVENOUS | Status: AC | PRN
  Administered 2019-11-22: 10 mL via INTRAVENOUS

## 2019-11-24 ENCOUNTER — Other Ambulatory Visit: Payer: Self-pay

## 2019-11-24 ENCOUNTER — Telehealth: Payer: Self-pay

## 2019-11-24 ENCOUNTER — Telehealth: Payer: Self-pay | Admitting: Neurology

## 2019-11-24 MED ORDER — NORTRIPTYLINE HCL 10 MG PO CAPS: ORAL_CAPSULE | ORAL | 0 refills | Status: DC

## 2019-11-24 NOTE — Telephone Encounter (Signed)
AccessNurse message:  "Caller states she had MRI yesterday. She is looking for results and wants pain medication."

## 2019-11-24 NOTE — Telephone Encounter (Signed)
MRI Results given to pt.   Pt states she had a Headache for a while now. Pt states she Tried Tylenol. It's not helping. Pt wanted to know since her Results of her MRI are normal can she be prescribed something for the headache now? Please advise

## 2019-11-24 NOTE — Telephone Encounter (Signed)
I responded in result notes.  Brain is normal.

## 2019-11-24 NOTE — Telephone Encounter (Signed)
Pt advised of script. And directions on how to take it.

## 2019-11-24 NOTE — Telephone Encounter (Signed)
Start nortriptyline 10mg  capsule:  Take 1 capsule at bedtime for a week, then 2 capsules at bedtime.

## 2019-11-24 NOTE — Telephone Encounter (Signed)
-----   Message from Drema Dallas, DO sent at 11/24/2019  8:19 AM EDT ----- Brain looks normal.

## 2019-11-28 ENCOUNTER — Telehealth (HOSPITAL_COMMUNITY): Payer: Self-pay | Admitting: Cardiology

## 2019-11-28 ENCOUNTER — Other Ambulatory Visit: Payer: Self-pay | Admitting: Family

## 2019-11-28 DIAGNOSIS — Z01818 Encounter for other preprocedural examination: Secondary | ICD-10-CM

## 2019-11-28 DIAGNOSIS — R072 Precordial pain: Secondary | ICD-10-CM

## 2019-11-28 DIAGNOSIS — R0602 Shortness of breath: Secondary | ICD-10-CM

## 2019-11-28 NOTE — Addendum Note (Signed)
Addended by: Freddi Starr on: 11/28/2019 04:02 PM   Modules accepted: Orders

## 2019-11-28 NOTE — Telephone Encounter (Signed)
Orders placed for testing at [facility].

## 2019-11-28 NOTE — Telephone Encounter (Signed)
I called patient to schedule Echocardiogram and Myoview and patient states that she has not been able to walk for 2 years and unable to move to bed. We will need to schedule at the Hospital and she would like to have the Myoview also at the hospital preferably on the same day.  Thank you .

## 2019-11-29 ENCOUNTER — Other Ambulatory Visit (HOSPITAL_COMMUNITY): Payer: Medicare Other

## 2019-11-29 ENCOUNTER — Ambulatory Visit (HOSPITAL_COMMUNITY): Payer: Medicare Other

## 2019-11-29 NOTE — Progress Notes (Deleted)
NEUROLOGY FOLLOW UP OFFICE NOTE  Alnisa Hasley [id number]  HISTORY OF PRESENT ILLNESS: Briana Miller is a 64 year old left-handed female with cardiomyopathy, CHF, HTN, HLD and history of cervical myelopathy status post ACDF who follows up for headache.  UPDATE: MRI of brain with and without contrast on 11/23/2019 personally reviewed showed incidental decrease T1 signal within the clivus and upper cervical spine compatible with red marrow conversion but no acute intracranial abnormality to explain headache and dizziness.  MRI of cervical spine scheduled for 6/25.  *** Current NSAIDS:  none Current analgesics:  none Current anti-emetic:  Zofran 4mg  Current muscle relaxants:  none Current sleep aide:  melatonin Current Antihypertensive medications:  Toprol-XL 50mg ; losartan; torsemide Current Antidepressant medications:  none Current Anticonvulsant medications:  Gabapentin 900mg  TID Current anti-CGRP:  none Current Vitamins/Herbal/Supplements:  Melatonin; KCl Current Antihistamines/Decongestants:  Meclizine  HISTORY: She began experiencing positional headache and dizziness in 2020.  It occurs with fast movements, mostly when laying down or getting up from bed or turning her head to the right.  The dizziness is a spinning sensation that lasts 2 to 3 minutes.  The headache is a severe frontal pressure-like headache that lasts 5 to 10 minutes.  There is associated nausea and fuzzy vision but no vomiting, double vision, hearing loss, numbness or weakness.  It initially occurred all the time but now only 2 to 3 times a week.  When she turns her neck to the right, it feels like the right side of her neck "catches".  She initially was treated for sinusitis and was then prescribed meclizine, which helped.    Headaches when move sudden, spinning several months.  Move fast but mostly laying down or getting up from bed.  2 to 3 minutes.  Was daily for a while and now 2 to 3 times a  week.  If turn sudden, feels like something in right side of neck catches.  She also reports ringing in her ears for the past 2 years.    In 2018, she developed lower extremity weakness and loss of bowel and bladder control.  She was found to have spinal stenosis due to degenerative discs at C4-5 and C5-6 causing myelopathy.  She underwent ACDF.  She has been in a wheelchair for most of the past 2 years.     PAST MEDICAL HISTORY: Past Medical History:  Diagnosis Date  . Acute systolic congestive heart failure (HCC) 03/01/2014  . Allergy   . Anemia   . Arthritis   . Cardiomyopathy (HCC)   . Cervical myelopathy (HCC)   . CHF (congestive heart failure) (HCC)   . GERD (gastroesophageal reflux disease)   . Heart disease   . Hyperlipidemia   . Hypertension   . Leg pain   . Overactive bladder     MEDICATIONS: Current Outpatient Medications on File Prior to Visit  Medication Sig Dispense Refill  . Cholecalciferol (VITAMIN D3 PO) Take 1 tablet by mouth daily.    2021 gabapentin (NEURONTIN) 300 MG capsule Take 3 capsules (900 mg total) by mouth 3 (three) times daily. 810 capsule 1  . latanoprost (XALATAN) 0.005 % ophthalmic solution Place 1 drop into both eyes at bedtime.  11  . losartan (COZAAR) 100 MG tablet Take 1 tablet (100 mg total) by mouth daily. 90 tablet 3  . Melatonin 3 MG TABS Take 1 tablet by mouth at bedtime.    . metoprolol succinate (TOPROL-XL) 50 MG 24 hr tablet Take 1 tablet (  50 mg total) by mouth daily. Take with or immediately following a meal. 90 tablet 1  . nortriptyline (PAMELOR) 10 MG capsule Take 1 capsule at bedtime for a week, then 2 capsules at bedtime. 60 capsule 0  . NYAMYC powder APPLY TO AFFECTED AREA(S)  TOPICALLY TWICE DAILY 180 g 1  . ondansetron (ZOFRAN) 4 MG tablet Take one tablet every 4-6 hours as needed for nausea 30 tablet 2  . pantoprazole (PROTONIX) 40 MG tablet Take 1 tablet (40 mg total) by mouth 2 (two) times daily. 180 tablet 1  . potassium  chloride SA (KLOR-CON) 20 MEQ tablet Take 1 tablet (20 mEq total) by mouth daily. 90 tablet 1  . timolol (BETIMOL) 0.5 % ophthalmic solution Place 1 drop into both eyes every morning.    . torsemide (DEMADEX) 10 MG tablet Take one tablet alternating with 2 tablets every other day 135 tablet 1   No current facility-administered medications on file prior to visit.    ALLERGIES: No Known Allergies  FAMILY HISTORY: Family History  Problem Relation Age of Onset  . Cancer Mother        colon 75 and breast 100- deceased  . Colon cancer Mother   . Diabetes Father        living  . Asthma Father   . Hypertension Father   . Heart attack Father 15       medical management per pt  . Glaucoma Father        had eye transplant  . Kidney failure Father        acute renal failure- died 76  . Esophageal cancer Neg Hx   . Stomach cancer Neg Hx   . Rectal cancer Neg Hx     SOCIAL HISTORY: Social History   Socioeconomic History  . Marital status: Married    Spouse name: Not on file  . Number of children: 2  . Years of education: HS  . Highest education level: Not on file  Occupational History  . 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 Smoking status: Never Smoker  . Smokeless tobacco: Never Used  Substance and Sexual Activity  . Alcohol use: Yes    Alcohol/week: 2.0 standard drinks    Types: 2 Glasses of wine per week  . Drug use: No  . Sexual activity: Not on file  Other Topics Concern  . 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   1-2 cups caffeine daily.   Lives at home with her husband.   Social Determinants of Health   Financial Resource Strain:   . Difficulty of Paying Living  Expenses:   Food Insecurity:   . Worried About Programme researcher, broadcasting/film/video in the Last Year:   . Barista in the Last Year:   Transportation Needs:   . Freight forwarder (Medical):   Marland Kitchen Lack of Transportation (Non-Medical):   Physical Activity:   . Days of Exercise per Week:   . Minutes of Exercise per Session:   Stress:   . Feeling of Stress :   Social Connections:   . Frequency of Communication with Friends and  Family:   . Frequency of Social Gatherings with Friends and Family:   . Attends Religious Services:   . Active Member of Clubs or Organizations:   . Attends Archivist Meetings:   Marland Kitchen Marital Status:   Intimate Partner Violence:   . Fear of Current or Ex-Partner:   . Emotionally Abused:   Marland Kitchen Physically Abused:   . Sexually Abused:     PHYSICAL EXAM: *** General: No acute distress.  Patient appears well-groomed.   Head:  Normocephalic/atraumatic Eyes:  Fundi examined but not visualized Neck: supple, no paraspinal tenderness, full range of motion Heart:  Regular rate and rhythm Lungs:  Clear to auscultation bilaterally Back: No paraspinal tenderness Neurological Exam: alert and oriented to person, place, and time. Attention span and concentration intact, recent and remote memory intact, fund of knowledge intact.  Speech fluent and not dysarthric, language intact.  CN II-XII intact. Bulk and tone normal, muscle strength 5/5 throughout.  Sensation to light touch, temperature and vibration intact.  Deep tendon reflexes 2+ throughout, toes downgoing.  Finger to nose and heel to shin testing intact.  Gait normal, Romberg negative.  IMPRESSION: ***  PLAN: ***  Metta Clines, DO  CC: ***

## 2019-12-01 ENCOUNTER — Ambulatory Visit: Payer: Medicare Other | Admitting: Neurology

## 2019-12-02 ENCOUNTER — Encounter: Payer: Self-pay | Admitting: Family

## 2019-12-02 ENCOUNTER — Ambulatory Visit (INDEPENDENT_AMBULATORY_CARE_PROVIDER_SITE_OTHER): Payer: Medicare Other | Admitting: Family

## 2019-12-02 ENCOUNTER — Other Ambulatory Visit: Payer: Self-pay

## 2019-12-02 VITALS — BP 131/72 | HR 88 | Temp 97.8°F | Resp 16

## 2019-12-02 DIAGNOSIS — G4733 Obstructive sleep apnea (adult) (pediatric): Secondary | ICD-10-CM

## 2019-12-02 DIAGNOSIS — R05 Cough: Secondary | ICD-10-CM | POA: Diagnosis not present

## 2019-12-02 DIAGNOSIS — R7303 Prediabetes: Secondary | ICD-10-CM | POA: Diagnosis not present

## 2019-12-02 DIAGNOSIS — M519 Unspecified thoracic, thoracolumbar and lumbosacral intervertebral disc disorder: Secondary | ICD-10-CM

## 2019-12-02 DIAGNOSIS — R6889 Other general symptoms and signs: Secondary | ICD-10-CM | POA: Diagnosis not present

## 2019-12-02 DIAGNOSIS — I10 Essential (primary) hypertension: Secondary | ICD-10-CM | POA: Diagnosis not present

## 2019-12-02 DIAGNOSIS — R601 Generalized edema: Secondary | ICD-10-CM

## 2019-12-02 DIAGNOSIS — R059 Cough, unspecified: Secondary | ICD-10-CM

## 2019-12-02 DIAGNOSIS — E785 Hyperlipidemia, unspecified: Secondary | ICD-10-CM | POA: Diagnosis not present

## 2019-12-02 MED ORDER — POTASSIUM CHLORIDE CRYS ER 20 MEQ PO TBCR: 20.0000 meq | EXTENDED_RELEASE_TABLET | Freq: Every day | ORAL | 1 refills | Status: DC

## 2019-12-02 NOTE — Patient Instructions (Signed)
Please complete lab work prior to leaving. Keep your MRI of your neck appointment on 12/04/20.  You should be contacted about scheduling your appointments with the specialists.

## 2019-12-02 NOTE — Progress Notes (Signed)
Subjective:    Patient ID: Briana Miller, female    DOB: 21-Feb-1956, 63 y.o.   MRN: [id number]  HPI  Patient is a 64 year old female who presents today for follow up.  Hyperlipidemia- not on a statin.  Lab Results  Component Value Date   CHOL 187 01/21/2019   HDL 53.50 01/21/2019   LDLCALC 109 (H) 01/21/2019   TRIG 119.0 01/21/2019   CHOLHDL 3 01/21/2019   Hypokalemia- maintained on kdur.   HTN- maintained on metoprolol, losartan, demadex.   BP Readings from Last 3 Encounters:  12/02/19 131/72  11/04/19 114/80  11/04/19 138/83   Hyperglycemia-  Lab Results  Component Value Date   HGBA1C 6.2 01/21/2019   HGBA1C 6.2 07/31/2017   HGBA1C 6.1 04/27/2017   Lab Results  Component Value Date   LDLCALC 109 (H) 01/21/2019   CREATININE 1.00 09/23/2019   CHF- following with cardiology. Cardiology ordered echo/and stress test. She is trying to get these scheduled.  Weights have not been accurate I believe due to staff not subtracting the wheelchair weight.   Wt Readings from Last 3 Encounters:  10/07/19 220 lb (99.8 kg)  04/08/19 288 lb (130.6 kg)  03/11/19 240 lb (108.9 kg)   OSA- not using sleep study- this was delayed due to Covid.   She reports some left hand pain, and pain in the left side of her neck. Notes increased numbness in her left hand. This has been present for several months.  She cancelled her MRI of C spine due to her grandson's graduation.    Reports that she has a productive cough in the AM, feels like it takes a while for her to catch her breath.  Notes some low back pain.  Feels like this has been impacting her appetite. Reports that this has been present x 2 months. Reports that she last saw Dr. Ellene Route 2 years ago.    Migraines- following with neurology.    Review of Systems    see HPI  Past Medical History:  Diagnosis Date  . Acute systolic congestive heart failure ([place]) 03/01/2014  . Allergy   . Anemia   . Arthritis   .  Cardiomyopathy ([place])   . Cervical myelopathy ([place])   . CHF (congestive heart failure) ([place])   . GERD (gastroesophageal reflux disease)   . Heart disease   . Hyperlipidemia   . Hypertension   . Leg pain   . Overactive bladder      Social History   Socioeconomic History  . Marital status: Married    Spouse name: Not on file  . Number of children: 2  . Years of education: HS  . Highest education level: Not on file  Occupational History  . Occupation: Psychiatric nurse: [employer]  Tobacco Use  . Smoking status: Never Smoker  . Smokeless tobacco: Never Used  Substance and Sexual Activity  . Alcohol use: Yes    Alcohol/week: 2.0 standard drinks    Types: 2 Glasses of wine per week  . Drug use: No  . Sexual activity: Not on file  Other Topics Concern  . 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   1-2 cups caffeine daily.   Lives at home with her husband.   Social Determinants of Health   Financial Resource Strain:   . Difficulty of Paying Living Expenses:   Food Insecurity:   . Worried About Programme researcher, broadcasting/film/video in the Last Year:   . Barista in the Last Year:   Transportation Needs:   . Freight forwarder (Medical):   Marland Kitchen Lack of Transportation (Non-Medical):   Physical Activity:   . Days of Exercise per Week:   . Minutes of Exercise per Session:   Stress:   . Feeling of Stress :   Social Connections:   . Frequency of Communication with Friends and Family:   . Frequency of Social Gatherings with Friends and Family:   . Attends Religious Services:   . Active Member of Clubs or Organizations:   . Attends Banker Meetings:   Marland Kitchen Marital Status:   Intimate Partner Violence:     . Fear of Current or Ex-Partner:   . Emotionally Abused:   Marland Kitchen Physically Abused:   . Sexually Abused:     Past Surgical History:  Procedure Laterality Date  . ANTERIOR CERVICAL DECOMP/DISCECTOMY FUSION N/A 08/03/2016   Procedure: ANTERIOR CERVICAL DECOMPRESSION/DISCECTOMY FUSION C4-5, C5-6;  Surgeon: Barnett Abu, MD;  Location: [facility] OR;  Service: Neurosurgery;  Laterality: N/A;  . COLONOSCOPY    . COLONOSCOPY WITH PROPOFOL N/A 02/24/2019   Procedure: COLONOSCOPY WITH PROPOFOL;  Surgeon: Rachael Fee, MD;  Location: [facility] END[facility]COPY;  Service: Endoscopy;  Laterality: N/A;  . KNEE ARTHROSCOPY     2007  . ROTATOR CUFF REPAIR Right 03/23/13  . TOTAL KNEE ARTHROPLASTY  09/22/2011   Procedure: TOTAL KNEE ARTHROPLASTY;  Surgeon: Raymon Mutton, MD;  Location: [facility] OR;  Service: Orthopedics;  Laterality: Right;    Family History  Problem Relation Age of Onset  . Cancer Mother        colon 78 and breast 69- deceased  . Colon cancer Mother   . Diabetes Father        living  . Asthma Father   . Hypertension Father   . Heart attack Father 48       medical management per pt  . Glaucoma Father        had eye transplant  . Kidney failure Father        acute renal failure- died 53  . Esophageal cancer Neg Hx   . Stomach cancer Neg Hx   . Rectal cancer Neg Hx     No Known Allergies  Current Outpatient Medications on File Prior to Visit  Medication Sig Dispense Refill  . Cholecalciferol (VITAMIN D3 PO) Take 1 tablet by mouth daily.    Marland Kitchen gabapentin (NEURONTIN) 300 MG capsule Take 3 capsules (900 mg total) by mouth 3 (three) times daily. 810 capsule 1  . latanoprost (XALATAN) 0.005 % ophthalmic solution Place 1 drop into both eyes at bedtime.  11  . losartan (COZAAR) 100 MG tablet Take 1 tablet (100 mg total) by mouth daily. 90 tablet 3  . Melatonin 3 MG TABS Take 1 tablet by mouth at bedtime.    . metoprolol succinate (TOPROL-XL) 50 MG 24 hr tablet Take 1 tablet (50 mg total) by mouth daily.  Take with or immediately following a meal. 90 tablet 1  . nortriptyline (PAMELOR) 10 MG capsule Take 1 capsule at bedtime for a week, then 2 capsules at bedtime. 60 capsule 0  .  NYAMYC powder APPLY TO AFFECTED AREA(S)  TOPICALLY TWICE DAILY 180 g 1  . ondansetron (ZOFRAN) 4 MG tablet Take one tablet every 4-6 hours as needed for nausea 30 tablet 2  . pantoprazole (PROTONIX) 40 MG tablet Take 1 tablet (40 mg total) by mouth 2 (two) times daily. 180 tablet 1  . potassium chloride SA (KLOR-CON) 20 MEQ tablet Take 1 tablet (20 mEq total) by mouth daily. 90 tablet 1  . timolol (BETIMOL) 0.5 % ophthalmic solution Place 1 drop into both eyes every morning.    . torsemide (DEMADEX) 10 MG tablet Take one tablet alternating with 2 tablets every other day 135 tablet 1   No current facility-administered medications on file prior to visit.    BP 131/72 (BP Location: Right Arm, Patient Position: Sitting, Cuff Size: Large)   Pulse 88   Temp 97.8 F (36.6 C) (Temporal)   Resp 16   LMP 06/17/2007   SpO2 100%    Objective:   Physical Exam Constitutional:      Appearance: She is well-developed.  Neck:     Thyroid: No thyromegaly.  Cardiovascular:     Rate and Rhythm: Normal rate and regular rhythm.     Heart sounds: Normal heart sounds. No murmur heard.   Pulmonary:     Effort: Pulmonary effort is normal. No respiratory distress.     Breath sounds: Normal breath sounds. No wheezing.  Musculoskeletal:     Cervical back: Neck supple.  Skin:    General: Skin is warm and dry.  Neurological:     Mental Status: She is alert and oriented to person, place, and time.     Comments: Bilateral hand grasps 4/5  Psychiatric:        Behavior: Behavior normal.        Thought Content: Thought content normal.        Judgment: Judgment normal.           Assessment & Plan:  HTN- bp stable on current regimen. Check follow up bmet.  Cervical myelopathy- I encouraged pt to follow through with her  scheduled MRI C-spine. Case was reviewed with her neurologist today- Dr. Everlena Cooper.   Hypokalemia- obtain follow up bmet. Continue kdur.  Cough/OSA- new cough. Normal lung exam. Will refer to pulmonary for assessment of cough and re-evaluation of her OSA. She does report that she is often sleepy during the day.  Hyperglycemia- obtain follow up A1C.  Hyperlipidemia- obtain follow up lipid panel.  40 minutes spent on today's visit. Time was spent reviewing pt record, examining patient, reviewing with her neurologist and providing counseling about plan of care.  This visit occurred during the SARS-CoV-2 public health emergency.  Safety protocols were in place, including screening questions prior to the visit, additional usage of staff PPE, and extensive cleaning of exam room while observing appropriate contact time as indicated for disinfecting solutions.

## 2019-12-03 LAB — LIPID PANEL
Cholesterol: 170 mg/dL (ref <200)
HDL: 49 mg/dL — ABNORMAL LOW (ref >=50)
LDL Cholesterol (Calc): 97 mg/dL (calc)
Non-HDL Cholesterol (Calc): 121 mg/dL (calc) (ref <130)
Total CHOL/HDL Ratio: 3.5 (calc) (ref <5.0)
Triglycerides: 140 mg/dL (ref <150)

## 2019-12-03 LAB — HEMOGLOBIN A1C
Hgb A1c MFr Bld: 6 % of total Hgb — ABNORMAL HIGH (ref <5.7)
Mean Plasma Glucose: 126 (calc)
eAG (mmol/L): 7 (calc)

## 2019-12-03 LAB — BASIC METABOLIC PANEL
BUN: 13 mg/dL (ref 7–25)
CO2: 28 mmol/L (ref 20–32)
Calcium: 9.6 mg/dL (ref 8.6–10.4)
Chloride: 105 mmol/L (ref 98–110)
Creat: 0.95 mg/dL (ref 0.50–0.99)
Glucose, Bld: 105 mg/dL — ABNORMAL HIGH (ref 65–99)
Potassium: 4.1 mmol/L (ref 3.5–5.3)
Sodium: 141 mmol/L (ref 135–146)

## 2019-12-05 ENCOUNTER — Ambulatory Visit: Payer: Medicare Other | Admitting: Physician Assistant

## 2019-12-05 ENCOUNTER — Encounter: Payer: Self-pay | Admitting: Family

## 2019-12-06 ENCOUNTER — Ambulatory Visit (HOSPITAL_COMMUNITY): Payer: Medicare Other

## 2019-12-08 ENCOUNTER — Telehealth: Payer: Self-pay

## 2019-12-08 NOTE — Telephone Encounter (Signed)
Patient called in to speak with the nurse about her power Wheel chair. Please give the patient a call at [phone] as soon as possible.

## 2019-12-09 ENCOUNTER — Encounter (HOSPITAL_COMMUNITY): Payer: Self-pay

## 2019-12-09 ENCOUNTER — Telehealth: Payer: Self-pay

## 2019-12-09 ENCOUNTER — Ambulatory Visit (HOSPITAL_COMMUNITY): Admission: RE | Admit: 2019-12-09 | Payer: Medicare Other | Source: Ambulatory Visit

## 2019-12-09 ENCOUNTER — Other Ambulatory Visit: Payer: Self-pay

## 2019-12-09 MED ORDER — NORTRIPTYLINE HCL 25 MG PO CAPS: ORAL_CAPSULE | ORAL | 0 refills | Status: DC

## 2019-12-09 NOTE — Telephone Encounter (Signed)
Called a few times but no answer and mailbox was full.  

## 2019-12-09 NOTE — Telephone Encounter (Signed)
Talked to patient, forms need to be fax again with ov note. Will fax.

## 2019-12-09 NOTE — Telephone Encounter (Signed)
Refill request received from [facility] for Nortriptyline cap.

## 2019-12-09 NOTE — Telephone Encounter (Signed)
OK to send prescription of nortriptyline 25mg  at bedtime.

## 2019-12-12 ENCOUNTER — Encounter: Payer: Self-pay | Admitting: Family

## 2019-12-12 NOTE — Telephone Encounter (Signed)
Forms faxed again with office visit note

## 2019-12-14 ENCOUNTER — Telehealth: Payer: Self-pay | Admitting: Family

## 2019-12-14 NOTE — Telephone Encounter (Signed)
Patient had a message to contact Windell Moulding. Please call once you are back in the building. Thanks

## 2019-12-14 NOTE — Telephone Encounter (Signed)
Patient scheduled for virtual visit on Friday at 3:20 to discuss mobility problems as requested by [employer] powered mobility

## 2019-12-15 NOTE — Telephone Encounter (Signed)
Patient has been advised wheelchair company is requesting a new ov to assess her mobility. She was scheduled for virtual visit on friday

## 2019-12-16 ENCOUNTER — Encounter: Payer: Self-pay | Admitting: Family

## 2019-12-16 ENCOUNTER — Ambulatory Visit (HOSPITAL_COMMUNITY)
Admission: RE | Admit: 2019-12-16 | Discharge: 2019-12-16 | Disposition: A | Discharge status: Home / Self Care | Payer: Medicare Other | Source: Ambulatory Visit | Attending: Cardiology | Admitting: Cardiology

## 2019-12-16 ENCOUNTER — Telehealth (INDEPENDENT_AMBULATORY_CARE_PROVIDER_SITE_OTHER): Payer: Medicare Other | Admitting: Family

## 2019-12-16 ENCOUNTER — Other Ambulatory Visit: Payer: Self-pay

## 2019-12-16 VITALS — Ht 66.0 in | Wt 240.0 lb

## 2019-12-16 DIAGNOSIS — K219 Gastro-esophageal reflux disease without esophagitis: Secondary | ICD-10-CM | POA: Diagnosis not present

## 2019-12-16 DIAGNOSIS — R072 Precordial pain: Secondary | ICD-10-CM | POA: Diagnosis not present

## 2019-12-16 DIAGNOSIS — I110 Hypertensive heart disease with heart failure: Secondary | ICD-10-CM | POA: Insufficient documentation

## 2019-12-16 DIAGNOSIS — Z01818 Encounter for other preprocedural examination: Secondary | ICD-10-CM | POA: Diagnosis not present

## 2019-12-16 DIAGNOSIS — I509 Heart failure, unspecified: Secondary | ICD-10-CM | POA: Insufficient documentation

## 2019-12-16 DIAGNOSIS — Z01810 Encounter for preprocedural cardiovascular examination: Secondary | ICD-10-CM | POA: Diagnosis not present

## 2019-12-16 DIAGNOSIS — G959 Disease of spinal cord, unspecified: Secondary | ICD-10-CM | POA: Diagnosis not present

## 2019-12-16 DIAGNOSIS — R6889 Other general symptoms and signs: Secondary | ICD-10-CM | POA: Diagnosis not present

## 2019-12-16 DIAGNOSIS — R0602 Shortness of breath: Secondary | ICD-10-CM

## 2019-12-16 NOTE — Progress Notes (Signed)
Virtual Visit via Video Note  I connected with Briana Miller on 12/16/19 at  3:20 PM EDT by a video enabled telemedicine application and verified that I am speaking with the correct person using two identifiers.  Location: Patient: home Provider: work   I discussed the limitations of evaluation and management by telemedicine and the availability of in person appointments. The patient expressed understanding and agreed to proceed. Only the patient and myself were present for today's video call.   History of Present Illness:  Patient is a 64 year old female who presents today for a "face to face" visit to discuss need for an electronic wheelchair.  Due to her upper and lower extremity weakness she is unable to effectively operate a manual wheelchair.  As a result she is dependent on family members to push her around the house and to bring her to the restroom in a manual wheelchair.  She would like to obtain a motorized wheelchair to assist her both inside the home and when she leaves the home to go to doctor's appointments.   Past Medical History:  Diagnosis Date  . Acute systolic congestive heart failure ([facility]) 03/01/2014  . Allergy   . Anemia   . Arthritis   . Cardiomyopathy ([facility])   . Cervical myelopathy ([facility])   . CHF (congestive heart failure) ([facility])   . GERD (gastroesophageal reflux disease)   . Heart disease   . Hyperlipidemia   . Hypertension   . Leg pain   . Overactive bladder      Social History   Socioeconomic History  . Marital status: Married    Spouse name: Not on file  . Number of children: 2  . Years of education: HS  . Highest education level: Not on file  Occupational History  . 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 Smoking status: Never Smoker  . Smokeless tobacco: Never Used  Substance and Sexual Activity  . Alcohol use: Yes    Alcohol/week: 2.0 standard drinks    Types: 2 Glasses of wine per week  . Drug use: No  .  Sexual activity: Not on file  Other Topics Concern  . 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   1-2 cups caffeine daily.   Lives at home with her husband.   Social Determinants of Health   Financial Resource Strain:   . Difficulty of Paying Living Expenses:   Food Insecurity:   . Worried About Programme researcher, broadcasting/film/video in the Last Year:   . Barista in the Last Year:   Transportation Needs:   . Freight forwarder (Medical):   Marland Kitchen Lack of Transportation (Non-Medical):   Physical Activity:   . Days of Exercise per Week:   . Minutes of Exercise per Session:   Stress:   . Feeling of Stress :   Social Connections:   . Frequency of Communication with Friends and Family:   . Frequency of Social Gatherings with Friends and Family:   . Attends Religious Services:   . Active Member of Clubs or Organizations:   . Attends Banker Meetings:   Marland Kitchen Marital  Status:   Intimate Partner Violence:   . Fear of Current or Ex-Partner:   . Emotionally Abused:   Marland Kitchen Physically Abused:   . Sexually Abused:     Past Surgical History:  Procedure Laterality Date  . ANTERIOR CERVICAL DECOMP/DISCECTOMY FUSION N/A 08/03/2016   Procedure: ANTERIOR CERVICAL DECOMPRESSION/DISCECTOMY FUSION C4-5, C5-6;  Surgeon: Barnett Abu, MD;  Location: [facility] OR;  Service: Neurosurgery;  Laterality: N/A;  . COLONOSCOPY    . COLONOSCOPY WITH PROPOFOL N/A 02/24/2019   Procedure: COLONOSCOPY WITH PROPOFOL;  Surgeon: Rachael Fee, MD;  Location: [facility];  Service: Endoscopy;  Laterality: N/A;  . KNEE ARTHROSCOPY     2007  . ROTATOR CUFF REPAIR Right 03/23/13  . TOTAL KNEE ARTHROPLASTY  09/22/2011   Procedure: TOTAL KNEE ARTHROPLASTY;   Surgeon: Raymon Mutton, MD;  Location: [facility] OR;  Service: Orthopedics;  Laterality: Right;    Family History  Problem Relation Age of Onset  . Cancer Mother        colon 44 and breast 37- deceased  . Colon cancer Mother   . Diabetes Father        living  . Asthma Father   . Hypertension Father   . Heart attack Father 46       medical management per pt  . Glaucoma Father        had eye transplant  . Kidney failure Father        acute renal failure- died 63  . Esophageal cancer Neg Hx   . Stomach cancer Neg Hx   . Rectal cancer Neg Hx     No Known Allergies  Current Outpatient Medications on File Prior to Visit  Medication Sig Dispense Refill  . Cholecalciferol (VITAMIN D3 PO) Take 1 tablet by mouth daily.    Marland Kitchen gabapentin (NEURONTIN) 300 MG capsule Take 3 capsules (900 mg total) by mouth 3 (three) times daily. 810 capsule 1  . latanoprost (XALATAN) 0.005 % ophthalmic solution Place 1 drop into both eyes at bedtime.  11  . losartan (COZAAR) 100 MG tablet Take 1 tablet (100 mg total) by mouth daily. 90 tablet 3  . Melatonin 3 MG TABS Take 1 tablet by mouth at bedtime.    . metoprolol succinate (TOPROL-XL) 50 MG 24 hr tablet Take 1 tablet (50 mg total) by mouth daily. Take with or immediately following a meal. 90 tablet 1  . nortriptyline (PAMELOR) 25 MG capsule Take 1 capsule at bedtime for a week, then 2 capsules at bedtime. 30 capsule 0  . NYAMYC powder APPLY TO AFFECTED AREA(S)  TOPICALLY TWICE DAILY 180 g 1  . ondansetron (ZOFRAN) 4 MG tablet Take one tablet every 4-6 hours as needed for nausea 30 tablet 2  . pantoprazole (PROTONIX) 40 MG tablet Take 1 tablet (40 mg total) by mouth 2 (two) times daily. 180 tablet 1  . potassium chloride SA (KLOR-CON) 20 MEQ tablet Take 1 tablet (20 mEq total) by mouth daily. 90 tablet 1  . timolol (BETIMOL) 0.5 % ophthalmic solution Place 1 drop into both eyes every morning.    . torsemide (DEMADEX) 10 MG tablet Take one tablet alternating with  2 tablets every other day 135 tablet 1   No current facility-administered medications on file prior to visit.    Ht 5\' 6"  (1.676 m)   Wt 240 lb (108.9 kg)   LMP 06/17/2007   BMI 38.74 kg/m    Observations/Objective:   Gen: Awake, alert, no acute distress  Resp: Breathing is even and non-labored Psych: calm/pleasant demeanor Neuro: Alert and Oriented x 3, + facial symmetry, speech is clear.   Assessment and Plan:  Cervical myelopathy-as a result of her lower extremity paralysis and severe upper extremity weakness, she is unable to utilize a manual wheelchair independently.  I feel that it is medically indicated for her to be approved for a motorized wheelchair due to her debilitation.  15 minutes spent on today's visit.  Majority of the time was spent counseling the patient and reviewing her chart.  Of note, the patient has an upcoming MRI of the C-spine.  She does report that she has some pain radiating into the left shoulder.  This could be a cervical radiculopathy.  We will see how her C-spine MRI looks.  We discussed that if this is unrevealing and her symptoms persist to come see me in the office for musculoskeletal evaluation.  Patient verbalizes understanding.   Follow Up Instructions:    I discussed the assessment and treatment plan with the patient. The patient was provided an opportunity to ask questions and all were answered. The patient agreed with the plan and demonstrated an understanding of the instructions.   The patient was advised to call back or seek an in-person evaluation if the symptoms worsen or if the condition fails to improve as anticipated.  Lemont Fillers, NP

## 2019-12-16 NOTE — Progress Notes (Signed)
  Echocardiogram 2D Echocardiogram has been performed.  Briana Miller 12/16/2019, 1:41 PM

## 2019-12-22 ENCOUNTER — Encounter: Payer: Self-pay | Admitting: Family

## 2019-12-23 ENCOUNTER — Telehealth: Payer: Self-pay | Admitting: *Deleted

## 2019-12-23 ENCOUNTER — Encounter: Payer: Self-pay | Admitting: *Deleted

## 2019-12-23 DIAGNOSIS — I255 Ischemic cardiomyopathy: Secondary | ICD-10-CM

## 2019-12-23 MED ORDER — METOPROLOL TARTRATE 50 MG PO TABS: ORAL_TABLET | ORAL | 0 refills | Status: DC

## 2019-12-23 NOTE — Telephone Encounter (Addendum)
-----   Message from Lewayne Bunting, MD sent at 12/17/2019  7:08 AM EDT ----- Cancel nuclear study; schedule cardiac CTA to R/O CAD and then fu with me Olga Millers   pt aware of results Order placed and Follow up scheduled  Letter of instructions for CTA mailed to the patient

## 2019-12-26 ENCOUNTER — Telehealth: Payer: Self-pay | Admitting: Family

## 2019-12-26 ENCOUNTER — Encounter: Payer: Self-pay | Admitting: Family

## 2019-12-26 NOTE — Telephone Encounter (Signed)
OV notes from 12/16/2019 faxed to Clydie Braun at [phone] and [phone] (fax numbers provided by Clydie Braun, number below incorrect).    LM for Clydie Braun to call back with receipt confirmation, direct line provided.

## 2019-12-26 NOTE — Telephone Encounter (Signed)
Caller:Karen ([facility]) Call back phone number: [phone] ext [phone]  Fax Number: [phone]  Clydie Braun states she still needs office notes from 12/16/19.

## 2019-12-30 ENCOUNTER — Ambulatory Visit (HOSPITAL_COMMUNITY): Admission: RE | Admit: 2019-12-30 | Payer: Medicare Other | Source: Ambulatory Visit

## 2019-12-31 ENCOUNTER — Ambulatory Visit (HOSPITAL_BASED_OUTPATIENT_CLINIC_OR_DEPARTMENT_OTHER): Payer: Medicare Other

## 2020-01-03 ENCOUNTER — Other Ambulatory Visit: Payer: Self-pay | Admitting: Family

## 2020-01-03 DIAGNOSIS — R601 Generalized edema: Secondary | ICD-10-CM

## 2020-01-05 NOTE — Progress Notes (Signed)
Virtual Visit via Video Note The purpose of this virtual visit is to provide medical care while limiting exposure to the novel coronavirus.    Consent was obtained for video visit:  Yes Answered questions that patient had about telehealth interaction:  Yes I discussed the limitations, risks, security and privacy concerns of performing an evaluation and management service by telemedicine. I also discussed with the patient that there may be a patient responsible charge related to this service. The patient expressed understanding and agreed to proceed.  Pt location: Home Physician Location: office Name of referring provider:  Sandford Craze, NP I connected with Rod Can at patients initiation/request on 01/09/2020 at  9:50 AM EDT by video enabled telemedicine application and verified that I am speaking with the correct person using two identifiers. Pt MRN:  [id number] Pt DOB:  1955/08/09 Video Participants:  Rod Can   History of Present Illness:  Briana Miller is a 64 year old left-handed female with cardiomyopathy, CHF, HTN, HLD and history of cervical myelopathy status post ACDF who follows up for upper and lower extremity pain and weakness.  PCP notes reviewed.  UPDATE: MRI of brain with and without contrast on 11/23/2019 personally reviewed and was unremarkable.  She was started on nortriptyline.  She began experiencing bilateral upper and lower extremity weakness.  It paricularly notes pain in the left arm, like her muscle is going to pop.  She notes numbness in the thumb and index finger.  She also has low back pain.  Every now and then, she notes sharp pain down the legs.  MRI of lumbar spine from 2018 was personally reviewed and did show spinal stenosis at L4-5.  MRI of cervical spine performed on 01/07/2020 personally reviewed showed mild spondylosis and ACDF C4-5 and C5-6 with possible residual cord signal at C4-5, although imaging limited by motion  artifact.  No significant stenosis.    Current NSAIDS:  none Current analgesics:  none Current anti-emetic:  Zofran 4mg  Current muscle relaxants:  none Current sleep aide:  melatonin Current Antihypertensive medications:  Toprol-XL 50mg ; losartan; torsemide Current Antidepressant medications:  nortriptyline 10mg  at bedtimem. Current Anticonvulsant medications:  Gabapentin 900mg  TID Current anti-CGRP:  none Current Vitamins/Herbal/Supplements:  Melatonin; KCl Current Antihistamines/Decongestants:  Meclizine   HISTORY: She began experiencing positional headache and dizziness several months ago.  It occurs with fast movements, mostly when laying down or getting up from bed or turning her head to the right.  The dizziness is a spinning sensation that lasts 2 to 3 minutes.  The headache is a severe frontal pressure-like headache that lasts 5 to 10 minutes.  There is associated nausea and fuzzy vision but no vomiting, double vision, hearing loss, numbness or weakness.  It initially occurred all the time but now only 2 to 3 times a week.  When she turns her neck to the right, it feels like the right side of her neck "catches".  She initially was treated for sinusitis and was then prescribed meclizine, which helped.    Headaches when move sudden, spinning several months.  Move fast but mostly laying down or getting up from bed.  2 to 3 minutes.  Was daily for a while and now 2 to 3 times a week.  If turn sudden, feels like something in right side of neck catches.  She also reports ringing in her ears for the past 2 years.    In 2018, she developed lower extremity weakness and loss of bowel  and bladder control.  She was found to have spinal stenosis due to degenerative discs at C4-5 and C5-6 causing myelopathy.  She underwent ACDF.  She has been in a wheelchair for most of the past 2 years.      PAST MEDICAL HISTORY: Past Medical History:  Diagnosis Date  . Acute systolic congestive heart  failure (HCC) 03/01/2014  . Allergy   . Anemia   . Arthritis   . Cardiomyopathy (HCC)   . Cervical myelopathy (HCC)   . CHF (congestive heart failure) (HCC)   . GERD (gastroesophageal reflux disease)   . Heart disease   . Hyperlipidemia   . Hypertension   . Leg pain   . Overactive bladder     MEDICATIONS: Current Outpatient Medications on File Prior to Visit  Medication Sig Dispense Refill  . Cholecalciferol (VITAMIN D3 PO) Take 1 tablet by mouth daily.    Marland Kitchen gabapentin (NEURONTIN) 300 MG capsule Take 3 capsules (900 mg total) by mouth 3 (three) times daily. 810 capsule 1  . latanoprost (XALATAN) 0.005 % ophthalmic solution Place 1 drop into both eyes at bedtime.  11  . losartan (COZAAR) 100 MG tablet Take 1 tablet (100 mg total) by mouth daily. 90 tablet 3  . Melatonin 3 MG TABS Take 1 tablet by mouth at bedtime.    . metoprolol succinate (TOPROL-XL) 50 MG 24 hr tablet Take 1 tablet (50 mg total) by mouth daily. Take with or immediately following a meal. 90 tablet 1  . metoprolol tartrate (LOPRESSOR) 50 MG tablet TAKE 2 HOURS PRIOR TO CT SCAN 1 tablet 0  . nortriptyline (PAMELOR) 25 MG capsule Take 1 capsule at bedtime for a week, then 2 capsules at bedtime. 30 capsule 0  . NYAMYC powder APPLY TO AFFECTED AREA(S)  TOPICALLY TWICE DAILY 180 g 1  . ondansetron (ZOFRAN) 4 MG tablet Take one tablet every 4-6 hours as needed for nausea 30 tablet 2  . pantoprazole (PROTONIX) 40 MG tablet Take 1 tablet (40 mg total) by mouth 2 (two) times daily. 180 tablet 1  . potassium chloride SA (KLOR-CON) 20 MEQ tablet Take 1 tablet (20 mEq total) by mouth daily. 90 tablet 1  . timolol (BETIMOL) 0.5 % ophthalmic solution Place 1 drop into both eyes every morning.    . torsemide (DEMADEX) 10 MG tablet TAKE 1 TABLET BY MOUTH  ALTERNATING WITH 2 TABLETS  BY MOUTH EVERY OTHER DAY 135 tablet 3   No current facility-administered medications on file prior to visit.    ALLERGIES: No Known  Allergies  FAMILY HISTORY: Family History  Problem Relation Age of Onset  . Cancer Mother        colon 27 and breast 63- deceased  . Colon cancer Mother   . Diabetes Father        living  . Asthma Father   . Hypertension Father   . Heart attack Father 14       medical management per pt  . Glaucoma Father        had eye transplant  . Kidney failure Father        acute renal failure- died 18  . Esophageal cancer Neg Hx   . Stomach cancer Neg Hx   . Rectal cancer Neg Hx    SOCIAL HISTORY: Social History   Socioeconomic History  . Marital status: Married    Spouse name: Not on file  . Number of children: 2  . Years of education: HS  .  Highest education level: Not on file  Occupational History  . 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 Smoking status: Never Smoker  . Smokeless tobacco: Never Used  Substance and Sexual Activity  . Alcohol use: Yes    Alcohol/week: 2.0 standard drinks    Types: 2 Glasses of wine per week  . Drug use: No  . Sexual activity: Not on file  Other Topics Concern  . 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   1-2 cups caffeine daily.   Lives at home with her husband.   Social Determinants of Health   Financial Resource Strain:   . Difficulty of Paying Living Expenses:   Food Insecurity:   . Worried About Programme researcher, broadcasting/film/video in the Last Year:   . Barista in the Last Year:   Transportation Needs:   . Freight forwarder (Medical):   Marland Kitchen Lack of Transportation (Non-Medical):   Physical Activity:   . Days of Exercise per Week:   . Minutes of Exercise per Session:   Stress:   . Feeling of Stress :   Social Connections:   . Frequency of  Communication with Friends and Family:   . Frequency of Social Gatherings with Friends and Family:   . Attends Religious Services:   . Active Member of Clubs or Organizations:   . Attends Banker Meetings:   Marland Kitchen Marital Status:   Intimate Partner Violence:   . Fear of Current or Ex-Partner:   . Emotionally Abused:   Marland Kitchen Physically Abused:   . Sexually Abused:     Objective: Height 5\' 6"  (1.676 m), weight (!) 220 lb (99.8 kg), last menstrual period 06/17/2007.  Assessment & Plan: 1.  History of cervical myelopathy 2.  Left arm pain and weakness.  Some residual chronic cervical cord signal change but no evidence of recurrent spinal stenosis.  Mild left neural foraminal stenosis at C6-C7 which potentially may cause pain but definitely nothing warranting surgery. 3.  Lumbar spinal stenosis at L4-5 as demonstrated on prior MRI from 2018.  With chronic low back pain with occasional claudication.  1.  Will order physical therapy for left upper extremity and low back pain. 2.  Follow up 4 to 6 months.   Follow Up Instructions:    -I discussed the assessment and treatment plan with the patient. The patient was provided an opportunity to ask questions and all were answered. The patient agreed with the plan and demonstrated an understanding of the instructions.   The patient was advised to call back or seek an in-person evaluation if the symptoms worsen or if the condition fails to improve as anticipated.     2019, DO

## 2020-01-06 ENCOUNTER — Ambulatory Visit: Payer: Medicare Other | Admitting: Physician Assistant

## 2020-01-07 ENCOUNTER — Other Ambulatory Visit: Payer: Self-pay

## 2020-01-07 ENCOUNTER — Ambulatory Visit (HOSPITAL_BASED_OUTPATIENT_CLINIC_OR_DEPARTMENT_OTHER)
Admission: RE | Admit: 2020-01-07 | Discharge: 2020-01-07 | Disposition: A | Discharge status: Home / Self Care | Payer: Medicare Other | Source: Ambulatory Visit | Attending: Neurology | Admitting: Neurology

## 2020-01-07 DIAGNOSIS — R42 Dizziness and giddiness: Secondary | ICD-10-CM

## 2020-01-07 DIAGNOSIS — M542 Cervicalgia: Secondary | ICD-10-CM | POA: Diagnosis not present

## 2020-01-07 DIAGNOSIS — R51 Headache with orthostatic component, not elsewhere classified: Secondary | ICD-10-CM | POA: Diagnosis not present

## 2020-01-07 DIAGNOSIS — R519 Headache, unspecified: Secondary | ICD-10-CM | POA: Diagnosis not present

## 2020-01-09 ENCOUNTER — Other Ambulatory Visit: Payer: Self-pay

## 2020-01-09 ENCOUNTER — Telehealth (INDEPENDENT_AMBULATORY_CARE_PROVIDER_SITE_OTHER): Payer: Medicare Other | Admitting: Neurology

## 2020-01-09 ENCOUNTER — Other Ambulatory Visit: Payer: Self-pay | Admitting: *Deleted

## 2020-01-09 ENCOUNTER — Encounter: Payer: Self-pay | Admitting: Neurology

## 2020-01-09 VITALS — Ht 66.0 in | Wt 220.0 lb

## 2020-01-09 DIAGNOSIS — R072 Precordial pain: Secondary | ICD-10-CM

## 2020-01-09 DIAGNOSIS — M79602 Pain in left arm: Secondary | ICD-10-CM

## 2020-01-09 DIAGNOSIS — M48062 Spinal stenosis, lumbar region with neurogenic claudication: Secondary | ICD-10-CM

## 2020-01-09 DIAGNOSIS — G959 Disease of spinal cord, unspecified: Secondary | ICD-10-CM

## 2020-01-09 NOTE — Patient Instructions (Signed)
Physical therapy for left arm and back pain.

## 2020-01-12 ENCOUNTER — Other Ambulatory Visit: Payer: Self-pay | Admitting: Family

## 2020-01-12 ENCOUNTER — Other Ambulatory Visit: Payer: Self-pay | Admitting: Physician Assistant

## 2020-01-12 ENCOUNTER — Other Ambulatory Visit: Payer: Self-pay | Admitting: Neurology

## 2020-01-12 MED ORDER — GABAPENTIN 300 MG PO CAPS: 900.0000 mg | ORAL_CAPSULE | Freq: Three times a day (TID) | ORAL | 1 refills | Status: DC

## 2020-01-12 NOTE — Addendum Note (Signed)
Addended byConrad Mentor D on: 01/12/2020 10:35 AM   Modules accepted: Orders

## 2020-01-13 DIAGNOSIS — R072 Precordial pain: Secondary | ICD-10-CM | POA: Diagnosis not present

## 2020-01-14 ENCOUNTER — Encounter: Payer: Self-pay | Admitting: Family

## 2020-01-14 LAB — BASIC METABOLIC PANEL
BUN/Creatinine Ratio: 23 (ref 12–28)
BUN: 27 mg/dL (ref 8–27)
CO2: 25 mmol/L (ref 20–29)
Calcium: 9.7 mg/dL (ref 8.7–10.3)
Chloride: 101 mmol/L (ref 96–106)
Creatinine, Ser: 1.17 mg/dL — ABNORMAL HIGH (ref 0.57–1.00)
GFR calc Af Amer: 58 mL/min/{1.73_m2} — ABNORMAL LOW (ref >59)
GFR calc non Af Amer: 50 mL/min/{1.73_m2} — ABNORMAL LOW (ref >59)
Glucose: 97 mg/dL (ref 65–99)
Potassium: 4.6 mmol/L (ref 3.5–5.2)
Sodium: 140 mmol/L (ref 134–144)

## 2020-01-16 NOTE — Telephone Encounter (Signed)
Can you advise on numbers to make sure she is ok for now?

## 2020-01-16 NOTE — Telephone Encounter (Signed)
Kidney function has dec -- due to cr creeping up and gfr down Pt should drink plenty of water and f/u with [facility]

## 2020-01-17 NOTE — Telephone Encounter (Signed)
Yes-- cr only slightly elevated

## 2020-01-18 ENCOUNTER — Encounter: Payer: Self-pay | Admitting: Family

## 2020-01-18 ENCOUNTER — Telehealth (HOSPITAL_COMMUNITY): Payer: Self-pay | Admitting: *Deleted

## 2020-01-18 NOTE — Telephone Encounter (Signed)
Attempted to call patient regarding upcoming cardiac CT appointment. The voicemail is full and unable to leave a voicemail.  Will attempt to call pt again.  Burley Saver RN Navigator Cardiac Imaging [facility] [phone] Office [phone] Cell

## 2020-01-19 ENCOUNTER — Telehealth (HOSPITAL_COMMUNITY): Payer: Self-pay | Admitting: *Deleted

## 2020-01-19 DIAGNOSIS — G959 Disease of spinal cord, unspecified: Secondary | ICD-10-CM | POA: Diagnosis not present

## 2020-01-19 DIAGNOSIS — G8220 Paraplegia, unspecified: Secondary | ICD-10-CM | POA: Diagnosis not present

## 2020-01-19 NOTE — Telephone Encounter (Signed)
Reaching out to patient to offer assistance regarding upcoming cardiac imaging study; pt verbalizes understanding of appt date/time, parking situation and where to check in, pre-test NPO status and medications ordered, and verified current allergies; name and call back number provided for further questions should they arise  Briana Miller and Vascular [phone] office [phone] cell

## 2020-01-20 ENCOUNTER — Ambulatory Visit (HOSPITAL_COMMUNITY)
Admission: RE | Admit: 2020-01-20 | Discharge: 2020-01-20 | Disposition: A | Discharge status: Home / Self Care | Payer: Medicare Other | Source: Ambulatory Visit | Attending: Cardiology | Admitting: Cardiology

## 2020-01-20 ENCOUNTER — Encounter: Payer: Medicare Other | Admitting: *Deleted

## 2020-01-20 ENCOUNTER — Other Ambulatory Visit: Payer: Self-pay

## 2020-01-20 DIAGNOSIS — I255 Ischemic cardiomyopathy: Secondary | ICD-10-CM | POA: Insufficient documentation

## 2020-01-20 DIAGNOSIS — Z006 Encounter for examination for normal comparison and control in clinical research program: Secondary | ICD-10-CM

## 2020-01-20 MED ORDER — NITROGLYCERIN 0.4 MG SL SUBL
0.8000 mg | SUBLINGUAL_TABLET | Freq: Once | SUBLINGUAL | Status: AC
  Administered 2020-01-20: 0.8 mg via SUBLINGUAL

## 2020-01-20 MED ORDER — IOHEXOL 350 MG/ML SOLN
80.0000 mL | Freq: Once | INTRAVENOUS | Status: AC | PRN
  Administered 2020-01-20: 80 mL via INTRAVENOUS

## 2020-01-20 MED ORDER — METOPROLOL TARTRATE 5 MG/5ML IV SOLN
INTRAVENOUS | Status: AC
  Filled 2020-01-20: qty 15

## 2020-01-20 MED ORDER — DILTIAZEM HCL 25 MG/5ML IV SOLN
INTRAVENOUS | Status: AC
  Filled 2020-01-20: qty 5

## 2020-01-20 MED ORDER — NITROGLYCERIN 0.4 MG SL SUBL
SUBLINGUAL_TABLET | SUBLINGUAL | Status: AC
  Filled 2020-01-20: qty 2

## 2020-01-20 MED ORDER — DILTIAZEM HCL 25 MG/5ML IV SOLN
5.0000 mg | Freq: Once | INTRAVENOUS | Status: AC
  Administered 2020-01-20: 5 mg via INTRAVENOUS
  Filled 2020-01-20: qty 5

## 2020-01-20 MED ORDER — METOPROLOL TARTRATE 5 MG/5ML IV SOLN
5.0000 mg | INTRAVENOUS | Status: DC | PRN
  Administered 2020-01-20 (×3): 5 mg via INTRAVENOUS

## 2020-01-20 NOTE — Research (Signed)
CADFEM Informed Consent                  Subject Name:   Briana Miller   Subject met inclusion and exclusion criteria.  The informed consent form, study requirements and expectations were reviewed with the subject and questions and concerns were addressed prior to the signing of the consent form.  The subject verbalized understanding of the trial requirements.  The subject agreed to participate in the CADFEM trial and signed the informed consent.  The informed consent was obtained prior to performance of any protocol-specific procedures for the subject.  A copy of the signed informed consent was given to the subject and a copy was placed in the subject's medical record.   [place] Joseandres Mazer, Research Assistant  01/20/2020  11:22 a.m.

## 2020-01-23 NOTE — Telephone Encounter (Signed)
Daughter: Ashlay Altieri Call back # [phone]  Glee Arvin would like to know if you received FMLA paperwork?

## 2020-01-25 ENCOUNTER — Telehealth: Payer: Self-pay | Admitting: Neurology

## 2020-01-25 ENCOUNTER — Other Ambulatory Visit: Payer: Self-pay

## 2020-01-25 DIAGNOSIS — M79602 Pain in left arm: Secondary | ICD-10-CM

## 2020-01-25 DIAGNOSIS — M48062 Spinal stenosis, lumbar region with neurogenic claudication: Secondary | ICD-10-CM

## 2020-01-25 NOTE — Telephone Encounter (Signed)
Patient called in stating she needed to do physical therapy, but wondered if there was any way to do in home physical therapy?

## 2020-01-25 NOTE — Telephone Encounter (Signed)
Home health referral for Pt added to the chart.

## 2020-02-01 ENCOUNTER — Other Ambulatory Visit: Payer: Self-pay | Admitting: Cardiology

## 2020-02-01 ENCOUNTER — Other Ambulatory Visit: Payer: Self-pay | Admitting: Neurology

## 2020-02-01 DIAGNOSIS — I420 Dilated cardiomyopathy: Secondary | ICD-10-CM

## 2020-02-01 NOTE — Telephone Encounter (Signed)
Rx has been sent to the pharmacy electronically. ° °

## 2020-02-02 ENCOUNTER — Encounter: Payer: Self-pay | Admitting: Internal Medicine

## 2020-02-02 ENCOUNTER — Telehealth: Payer: Self-pay | Admitting: Internal Medicine

## 2020-02-02 ENCOUNTER — Other Ambulatory Visit: Payer: Self-pay

## 2020-02-02 ENCOUNTER — Ambulatory Visit (INDEPENDENT_AMBULATORY_CARE_PROVIDER_SITE_OTHER): Payer: Medicare Other

## 2020-02-02 ENCOUNTER — Ambulatory Visit: Payer: Medicare Other | Admitting: Internal Medicine

## 2020-02-02 VITALS — BP 130/70 | HR 93 | Temp 95.5°F | Ht 66.0 in | Wt 240.0 lb

## 2020-02-02 DIAGNOSIS — R05 Cough: Secondary | ICD-10-CM | POA: Diagnosis not present

## 2020-02-02 DIAGNOSIS — R059 Cough, unspecified: Secondary | ICD-10-CM

## 2020-02-02 DIAGNOSIS — J90 Pleural effusion, not elsewhere classified: Secondary | ICD-10-CM | POA: Diagnosis not present

## 2020-02-02 DIAGNOSIS — G4733 Obstructive sleep apnea (adult) (pediatric): Secondary | ICD-10-CM | POA: Diagnosis not present

## 2020-02-02 DIAGNOSIS — R6889 Other general symptoms and signs: Secondary | ICD-10-CM | POA: Diagnosis not present

## 2020-02-02 MED ORDER — BENZONATATE 200 MG PO CAPS: ORAL_CAPSULE | ORAL | 1 refills | Status: DC

## 2020-02-02 NOTE — Patient Instructions (Signed)
Script for benzonatate perles sent to [facility] for cough  Order- CXR  Dx cough  Order- schedule PFT     Dx Cough  Order- schedule home sleep test   Dx OSA  Please call us about 2 weeks after your sleep test to see if results and recommendations are ready yet. If appropriate, we may be able to start treatment before we see you next.

## 2020-02-02 NOTE — Telephone Encounter (Signed)
Called and spoke with Patient.  Patient stated Dr Maple Hudson had sent her a prescription of Benzonatate to [facility]. Patient stated it was going to cost $79.  Patient stated she called Margarito Liner, [facility], and was priced much lower. Patient requested having a new prescription sent to [facility]. Benzonatate prescription sent to requested  Karin Golden.   [facility] contacted and cancelled Benzonatate prescription. Nothing further at this time.

## 2020-02-02 NOTE — Assessment & Plan Note (Signed)
LIkely multifactorial- worst at night suggesting postural roles of CHF and GERD. Some bronchoconstriction from timolol possible. No obvious intrinsic lung disease. We don't currently have inhaler samples without a LAMA component. Plan- CXR, PFT (both will be limited by her obesity), benzonatate trial

## 2020-02-02 NOTE — Progress Notes (Signed)
02/02/20- 64 yoF never smoker referred for evaluation of cough and suspected OSA by Sandford Craze, NP Medical problem list includes Migraine, HTN, CHF/ dilated CM, Allergic Rhinitis, GERD, Cervical Myelopathy, Osteoarthritis, Morbid Obesity, Chronic Pain, Borderline Diabetes, Glaucoma, Body weight today patient reported 240 lbs- unable to stand from wheelchair Epworth score 15 She was seen by Dr Vassie Loll for same issues in 2015. OSA suspected but sleep study not done. Cough then attributed to lisinopril. Sleep- Was to have had more recent sleep study per cardiology, prevented by Covid restrictions. Admits loud snoring. She isn't clear about what husband notices. She lies in bed watching TV or facebook, wakes during night to do the same. Melatonin, 1 cup AM coffee.  Cough- Has noticed about a year and a half. Unaware of triggers. Wakes coughing occ at night, productive white. Less cough in day. Sleeps propped up on pillows. Admits some cough occ with swallowing. On medication for GERD. Modest benefit from otc cough syrups. Remote hx asthma, last tried an inhaler 2 yrs ago. Not aware of wheeze. She is not on an ACEI, but takes Timolol eye drops. On Gabapentin, which might help upper airway cough syndrome. Some postnasal drip, apparently mild. Known CHF- not clear if impacts breathing.  Wheelchair dependent and on total disability. Hx C-spine fusion, pending TKR. Had 2 Phizer Covax.  CT coronary morph 01/20/20-  Limited view of the lung parenchyma demonstrates no suspicious nodularity. Airways are normal. Limited view of the mediastinum demonstrates no adenopathy. Esophagus normal. Limited view of the upper abdomen unremarkable. Limited view of the skeleton and chest wall is unremarkable. IMPRESSION: No significant extracardiac findings.  ECHO 12/16/19- EF 30-35%, Global hypokinesis, no PAH, Gr1 DD  Prior to Admission medications   Medication Sig Start Date End Date Taking? Authorizing Provider   Cholecalciferol (VITAMIN D3 PO) Take 1 tablet by mouth daily.    Yes [provider]  gabapentin (NEURONTIN) 300 MG capsule Take 3 capsules (900 mg total) by mouth 3 (three) times daily. 01/12/20  Yes Sandford Craze, NP  latanoprost (XALATAN) 0.005 % ophthalmic solution Place 1 drop into both eyes at bedtime. 10/03/17  Yes [provider]  losartan (COZAAR) 100 MG tablet Take 1 tablet (100 mg total) by mouth daily. 11/09/19  Yes Lewayne Bunting, MD  Melatonin 3 MG TABS Take 1 tablet by mouth at bedtime.   Yes [provider]  metoprolol succinate (TOPROL-XL) 50 MG 24 hr tablet TAKE 1 TABLET BY MOUTH  DAILY WITH OR IMMEDIATELY  FOLLOWING A MEAL 02/01/20  Yes Crenshaw, Madolyn Frieze, MD  metoprolol tartrate (LOPRESSOR) 50 MG tablet TAKE 2 HOURS PRIOR TO CT SCAN 12/23/19  Yes Crenshaw, Madolyn Frieze, MD  [facility] powder APPLY TO AFFECTED AREA(S)  TOPICALLY TWICE DAILY 11/11/19  Yes Sandford Craze, NP  ondansetron (ZOFRAN) 4 MG tablet TAKE 1 TABLET BY MOUTH  EVERY 4 TO 6 HOURS AS  NEEDED FOR NAUSEA 01/12/20  Yes Unk Lightning, PA  pantoprazole (PROTONIX) 40 MG tablet Take 1 tablet (40 mg total) by mouth 2 (two) times daily. 11/04/19  Yes Unk Lightning, PA  potassium chloride SA (KLOR-CON) 20 MEQ tablet Take 1 tablet (20 mEq total) by mouth daily. 12/02/19  Yes Sandford Craze, NP  timolol (BETIMOL) 0.5 % ophthalmic solution Place 1 drop into both eyes every morning.   Yes [provider]  torsemide (DEMADEX) 10 MG tablet TAKE 1 TABLET BY MOUTH  ALTERNATING WITH 2 TABLETS  BY MOUTH EVERY OTHER DAY 01/03/20  Yes Sandford Craze, NP  benzonatate (TESSALON) 200 MG capsule 1 cap 3 times daily as needed for cough 02/02/20   Jetty Duhamel D, MD  nortriptyline (PAMELOR) 25 MG capsule TAKE 2 CAPSULES BY MOUTH AT BEDTIME. 02/02/20   Drema Dallas, DO   Past Medical History:  Diagnosis Date  . Acute systolic congestive heart failure (HCC) 03/01/2014  . Allergy    . Anemia   . Arthritis   . Cardiomyopathy (HCC)   . Cervical myelopathy (HCC)   . CHF (congestive heart failure) (HCC)   . GERD (gastroesophageal reflux disease)   . Heart disease   . Hyperlipidemia   . Hypertension   . Leg pain   . Overactive bladder    Past Surgical History:  Procedure Laterality Date  . ANTERIOR CERVICAL DECOMP/DISCECTOMY FUSION N/A 08/03/2016   Procedure: ANTERIOR CERVICAL DECOMPRESSION/DISCECTOMY FUSION C4-5, C5-6;  Surgeon: Barnett Abu, MD;  Location: [facility] OR;  Service: Neurosurgery;  Laterality: N/A;  . COLONOSCOPY    . COLONOSCOPY WITH PROPOFOL N/A 02/24/2019   Procedure: COLONOSCOPY WITH PROPOFOL;  Surgeon: Rachael Fee, MD;  Location: [facility] ENDOSCOPY;  Service: Endoscopy;  Laterality: N/A;  . KNEE ARTHROSCOPY     2007  . ROTATOR CUFF REPAIR Right 03/23/13  . TOTAL KNEE ARTHROPLASTY  09/22/2011   Procedure: TOTAL KNEE ARTHROPLASTY;  Surgeon: Raymon Mutton, MD;  Location: [facility] OR;  Service: Orthopedics;  Laterality: Right;   Family History  Problem Relation Age of Onset  . Cancer Mother        colon 19 and breast 25- deceased  . Colon cancer Mother   . Diabetes Father        living  . Asthma Father   . Hypertension Father   . Heart attack Father 38       medical management per pt  . Glaucoma Father        had eye transplant  . Kidney failure Father        acute renal failure- died 25  . Esophageal cancer Neg Hx   . Stomach cancer Neg Hx   . Rectal cancer Neg Hx    Social History   Socioeconomic History  . Marital status: Married    Spouse name: Not on file  . Number of children: 2  . Years of education: HS  . Highest education level: Not on file  Occupational History  . 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 Smoking status: Never Smoker  . Smokeless tobacco: Never Used  Substance and Sexual Activity  . Alcohol use: Yes    Alcohol/week: 2.0 standard drinks    Types: 2 Glasses of wine per week  . Drug use: No   . Sexual activity: Not on file  Other Topics Concern  . 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   1-2 cups caffeine daily.   Lives at home with her husband.   Social Determinants of Health   Financial Resource Strain:   . Difficulty of Paying Living Expenses: Not on file  Food Insecurity:   . Worried About Programme researcher, broadcasting/film/video in the Last Year: Not on file  .  Ran Out of Food in the Last Year: Not on file  Transportation Needs:   . Lack of Transportation (Medical): Not on file  . Lack of Transportation (Non-Medical): Not on file  Physical Activity:   . Days of Exercise per Week: Not on file  . Minutes of Exercise per Session: Not on file  Stress:   . Feeling of Stress : Not on file  Social Connections:   . Frequency of Communication with Friends and Family: Not on file  . Frequency of Social Gatherings with Friends and Family: Not on file  . Attends Religious Services: Not on file  . Active Member of Clubs or Organizations: Not on file  . Attends Banker Meetings: Not on file  . Marital Status: Not on file  Intimate Partner Violence:   . Fear of Current or Ex-Partner: Not on file  . Emotionally Abused: Not on file  . Physically Abused: Not on file  . Sexually Abused: Not on file   ROS-see HPI   + = positive Constitutional:    weight loss, night sweats, fevers, chills,+ fatigue, lassitude. HEENT:    headaches, difficulty swallowing, tooth/dental problems, sore throat,       sneezing, itching, ear ache, nasal congestion, post nasal drip, snoring CV:    chest pain, orthopnea, PND, swelling in lower extremities, anasarca,                                  dizziness, palpitations Resp:   shortness of  breath with exertion or at rest.                productive cough,   non-productive cough, coughing up of blood.              change in color of mucus.  wheezing.   Skin:    rash or lesions. GI:  No-   heartburn, indigestion, abdominal pain, nausea, vomiting, diarrhea,                 change in bowel habits, loss of appetite GU: dysuria, change in color of urine, no urgency or frequency.   flank pain. MS:  + joint pain, stiffness, decreased range of motion, back pain. Neuro-     nothing unusual Psych:  change in mood or affect.  depression or anxiety.   memory loss.  OBJ- Physical Exam General- Alert, Oriented, Affect-appropriate, Distress- none acute, +Morbidly Obese, + wheelchair Skin- rash-none, lesions- none, excoriation- none Lymphadenopathy- none Head- atraumatic            Eyes- Gross vision intact, PERRLA, conjunctivae and secretions clear            Ears- Hearing, canals-normal            Nose- Clear, no-Septal dev, mucus, polyps, erosion, perforation             Throat- Mallampati III , mucosa clear , drainage- none, tonsils+ present Neck- flexible , trachea midline, no stridor , thyroid nl, carotid no bruit Chest - symmetrical excursion , unlabored           Heart/CV- RRR , no murmur , no gallop  , no rub, nl s1 s2                           - JVD- none , edema- none, stasis changes- none, varices- none  Lung- clear to P&A, wheeze- none, cough- none , dullness-none, rub- none           Chest wall-  Abd-  Br/ Gen/ Rectal- Not done, not indicated Extrem- cyanosis- none, clubbing, none, atrophy- none, strength- nl Neuro- grossly intact to observation

## 2020-02-02 NOTE — Assessment & Plan Note (Signed)
Presumptive. Wo earlier attempts to schedule sleep study never completed. Plan- educated. Scheduling sleep study, anticipating CPAP may be best option

## 2020-02-02 NOTE — Assessment & Plan Note (Signed)
She reported weight 240 lbs. Unable to stand for weight, but this is probably a significant underestimate. She had previously explored weight loss surgery with Dr Lily Peer.

## 2020-02-04 DIAGNOSIS — Z0279 Encounter for issue of other medical certificate: Secondary | ICD-10-CM

## 2020-02-07 ENCOUNTER — Telehealth: Payer: Self-pay | Admitting: Internal Medicine

## 2020-02-07 NOTE — Telephone Encounter (Signed)
Patient called with results of recent CXR, see result note.

## 2020-02-07 NOTE — Progress Notes (Signed)
Patient called with results of recent chest x ray. Plan to follow up with additional x ray at next visit. Patient verbalized understanding.

## 2020-02-08 ENCOUNTER — Telehealth: Payer: Self-pay | Admitting: Neurology

## 2020-02-08 NOTE — Telephone Encounter (Signed)
Called [facility] to see if patient was scheduled from referral we sent. They stated the were unable to admit for Home Health because of low staffing at this time.

## 2020-02-08 NOTE — Progress Notes (Deleted)
HPI: FU NICM. Echocardiogram in October of 2014 showed an ejection fraction of 40-45%, mild left ventricular hypertrophy and mild left atrial enlargement. Nuclear study in December of 2014 showed an ejection fraction of 51% and normal perfusion.Last echocardiogram July 2021 showed ejection fraction 30 to 35%, moderate left ventricular enlargement, grade 1 diastolic dysfunction. Cardiac CTA August 2021 showed calcium score of 0 and no coronary disease. Since last seen  Current Outpatient Medications  Medication Sig Dispense Refill  . benzonatate (TESSALON) 200 MG capsule 1 cap 3 times daily as needed for cough 50 capsule 1  . Cholecalciferol (VITAMIN D3 PO) Take 1 tablet by mouth daily.     Marland Kitchen gabapentin (NEURONTIN) 300 MG capsule Take 3 capsules (900 mg total) by mouth 3 (three) times daily. 810 capsule 1  . latanoprost (XALATAN) 0.005 % ophthalmic solution Place 1 drop into both eyes at bedtime.  11  . losartan (COZAAR) 100 MG tablet Take 1 tablet (100 mg total) by mouth daily. 90 tablet 3  . Melatonin 3 MG TABS Take 1 tablet by mouth at bedtime.    . metoprolol succinate (TOPROL-XL) 50 MG 24 hr tablet TAKE 1 TABLET BY MOUTH  DAILY WITH OR IMMEDIATELY  FOLLOWING A MEAL 90 tablet 1  . metoprolol tartrate (LOPRESSOR) 50 MG tablet TAKE 2 HOURS PRIOR TO CT SCAN 1 tablet 0  . nortriptyline (PAMELOR) 25 MG capsule TAKE 2 CAPSULES BY MOUTH AT BEDTIME. 60 capsule 5  . NYAMYC powder APPLY TO AFFECTED AREA(S)  TOPICALLY TWICE DAILY 180 g 1  . ondansetron (ZOFRAN) 4 MG tablet TAKE 1 TABLET BY MOUTH  EVERY 4 TO 6 HOURS AS  NEEDED FOR NAUSEA 90 tablet 0  . pantoprazole (PROTONIX) 40 MG tablet Take 1 tablet (40 mg total) by mouth 2 (two) times daily. 180 tablet 1  . potassium chloride SA (KLOR-CON) 20 MEQ tablet Take 1 tablet (20 mEq total) by mouth daily. 90 tablet 1  . timolol (BETIMOL) 0.5 % ophthalmic solution Place 1 drop into both eyes every morning.    . torsemide (DEMADEX) 10 MG tablet TAKE 1  TABLET BY MOUTH  ALTERNATING WITH 2 TABLETS  BY MOUTH EVERY OTHER DAY 135 tablet 3   No current facility-administered medications for this visit.     Past Medical History:  Diagnosis Date  . Acute systolic congestive heart failure (HCC) 03/01/2014  . Allergy   . Anemia   . Arthritis   . Cardiomyopathy (HCC)   . Cervical myelopathy (HCC)   . CHF (congestive heart failure) (HCC)   . GERD (gastroesophageal reflux disease)   . Heart disease   . Hyperlipidemia   . Hypertension   . Leg pain   . Overactive bladder     Past Surgical History:  Procedure Laterality Date  . ANTERIOR CERVICAL DECOMP/DISCECTOMY FUSION N/A 08/03/2016   Procedure: ANTERIOR CERVICAL DECOMPRESSION/DISCECTOMY FUSION C4-5, C5-6;  Surgeon: Barnett Abu, MD;  Location: [facility] OR;  Service: Neurosurgery;  Laterality: N/A;  . COLONOSCOPY    . COLONOSCOPY WITH PROPOFOL N/A 02/24/2019   Procedure: COLONOSCOPY WITH PROPOFOL;  Surgeon: Rachael Fee, MD;  Location: [facility];  Service: Endoscopy;  Laterality: N/A;  . KNEE ARTHROSCOPY     2007  . ROTATOR CUFF REPAIR Right 03/23/13  . TOTAL KNEE ARTHROPLASTY  09/22/2011   Procedure: TOTAL KNEE ARTHROPLASTY;  Surgeon: Raymon Mutton, MD;  Location: [facility] OR;  Service: Orthopedics;  Laterality: Right;    Social History  Socioeconomic History  . Marital status: Married    Spouse name: Not on file  . Number of children: 2  . Years of education: HS  . Highest education level: Not on file  Occupational History  . 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 Smoking status: Never Smoker  . Smokeless tobacco: Never Used  Substance and Sexual Activity  . Alcohol use: Yes    Alcohol/week: 2.0 standard drinks    Types: 2 Glasses of wine per week  . Drug use: No  . Sexual activity: Not on file  Other Topics Concern  . Not on file  Social History Narrative   Married- 32 years   2 children- grown daughter- lives in [place]- has 2 children   Grown son  lives in [place]- 4 children   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   1-2 cups caffeine daily.   Lives at home with her husband.   Social Determinants of Health   Financial Resource Strain:   . Difficulty of Paying Living Expenses: Not on file  Food Insecurity:   . Worried About Programme researcher, broadcasting/film/video in the Last Year: Not on file  . Ran Out of Food in the Last Year: Not on file  Transportation Needs:   . Lack of Transportation (Medical): Not on file  . Lack of Transportation (Non-Medical): Not on file  Physical Activity:   . Days of Exercise per Week: Not on file  . Minutes of Exercise per Session: Not on file  Stress:   . Feeling of Stress : Not on file  Social Connections:   . Frequency of Communication with Friends and Family: Not on file  . Frequency of Social Gatherings with Friends and Family: Not on file  . Attends Religious Services: Not on file  . Active Member of Clubs or Organizations: Not on file  . Attends Banker Meetings: Not on file  . Marital Status: Not on file  Intimate Partner Violence:   . Fear of Current or Ex-Partner: Not on file  . Emotionally Abused: Not on file  . Physically Abused: Not on file  . Sexually Abused: Not on file    Family History  Problem Relation Age of Onset  . Cancer Mother        colon 8 and breast 25- deceased  . Colon cancer Mother   . Diabetes Father        living  . Asthma Father   . Hypertension Father   . Heart attack Father 78       medical management per pt  . Glaucoma Father        had eye transplant  . Kidney failure Father        acute renal failure- died 16  . Esophageal cancer Neg Hx   . Stomach cancer Neg Hx   . Rectal cancer Neg Hx     ROS: no fevers or chills, productive cough, hemoptysis, dysphasia, odynophagia, melena, hematochezia, dysuria, hematuria, rash, seizure activity,  orthopnea, PND, pedal edema, claudication. Remaining systems are negative.  Physical Exam: Well-developed well-nourished in no acute distress.  Skin is warm and dry.  HEENT is normal.  Neck is supple.  Chest is clear to auscultation with normal expansion.  Cardiovascular exam is regular rate and rhythm.  Abdominal exam nontender or distended. No masses  palpated. Extremities show no edema. neuro grossly intact  ECG- personally reviewed  A/P  1 nonischemic cardiomyopathy-etiology unclear. Cardiac CTA shows no coronary disease. No history of alcohol abuse. We will arrange cardiac MRI to rule out infiltrative process. Discontinue losartan. Begin Entresto 24/26 twice daily. Continue beta-blocker. Titrate medications as tolerated by pulse and blood pressure. We will then repeat echocardiogram. If ejection fraction less than 35% would need to consider CRT-D.  2 chronic combined systolic/diastolic congestive heart failure-she appears to be euvolemic. Continue present dose of diuretic. Check potassium and renal function.  3 hypertension-blood pressure controlled. Medication adjustments for cardiomyopathy as outlined above.  4 morbid obesity-further therapy and evaluation to rule out sleep apnea per pulmonary.  Olga Millers, MD

## 2020-02-14 ENCOUNTER — Encounter: Payer: Self-pay | Admitting: Cardiology

## 2020-02-14 ENCOUNTER — Telehealth (INDEPENDENT_AMBULATORY_CARE_PROVIDER_SITE_OTHER): Payer: Medicare Other | Admitting: Cardiology

## 2020-02-14 VITALS — BP 126/80 | HR 83

## 2020-02-14 DIAGNOSIS — R0600 Dyspnea, unspecified: Secondary | ICD-10-CM

## 2020-02-14 DIAGNOSIS — I428 Other cardiomyopathies: Secondary | ICD-10-CM

## 2020-02-14 DIAGNOSIS — I447 Left bundle-branch block, unspecified: Secondary | ICD-10-CM

## 2020-02-14 DIAGNOSIS — R0609 Other forms of dyspnea: Secondary | ICD-10-CM

## 2020-02-14 MED ORDER — ENTRESTO 49-51 MG PO TABS: 1.0000 | ORAL_TABLET | Freq: Two times a day (BID) | ORAL | 3 refills | Status: DC

## 2020-02-14 NOTE — Progress Notes (Signed)
Virtual Visit via Telephone Note   This visit type was conducted due to national recommendations for restrictions regarding the COVID-19 Pandemic (e.g. social distancing) in an effort to limit this patient's exposure and mitigate transmission in our community.  Due to her co-morbid illnesses, this patient is at least at moderate risk for complications without adequate follow up.  This format is felt to be most appropriate for this patient at this time.  The patient did not have access to video technology/had technical difficulties with video requiring transitioning to audio format only (telephone).  All issues noted in this document were discussed and addressed.  No physical exam could be performed with this format.  Please refer to the patient's chart for her  consent to telehealth for [facility].    Date:  02/14/2020   ID:  Briana Miller, DOB [birth year], MRN [id number] The patient was identified using 2 identifiers.  Patient Location: Home Provider Location: Home Office  PCP:  Sandford Craze, NP  Cardiologist:  Olga Millers, MD  Electrophysiologist:  None   Evaluation Performed:  Follow-Up Visit  Chief Complaint:  DOE  History of Present Illness:    Briana Miller is a 64 y.o. female with a history of hypertension, morbid obesity, sleep apnea, DJD, and nonischemic cardiomyopathy. Echocardiogram in 2014 showed an ejection fraction of 40-45%. She had a negative nuclear stress in 2015. She has had some issues with dyspnea on exertion. She has been sedentary secondary to osteoarthritis of her knees. She was seen for pre op TKR in May 2021.  A Myoview was ordered but never done. Echo done July 2021 showed an EF of 30-35%.  Coronary CTA 8/06 was negative for CAD. The patient complains of DOE, no orthopnea.  Her weight is up but the appears to be variable.  The patient does not have symptoms concerning for COVID-19 infection (fever, chills, cough, or new shortness of  breath).    Past Medical History:  Diagnosis Date  . Acute systolic congestive heart failure (HCC) 03/01/2014  . Allergy   . Anemia   . Arthritis   . Cardiomyopathy (HCC)   . Cervical myelopathy (HCC)   . CHF (congestive heart failure) (HCC)   . GERD (gastroesophageal reflux disease)   . Heart disease   . Hyperlipidemia   . Hypertension   . Leg pain   . Overactive bladder    Past Surgical History:  Procedure Laterality Date  . ANTERIOR CERVICAL DECOMP/DISCECTOMY FUSION N/A 08/03/2016   Procedure: ANTERIOR CERVICAL DECOMPRESSION/DISCECTOMY FUSION C4-5, C5-6;  Surgeon: Barnett Abu, MD;  Location: [facility] OR;  Service: Neurosurgery;  Laterality: N/A;  . COLONOSCOPY    . COLONOSCOPY WITH PROPOFOL N/A 02/24/2019   Procedure: COLONOSCOPY WITH PROPOFOL;  Surgeon: Rachael Fee, MD;  Location: [facility];  Service: Endoscopy;  Laterality: N/A;  . KNEE ARTHROSCOPY     2007  . ROTATOR CUFF REPAIR Right 03/23/13  . TOTAL KNEE ARTHROPLASTY  09/22/2011   Procedure: TOTAL KNEE ARTHROPLASTY;  Surgeon: Raymon Mutton, MD;  Location: [facility] OR;  Service: Orthopedics;  Laterality: Right;     Current Meds  Medication Sig  . benzonatate (TESSALON) 200 MG capsule 1 cap 3 times daily as needed for cough  . Cholecalciferol (VITAMIN D3 Miller) Take 1 tablet by mouth daily.   Marland Kitchen gabapentin (NEURONTIN) 300 MG capsule Take 3 capsules (900 mg total) by mouth 3 (three) times daily.  Marland Kitchen latanoprost (XALATAN) 0.005 % ophthalmic solution Place 1 drop into both  eyes at bedtime.  Marland Kitchen losartan (COZAAR) 100 MG tablet Take 1 tablet (100 mg total) by mouth daily.  . Melatonin 3 MG TABS Take 1 tablet by mouth at bedtime.  . metoprolol succinate (TOPROL-XL) 50 MG 24 hr tablet TAKE 1 TABLET BY MOUTH  DAILY WITH OR IMMEDIATELY  FOLLOWING A MEAL  . nortriptyline (PAMELOR) 25 MG capsule TAKE 2 CAPSULES BY MOUTH AT BEDTIME.  Marland Kitchen NYAMYC powder APPLY TO AFFECTED AREA(S)  TOPICALLY TWICE DAILY  . ondansetron (ZOFRAN) 4 MG tablet TAKE 1  TABLET BY MOUTH  EVERY 4 TO 6 HOURS AS  NEEDED FOR NAUSEA  . pantoprazole (PROTONIX) 40 MG tablet Take 1 tablet (40 mg total) by mouth 2 (two) times daily.  . potassium chloride SA (KLOR-CON) 20 MEQ tablet Take 1 tablet (20 mEq total) by mouth daily.  . timolol (BETIMOL) 0.5 % ophthalmic solution Place 1 drop into both eyes every morning.  . torsemide (DEMADEX) 10 MG tablet TAKE 1 TABLET BY MOUTH  ALTERNATING WITH 2 TABLETS  BY MOUTH EVERY OTHER DAY     Allergies:   Patient has no known allergies.   Social History   Tobacco Use  . Smoking status: Never Smoker  . Smokeless tobacco: Never Used  Substance Use Topics  . Alcohol use: Yes    Alcohol/week: 2.0 standard drinks    Types: 2 Glasses of wine per week  . Drug use: No     Family Hx: The patient's family history includes Asthma in her father; Cancer in her mother; Colon cancer in her mother; Diabetes in her father; Glaucoma in her father; Heart attack (age of onset: 13) in her father; Hypertension in her father; Kidney failure in her father. There is no history of Esophageal cancer, Stomach cancer, or Rectal cancer.  ROS:   Please see the history of present illness.    All other systems reviewed and are negative.   Prior CV studies:   The following studies were reviewed today:  Echo July 2021- IMPRESSIONS    1. Left ventricular ejection fraction, by estimation, is 30 to 35%. The  left ventricle has moderately decreased function. The left ventricle  demonstrates global hypokinesis. The left ventricular internal cavity size  was moderately dilated. Left  ventricular diastolic parameters are consistent with Grade I diastolic  dysfunction (impaired relaxation).  2. Right ventricular systolic function is normal. The right ventricular  size is normal. There is normal pulmonary artery systolic pressure.  3. The mitral valve is normal in structure. Trivial mitral valve  regurgitation. No evidence of mitral stenosis.  4.  The aortic valve is tricuspid. Aortic valve regurgitation is not  visualized. No aortic stenosis is present.  5. The inferior vena cava is normal in size with greater than 50%  respiratory variability, suggesting right atrial pressure of 3 mmHg.   Labs/Other Tests and Data Reviewed:    EKG:  An ECG dated 11/04/2019 was personally reviewed today and demonstrated:  NSR, HR 68, LBBB  Recent Labs: 04/08/2019: ALT 13; Hemoglobin 11.9; Platelets 269.0 01/13/2020: BUN 27; Creatinine, Ser 1.17; Potassium 4.6; Sodium 140   Recent Lipid Panel Lab Results  Component Value Date/Time   CHOL 170 12/02/2019 02:34 PM   TRIG 140 12/02/2019 02:34 PM   HDL 49 (L) 12/02/2019 02:34 PM   CHOLHDL 3.5 12/02/2019 02:34 PM   LDLCALC 97 12/02/2019 02:34 PM    Wt Readings from Last 3 Encounters:  02/02/20 240 lb (108.9 kg)  01/09/20 (!) 220 lb (99.8  kg)  12/16/19 240 lb (108.9 kg)     Objective:    Vital Signs:  BP 126/80   Pulse 83   LMP 06/17/2007    VITAL SIGNS:  reviewed  ASSESSMENT & PLAN:    NICM- EF now 30-35% with symptoms of DOE- Coronary CTA 01/20/2020- negative for CAD   DOE- Multifactorial. I suggested she try an extra Torsemide for 2-3 days to see if that helps.   HTN- controlled- will switch Losartan to Entresto.  Consider changing Toprol to Coreg at f/u.  Morbid obesity- BMI 38.7-suspected sleep apnea. Dr Maple Hudson following.  LBBB   Plan: Change Losartan to Entresto 49/51 BID.  F/U in two weeks with labs.  Consider f/u echo in 2-3 months.  If her EF remains depressed she may be a candidate for a BiV device.   COVID-19 Education: The signs and symptoms of COVID-19 were discussed with the patient and how to seek care for testing (follow up with PCP or arrange E-visit).  The importance of social distancing was discussed today.  Time:   Today, I have spent 20 minutes with the patient with telehealth technology discussing the above problems.     Medication Adjustments/Labs and  Tests Ordered: Current medicines are reviewed at length with the patient today.  Concerns regarding medicines are outlined above.   Tests Ordered: No orders of the defined types were placed in this encounter.   Medication Changes: No orders of the defined types were placed in this encounter.   Follow Up:  In Person with Dr Jens Som or an APP intwo weeks  Signed, Corine Shelter, Cordelia Poche  02/14/2020 11:30 AM    [facility]

## 2020-02-14 NOTE — Patient Instructions (Signed)
Medication Instructions:   STOP LOSARTAN  START ENTRESTO 49/51 MG ONE TABLET TWICE DAILY  *If you need a refill on your cardiac medications before your next appointment, please call your pharmacy*   Lab Work: If you have labs (blood work) drawn today and your tests are completely normal, you will receive your results only by: Marland Kitchen MyChart Message (if you have MyChart) OR . A paper copy in the mail If you have any lab test that is abnormal or we need to change your treatment, we will call you to review the results.   Follow-Up: At [facility], you and your health needs are our priority.  As part of our continuing mission to provide you with exceptional heart care, we have created designated Provider Care Teams.  These Care Teams include your primary Cardiologist (physician) and Advanced Practice Providers (APPs -  Physician Assistants and Nurse Practitioners) who all work together to provide you with the care you need, when you need it.  We recommend signing up for the patient portal called "MyChart".  Sign up information is provided on this After Visit Summary.  MyChart is used to connect with patients for Virtual Visits (Telemedicine).  Patients are able to view lab/test results, encounter notes, upcoming appointments, etc.  Non-urgent messages can be sent to your provider as well.   To learn more about what you can do with MyChart, go to [email or web address].    Your next appointment:   3 week(s)  The format for your next appointment:   In Person  Provider:    Corine Shelter, PA-C

## 2020-02-15 ENCOUNTER — Ambulatory Visit: Payer: Medicare Other | Admitting: Cardiology

## 2020-02-19 DIAGNOSIS — G8220 Paraplegia, unspecified: Secondary | ICD-10-CM | POA: Diagnosis not present

## 2020-02-19 DIAGNOSIS — G959 Disease of spinal cord, unspecified: Secondary | ICD-10-CM | POA: Diagnosis not present

## 2020-02-27 ENCOUNTER — Telehealth: Payer: Self-pay | Admitting: Neurology

## 2020-02-27 NOTE — Telephone Encounter (Signed)
Patient states she would like a referral for in home PT. She also would like to know if Dr Everlena Cooper does cortisone shots. Please call.

## 2020-02-27 NOTE — Telephone Encounter (Signed)
Pt advised Home Pt was ordered 8/11 checking to see what happened.   Shots Dr. Everlena Cooper do not do.

## 2020-02-28 ENCOUNTER — Other Ambulatory Visit: Payer: Self-pay

## 2020-02-28 DIAGNOSIS — M48062 Spinal stenosis, lumbar region with neurogenic claudication: Secondary | ICD-10-CM

## 2020-02-28 NOTE — Progress Notes (Signed)
[facility] unable to take pt right now. New order added for [facility] For home health PT

## 2020-03-02 ENCOUNTER — Telehealth: Payer: Self-pay | Admitting: Neurology

## 2020-03-02 NOTE — Telephone Encounter (Signed)
[facility] called and said they were not successful in reaching the patient for home health care today. They will try again on Monday, 03/05/20.

## 2020-03-05 DIAGNOSIS — D649 Anemia, unspecified: Secondary | ICD-10-CM | POA: Diagnosis not present

## 2020-03-05 DIAGNOSIS — I110 Hypertensive heart disease with heart failure: Secondary | ICD-10-CM | POA: Diagnosis not present

## 2020-03-05 DIAGNOSIS — G894 Chronic pain syndrome: Secondary | ICD-10-CM | POA: Diagnosis not present

## 2020-03-05 DIAGNOSIS — M5000 Cervical disc disorder with myelopathy, unspecified cervical region: Secondary | ICD-10-CM | POA: Diagnosis not present

## 2020-03-05 DIAGNOSIS — K219 Gastro-esophageal reflux disease without esophagitis: Secondary | ICD-10-CM | POA: Diagnosis not present

## 2020-03-05 DIAGNOSIS — M48062 Spinal stenosis, lumbar region with neurogenic claudication: Secondary | ICD-10-CM | POA: Diagnosis not present

## 2020-03-05 DIAGNOSIS — M545 Low back pain: Secondary | ICD-10-CM | POA: Diagnosis not present

## 2020-03-05 DIAGNOSIS — M1990 Unspecified osteoarthritis, unspecified site: Secondary | ICD-10-CM | POA: Diagnosis not present

## 2020-03-05 DIAGNOSIS — Z993 Dependence on wheelchair: Secondary | ICD-10-CM | POA: Diagnosis not present

## 2020-03-05 DIAGNOSIS — R51 Headache with orthostatic component, not elsewhere classified: Secondary | ICD-10-CM | POA: Diagnosis not present

## 2020-03-05 DIAGNOSIS — I5020 Unspecified systolic (congestive) heart failure: Secondary | ICD-10-CM | POA: Diagnosis not present

## 2020-03-05 DIAGNOSIS — E785 Hyperlipidemia, unspecified: Secondary | ICD-10-CM | POA: Diagnosis not present

## 2020-03-05 DIAGNOSIS — I429 Cardiomyopathy, unspecified: Secondary | ICD-10-CM | POA: Diagnosis not present

## 2020-03-05 DIAGNOSIS — M79602 Pain in left arm: Secondary | ICD-10-CM | POA: Diagnosis not present

## 2020-03-05 NOTE — Telephone Encounter (Signed)
LMOVM, Chip Boer trying to reach you in regards to your Referral.

## 2020-03-08 DIAGNOSIS — I5020 Unspecified systolic (congestive) heart failure: Secondary | ICD-10-CM | POA: Diagnosis not present

## 2020-03-08 DIAGNOSIS — M48062 Spinal stenosis, lumbar region with neurogenic claudication: Secondary | ICD-10-CM | POA: Diagnosis not present

## 2020-03-08 DIAGNOSIS — I429 Cardiomyopathy, unspecified: Secondary | ICD-10-CM | POA: Diagnosis not present

## 2020-03-08 DIAGNOSIS — M545 Low back pain: Secondary | ICD-10-CM | POA: Diagnosis not present

## 2020-03-08 DIAGNOSIS — M5000 Cervical disc disorder with myelopathy, unspecified cervical region: Secondary | ICD-10-CM | POA: Diagnosis not present

## 2020-03-08 DIAGNOSIS — D649 Anemia, unspecified: Secondary | ICD-10-CM | POA: Diagnosis not present

## 2020-03-08 DIAGNOSIS — M79602 Pain in left arm: Secondary | ICD-10-CM | POA: Diagnosis not present

## 2020-03-08 DIAGNOSIS — M1990 Unspecified osteoarthritis, unspecified site: Secondary | ICD-10-CM | POA: Diagnosis not present

## 2020-03-08 DIAGNOSIS — K219 Gastro-esophageal reflux disease without esophagitis: Secondary | ICD-10-CM | POA: Diagnosis not present

## 2020-03-08 DIAGNOSIS — G894 Chronic pain syndrome: Secondary | ICD-10-CM | POA: Diagnosis not present

## 2020-03-08 DIAGNOSIS — Z993 Dependence on wheelchair: Secondary | ICD-10-CM | POA: Diagnosis not present

## 2020-03-08 DIAGNOSIS — I110 Hypertensive heart disease with heart failure: Secondary | ICD-10-CM | POA: Diagnosis not present

## 2020-03-08 DIAGNOSIS — E785 Hyperlipidemia, unspecified: Secondary | ICD-10-CM | POA: Diagnosis not present

## 2020-03-08 DIAGNOSIS — R51 Headache with orthostatic component, not elsewhere classified: Secondary | ICD-10-CM | POA: Diagnosis not present

## 2020-03-09 ENCOUNTER — Ambulatory Visit: Payer: Medicare Other | Admitting: Cardiology

## 2020-03-09 ENCOUNTER — Institutional Professional Consult (permissible substitution): Payer: Medicare Other | Admitting: Pulmonary Disease

## 2020-03-09 ENCOUNTER — Ambulatory Visit: Payer: Medicare Other | Admitting: Family

## 2020-03-09 ENCOUNTER — Encounter: Payer: Self-pay | Admitting: Cardiology

## 2020-03-09 ENCOUNTER — Other Ambulatory Visit: Payer: Self-pay

## 2020-03-09 VITALS — BP 118/68 | HR 95 | Temp 97.3°F | Resp 16 | Ht 66.0 in

## 2020-03-09 DIAGNOSIS — I447 Left bundle-branch block, unspecified: Secondary | ICD-10-CM

## 2020-03-09 DIAGNOSIS — I428 Other cardiomyopathies: Secondary | ICD-10-CM

## 2020-03-09 DIAGNOSIS — G4733 Obstructive sleep apnea (adult) (pediatric): Secondary | ICD-10-CM

## 2020-03-09 DIAGNOSIS — I5042 Chronic combined systolic (congestive) and diastolic (congestive) heart failure: Secondary | ICD-10-CM

## 2020-03-09 DIAGNOSIS — I10 Essential (primary) hypertension: Secondary | ICD-10-CM

## 2020-03-09 DIAGNOSIS — I509 Heart failure, unspecified: Secondary | ICD-10-CM | POA: Diagnosis not present

## 2020-03-09 MED ORDER — CARVEDILOL 12.5 MG PO TABS
12.5000 mg | ORAL_TABLET | Freq: Two times a day (BID) | ORAL | 3 refills | Status: DC
Start: 2020-03-09 — End: 2020-03-09

## 2020-03-09 MED ORDER — CARVEDILOL 12.5 MG PO TABS: 12.5000 mg | ORAL_TABLET | Freq: Two times a day (BID) | ORAL | 1 refills | Status: DC

## 2020-03-09 MED ORDER — LOSARTAN POTASSIUM 50 MG PO TABS: 50.0000 mg | ORAL_TABLET | Freq: Every day | ORAL | 1 refills | Status: DC

## 2020-03-09 MED ORDER — LOSARTAN POTASSIUM 50 MG PO TABS
50.0000 mg | ORAL_TABLET | Freq: Every day | ORAL | 3 refills | Status: DC
Start: 2020-03-09 — End: 2020-03-09

## 2020-03-09 NOTE — Assessment & Plan Note (Signed)
She has seen dr Maple Hudson and sleep study has again been recomended

## 2020-03-09 NOTE — Assessment & Plan Note (Signed)
B/P 118/68 today- will start her on Coreg and Losartan 50 mg. F/U virtual visit in 2 weeks.

## 2020-03-09 NOTE — Patient Instructions (Signed)
Medication Instructions:   STOP Entresto  START Losartan 50 mg daily.  START Carvedilol 12.5 mg twice daily.  *If you need a refill on your cardiac medications before your next appointment, please call your pharmacy*    Testing/Procedures: Your physician has requested that you have an echocardiogram in 6 weeks (before follow-up with Dr. Jens Som). Echocardiography is a painless test that uses sound waves to create images of your heart. It provides your doctor with information about the size and shape of your heart and how well your heart's chambers and valves are working. This procedure takes approximately one hour. There are no restrictions for this procedure.    Follow-Up: At [facility], you and your health needs are our priority.  As part of our continuing mission to provide you with exceptional heart care, we have created designated Provider Care Teams.  These Care Teams include your primary Cardiologist (physician) and Advanced Practice Providers (APPs -  Physician Assistants and Nurse Practitioners) who all work together to provide you with the care you need, when you need it.  We recommend signing up for the patient portal called "MyChart".  Sign up information is provided on this After Visit Summary.  MyChart is used to connect with patients for Virtual Visits (Telemedicine).  Patients are able to view lab/test results, encounter notes, upcoming appointments, etc.  Non-urgent messages can be sent to your provider as well.   To learn more about what you can do with MyChart, go to [email or web address].    Your next appointment:  Tuesday, 04/03/20 at 1:45 PM VIRTUAL with Corine Shelter, pA   Friday, 04/27/20 at 2:00 PM IN OFFICE with Dr. Jens Som

## 2020-03-09 NOTE — Assessment & Plan Note (Signed)
I'm not sure she would be a candidate for a BiV device but will check a f/u echo on medical Rx in 6 weeks.  F/U with Dr Jens Som after this.

## 2020-03-09 NOTE — Assessment & Plan Note (Signed)
Lungs clear, no orthopnea

## 2020-03-09 NOTE — Assessment & Plan Note (Addendum)
Most recent EF 30-35%- echo July 2021 Coronary CTA negative for CAD Aug 2021 She was unable to tolerate Entresto- "dry mouth", will put her back on Losartan at 50 mg daily

## 2020-03-09 NOTE — Addendum Note (Signed)
Addended by: Evans Lance on: 03/09/2020 03:46 PM   Modules accepted: Orders

## 2020-03-09 NOTE — Assessment & Plan Note (Signed)
Last BMI 38 but the patient declines weight secondary to her knee DJD- her weight may be higher than 240 lbs

## 2020-03-09 NOTE — Progress Notes (Signed)
Cardiology Office Note:    Date:  03/09/2020   ID:  [name], DOB [birth year], MRN [id number]  PCP:  Sandford Craze, NP  Cardiologist:  Olga Millers, MD  Electrophysiologist:  None   Referring MD: Sandford Craze, NP   CC:  Dry mouth  History of Present Illness:    Briana Miller is a pleasant obese 64 y.o.AA female with a hx of hypertension, morbid obesity, sleep apnea, DJD, and nonischemic cardiomyopathy. Echocardiogram in 2014 showed an ejection fraction of 40-45%. She had a negative nuclear stress in 2015. She has had some issues with dyspnea on exertion. She has been sedentary secondary to osteoarthritis of her knees. She was seen for pre op TKR in May 2021.  A Myoview was ordered but never done. She tells me she never did have her knee surgery, she is getting home PT.  Echo done July 2021 showed an EF of 30-35%.  Coronary CTA 01/20/2020 was negative for CAD and we feel she has a NICM.   I spoke with her in a virtual visit 02/14/2020.  I suggested she stop Losartan 100 mg daily and start Entresto  49/51 BID.  She was to have a f/u BMP but this was never done.  She is in the office today for follow up.  She is in a wheelchair and could not stand for her weight.  She tells me the Sherryll Burger is making her mouth "dry" and she has been drinking a lot of water.  She also thought she was supposed to stop her Metoprolol.  She denies any unusual SOB at rest, she has chronic dyspnea with any exertion.   Past Medical History:  Diagnosis Date  . Acute systolic congestive heart failure (HCC) 03/01/2014  . Allergy   . Anemia   . Arthritis   . Cardiomyopathy (HCC)   . Cervical myelopathy (HCC)   . CHF (congestive heart failure) (HCC)   . GERD (gastroesophageal reflux disease)   . Heart disease   . Hyperlipidemia   . Hypertension   . Leg pain   . Overactive bladder     Past Surgical History:  Procedure Laterality Date  . ANTERIOR CERVICAL DECOMP/DISCECTOMY FUSION  N/A 08/03/2016   Procedure: ANTERIOR CERVICAL DECOMPRESSION/DISCECTOMY FUSION C4-5, C5-6;  Surgeon: Barnett Abu, MD;  Location: [facility] OR;  Service: Neurosurgery;  Laterality: N/A;  . COLONOSCOPY    . COLONOSCOPY WITH PROPOFOL N/A 02/24/2019   Procedure: COLONOSCOPY WITH PROPOFOL;  Surgeon: Rachael Fee, MD;  Location: [facility] END[facility]COPY;  Service: Endoscopy;  Laterality: N/A;  . KNEE ARTHROSCOPY     2007  . ROTATOR CUFF REPAIR Right 03/23/13  . TOTAL KNEE ARTHROPLASTY  09/22/2011   Procedure: TOTAL KNEE ARTHROPLASTY;  Surgeon: Raymon Mutton, MD;  Location: [facility] OR;  Service: Orthopedics;  Laterality: Right;    Current Medications: Current Meds  Medication Sig  . benzonatate (TESSALON) 200 MG capsule 1 cap 3 times daily as needed for cough  . Cholecalciferol (VITAMIN D3 PO) Take 1 tablet by mouth daily.   Marland Kitchen latanoprost (XALATAN) 0.005 % ophthalmic solution Place 1 drop into both eyes at bedtime.   . Melatonin 3 MG TABS Take 1 tablet by mouth as needed.   . nortriptyline (PAMELOR) 25 MG capsule TAKE 2 CAPSULES BY MOUTH AT BEDTIME.  Marland Kitchen NYAMYC powder APPLY TO AFFECTED AREA(S)  TOPICALLY TWICE DAILY  . ondansetron (ZOFRAN) 4 MG tablet TAKE 1 TABLET BY MOUTH  EVERY 4 TO 6 HOURS AS  NEEDED FOR  NAUSEA  . pantoprazole (PROTONIX) 40 MG tablet Take 1 tablet (40 mg total) by mouth 2 (two) times daily.  . potassium chloride SA (KLOR-CON) 20 MEQ tablet Take 1 tablet (20 mEq total) by mouth daily.  . timolol (BETIMOL) 0.5 % ophthalmic solution Place 1 drop into both eyes every morning.   . torsemide (DEMADEX) 10 MG tablet TAKE 1 TABLET BY MOUTH  ALTERNATING WITH 2 TABLETS  BY MOUTH EVERY OTHER DAY  . [DISCONTINUED] sacubitril-valsartan (ENTRESTO) 49-51 MG Take 1 tablet by mouth 2 (two) times daily.     Allergies:   Patient has no known allergies.   Social History   Socioeconomic History  . Marital status: Married    Spouse name: Not on file  . Number of children: 2  . Years of education: HS  . Highest  education level: Not on file  Occupational History  . 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 Smoking status: Never Smoker  . Smokeless tobacco: Never Used  Substance and Sexual Activity  . Alcohol use: Yes    Alcohol/week: 2.0 standard drinks    Types: 2 Glasses of wine per week  . Drug use: No  . Sexual activity: Not on file  Other Topics Concern  . 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   1-2 cups caffeine daily.   Lives at home with her husband.   Social Determinants of Health   Financial Resource Strain:   . Difficulty of Paying Living Expenses: Not on file  Food Insecurity:   . Worried About Programme researcher, broadcasting/film/video in the Last Year: Not on file  . Ran Out of Food in the Last Year: Not on file  Transportation Needs:   . Lack of Transportation (Medical): Not on file  . Lack of Transportation (Non-Medical): Not on file  Physical Activity:   . Days of Exercise per Week: Not on file  . Minutes of Exercise per Session: Not on file  Stress:   . Feeling of Stress : Not on file  Social Connections:   . Frequency of Communication with Friends and Family: Not on file  . Frequency of Social Gatherings with Friends and Family: Not on file  . Attends Religious Services: Not on file  . Active Member of Clubs or Organizations: Not on file  . Attends Banker Meetings: Not on file  . Marital Status: Not on file     Family History: The patient's family history includes Asthma in her father; Cancer in her mother; Colon cancer in her mother; Diabetes in her father; Glaucoma in her father; Heart attack (age of onset: 42) in her father; Hypertension in her father; Kidney failure in her  father. There is no history of Esophageal cancer, Stomach cancer, or Rectal cancer.  ROS:   Please see the history of present illness.     All other systems reviewed and are negative.  EKGs/Labs/Other Studies Reviewed:    The following studies were reviewed today: Coronary CT- 01/20/2020 Echo 12/16/2019  EKG:  EKG is not ordered today.  The ekg ordered 11/04/2019 demonstrates NSR, LBBB  Recent Labs: 04/08/2019: ALT 13; Hemoglobin  11.9; Platelets 269.0 01/13/2020: BUN 27; Creatinine, Ser 1.17; Potassium 4.6; Sodium 140  Recent Lipid Panel    Component Value Date/Time   CHOL 170 12/02/2019 1434   TRIG 140 12/02/2019 1434   HDL 49 (L) 12/02/2019 1434   CHOLHDL 3.5 12/02/2019 1434   VLDL 23.8 01/21/2019 1350   LDLCALC 97 12/02/2019 1434    Physical Exam:    VS:  BP 118/68   Pulse 95   Temp (!) 97.3 F (36.3 C)   Resp 16   Ht 5\' 6"  (1.676 m)   LMP 06/17/2007   SpO2 97%   BMI 38.74 kg/m     Wt Readings from Last 3 Encounters:  02/02/20 240 lb (108.9 kg)  01/09/20 (!) 220 lb (99.8 kg)  12/16/19 240 lb (108.9 kg)     GEN: Morbidly obese AA female, in a wheelchair,  in no acute distress HEENT: Normal NECK: No JVD; No carotid bruits CARDIAC: RRR, no murmurs, rubs, gallops RESPIRATORY:  Clear to auscultation without rales, wheezing or rhonchi  ABDOMEN: Obese, Soft, non-tender, non-distended MUSCULOSKELETAL:  No edema; No deformity  SKIN: Warm and dry NEUROLOGIC:  Alert and oriented x 3 PSYCHIATRIC:  Normal affect   ASSESSMENT:    NICM (nonischemic cardiomyopathy) (HCC) Most recent EF 30-35%- echo July 2021 Coronary CTA negative for CAD Aug 2021 She was unable to tolerate Entresto- "dry mouth", will put her back on Losartan at 50 mg daily  Morbid obesity due to excess calories (HCC) Last BMI 38 but the patient declines weight secondary to her knee DJD- her weight may be higher than 240 lbs  Obstructive sleep apnea She has seen dr Sep 2021 and sleep study has again  been recomended  HTN (hypertension) B/P 118/68 today- will start her on Coreg and Losartan 50 mg. F/U virtual visit in 2 weeks.  LBBB (left bundle branch block) I'm not sure she would be a candidate for a BiV device but will check a f/u echo on medical Rx in 6 weeks.  F/U with Dr Maple Hudson after this.  Chronic combined systolic and diastolic heart failure (HCC) Lungs clear, no orthopnea  PLAN:    Stop Entresto.  Start Losartan 50 mg daily.  Remain off Toprol- start Coreg 12.5mg  BID.  Virtual visit in 2 weeks, echo in 6 weeks, then f/u with Dr Jens Som.    Medication Adjustments/Labs and Tests Ordered: Current medicines are reviewed at length with the patient today.  Concerns regarding medicines are outlined above.  Orders Placed This Encounter  Procedures  . ECHOCARDIOGRAM COMPLETE   Meds ordered this encounter  Medications  . losartan (COZAAR) 50 MG tablet    Sig: Take 1 tablet (50 mg total) by mouth daily.    Dispense:  30 tablet    Refill:  3  . carvedilol (COREG) 12.5 MG tablet    Sig: Take 1 tablet (12.5 mg total) by mouth 2 (two) times daily.    Dispense:  60 tablet    Refill:  3    Patient Instructions  Medication Instructions:   STOP Entresto  START Losartan 50 mg daily.  START Carvedilol 12.5 mg twice daily.  *If you need a refill on your cardiac medications before your next appointment, please call your pharmacy*    Testing/Procedures: Your physician has requested that you have an echocardiogram in 6 weeks (before follow-up with Dr. Jens Som). Echocardiography is a painless test that uses sound waves to create images of your heart. It provides your doctor with information about  the size and shape of your heart and how well your heart's chambers and valves are working. This procedure takes approximately one hour. There are no restrictions for this procedure.    Follow-Up: At [facility], you and your health needs are our priority.  As part of our  continuing mission to provide you with exceptional heart care, we have created designated Provider Care Teams.  These Care Teams include your primary Cardiologist (physician) and Advanced Practice Providers (APPs -  Physician Assistants and Nurse Practitioners) who all work together to provide you with the care you need, when you need it.  We recommend signing up for the patient portal called "MyChart".  Sign up information is provided on this After Visit Summary.  MyChart is used to connect with patients for Virtual Visits (Telemedicine).  Patients are able to view lab/test results, encounter notes, upcoming appointments, etc.  Non-urgent messages can be sent to your provider as well.   To learn more about what you can do with MyChart, go to [email or web address].    Your next appointment:  Tuesday, 04/03/20 at 1:45 PM VIRTUAL with Corine Shelter, pA   Friday, 04/27/20 at 2:00 PM IN OFFICE with Dr. Jens Som         Signed, Corine Shelter, PA-C  03/09/2020 3:05 PM    [facility]

## 2020-03-13 DIAGNOSIS — I110 Hypertensive heart disease with heart failure: Secondary | ICD-10-CM | POA: Diagnosis not present

## 2020-03-13 DIAGNOSIS — E785 Hyperlipidemia, unspecified: Secondary | ICD-10-CM | POA: Diagnosis not present

## 2020-03-13 DIAGNOSIS — Z993 Dependence on wheelchair: Secondary | ICD-10-CM | POA: Diagnosis not present

## 2020-03-13 DIAGNOSIS — K219 Gastro-esophageal reflux disease without esophagitis: Secondary | ICD-10-CM | POA: Diagnosis not present

## 2020-03-13 DIAGNOSIS — G894 Chronic pain syndrome: Secondary | ICD-10-CM | POA: Diagnosis not present

## 2020-03-13 DIAGNOSIS — M1990 Unspecified osteoarthritis, unspecified site: Secondary | ICD-10-CM | POA: Diagnosis not present

## 2020-03-13 DIAGNOSIS — M79602 Pain in left arm: Secondary | ICD-10-CM | POA: Diagnosis not present

## 2020-03-13 DIAGNOSIS — I5020 Unspecified systolic (congestive) heart failure: Secondary | ICD-10-CM | POA: Diagnosis not present

## 2020-03-13 DIAGNOSIS — M5000 Cervical disc disorder with myelopathy, unspecified cervical region: Secondary | ICD-10-CM | POA: Diagnosis not present

## 2020-03-13 DIAGNOSIS — R51 Headache with orthostatic component, not elsewhere classified: Secondary | ICD-10-CM | POA: Diagnosis not present

## 2020-03-13 DIAGNOSIS — M48062 Spinal stenosis, lumbar region with neurogenic claudication: Secondary | ICD-10-CM | POA: Diagnosis not present

## 2020-03-13 DIAGNOSIS — D649 Anemia, unspecified: Secondary | ICD-10-CM | POA: Diagnosis not present

## 2020-03-13 DIAGNOSIS — M545 Low back pain: Secondary | ICD-10-CM | POA: Diagnosis not present

## 2020-03-13 DIAGNOSIS — I429 Cardiomyopathy, unspecified: Secondary | ICD-10-CM | POA: Diagnosis not present

## 2020-03-14 ENCOUNTER — Other Ambulatory Visit: Payer: Self-pay | Admitting: Physician Assistant

## 2020-03-15 DIAGNOSIS — G894 Chronic pain syndrome: Secondary | ICD-10-CM | POA: Diagnosis not present

## 2020-03-15 DIAGNOSIS — I5020 Unspecified systolic (congestive) heart failure: Secondary | ICD-10-CM | POA: Diagnosis not present

## 2020-03-15 DIAGNOSIS — M545 Low back pain: Secondary | ICD-10-CM | POA: Diagnosis not present

## 2020-03-15 DIAGNOSIS — M48062 Spinal stenosis, lumbar region with neurogenic claudication: Secondary | ICD-10-CM | POA: Diagnosis not present

## 2020-03-15 DIAGNOSIS — E785 Hyperlipidemia, unspecified: Secondary | ICD-10-CM | POA: Diagnosis not present

## 2020-03-15 DIAGNOSIS — R51 Headache with orthostatic component, not elsewhere classified: Secondary | ICD-10-CM | POA: Diagnosis not present

## 2020-03-15 DIAGNOSIS — I429 Cardiomyopathy, unspecified: Secondary | ICD-10-CM | POA: Diagnosis not present

## 2020-03-15 DIAGNOSIS — D649 Anemia, unspecified: Secondary | ICD-10-CM | POA: Diagnosis not present

## 2020-03-15 DIAGNOSIS — I110 Hypertensive heart disease with heart failure: Secondary | ICD-10-CM | POA: Diagnosis not present

## 2020-03-15 DIAGNOSIS — M1990 Unspecified osteoarthritis, unspecified site: Secondary | ICD-10-CM | POA: Diagnosis not present

## 2020-03-15 DIAGNOSIS — M5000 Cervical disc disorder with myelopathy, unspecified cervical region: Secondary | ICD-10-CM | POA: Diagnosis not present

## 2020-03-15 DIAGNOSIS — K219 Gastro-esophageal reflux disease without esophagitis: Secondary | ICD-10-CM | POA: Diagnosis not present

## 2020-03-15 DIAGNOSIS — M79602 Pain in left arm: Secondary | ICD-10-CM | POA: Diagnosis not present

## 2020-03-15 DIAGNOSIS — Z993 Dependence on wheelchair: Secondary | ICD-10-CM | POA: Diagnosis not present

## 2020-03-16 ENCOUNTER — Telehealth: Payer: Self-pay | Admitting: Neurology

## 2020-03-16 NOTE — Telephone Encounter (Signed)
The patient did not want to do her scheduled Physical Therapy today with [facility].

## 2020-03-19 DIAGNOSIS — K219 Gastro-esophageal reflux disease without esophagitis: Secondary | ICD-10-CM | POA: Diagnosis not present

## 2020-03-19 DIAGNOSIS — Z993 Dependence on wheelchair: Secondary | ICD-10-CM | POA: Diagnosis not present

## 2020-03-19 DIAGNOSIS — M48062 Spinal stenosis, lumbar region with neurogenic claudication: Secondary | ICD-10-CM | POA: Diagnosis not present

## 2020-03-19 DIAGNOSIS — M79602 Pain in left arm: Secondary | ICD-10-CM | POA: Diagnosis not present

## 2020-03-19 DIAGNOSIS — I429 Cardiomyopathy, unspecified: Secondary | ICD-10-CM | POA: Diagnosis not present

## 2020-03-19 DIAGNOSIS — I110 Hypertensive heart disease with heart failure: Secondary | ICD-10-CM | POA: Diagnosis not present

## 2020-03-19 DIAGNOSIS — M5000 Cervical disc disorder with myelopathy, unspecified cervical region: Secondary | ICD-10-CM | POA: Diagnosis not present

## 2020-03-19 DIAGNOSIS — D649 Anemia, unspecified: Secondary | ICD-10-CM | POA: Diagnosis not present

## 2020-03-19 DIAGNOSIS — R51 Headache with orthostatic component, not elsewhere classified: Secondary | ICD-10-CM | POA: Diagnosis not present

## 2020-03-19 DIAGNOSIS — E785 Hyperlipidemia, unspecified: Secondary | ICD-10-CM | POA: Diagnosis not present

## 2020-03-19 DIAGNOSIS — M1990 Unspecified osteoarthritis, unspecified site: Secondary | ICD-10-CM | POA: Diagnosis not present

## 2020-03-19 DIAGNOSIS — I5020 Unspecified systolic (congestive) heart failure: Secondary | ICD-10-CM | POA: Diagnosis not present

## 2020-03-19 DIAGNOSIS — G894 Chronic pain syndrome: Secondary | ICD-10-CM | POA: Diagnosis not present

## 2020-03-20 DIAGNOSIS — G8220 Paraplegia, unspecified: Secondary | ICD-10-CM | POA: Diagnosis not present

## 2020-03-20 DIAGNOSIS — G959 Disease of spinal cord, unspecified: Secondary | ICD-10-CM | POA: Diagnosis not present

## 2020-03-21 DIAGNOSIS — I5020 Unspecified systolic (congestive) heart failure: Secondary | ICD-10-CM | POA: Diagnosis not present

## 2020-03-21 DIAGNOSIS — E785 Hyperlipidemia, unspecified: Secondary | ICD-10-CM | POA: Diagnosis not present

## 2020-03-21 DIAGNOSIS — D649 Anemia, unspecified: Secondary | ICD-10-CM | POA: Diagnosis not present

## 2020-03-21 DIAGNOSIS — I429 Cardiomyopathy, unspecified: Secondary | ICD-10-CM | POA: Diagnosis not present

## 2020-03-21 DIAGNOSIS — K219 Gastro-esophageal reflux disease without esophagitis: Secondary | ICD-10-CM | POA: Diagnosis not present

## 2020-03-21 DIAGNOSIS — Z993 Dependence on wheelchair: Secondary | ICD-10-CM | POA: Diagnosis not present

## 2020-03-21 DIAGNOSIS — M48062 Spinal stenosis, lumbar region with neurogenic claudication: Secondary | ICD-10-CM | POA: Diagnosis not present

## 2020-03-21 DIAGNOSIS — M79602 Pain in left arm: Secondary | ICD-10-CM | POA: Diagnosis not present

## 2020-03-21 DIAGNOSIS — M5000 Cervical disc disorder with myelopathy, unspecified cervical region: Secondary | ICD-10-CM | POA: Diagnosis not present

## 2020-03-21 DIAGNOSIS — M1990 Unspecified osteoarthritis, unspecified site: Secondary | ICD-10-CM | POA: Diagnosis not present

## 2020-03-21 DIAGNOSIS — I110 Hypertensive heart disease with heart failure: Secondary | ICD-10-CM | POA: Diagnosis not present

## 2020-03-21 DIAGNOSIS — R51 Headache with orthostatic component, not elsewhere classified: Secondary | ICD-10-CM | POA: Diagnosis not present

## 2020-03-21 DIAGNOSIS — G894 Chronic pain syndrome: Secondary | ICD-10-CM | POA: Diagnosis not present

## 2020-03-22 ENCOUNTER — Encounter: Payer: Self-pay | Admitting: Cardiology

## 2020-03-23 DIAGNOSIS — M79602 Pain in left arm: Secondary | ICD-10-CM | POA: Diagnosis not present

## 2020-03-23 DIAGNOSIS — I5020 Unspecified systolic (congestive) heart failure: Secondary | ICD-10-CM | POA: Diagnosis not present

## 2020-03-23 DIAGNOSIS — E785 Hyperlipidemia, unspecified: Secondary | ICD-10-CM | POA: Diagnosis not present

## 2020-03-23 DIAGNOSIS — D649 Anemia, unspecified: Secondary | ICD-10-CM | POA: Diagnosis not present

## 2020-03-23 DIAGNOSIS — M5000 Cervical disc disorder with myelopathy, unspecified cervical region: Secondary | ICD-10-CM | POA: Diagnosis not present

## 2020-03-23 DIAGNOSIS — I429 Cardiomyopathy, unspecified: Secondary | ICD-10-CM | POA: Diagnosis not present

## 2020-03-23 DIAGNOSIS — M1990 Unspecified osteoarthritis, unspecified site: Secondary | ICD-10-CM | POA: Diagnosis not present

## 2020-03-23 DIAGNOSIS — R51 Headache with orthostatic component, not elsewhere classified: Secondary | ICD-10-CM | POA: Diagnosis not present

## 2020-03-23 DIAGNOSIS — G894 Chronic pain syndrome: Secondary | ICD-10-CM | POA: Diagnosis not present

## 2020-03-23 DIAGNOSIS — I110 Hypertensive heart disease with heart failure: Secondary | ICD-10-CM | POA: Diagnosis not present

## 2020-03-23 DIAGNOSIS — K219 Gastro-esophageal reflux disease without esophagitis: Secondary | ICD-10-CM | POA: Diagnosis not present

## 2020-03-23 DIAGNOSIS — M48062 Spinal stenosis, lumbar region with neurogenic claudication: Secondary | ICD-10-CM | POA: Diagnosis not present

## 2020-03-23 DIAGNOSIS — Z993 Dependence on wheelchair: Secondary | ICD-10-CM | POA: Diagnosis not present

## 2020-03-27 DIAGNOSIS — E785 Hyperlipidemia, unspecified: Secondary | ICD-10-CM | POA: Diagnosis not present

## 2020-03-27 DIAGNOSIS — I5020 Unspecified systolic (congestive) heart failure: Secondary | ICD-10-CM | POA: Diagnosis not present

## 2020-03-27 DIAGNOSIS — D649 Anemia, unspecified: Secondary | ICD-10-CM | POA: Diagnosis not present

## 2020-03-27 DIAGNOSIS — Z993 Dependence on wheelchair: Secondary | ICD-10-CM | POA: Diagnosis not present

## 2020-03-27 DIAGNOSIS — R51 Headache with orthostatic component, not elsewhere classified: Secondary | ICD-10-CM | POA: Diagnosis not present

## 2020-03-27 DIAGNOSIS — I429 Cardiomyopathy, unspecified: Secondary | ICD-10-CM | POA: Diagnosis not present

## 2020-03-27 DIAGNOSIS — M1990 Unspecified osteoarthritis, unspecified site: Secondary | ICD-10-CM | POA: Diagnosis not present

## 2020-03-27 DIAGNOSIS — G894 Chronic pain syndrome: Secondary | ICD-10-CM | POA: Diagnosis not present

## 2020-03-27 DIAGNOSIS — K219 Gastro-esophageal reflux disease without esophagitis: Secondary | ICD-10-CM | POA: Diagnosis not present

## 2020-03-27 DIAGNOSIS — M79602 Pain in left arm: Secondary | ICD-10-CM | POA: Diagnosis not present

## 2020-03-27 DIAGNOSIS — I110 Hypertensive heart disease with heart failure: Secondary | ICD-10-CM | POA: Diagnosis not present

## 2020-03-27 DIAGNOSIS — M5000 Cervical disc disorder with myelopathy, unspecified cervical region: Secondary | ICD-10-CM | POA: Diagnosis not present

## 2020-03-27 DIAGNOSIS — M48062 Spinal stenosis, lumbar region with neurogenic claudication: Secondary | ICD-10-CM | POA: Diagnosis not present

## 2020-03-29 DIAGNOSIS — K219 Gastro-esophageal reflux disease without esophagitis: Secondary | ICD-10-CM | POA: Diagnosis not present

## 2020-03-29 DIAGNOSIS — M5000 Cervical disc disorder with myelopathy, unspecified cervical region: Secondary | ICD-10-CM | POA: Diagnosis not present

## 2020-03-29 DIAGNOSIS — M1990 Unspecified osteoarthritis, unspecified site: Secondary | ICD-10-CM | POA: Diagnosis not present

## 2020-03-29 DIAGNOSIS — I5020 Unspecified systolic (congestive) heart failure: Secondary | ICD-10-CM | POA: Diagnosis not present

## 2020-03-29 DIAGNOSIS — Z993 Dependence on wheelchair: Secondary | ICD-10-CM | POA: Diagnosis not present

## 2020-03-29 DIAGNOSIS — D649 Anemia, unspecified: Secondary | ICD-10-CM | POA: Diagnosis not present

## 2020-03-29 DIAGNOSIS — I429 Cardiomyopathy, unspecified: Secondary | ICD-10-CM | POA: Diagnosis not present

## 2020-03-29 DIAGNOSIS — R51 Headache with orthostatic component, not elsewhere classified: Secondary | ICD-10-CM | POA: Diagnosis not present

## 2020-03-29 DIAGNOSIS — I110 Hypertensive heart disease with heart failure: Secondary | ICD-10-CM | POA: Diagnosis not present

## 2020-03-29 DIAGNOSIS — M48062 Spinal stenosis, lumbar region with neurogenic claudication: Secondary | ICD-10-CM | POA: Diagnosis not present

## 2020-03-29 DIAGNOSIS — E785 Hyperlipidemia, unspecified: Secondary | ICD-10-CM | POA: Diagnosis not present

## 2020-03-29 DIAGNOSIS — M79602 Pain in left arm: Secondary | ICD-10-CM | POA: Diagnosis not present

## 2020-03-29 DIAGNOSIS — G894 Chronic pain syndrome: Secondary | ICD-10-CM | POA: Diagnosis not present

## 2020-03-30 ENCOUNTER — Other Ambulatory Visit: Payer: Self-pay

## 2020-03-30 ENCOUNTER — Ambulatory Visit (HOSPITAL_COMMUNITY)
Admission: RE | Admit: 2020-03-30 | Discharge: 2020-03-30 | Disposition: A | Discharge status: Home / Self Care | Payer: Medicare Other | Source: Ambulatory Visit | Attending: Cardiology | Admitting: Cardiology

## 2020-03-30 DIAGNOSIS — I5042 Chronic combined systolic (congestive) and diastolic (congestive) heart failure: Secondary | ICD-10-CM | POA: Diagnosis not present

## 2020-03-30 DIAGNOSIS — I428 Other cardiomyopathies: Secondary | ICD-10-CM | POA: Insufficient documentation

## 2020-03-30 DIAGNOSIS — I429 Cardiomyopathy, unspecified: Secondary | ICD-10-CM | POA: Diagnosis not present

## 2020-03-30 DIAGNOSIS — I509 Heart failure, unspecified: Secondary | ICD-10-CM | POA: Diagnosis not present

## 2020-03-30 LAB — ECHOCARDIOGRAM COMPLETE
Area-P 1/2: 4.8 cm2
Calc EF: 32.8 %
S' Lateral: 4.8 cm
Single Plane A2C EF: 28.6 %
Single Plane A4C EF: 33.8 %

## 2020-03-30 NOTE — Progress Notes (Signed)
  Echocardiogram 2D Echocardiogram has been performed.  Delcie Roch 03/30/2020, 9:59 AM

## 2020-04-03 ENCOUNTER — Telehealth: Payer: Medicare Other | Admitting: Cardiology

## 2020-04-03 ENCOUNTER — Other Ambulatory Visit: Payer: Self-pay

## 2020-04-03 MED ORDER — CARVEDILOL 12.5 MG PO TABS: 12.5000 mg | ORAL_TABLET | Freq: Two times a day (BID) | ORAL | 4 refills | Status: DC

## 2020-04-05 ENCOUNTER — Telehealth: Payer: Self-pay

## 2020-04-05 NOTE — Telephone Encounter (Signed)
Telephone call from [facility]. Pt had to cancel her physical therapy again today. Pt state she is out of Town due her home is being renovated.    Pt missed her appt on 10/1 as well

## 2020-04-09 ENCOUNTER — Other Ambulatory Visit: Payer: Self-pay

## 2020-04-09 ENCOUNTER — Telehealth: Payer: Medicare Other | Admitting: Cardiology

## 2020-04-09 DIAGNOSIS — I5020 Unspecified systolic (congestive) heart failure: Secondary | ICD-10-CM | POA: Diagnosis not present

## 2020-04-09 DIAGNOSIS — R51 Headache with orthostatic component, not elsewhere classified: Secondary | ICD-10-CM | POA: Diagnosis not present

## 2020-04-09 DIAGNOSIS — M79602 Pain in left arm: Secondary | ICD-10-CM | POA: Diagnosis not present

## 2020-04-09 DIAGNOSIS — D649 Anemia, unspecified: Secondary | ICD-10-CM | POA: Diagnosis not present

## 2020-04-09 DIAGNOSIS — M48062 Spinal stenosis, lumbar region with neurogenic claudication: Secondary | ICD-10-CM | POA: Diagnosis not present

## 2020-04-09 DIAGNOSIS — E785 Hyperlipidemia, unspecified: Secondary | ICD-10-CM | POA: Diagnosis not present

## 2020-04-09 DIAGNOSIS — I429 Cardiomyopathy, unspecified: Secondary | ICD-10-CM | POA: Diagnosis not present

## 2020-04-09 DIAGNOSIS — Z993 Dependence on wheelchair: Secondary | ICD-10-CM | POA: Diagnosis not present

## 2020-04-09 DIAGNOSIS — M1990 Unspecified osteoarthritis, unspecified site: Secondary | ICD-10-CM | POA: Diagnosis not present

## 2020-04-09 DIAGNOSIS — K219 Gastro-esophageal reflux disease without esophagitis: Secondary | ICD-10-CM | POA: Diagnosis not present

## 2020-04-09 DIAGNOSIS — G894 Chronic pain syndrome: Secondary | ICD-10-CM | POA: Diagnosis not present

## 2020-04-09 DIAGNOSIS — I110 Hypertensive heart disease with heart failure: Secondary | ICD-10-CM | POA: Diagnosis not present

## 2020-04-09 DIAGNOSIS — M5000 Cervical disc disorder with myelopathy, unspecified cervical region: Secondary | ICD-10-CM | POA: Diagnosis not present

## 2020-04-11 ENCOUNTER — Other Ambulatory Visit: Payer: Self-pay | Admitting: Family

## 2020-04-11 DIAGNOSIS — R601 Generalized edema: Secondary | ICD-10-CM

## 2020-04-12 ENCOUNTER — Telehealth (INDEPENDENT_AMBULATORY_CARE_PROVIDER_SITE_OTHER): Payer: Medicare Other | Admitting: Student

## 2020-04-12 ENCOUNTER — Other Ambulatory Visit: Payer: Self-pay

## 2020-04-12 ENCOUNTER — Encounter: Payer: Self-pay | Admitting: Student

## 2020-04-12 VITALS — Ht 66.0 in | Wt 220.0 lb

## 2020-04-12 DIAGNOSIS — M5000 Cervical disc disorder with myelopathy, unspecified cervical region: Secondary | ICD-10-CM | POA: Diagnosis not present

## 2020-04-12 DIAGNOSIS — I5020 Unspecified systolic (congestive) heart failure: Secondary | ICD-10-CM | POA: Diagnosis not present

## 2020-04-12 DIAGNOSIS — M79602 Pain in left arm: Secondary | ICD-10-CM | POA: Diagnosis not present

## 2020-04-12 DIAGNOSIS — G4733 Obstructive sleep apnea (adult) (pediatric): Secondary | ICD-10-CM

## 2020-04-12 DIAGNOSIS — M48062 Spinal stenosis, lumbar region with neurogenic claudication: Secondary | ICD-10-CM | POA: Diagnosis not present

## 2020-04-12 DIAGNOSIS — D649 Anemia, unspecified: Secondary | ICD-10-CM | POA: Diagnosis not present

## 2020-04-12 DIAGNOSIS — I10 Essential (primary) hypertension: Secondary | ICD-10-CM

## 2020-04-12 DIAGNOSIS — Z993 Dependence on wheelchair: Secondary | ICD-10-CM | POA: Diagnosis not present

## 2020-04-12 DIAGNOSIS — I5042 Chronic combined systolic (congestive) and diastolic (congestive) heart failure: Secondary | ICD-10-CM

## 2020-04-12 DIAGNOSIS — I429 Cardiomyopathy, unspecified: Secondary | ICD-10-CM | POA: Diagnosis not present

## 2020-04-12 DIAGNOSIS — I110 Hypertensive heart disease with heart failure: Secondary | ICD-10-CM | POA: Diagnosis not present

## 2020-04-12 DIAGNOSIS — Z79899 Other long term (current) drug therapy: Secondary | ICD-10-CM | POA: Diagnosis not present

## 2020-04-12 DIAGNOSIS — M1990 Unspecified osteoarthritis, unspecified site: Secondary | ICD-10-CM | POA: Diagnosis not present

## 2020-04-12 DIAGNOSIS — E785 Hyperlipidemia, unspecified: Secondary | ICD-10-CM | POA: Diagnosis not present

## 2020-04-12 DIAGNOSIS — K219 Gastro-esophageal reflux disease without esophagitis: Secondary | ICD-10-CM | POA: Diagnosis not present

## 2020-04-12 DIAGNOSIS — I428 Other cardiomyopathies: Secondary | ICD-10-CM | POA: Diagnosis not present

## 2020-04-12 DIAGNOSIS — R51 Headache with orthostatic component, not elsewhere classified: Secondary | ICD-10-CM | POA: Diagnosis not present

## 2020-04-12 DIAGNOSIS — G894 Chronic pain syndrome: Secondary | ICD-10-CM | POA: Diagnosis not present

## 2020-04-12 NOTE — Progress Notes (Signed)
Virtual Visit via Telephone Note   This visit type was conducted due to national recommendations for restrictions regarding the COVID-19 Pandemic (e.g. social distancing) in an effort to limit this patient's exposure and mitigate transmission in our community.  Due to her co-morbid illnesses, this patient is at least at moderate risk for complications without adequate follow up.  This format is felt to be most appropriate for this patient at this time.  The patient did not have access to video technology/had technical difficulties with video requiring transitioning to audio format only (telephone).  All issues noted in this document were discussed and addressed.  No physical exam could be performed with this format.  Please refer to the patient's chart for her  consent to telehealth for [facility].    Date:  04/12/2020   ID:  Briana Miller, DOB Nov 07, 1955, MRN [id number] The patient was identified using 2 identifiers.  Patient Location: Home Provider Location: Office/Clinic  PCP:  Sandford Craze, NP  Cardiologist:  Olga Millers, MD  Electrophysiologist:  None   Evaluation Performed:  Follow-Up Visit  Chief Complaint: 3 week visit  History of Present Illness:    Briana Miller is a 64 y.o. female with past medical history of chronic combined systolic and diastolic CHF/NICM (EF 45-50% in 07/2016, at 30-35% by repeat echo in 12/2019 with Coronary CT in 01/2020 showing no evidence of CAD), HTN, HLD, OSA and DJD who presents for a 3-week follow-up telehealth visit.   She was last examined by Briana Shelter, PA-C on 03/09/2020 and had recently been switched from Losartan to [facility] but reported the medication was making her mouth dry. She also thought she was suppose to stop Metoprolol and had discontinued this. Sherryll Burger was discontinued and she was restarted on Losartan 50 mg daily and Coreg 12.5 mg twice daily was started in place of Lopressor. She did have a repeat  echocardiogram earlier this month which showed her EF remained reduced at 30 to 35% with global hypokinesis, grade 1 diastolic dysfunction, and mild MR. RV function was normal.  In talking with the patient today, she reports having baseline dyspnea on exertion which has overall been stable. Reports this has been commented on by PT as she develops shortness of breath with minimal exercise. She denies any associated chest pain or palpitations. No recent orthopnea or PND. She does experience lower extremity edema and utilizes her compression stockings on a daily basis. She is mostly wheelchair-bound at baseline and is unable to track daily weights. Reports taking Torsemide 20 mg daily and feels like her volume status has overall been stable.  The patient does not have symptoms concerning for COVID-19 infection (fever, chills, cough, or new shortness of breath).    Past Medical History:  Diagnosis Date  . Acute systolic congestive heart failure (HCC) 03/01/2014  . Allergy   . Anemia   . Arthritis   . Cardiomyopathy (HCC)   . Cervical myelopathy (HCC)   . CHF (congestive heart failure) (HCC)   . GERD (gastroesophageal reflux disease)   . Heart disease   . Hyperlipidemia   . Hypertension   . Leg pain   . Overactive bladder    Past Surgical History:  Procedure Laterality Date  . ANTERIOR CERVICAL DECOMP/DISCECTOMY FUSION N/A 08/03/2016   Procedure: ANTERIOR CERVICAL DECOMPRESSION/DISCECTOMY FUSION C4-5, C5-6;  Surgeon: Barnett Abu, MD;  Location: [facility] OR;  Service: Neurosurgery;  Laterality: N/A;  . COLONOSCOPY    . COLONOSCOPY WITH PROPOFOL N/A 02/24/2019  Procedure: COLONOSCOPY WITH PROPOFOL;  Surgeon: Rachael Fee, MD;  Location: [facility];  Service: Endoscopy;  Laterality: N/A;  . KNEE ARTHROSCOPY     2007  . ROTATOR CUFF REPAIR Right 03/23/13  . TOTAL KNEE ARTHROPLASTY  09/22/2011   Procedure: TOTAL KNEE ARTHROPLASTY;  Surgeon: Raymon Mutton, MD;  Location: [facility] OR;  Service:  Orthopedics;  Laterality: Right;     Current Meds  Medication Sig  . carvedilol (COREG) 12.5 MG tablet Take 1 tablet (12.5 mg total) by mouth 2 (two) times daily.  . Cholecalciferol (VITAMIN D3 PO) Take 1 tablet by mouth daily.   Marland Kitchen latanoprost (XALATAN) 0.005 % ophthalmic solution Place 1 drop into both eyes at bedtime.   Marland Kitchen losartan (COZAAR) 50 MG tablet Take 1 tablet (50 mg total) by mouth daily.  . Melatonin 3 MG TABS Take 1 tablet by mouth as needed.   . nortriptyline (PAMELOR) 25 MG capsule TAKE 2 CAPSULES BY MOUTH AT BEDTIME.  Marland Kitchen NYAMYC powder APPLY TO AFFECTED AREA(S)  TOPICALLY TWICE DAILY  . pantoprazole (PROTONIX) 40 MG tablet TAKE 1 TABLET BY MOUTH  TWICE DAILY  . timolol (BETIMOL) 0.5 % ophthalmic solution Place 1 drop into both eyes every morning.   . torsemide (DEMADEX) 10 MG tablet TAKE 1 TABLET BY MOUTH  ALTERNATING WITH 2 TABLETS  BY MOUTH EVERY OTHER DAY  . [DISCONTINUED] gabapentin (NEURONTIN) 300 MG capsule Take 3 capsules (900 mg total) by mouth 3 (three) times daily.  . [DISCONTINUED] potassium chloride SA (KLOR-CON) 20 MEQ tablet Take 1 tablet (20 mEq total) by mouth daily.     Allergies:   Entresto [sacubitril-valsartan]   Social History   Tobacco Use  . Smoking status: Never Smoker  . Smokeless tobacco: Never Used  Substance Use Topics  . Alcohol use: Not Currently    Alcohol/week: 2.0 standard drinks    Types: 2 Glasses of wine per week  . Drug use: No     Family Hx: The patient's family history includes Asthma in her father; Cancer in her mother; Colon cancer in her mother; Diabetes in her father; Glaucoma in her father; Heart attack (age of onset: 6) in her father; Hypertension in her father; Kidney failure in her father. There is no history of Esophageal cancer, Stomach cancer, or Rectal cancer.  ROS:   Please see the history of present illness.     All other systems reviewed and are negative.   Prior CV studies:   The following studies were  reviewed today:  Coronary CT: 01/2020 CORONARY ARTERIES:  Right dominace; normal origin  RCA gives rise to small PDA; there is no plaque.  LM gives rise to LAD and LCX  LAD gives rise to 2 small diagonals; there is no plaque  Lcx gives rise to OM; there is no plaque.  CORONARY CALCIUM:  Total Agatston Score:                0  MESA database percentile:        0  AORTA AND PULMONARY MEASUREMENTS:  Aortic root (21 - 2mm):            29mm  at the annulus  Aortic atherosclerosis noted.  Main pulmonaryartery:  ( <  30 mm): 22 mm  : 1 Ca score 0; percentile 0.  2 normal coronary origin with right dominance.  3 no CAD noted; CAD-RADS 0.  4 aortic atherosclerosis noted.   Echocardiogram: 03/2020 IMPRESSIONS    1. Left  ventricular ejection fraction, by estimation, is 30 to 35%. The  left ventricle has moderately decreased function. The left ventricle  demonstrates global hypokinesis. The left ventricular internal cavity size  was mildly dilated. Left ventricular  diastolic parameters are consistent with Grade I diastolic dysfunction  (impaired relaxation).  2. Right ventricular systolic function is normal. The right ventricular  size is normal. There is normal pulmonary artery systolic pressure. The  estimated right ventricular systolic pressure is 26.0 mmHg.  3. Left atrial size was moderately dilated.  4. The mitral valve was not well visualized. Mild mitral valve  regurgitation.  5. The aortic valve is normal in structure. Aortic valve regurgitation is  not visualized.  6. The inferior vena cava is normal in size with greater than 50%  respiratory variability, suggesting right atrial pressure of 3 mmHg.   Comparison(s): No significant change from prior study. Prior images  reviewed side by side.   Labs/Other Tests and Data Reviewed:    EKG:  An ECG dated 11/04/2019 was personally reviewed today and demonstrated: NSR, HR 68 with known  LBBB.   Recent Labs: 01/13/2020: BUN 27; Creatinine, Ser 1.17; Potassium 4.6; Sodium 140   Recent Lipid Panel Lab Results  Component Value Date/Time   CHOL 170 12/02/2019 02:34 PM   TRIG 140 12/02/2019 02:34 PM   HDL 49 (L) 12/02/2019 02:34 PM   CHOLHDL 3.5 12/02/2019 02:34 PM   LDLCALC 97 12/02/2019 02:34 PM    Wt Readings from Last 3 Encounters:  04/12/20 220 lb (99.8 kg)  02/02/20 240 lb (108.9 kg)  01/09/20 (!) 220 lb (99.8 kg)      Objective:    Vital Signs:  Ht 5\' 6"  (1.676 m)   Wt 220 lb (99.8 kg)   LMP 06/17/2007   BMI 35.51 kg/m    General: Pleasant female sounding in NAD Psych: Normal affect. Neuro: Alert and oriented X 3. Lungs:  Resp regular and unlabored while talking on the phone.    ASSESSMENT & PLAN:    1. Chronic Combined Systolic and Diastolic CHF/NICM - Her EF was reduced at 30 to 35% by echocardiogram in 12/2019 with Coronary CT the following month showing no evidence of CAD. EF remained reduced by repeat imaging earlier this month. - She continues to have dyspnea on exertion but denies any orthopnea or PND. She does report lower extremity edema and suspect this is combination of her CHF and likely dependent edema given she is mostly wheelchair-bound. We did review the importance of monitoring her sodium and fluid intake. - She is currently taking Losartan 50 mg daily, Coreg 12.5 mg twice daily and Torsemide 20 mg daily.  Was previously intolerant to [facility] as outlined above. She has not had repeat labs since her medication changes and I recommended we recheck a repeat BMET. If renal function and electrolytes remain stable, would plan to add Spironolactone to her medication regimen.  - Will ultimately plan for a repeat echo in several months once medical therapy has been optimized.  If EF remains reduced, would consider EP referral for consideration of CRT at that time given her known LBBB.  2. HTN - She does not have a blood pressure cuff at home but  reports this has been well controlled when checked at prior office visits. Continue current regimen for now with Losartan 50 mg daily and Coreg 12.5 mg twice daily.  3.  OSA - She has been evaluated by Pulmonology with a sleep study recommended. Appears this was  ordered in 01/2020 but not yet performed.    COVID-19 Education: The signs and symptoms of COVID-19 were discussed with the patient and how to seek care for testing (follow up with PCP or arrange E-visit).  The importance of social distancing was discussed today.  Time:   Today, I have spent 13 minutes with the patient with telehealth technology discussing the above problems.     Medication Adjustments/Labs and Tests Ordered: Current medicines are reviewed at length with the patient today.  Concerns regarding medicines are outlined above.   Tests Ordered: Orders Placed This Encounter  Procedures  . Basic Metabolic Panel (BMET)    Medication Changes: No orders of the defined types were placed in this encounter.   Follow Up:  Keep scheduled follow-up with Dr. Jens Som in 04/2020. Confirmed with the patient she lives in [facility] so will keep follow-up at [facility].   Signed, Ellsworth Lennox, PA-C  04/12/2020 4:52 PM    [facility]

## 2020-04-12 NOTE — Patient Instructions (Signed)
Medication Instructions:  Your physician recommends that you continue on your current medications as directed. Please refer to the Current Medication list given to you today.  *If you need a refill on your cardiac medications before your next appointment, please call your pharmacy*   Lab Work: Your physician recommends that you return for lab work in: Today   If you have labs (blood work) drawn today and your tests are completely normal, you will receive your results only by:  MyChart Message (if you have MyChart) OR  A paper copy in the mail If you have any lab test that is abnormal or we need to change your treatment, we will call you to review the results.   Testing/Procedures: NONE   Follow-Up: At [facility], you and your health needs are our priority.  As part of our continuing mission to provide you with exceptional heart care, we have created designated Provider Care Teams.  These Care Teams include your primary Cardiologist (physician) and Advanced Practice Providers (APPs -  Physician Assistants and Nurse Practitioners) who all work together to provide you with the care you need, when you need it.  We recommend signing up for the patient portal called "MyChart".  Sign up information is provided on this After Visit Summary.  MyChart is used to connect with patients for Virtual Visits (Telemedicine).  Patients are able to view lab/test results, encounter notes, upcoming appointments, etc.  Non-urgent messages can be sent to your provider as well.   To learn more about what you can do with MyChart, go to [email or web address].    Your next appointment:   1 month(s)  The format for your next appointment:   In Person  Provider:   Dr. Jens Som    Other Instructions Thank you for choosing [facility]!

## 2020-04-16 DIAGNOSIS — I5020 Unspecified systolic (congestive) heart failure: Secondary | ICD-10-CM | POA: Diagnosis not present

## 2020-04-16 DIAGNOSIS — M1990 Unspecified osteoarthritis, unspecified site: Secondary | ICD-10-CM | POA: Diagnosis not present

## 2020-04-16 DIAGNOSIS — M545 Low back pain, unspecified: Secondary | ICD-10-CM | POA: Diagnosis not present

## 2020-04-16 DIAGNOSIS — D649 Anemia, unspecified: Secondary | ICD-10-CM | POA: Diagnosis not present

## 2020-04-16 DIAGNOSIS — M79602 Pain in left arm: Secondary | ICD-10-CM | POA: Diagnosis not present

## 2020-04-16 DIAGNOSIS — M48062 Spinal stenosis, lumbar region with neurogenic claudication: Secondary | ICD-10-CM | POA: Diagnosis not present

## 2020-04-16 DIAGNOSIS — K219 Gastro-esophageal reflux disease without esophagitis: Secondary | ICD-10-CM | POA: Diagnosis not present

## 2020-04-16 DIAGNOSIS — G894 Chronic pain syndrome: Secondary | ICD-10-CM | POA: Diagnosis not present

## 2020-04-16 DIAGNOSIS — E785 Hyperlipidemia, unspecified: Secondary | ICD-10-CM | POA: Diagnosis not present

## 2020-04-16 DIAGNOSIS — I110 Hypertensive heart disease with heart failure: Secondary | ICD-10-CM | POA: Diagnosis not present

## 2020-04-16 DIAGNOSIS — Z993 Dependence on wheelchair: Secondary | ICD-10-CM | POA: Diagnosis not present

## 2020-04-16 DIAGNOSIS — M5000 Cervical disc disorder with myelopathy, unspecified cervical region: Secondary | ICD-10-CM | POA: Diagnosis not present

## 2020-04-16 DIAGNOSIS — R51 Headache with orthostatic component, not elsewhere classified: Secondary | ICD-10-CM | POA: Diagnosis not present

## 2020-04-16 DIAGNOSIS — I429 Cardiomyopathy, unspecified: Secondary | ICD-10-CM | POA: Diagnosis not present

## 2020-04-20 DIAGNOSIS — G959 Disease of spinal cord, unspecified: Secondary | ICD-10-CM | POA: Diagnosis not present

## 2020-04-20 DIAGNOSIS — G8220 Paraplegia, unspecified: Secondary | ICD-10-CM | POA: Diagnosis not present

## 2020-04-23 NOTE — Progress Notes (Signed)
HPI: FU cardiomyopathy. Echocardiogram in October of 2014 showed an ejection fraction of 40-45%, mild left ventricular hypertrophy and mild left atrial enlargement. Nuclear study in December of 2014 showed an ejection fraction of 51% and normal perfusion. Cardiac CTA August 2021 showed calcium score of 0 and no coronary disease.  Last echocardiogram October 2021 showed ejection fraction 30 to 35%, mild left ventricular enlargement, grade 1 diastolic dysfunction, moderate left atrial enlargement, mild mitral regurgitation.  Sherryll Burger was discontinued previously as patient thought it was causing dry mouth. Since last seennotes increased dyspnea on exertion.  She does have chronic pedal edema.  She has an aching feeling in her chest occasionally that lasts seconds.  She has not had syncope.  Current Outpatient Medications  Medication Sig Dispense Refill   carvedilol (COREG) 12.5 MG tablet Take 1 tablet (12.5 mg total) by mouth 2 (two) times daily. 180 tablet 4   Cholecalciferol (VITAMIN D3 PO) Take 1 tablet by mouth daily.      gabapentin (NEURONTIN) 300 MG capsule Take 3 capsules (900 mg total) by mouth 3 (three) times daily. 810 capsule 0   latanoprost (XALATAN) 0.005 % ophthalmic solution Place 1 drop into both eyes at bedtime.   11   losartan (COZAAR) 50 MG tablet Take 1 tablet (50 mg total) by mouth daily. 90 tablet 1   Melatonin 3 MG TABS Take 1 tablet by mouth as needed.      nortriptyline (PAMELOR) 25 MG capsule TAKE 2 CAPSULES BY MOUTH AT BEDTIME. 60 capsule 5   NYAMYC powder APPLY TO AFFECTED AREA(S)  TOPICALLY TWICE DAILY 180 g 1   pantoprazole (PROTONIX) 40 MG tablet TAKE 1 TABLET BY MOUTH  TWICE DAILY 180 tablet 3   potassium chloride SA (KLOR-CON) 20 MEQ tablet Take 1 tablet (20 mEq total) by mouth daily. 90 tablet 0   timolol (BETIMOL) 0.5 % ophthalmic solution Place 1 drop into both eyes every morning.      torsemide (DEMADEX) 10 MG tablet TAKE 1 TABLET BY MOUTH   ALTERNATING WITH 2 TABLETS  BY MOUTH EVERY OTHER DAY (Patient taking differently: Take 20 mg by mouth daily. TAKE 1 TABLET BY MOUTH  ALTERNATING WITH 2 TABLETS  BY MOUTH EVERY OTHER DAY) 135 tablet 3   No current facility-administered medications for this visit.     Past Medical History:  Diagnosis Date   Acute systolic congestive heart failure (HCC) 03/01/2014   Allergy    Anemia    Arthritis    Cardiomyopathy (HCC)    Cervical myelopathy (HCC)    CHF (congestive heart failure) (HCC)    GERD (gastroesophageal reflux disease)    Heart disease    Hyperlipidemia    Hypertension    Leg pain    Overactive bladder     Past Surgical History:  Procedure Laterality Date   ANTERIOR CERVICAL DECOMP/DISCECTOMY FUSION N/A 08/03/2016   Procedure: ANTERIOR CERVICAL DECOMPRESSION/DISCECTOMY FUSION C4-5, C5-6;  Surgeon: Barnett Abu, MD;  Location: [facility] OR;  Service: Neurosurgery;  Laterality: N/A;   COLONOSCOPY     COLONOSCOPY WITH PROPOFOL N/A 02/24/2019   Procedure: COLONOSCOPY WITH PROPOFOL;  Surgeon: Rachael Fee, MD;  Location: [facility] ENDOSCOPY;  Service: Endoscopy;  Laterality: N/A;   KNEE ARTHROSCOPY     2007   ROTATOR CUFF REPAIR Right 03/23/13   TOTAL KNEE ARTHROPLASTY  09/22/2011   Procedure: TOTAL KNEE ARTHROPLASTY;  Surgeon: Raymon Mutton, MD;  Location: [facility] OR;  Service: Orthopedics;  Laterality:  Right;    Social History   Socioeconomic History   Marital status: Married    Spouse name: Not on file   Number of children: 2   Years of education: HS   Highest education level: Not on file  Occupational History   Occupation: Assembly Art therapist: [employer]  Tobacco Use   Smoking status: Never Smoker   Smokeless tobacco: Never Used  Substance and Sexual Activity   Alcohol use: Not Currently    Alcohol/week: 2.0 standard drinks    Types: 2 Glasses of wine per week   Drug use: No   Sexual activity: Not on file  Other Topics Concern   Not on  file  Social History Narrative   Married- 32 years   2 children- grown daughter- lives in [place]- has 2 children   Grown son lives in [place]- 4 children   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   1-2 cups caffeine daily.   Lives at home with her husband.   Social Determinants of Health   Financial Resource Strain:    Difficulty of Paying Living Expenses: Not on file  Food Insecurity:    Worried About Programme researcher, broadcasting/film/video in the Last Year: Not on file   [employer] of Food in the Last Year: Not on file  Transportation Needs:    Lack of Transportation (Medical): Not on file   Lack of Transportation (Non-Medical): Not on file  Physical Activity:    Days of Exercise per Week: Not on file   Minutes of Exercise per Session: Not on file  Stress:    Feeling of Stress : Not on file  Social Connections:    Frequency of Communication with Friends and Family: Not on file   Frequency of Social Gatherings with Friends and Family: Not on file   Attends Religious Services: Not on file   Active Member of Clubs or Organizations: Not on file   Attends Banker Meetings: Not on file   Marital Status: Not on file  Intimate Partner Violence:    Fear of Current or Ex-Partner: Not on file   Emotionally Abused: Not on file   Physically Abused: Not on file   Sexually Abused: Not on file    Family History  Problem Relation Age of Onset   Cancer Mother        colon 33 and breast 38- deceased   Colon cancer Mother    Diabetes Father        living   Asthma Father    Hypertension Father    Heart attack Father 65       medical management per pt   Glaucoma Father        had eye transplant   Kidney failure Father        acute renal failure- died 69   Esophageal cancer Neg Hx    Stomach cancer Neg Hx    Rectal cancer Neg Hx     ROS: no fevers or  chills, productive cough, hemoptysis, dysphasia, odynophagia, melena, hematochezia, dysuria, hematuria, rash, seizure activity, orthopnea, PND, pedal edema, claudication. Remaining systems are negative.  Physical Exam: Well-developed morbidly obese in no acute distress.  Skin is warm and dry.  HEENT is normal.  Neck is supple.  Chest is clear to auscultation with normal expansion.  Cardiovascular exam is regular rate  and rhythm.  2/6 systolic ejection murmur Abdominal exam nontender or distended. No masses palpated. Extremities show 1+ edema. neuro grossly intact  ECG-normal sinus rhythm at a rate of 82, left bundle branch block.  Personally reviewed  A/P  1 nonischemic cardiomyopathy-I would like to retry Entresto to see if patient will tolerate.  Discontinue losartan and treat with Entresto 24/26 twice daily. Continue carvedilol.  Add spironolactone 25 mg daily.  Check potassium and renal function in 1 week.  Titrate medications as tolerated.  Once fully titrated repeat echocardiogram.  If ejection fraction less than 35% would consider CRT-D.    2 chronic systolic congestive heart failure-patient complains of increasing dyspnea on exertion.  Exam is difficult.  Increase Demadex to 40 mg daily alternating with 20 mg.  We discussed fluid restriction and low-sodium diet.  Check potassium and renal function in 1 week.  3 hypertension-blood pressure controlled.  Cardiac medication adjustments as outlined above.  4 obstructive sleep apnea-by Dr. Maple Hudson.  She will need follow-up.  5 morbid obesity-we discussed the importance of diet, exercise and weight loss.  Olga Millers, MD

## 2020-04-24 DIAGNOSIS — I10 Essential (primary) hypertension: Secondary | ICD-10-CM | POA: Diagnosis not present

## 2020-04-25 DIAGNOSIS — M79602 Pain in left arm: Secondary | ICD-10-CM | POA: Diagnosis not present

## 2020-04-25 DIAGNOSIS — R51 Headache with orthostatic component, not elsewhere classified: Secondary | ICD-10-CM | POA: Diagnosis not present

## 2020-04-25 DIAGNOSIS — G894 Chronic pain syndrome: Secondary | ICD-10-CM | POA: Diagnosis not present

## 2020-04-25 DIAGNOSIS — M1990 Unspecified osteoarthritis, unspecified site: Secondary | ICD-10-CM | POA: Diagnosis not present

## 2020-04-25 DIAGNOSIS — E785 Hyperlipidemia, unspecified: Secondary | ICD-10-CM | POA: Diagnosis not present

## 2020-04-25 DIAGNOSIS — M545 Low back pain, unspecified: Secondary | ICD-10-CM | POA: Diagnosis not present

## 2020-04-25 DIAGNOSIS — I110 Hypertensive heart disease with heart failure: Secondary | ICD-10-CM | POA: Diagnosis not present

## 2020-04-25 DIAGNOSIS — I429 Cardiomyopathy, unspecified: Secondary | ICD-10-CM | POA: Diagnosis not present

## 2020-04-25 DIAGNOSIS — M48062 Spinal stenosis, lumbar region with neurogenic claudication: Secondary | ICD-10-CM | POA: Diagnosis not present

## 2020-04-25 DIAGNOSIS — M5000 Cervical disc disorder with myelopathy, unspecified cervical region: Secondary | ICD-10-CM | POA: Diagnosis not present

## 2020-04-25 DIAGNOSIS — K219 Gastro-esophageal reflux disease without esophagitis: Secondary | ICD-10-CM | POA: Diagnosis not present

## 2020-04-25 DIAGNOSIS — Z993 Dependence on wheelchair: Secondary | ICD-10-CM | POA: Diagnosis not present

## 2020-04-25 DIAGNOSIS — I5020 Unspecified systolic (congestive) heart failure: Secondary | ICD-10-CM | POA: Diagnosis not present

## 2020-04-25 DIAGNOSIS — D649 Anemia, unspecified: Secondary | ICD-10-CM | POA: Diagnosis not present

## 2020-04-25 LAB — BASIC METABOLIC PANEL
BUN/Creatinine Ratio: 16 (ref 12–28)
BUN: 17 mg/dL (ref 8–27)
CO2: 24 mmol/L (ref 20–29)
Calcium: 9.1 mg/dL (ref 8.7–10.3)
Chloride: 98 mmol/L (ref 96–106)
Creatinine, Ser: 1.05 mg/dL — ABNORMAL HIGH (ref 0.57–1.00)
GFR calc Af Amer: 66 mL/min/{1.73_m2} (ref >59)
GFR calc non Af Amer: 57 mL/min/{1.73_m2} — ABNORMAL LOW (ref >59)
Glucose: 93 mg/dL (ref 65–99)
Potassium: 4.9 mmol/L (ref 3.5–5.2)
Sodium: 144 mmol/L (ref 134–144)

## 2020-04-27 ENCOUNTER — Other Ambulatory Visit: Payer: Self-pay

## 2020-04-27 ENCOUNTER — Ambulatory Visit: Payer: Medicare Other | Admitting: Cardiology

## 2020-04-27 ENCOUNTER — Encounter: Payer: Self-pay | Admitting: Cardiology

## 2020-04-27 VITALS — BP 114/72 | HR 82 | Ht 68.0 in

## 2020-04-27 DIAGNOSIS — I5042 Chronic combined systolic (congestive) and diastolic (congestive) heart failure: Secondary | ICD-10-CM

## 2020-04-27 DIAGNOSIS — I10 Essential (primary) hypertension: Secondary | ICD-10-CM | POA: Diagnosis not present

## 2020-04-27 DIAGNOSIS — R601 Generalized edema: Secondary | ICD-10-CM

## 2020-04-27 DIAGNOSIS — I428 Other cardiomyopathies: Secondary | ICD-10-CM | POA: Diagnosis not present

## 2020-04-27 DIAGNOSIS — R6889 Other general symptoms and signs: Secondary | ICD-10-CM | POA: Diagnosis not present

## 2020-04-27 MED ORDER — TORSEMIDE 20 MG PO TABS: ORAL_TABLET | ORAL | 3 refills | Status: DC

## 2020-04-27 MED ORDER — ENTRESTO 24-26 MG PO TABS: 1.0000 | ORAL_TABLET | Freq: Two times a day (BID) | ORAL | 6 refills | Status: DC

## 2020-04-27 MED ORDER — SPIRONOLACTONE 25 MG PO TABS: 25.0000 mg | ORAL_TABLET | Freq: Every day | ORAL | 3 refills | Status: DC

## 2020-04-27 MED ORDER — LOSARTAN POTASSIUM 50 MG PO TABS: 50.0000 mg | ORAL_TABLET | Freq: Every day | ORAL | 1 refills | Status: DC

## 2020-04-27 NOTE — Patient Instructions (Signed)
Medication Instructions:   STOP LOSARTAN  START SPIRONOLACTONE 25 MG ONCE DAILY  START ENTRESTO 24/26 MG ONE TABLET TWICE DAILY  INCREASE TORSEMIDE TO 40 MG ALTERNATING WITH 20 MG ONCE DAILY  *If you need a refill on your cardiac medications before your next appointment, please call your pharmacy*   Lab Work:  Your physician recommends that you return for lab work in: ONE WEEK  If you have labs (blood work) drawn today and your tests are completely normal, you will receive your results only by: Marland Kitchen MyChart Message (if you have MyChart) OR . A paper copy in the mail If you have any lab test that is abnormal or we need to change your treatment, we will call you to review the results   Follow-Up: At [facility], you and your health needs are our priority.  As part of our continuing mission to provide you with exceptional heart care, we have created designated Provider Care Teams.  These Care Teams include your primary Cardiologist (physician) and Advanced Practice Providers (APPs -  Physician Assistants and Nurse Practitioners) who all work together to provide you with the care you need, when you need it.  We recommend signing up for the patient portal called "MyChart".  Sign up information is provided on this After Visit Summary.  MyChart is used to connect with patients for Virtual Visits (Telemedicine).  Patients are able to view lab/test results, encounter notes, upcoming appointments, etc.  Non-urgent messages can be sent to your provider as well.   To learn more about what you can do with MyChart, go to [email or web address].    Your next appointment:   2-4 week(s)  The format for your next appointment:   In Person  Provider:   You will see one of the following Advanced Practice Providers on your designated Care Team:    Corine Shelter, PA-C  [place], [place]  Edd Fabian, [place]  Then, Olga Millers, MD will plan to see you again in 3 month(s).

## 2020-05-01 ENCOUNTER — Telehealth (INDEPENDENT_AMBULATORY_CARE_PROVIDER_SITE_OTHER): Payer: Medicare Other | Admitting: Family

## 2020-05-01 ENCOUNTER — Other Ambulatory Visit: Payer: Self-pay

## 2020-05-01 DIAGNOSIS — L304 Erythema intertrigo: Secondary | ICD-10-CM

## 2020-05-01 DIAGNOSIS — L0390 Cellulitis, unspecified: Secondary | ICD-10-CM | POA: Diagnosis not present

## 2020-05-01 MED ORDER — FLUCONAZOLE 150 MG PO TABS
150.0000 mg | ORAL_TABLET | Freq: Once | ORAL | 0 refills | Status: DC
Start: 2020-05-01 — End: 2020-05-01

## 2020-05-01 MED ORDER — FLUCONAZOLE 150 MG PO TABS: ORAL_TABLET | ORAL | 0 refills | Status: DC

## 2020-05-01 MED ORDER — CEPHALEXIN 500 MG PO CAPS
500.0000 mg | ORAL_CAPSULE | Freq: Three times a day (TID) | ORAL | 0 refills | Status: DC
Start: 2020-05-01 — End: 2020-05-24

## 2020-05-01 NOTE — Progress Notes (Signed)
Virtual Visit via Video Note  I connected with Briana Miller on 05/01/20 at  4:40 PM EST by a video enabled telemedicine application and verified that I am speaking with the correct person using two identifiers.  Location: Patient: home Provider: work   I discussed the limitations of evaluation and management by telemedicine and the availability of in person appointments. The patient expressed understanding and agreed to proceed. Only the patient and myself were present for today's video call.   History of Present Illness:  Patient reports rash beneath her breasts.  Has been present x 2 weeks.  Notes "really bad funky smell."    Observations/Objective:   Gen: Awake, alert, no acute distress Resp: Breathing is even and non-labored Psych: calm/pleasant demeanor Neuro: Alert and Oriented x 3, + facial symmetry, speech is clear. Skin: pt was unable to show me her rash due to being in wheelchair  Assessment and Plan:  Intertrigo- likely intertrigo with superimposed cellulitis. Recommended that she begin diflucan PO once weekly x 2, gently dry beneath breasts with hairdryer after shower and apply nystatin powder bid. Will rx with keflex to cover for cellulitis. Pt is advised to follow up in office if symptoms worsen or if symptoms are not improved in 1 week. Recommended that she get a covid booster and a flu shot as well. Follow Up Instructions:    I discussed the assessment and treatment plan with the patient. The patient was provided an opportunity to ask questions and all were answered. The patient agreed with the plan and demonstrated an understanding of the instructions.   The patient was advised to call back or seek an in-person evaluation if the symptoms worsen or if the condition fails to improve as anticipated.  Lemont Fillers, NP

## 2020-05-11 ENCOUNTER — Ambulatory Visit: Payer: Medicare Other | Admitting: Internal Medicine

## 2020-05-15 ENCOUNTER — Telehealth: Payer: Self-pay | Admitting: Cardiology

## 2020-05-15 NOTE — Telephone Encounter (Signed)
Spoke to patient . She states she receive  A prescription of Losartan 50 mg from mail order. She wanted to know if she needed to take medication. RN asked if patient's medication was on automatic refill. She said she think so.  RN reviewed her chart on 05/06/20 Dr Jens Som stopped losartan , started [facility] 24/26 twice daily. Patient states she has been taking the Entresto. RN informed patient not take the losartan.  It has a similar medication already in th [place].  Contact mail order and have auto refill stopped.  Patient voiced understanding.

## 2020-05-15 NOTE — Telephone Encounter (Signed)
Pt c/o medication issue:  1. Name of Medication: Losartan 50 MG's  2. How are you currently taking this medication (dosage and times per day)? 1 tablet by mouth daily   3. Are you having a reaction (difficulty breathing--STAT)? No   4. What is your medication issue? Briana Miller is calling stating she has been taking 50 MG's of Losartan daily since 05/12/20. Prior to that date she was taking 100 MG's daily. She is requesting a callback to explain the dosage change. Please advise.

## 2020-05-20 DIAGNOSIS — G8220 Paraplegia, unspecified: Secondary | ICD-10-CM | POA: Diagnosis not present

## 2020-05-20 DIAGNOSIS — G959 Disease of spinal cord, unspecified: Secondary | ICD-10-CM | POA: Diagnosis not present

## 2020-05-22 ENCOUNTER — Telehealth: Payer: Self-pay | Admitting: Cardiology

## 2020-05-22 ENCOUNTER — Ambulatory Visit: Payer: Medicare Other | Admitting: Internal Medicine

## 2020-05-22 NOTE — Telephone Encounter (Signed)
Spoke with pt, appointment changed to virtual visit clinic day.

## 2020-05-22 NOTE — Telephone Encounter (Signed)
Patient called to see if she can get video visit on the 10th. Please call back

## 2020-05-23 ENCOUNTER — Other Ambulatory Visit: Payer: Self-pay | Admitting: Neurology

## 2020-05-24 ENCOUNTER — Telehealth (INDEPENDENT_AMBULATORY_CARE_PROVIDER_SITE_OTHER): Payer: Medicare Other | Admitting: Cardiology

## 2020-05-24 ENCOUNTER — Encounter: Payer: Self-pay | Admitting: Cardiology

## 2020-05-24 ENCOUNTER — Telehealth: Payer: Self-pay | Admitting: *Deleted

## 2020-05-24 VITALS — BP 111/54 | HR 105 | Ht 66.0 in

## 2020-05-24 DIAGNOSIS — I10 Essential (primary) hypertension: Secondary | ICD-10-CM | POA: Diagnosis not present

## 2020-05-24 DIAGNOSIS — I428 Other cardiomyopathies: Secondary | ICD-10-CM

## 2020-05-24 DIAGNOSIS — I447 Left bundle-branch block, unspecified: Secondary | ICD-10-CM

## 2020-05-24 DIAGNOSIS — G4733 Obstructive sleep apnea (adult) (pediatric): Secondary | ICD-10-CM | POA: Diagnosis not present

## 2020-05-24 NOTE — Patient Instructions (Addendum)
Medication Instructions:  RESUME CARVEDILOL 12.5 MG TWICE A DAY   CONTINUE ENTRESTO   STOP POTASSIUM    *If you need a refill on your cardiac medications before your next appointment, please call your pharmacy*  Lab Work: BMET IN NEXT FEW DAY  If you have labs (blood work) drawn today and your tests are completely normal, you will receive your results only by: Marland Kitchen MyChart Message (if you have MyChart) OR . A paper copy in the mail If you have any lab test that is abnormal or we need to change your treatment, we will call you to review the results.  Testing/Procedures: NONE   Follow-Up: At [facility], you and your health needs are our priority.  As part of our continuing mission to provide you with exceptional heart care, we have created designated Provider Care Teams.  These Care Teams include your primary Cardiologist (physician) and Advanced Practice Providers (APPs -  Physician Assistants and Nurse Practitioners) who all work together to provide you with the care you need, when you need it.  We recommend signing up for the patient portal called "MyChart".  Sign up information is provided on this After Visit Summary.  MyChart is used to connect with patients for Virtual Visits (Telemedicine).  Patients are able to view lab/test results, encounter notes, upcoming appointments, etc.  Non-urgent messages can be sent to your provider as well.   To learn more about what you can do with MyChart, go to [email or web address].    Your next appointment:   KEEP 07/27/2020 AT 2:00 PM VISIT WITH DR Jens Som

## 2020-05-24 NOTE — Progress Notes (Signed)
Virtual Visit via Telephone Note   This visit type was conducted due to national recommendations for restrictions regarding the COVID-19 Pandemic (e.g. social distancing) in an effort to limit this patient's exposure and mitigate transmission in our community.  Due to her co-morbid illnesses, this patient is at least at moderate risk for complications without adequate follow up.  This format is felt to be most appropriate for this patient at this time.  The patient did not have access to video technology/had technical difficulties with video requiring transitioning to audio format only (telephone).  All issues noted in this document were discussed and addressed.  No physical exam could be performed with this format.  Please refer to the patient's chart for her  consent to telehealth for [facility].    Date:  05/24/2020   ID:  Briana Miller, DOB Jan 06, 1956, MRN [id number] The patient was identified using 2 identifiers.  Patient Location: Home Provider Location: Home Office  PCP:  Sandford Craze, NP  Cardiologist:  Olga Millers, MD  Electrophysiologist:  None   Evaluation Performed:  Follow-Up Visit  Chief Complaint:  Dry mouth with Entresto  History of Present Illness:    Briana Miller is a 64 y.o. female with hx of hypertension, morbid obesity, sleep apnea, DJD, and nonischemic cardiomyopathy. Echocardiogram in 2014showed an ejection fraction of 40-45%. She had a negative nuclear stress in 2015. She has had some issues with dyspnea on exertion.She hasbeen sedentary secondary to osteoarthritis of her knees. She was seen for pre op TKR in May 2021. A Myoview was ordered but never done. She tells me she never did have her knee surgery, she is getting home PT.  Echo done July 2021 showed an EF of 30-35%. Coronary CTA 01/20/2020 was negative for CAD and we feel she has a NICM.   I spoke with her in a virtual visit 02/14/2020.  I suggested she stop Losartan 100  mg daily and start Entresto  49/51 BID.  She was to have a f/u BMP but this was never done.  She was seen in the office 03/09/2020 for follow up.  She told me the Sherryll Burger was making her mouth "dry" and she has been drinking a lot of water.  She also thought she was supposed to stop her Metoprolol. At that visit I changed her Entresto back to Losartan 50 mg and suggested she start Coreg 12.5 mg BID in place of Metoprolol.   She was seen by Dr Jens Som 04/27/2020 in the office.  He suggested she resume the Entresto at a lower dose and stop the losartan.  He also added Spironolactone 25 mg. The plan was to maximize medical Rx and check f/u echo in a few months.  The patient was contacted today for follow up.  She again reports the Sherryll Burger is causing her to have a dry mouth.  She also thought Dr Jens Som had wanted her to stop her Coreg.  She never did have f/u labs drawn- last BMP was 04/24/2020- K+ was 4.9 then. Besides "dry mouth" she otherwise denies any unusual dyspnea, she is not very active secondary to DJD in her knees.    The patient does not have symptoms concerning for COVID-19 infection (fever, chills, cough, or new shortness of breath).    Past Medical History:  Diagnosis Date  . Acute systolic congestive heart failure (HCC) 03/01/2014  . Allergy   . Anemia   . Arthritis   . Cardiomyopathy (HCC)   . Cervical myelopathy (  HCC)   . CHF (congestive heart failure) (HCC)   . GERD (gastroesophageal reflux disease)   . Heart disease   . Hyperlipidemia   . Hypertension   . Leg pain   . Overactive bladder    Past Surgical History:  Procedure Laterality Date  . ANTERIOR CERVICAL DECOMP/DISCECTOMY FUSION N/A 08/03/2016   Procedure: ANTERIOR CERVICAL DECOMPRESSION/DISCECTOMY FUSION C4-5, C5-6;  Surgeon: Barnett Abu, MD;  Location: [facility] OR;  Service: Neurosurgery;  Laterality: N/A;  . COLONOSCOPY    . COLONOSCOPY WITH PROPOFOL N/A 02/24/2019   Procedure: COLONOSCOPY WITH PROPOFOL;  Surgeon:  Rachael Fee, MD;  Location: [facility]COPY;  Service: Endoscopy;  Laterality: N/A;  . KNEE ARTHROSCOPY     2007  . ROTATOR CUFF REPAIR Right 03/23/13  . TOTAL KNEE ARTHROPLASTY  09/22/2011   Procedure: TOTAL KNEE ARTHROPLASTY;  Surgeon: Raymon Mutton, MD;  Location: [facility] OR;  Service: Orthopedics;  Laterality: Right;     Current Meds  Medication Sig  . Cholecalciferol (VITAMIN D3 PO) Take 1 tablet by mouth daily.   Marland Kitchen gabapentin (NEURONTIN) 300 MG capsule Take 3 capsules (900 mg total) by mouth 3 (three) times daily.  Marland Kitchen latanoprost (XALATAN) 0.005 % ophthalmic solution Place 1 drop into both eyes at bedtime.   . Melatonin 3 MG TABS Take 1 tablet by mouth as needed.   . nortriptyline (PAMELOR) 25 MG capsule TAKE 2 CAPSULES BY MOUTH AT BEDTIME.  Marland Kitchen NYAMYC powder APPLY TO AFFECTED AREA(S)  TOPICALLY TWICE DAILY  . pantoprazole (PROTONIX) 40 MG tablet TAKE 1 TABLET BY MOUTH  TWICE DAILY  . potassium chloride SA (KLOR-CON) 20 MEQ tablet Take 1 tablet (20 mEq total) by mouth daily.  . sacubitril-valsartan (ENTRESTO) 24-26 MG Take 1 tablet by mouth 2 (two) times daily.  Marland Kitchen spironolactone (ALDACTONE) 25 MG tablet Take 1 tablet (25 mg total) by mouth daily.  . timolol (BETIMOL) 0.5 % ophthalmic solution Place 1 drop into both eyes every morning.   . torsemide (DEMADEX) 20 MG tablet Take two tablets alternating with one tablet every other day     Allergies:   Entresto [sacubitril-valsartan]   Social History   Tobacco Use  . Smoking status: Never Smoker  . Smokeless tobacco: Never Used  Substance Use Topics  . Alcohol use: Not Currently    Alcohol/week: 2.0 standard drinks    Types: 2 Glasses of wine per week  . Drug use: No     Family Hx: The patient's family history includes Asthma in her father; Cancer in her mother; Colon cancer in her mother; Diabetes in her father; Glaucoma in her father; Heart attack (age of onset: 61) in her father; Hypertension in her father; Kidney failure in her  father. There is no history of Esophageal cancer, Stomach cancer, or Rectal cancer.  ROS:   Please see the history of present illness.    All other systems reviewed and are negative.   Prior CV studies:   The following studies were reviewed today:  Echo 03/30/2020- IMPRESSIONS    1. Left ventricular ejection fraction, by estimation, is 30 to 35%. The  left ventricle has moderately decreased function. The left ventricle  demonstrates global hypokinesis. The left ventricular internal cavity size  was mildly dilated. Left ventricular  diastolic parameters are consistent with Grade I diastolic dysfunction  (impaired relaxation).  2. Right ventricular systolic function is normal. The right ventricular  size is normal. There is normal pulmonary artery systolic pressure. The  estimated right ventricular  systolic pressure is 26.0 mmHg.  3. Left atrial size was moderately dilated.  4. The mitral valve was not well visualized. Mild mitral valve  regurgitation.  5. The aortic valve is normal in structure. Aortic valve regurgitation is  not visualized.  6. The inferior vena cava is normal in size with greater than 50%  respiratory variability, suggesting right atrial pressure of 3 mmHg.    Labs/Other Tests and Data Reviewed:    EKG:  An ECG dated 04/27/2020 was personally reviewed today and demonstrated:  NSR, 1st AVB, LBBB-HR 80  Recent Labs: 04/24/2020: BUN 17; Creatinine, Ser 1.05; Potassium 4.9; Sodium 144   Recent Lipid Panel Lab Results  Component Value Date/Time   CHOL 170 12/02/2019 02:34 PM   TRIG 140 12/02/2019 02:34 PM   HDL 49 (L) 12/02/2019 02:34 PM   CHOLHDL 3.5 12/02/2019 02:34 PM   LDLCALC 97 12/02/2019 02:34 PM    Wt Readings from Last 3 Encounters:  04/12/20 220 lb (99.8 kg)  02/02/20 240 lb (108.9 kg)  01/09/20 (!) 220 lb (99.8 kg)     Risk Assessment/Calculations:      Objective:    Vital Signs:  BP (!) 111/54   Pulse (!) 105   Ht 5\' 6"   (1.676 m)   LMP 06/17/2007   BMI 35.51 kg/m    VITAL SIGNS:  reviewed  ASSESSMENT & PLAN:    NICM (nonischemic cardiomyopathy) (HCC) Most recent EF 30-35%- echo 03/30/2020 Coronary CTA negative for CAD Aug 2021 She does complain of "dry mouth" on Entresto but I have urged her to continue this for now. I don't think we can titrate this further based on side  Effects and B/P.    She stopped her Coreg- HR 110.  I told her to resume this.  Stop K+ supplement since Aldactone added and check BMP.  Morbid obesity due to excess calories (HCC) Last BMI 38 but the patient declines weight secondary to her knee DJD- her weight may be higher than 240 lbs  Obstructive sleep apnea She has seen dr Sep 2021 and sleep study has again been recomended  HTN (hypertension) B/P 110/54- Coreg 12.5 mg BID to be resumed  LBBB (left bundle branch block) I'm not sure she would be a candidate for a BiV device. She will need a f/u echo once we can get on the correct medical Rx.   Plan:  Continue Entresto  Resume Coreg 12.5 mg BID Stop K+ Check BMP  The patient has had several medication changes in the last few months.  She has had trouble getting these correct.  I do not think there is room to increase her Entresto further at this point.  I have asked her to resume her carvedilol.  She will need a BM P.  She will need to follow-up in the office in a couple months and at that time if she is on the correct medications and doing well we can order an echo.   Shared Decision Making/Informed Consent        COVID-19 Education: The signs and symptoms of COVID-19 were discussed with the patient and how to seek care for testing (follow up with PCP or arrange E-visit).  The importance of social distancing was discussed today.  Time:   Today, I have spent 20 minutes with the patient with telehealth technology discussing the above problems.     Medication Adjustments/Labs and Tests Ordered: Current  medicines are reviewed at length with the patient today.  Concerns regarding  medicines are outlined above.   Tests Ordered: No orders of the defined types were placed in this encounter.   Medication Changes: No orders of the defined types were placed in this encounter.   Follow Up:  In Person Dr Jens Som -and only Dr Jens Som- in 6-8 weeks.  Signed, Corine Shelter, PA-C  05/24/2020 8:25 AM    [facility]

## 2020-05-24 NOTE — Telephone Encounter (Signed)
[name], beckstrom are scheduled for a virtual visit with your provider today.    Just as we do with appointments in the office, we must obtain your consent to participate.  Your consent will be active for this visit    If you have a MyChart account, I can also send a copy of this consent to you electronically.  All virtual visits are billed to your insurance company just like a traditional visit in the office.  As this is a virtual visit, video technology does not allow for your provider to perform a traditional examination.  This may limit your provider's ability to fully assess your condition.  If your provider identifies any concerns that need to be evaluated in person or the need to arrange testing such as labs, EKG, etc, we will make arrangements to do so.    Although advances in technology are sophisticated, we cannot ensure that it will always work on either your end or our end.  If the connection with a video visit is poor, we may have to switch to a telephone visit.  With either a video or telephone visit, we are not always able to ensure that we have a secure connection.   I need to obtain your verbal consent now.   Are you willing to proceed with your visit today?   Briana Miller has provided verbal consent on 05/24/2020 for a virtual visit (video or telephone).   Regis Bill, LPN [id number]/12/6732  19:37 AM

## 2020-05-25 ENCOUNTER — Ambulatory Visit: Payer: Medicare Other | Admitting: Cardiology

## 2020-05-29 ENCOUNTER — Telehealth (INDEPENDENT_AMBULATORY_CARE_PROVIDER_SITE_OTHER): Payer: Medicare Other | Admitting: Family

## 2020-05-29 ENCOUNTER — Other Ambulatory Visit: Payer: Self-pay

## 2020-05-29 ENCOUNTER — Encounter: Payer: Self-pay | Admitting: Family

## 2020-05-29 DIAGNOSIS — L539 Erythematous condition, unspecified: Secondary | ICD-10-CM | POA: Diagnosis not present

## 2020-05-29 NOTE — Progress Notes (Signed)
Virtual Visit via Telephone Note  I connected with Briana Miller on 05/29/20 at  5:40 PM EST by telephone and verified that I am speaking with the correct person using two identifiers.  Location: Patient: home Provider: work   I discussed the limitations, risks, security and privacy concerns of performing an evaluation and management service by telephone and the availability of in person appointments. I also discussed with the patient that there may be a patient responsible charge related to this service. The patient expressed understanding and agreed to proceed.   History of Present Illness:  Patient is a 64 yr old female who presents today with chief complaint of redness and pain on her her left lateral leg slightly below the knee cap and to the side of the knee which began Friday night. Today it is less red.  Reports that the other day "it was burning up." It is less tender to the touch.  No improvement with ice.   Observations/Objective:  Gen: speech is clear, breathing is even and non-labored Skin: viewed photo that patient sent and there appears to be some erythema of the skin on the left lateral leg.    Assessment and Plan:  Erythema- ? Improving cellulitis. Will rx with keflex 500mg  PO TID x 7 days.  See phone note.   Follow Up Instructions:    I discussed the assessment and treatment plan with the patient. The patient was provided an opportunity to ask questions and all were answered. The patient agreed with the plan and demonstrated an understanding of the instructions.   The patient was advised to call back or seek an in-person evaluation if the symptoms worsen or if the condition fails to improve as anticipated.  I provided 15 minutes of non-face-to-face time during this encounter.   , NP

## 2020-05-30 MED ORDER — CEPHALEXIN 500 MG PO CAPS
500.0000 mg | ORAL_CAPSULE | Freq: Three times a day (TID) | ORAL | 0 refills | Status: DC
Start: 2020-05-30 — End: 2020-06-29

## 2020-06-06 ENCOUNTER — Telehealth: Payer: Self-pay | Admitting: Family

## 2020-06-06 ENCOUNTER — Ambulatory Visit: Payer: Medicare Other | Admitting: Family Medicine

## 2020-06-06 NOTE — Telephone Encounter (Signed)
Message sent to Mychart

## 2020-06-06 NOTE — Telephone Encounter (Signed)
Patient states her left legs still hurt   Please advice

## 2020-06-06 NOTE — Telephone Encounter (Signed)
Please advise pt that I received a message from her insurance that she is using sumatriptan for migraines. I don't see it on her list, but if this is accurate, she should stop this medication due to her history of heart disease.

## 2020-06-06 NOTE — Telephone Encounter (Signed)
Spoke with pt, she says she is taking Nortriptyline instead of Imitrex.

## 2020-06-20 DIAGNOSIS — G8220 Paraplegia, unspecified: Secondary | ICD-10-CM | POA: Diagnosis not present

## 2020-06-20 DIAGNOSIS — G959 Disease of spinal cord, unspecified: Secondary | ICD-10-CM | POA: Diagnosis not present

## 2020-06-21 ENCOUNTER — Other Ambulatory Visit: Payer: Self-pay | Admitting: Neurology

## 2020-06-29 ENCOUNTER — Other Ambulatory Visit (HOSPITAL_BASED_OUTPATIENT_CLINIC_OR_DEPARTMENT_OTHER): Payer: Self-pay | Admitting: Internal Medicine

## 2020-06-29 ENCOUNTER — Ambulatory Visit: Payer: Medicare Other | Attending: Internal Medicine

## 2020-06-29 ENCOUNTER — Other Ambulatory Visit: Payer: Self-pay

## 2020-06-29 ENCOUNTER — Ambulatory Visit (INDEPENDENT_AMBULATORY_CARE_PROVIDER_SITE_OTHER): Payer: Medicare Other | Admitting: Family

## 2020-06-29 VITALS — BP 102/59 | HR 79 | Temp 97.7°F | Resp 18

## 2020-06-29 DIAGNOSIS — M5416 Radiculopathy, lumbar region: Secondary | ICD-10-CM

## 2020-06-29 DIAGNOSIS — R6889 Other general symptoms and signs: Secondary | ICD-10-CM | POA: Diagnosis not present

## 2020-06-29 DIAGNOSIS — L03116 Cellulitis of left lower limb: Secondary | ICD-10-CM

## 2020-06-29 DIAGNOSIS — Z23 Encounter for immunization: Secondary | ICD-10-CM

## 2020-06-29 MED ORDER — PREDNISONE 10 MG PO TABS
ORAL_TABLET | ORAL | 0 refills | Status: DC
Start: 2020-06-29 — End: 2020-07-16

## 2020-06-29 MED ORDER — CEPHALEXIN 500 MG PO CAPS: 500.0000 mg | ORAL_CAPSULE | Freq: Three times a day (TID) | ORAL | 0 refills | Status: DC

## 2020-06-29 MED ORDER — FLUCONAZOLE 150 MG PO TABS
ORAL_TABLET | ORAL | 0 refills | Status: DC
Start: 2020-06-29 — End: 2020-08-10

## 2020-06-29 MED ORDER — TRAMADOL HCL 50 MG PO TABS: 50.0000 mg | ORAL_TABLET | Freq: Three times a day (TID) | ORAL | 0 refills | Status: DC | PRN

## 2020-06-29 MED FILL — CEPHALEXIN 500 MG CAPSULE: 500 | 7 days supply | Qty: 21 | Fill #0

## 2020-06-29 MED FILL — traMADol HCL 50 MG TABS: 50 | 5 days supply | Qty: 15 | Fill #0

## 2020-06-29 MED FILL — predniSONE 10 MG TABS: 10 | 8 days supply | Qty: 20 | Fill #0

## 2020-06-29 MED FILL — FLUCONAZOLE 150 MG TABS: 150 | 4 days supply | Qty: 2 | Fill #0

## 2020-06-29 NOTE — Progress Notes (Signed)
   Covid-19 Vaccination Clinic  Name:  Briana Miller    MRN: [id number] DOB: May 07, 1956  06/29/2020  [name] was observed post Covid-19 immunization for 15 minutes without incident. She was provided with Vaccine Information Sheet and instruction to access the V-Safe system.   [name] was instructed to call 911 with any severe reactions post vaccine: Marland Kitchen Difficulty breathing  . Swelling of face and throat  . A fast heartbeat  . A bad rash all over body  . Dizziness and weakness   Immunizations Administered    Name Date Dose VIS Date Route   Pfizer COVID-19 Vaccine 06/29/2020 11:16 AM 0.3 mL 04/04/2020 Intramuscular   Manufacturer: [employer], [employer]   Lot: [id number]   NDC: [address]-9

## 2020-06-29 NOTE — Patient Instructions (Signed)
Please begin prednisone taper. You may use tramadol as needed for pain. You should be contacted about your referral back to Dr. Danielle Dess.   Please let me know if symptoms worsen or if symptoms do not improve in 1 week.

## 2020-06-29 NOTE — Progress Notes (Signed)
Subjective:    Patient ID: Briana Miller, female    DOB: [birth year], 65 y.o.   MRN: [id number]  HPI  Patient is a 65 yr old female who presents today with chief complaint of left sided leg pain, redness since December.  She reports that she initially had a warm hard area on the back of her leg that red and tender. This has improved following keflex back in December which was given at her virtual visit.   She c/o pain in the left anterior thigh which is sharp/shooting/electric in nature.  Review of Systems    see HPI  Past Medical History:  Diagnosis Date  . Acute systolic congestive heart failure (HCC) 03/01/2014  . Allergy   . Anemia   . Arthritis   . Cardiomyopathy (HCC)   . Cervical myelopathy (HCC)   . CHF (congestive heart failure) (HCC)   . GERD (gastroesophageal reflux disease)   . Heart disease   . Hyperlipidemia   . Hypertension   . Leg pain   . Overactive bladder      Social History   Socioeconomic History  . Marital status: Married    Spouse name: Not on file  . Number of children: 2  . Years of education: HS  . Highest education level: Not on file  Occupational History  . 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 Smoking status: Never Smoker  . Smokeless tobacco: Never Used  Substance and Sexual Activity  . Alcohol use: Not Currently    Alcohol/week: 2.0 standard drinks    Types: 2 Glasses of wine per week  . Drug use: No  . Sexual activity: Not on file  Other Topics Concern  . 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   1-2 cups caffeine daily.   Lives at home with her husband.   Social Determinants of Health   Financial Resource Strain: Not on  file  Food Insecurity: Not on file  Transportation Needs: Not on file  Physical Activity: Not on file  Stress: Not on file  Social Connections: Not on file  Intimate Partner Violence: Not on file    Past Surgical History:  Procedure Laterality Date  . ANTERIOR CERVICAL DECOMP/DISCECTOMY FUSION N/A 08/03/2016   Procedure: ANTERIOR CERVICAL DECOMPRESSION/DISCECTOMY FUSION C4-5, C5-6;  Surgeon: Barnett Abu, MD;  Location: [facility] OR;  Service: Neurosurgery;  Laterality: N/A;  . COLONOSCOPY    . COLONOSCOPY WITH PROPOFOL N/A 02/24/2019   Procedure: COLONOSCOPY WITH PROPOFOL;  Surgeon: Rachael Fee, MD;  Location: [facility];  Service: Endoscopy;  Laterality: N/A;  . KNEE ARTHROSCOPY     2007  . ROTATOR CUFF REPAIR Right 03/23/13  . TOTAL KNEE ARTHROPLASTY  09/22/2011   Procedure: TOTAL KNEE ARTHROPLASTY;  Surgeon: Raymon Mutton, MD;  Location: [facility] OR;  Service: Orthopedics;  Laterality: Right;    Family History  Problem Relation Age of Onset  . Cancer Mother        colon 62 and breast 93- deceased  . Colon cancer Mother   . Diabetes Father        living  . Asthma Father   .  Hypertension Father   . Heart attack Father 22       medical management per pt  . Glaucoma Father        had eye transplant  . Kidney failure Father        acute renal failure- died 8  . Esophageal cancer Neg Hx   . Stomach cancer Neg Hx   . Rectal cancer Neg Hx     Allergies  Allergen Reactions  . Entresto [Sacubitril-Valsartan]     "Dry mouth"    Current Outpatient Medications on File Prior to Visit  Medication Sig Dispense Refill  . carvedilol (COREG) 12.5 MG tablet Take 1 tablet (12.5 mg total) by mouth 2 (two) times daily. 180 tablet 4  . Cholecalciferol (VITAMIN D3 PO) Take 1 tablet by mouth daily.     Marland Kitchen gabapentin (NEURONTIN) 300 MG capsule Take 3 capsules (900 mg total) by mouth 3 (three) times daily. 810 capsule 0  . latanoprost (XALATAN) 0.005 % ophthalmic solution Place 1 drop into both  eyes at bedtime.   11  . Melatonin 3 MG TABS Take 1 tablet by mouth as needed.     . nortriptyline (PAMELOR) 25 MG capsule TAKE 2 CAPSULES BY MOUTH AT BEDTIME 60 capsule 0  . NYAMYC powder APPLY TO AFFECTED AREA(S)  TOPICALLY TWICE DAILY 180 g 1  . pantoprazole (PROTONIX) 40 MG tablet TAKE 1 TABLET BY MOUTH  TWICE DAILY 180 tablet 3  . sacubitril-valsartan (ENTRESTO) 24-26 MG Take 1 tablet by mouth 2 (two) times daily. 60 tablet 6  . spironolactone (ALDACTONE) 25 MG tablet Take 1 tablet (25 mg total) by mouth daily. 90 tablet 3  . timolol (BETIMOL) 0.5 % ophthalmic solution Place 1 drop into both eyes every morning.     . torsemide (DEMADEX) 20 MG tablet Take two tablets alternating with one tablet every other day 270 tablet 3   No current facility-administered medications on file prior to visit.    BP (!) 102/59 (BP Location: Left Arm, Patient Position: Sitting, Cuff Size: Large)   Pulse 79   Temp 97.7 F (36.5 C) (Oral)   Resp 18   LMP 06/17/2007   SpO2 97%    Objective:   Physical Exam Musculoskeletal:        General: Swelling (2+ bilateral LE) present.     Comments: Some adjustment  Left knee is contracted.  She is only partially able to extend the left knee She is noted to have some varicose veins in the left anterior thigh  Skin:    Comments:  the tissues are a bit boggy with some peau d'orange appearance inside the left knee and mild tenderness.  Neurological:     Mental Status: She is alert.           Assessment & Plan:  Lumbar radiculopathy- Describes an L4 distribution pattern of pain.  Review of MRI lumbar spine 2018 noted:  Lumbar spine shows prominent facet arthropathy, especially at L3-4 and L4-5. Moderate spinal stenosis at L4-5.  Will plan rx with a prednisone taper and refer back to her neurosurgeon for further evaluation.  Cellulitis- Will treat with additional round of keflex and she will be given diflucan to have on hand.   This visit occurred  during the SARS-CoV-2 public health emergency.  Safety protocols were in place, including screening questions prior to the visit, additional usage of staff PPE, and extensive cleaning of exam room while observing appropriate contact time as indicated for disinfecting solutions.

## 2020-07-03 MED FILL — PFIZER-BIONTECH COVID-19 VA: 30 | 1 days supply | Qty: 0 | Fill #0

## 2020-07-10 NOTE — Progress Notes (Deleted)
NEUROLOGY FOLLOW UP OFFICE NOTE  Briana Miller [id number]   Subjective:  Briana Miller is a 37 year oldleft-handed female with cardiomyopathy, CHF, HTN, HLD and history of cervical myelopathy status post ACDF who follows up for headache and extremity pain and weakness.  UPDATE: She was referred to physical therapy for her left arm pain and weakness and low back pain.  ***  Current NSAIDS:none Current analgesics:none Current anti-emetic:Zofran 4mg  Current muscle relaxants:none Current sleep aide:melatonin Current Antihypertensive medications:Toprol-XL 50mg ; losartan; torsemide Current Antidepressant medications:nortriptyline 10mg  at bedtimem. Current Anticonvulsant medications:Gabapentin 900mg  TID Current anti-CGRP:none Current Vitamins/Herbal/Supplements:Melatonin; KCl Current Antihistamines/Decongestants:Meclizine   HISTORY: She began experiencing positional headache and dizziness several months ago. It occurs with fast movements, mostly when laying down or getting up from bed or turning her head to the right. The dizziness is a spinning sensation that lasts 2 to 3 minutes. The headache is a severe frontal pressure-like headache that lasts 5 to 10 minutes. There is associated nausea and fuzzy vision but no vomiting, double vision, hearing loss, numbness or weakness. It initially occurred all the time but now only 2 to 3 times a week. When she turns her neck to the right, it feels like the right side of her neck "catches". She initially was treated for sinusitis and was then prescribed meclizine, which helped.   Headaches when move sudden, spinning several months. Move fast but mostly laying down or getting up from bed. 2 to 3 minutes. Was daily for a while and now 2 to 3 times a week. If turn sudden, feels like something in right side of neck catches. She also reports ringing in her ears.  MRI of brain with and without contrast  on 11/23/2019 personally reviewed and was unremarkable.  In 2018, she developed lower extremity weakness and loss of bowel and bladder control. She was found to have spinal stenosis due to degenerative discs at C4-5 and C5-6 causing myelopathy. She underwent ACDF. She has been in a wheelchair most of the time since the surgery.   It paricularly notes pain in the left arm, like her muscle is going to pop.  She notes numbness in the thumb and index finger.  She also has low back pain.  Every now and then, she notes sharp pain down the legs.  MRI of lumbar spine from 2018 did show spinal stenosis at L4-5.  MRI of cervical spine performed on 01/07/2020 showed mild spondylosis and ACDF C4-5 and C5-6 with possible residual cord signal at C4-5, although imaging limited by motion artifact.  No significant stenosis.     PAST MEDICAL HISTORY: Past Medical History:  Diagnosis Date  . Acute systolic congestive heart failure (HCC) 03/01/2014  . Allergy   . Anemia   . Arthritis   . Cardiomyopathy (HCC)   . Cervical myelopathy (HCC)   . CHF (congestive heart failure) (HCC)   . GERD (gastroesophageal reflux disease)   . Heart disease   . Hyperlipidemia   . Hypertension   . Leg pain   . Overactive bladder     MEDICATIONS: Current Outpatient Medications on File Prior to Visit  Medication Sig Dispense Refill  . carvedilol (COREG) 12.5 MG tablet Take 1 tablet (12.5 mg total) by mouth 2 (two) times daily. 180 tablet 4  . cephALEXin (KEFLEX) 500 MG capsule Take 1 capsule (500 mg total) by mouth 3 (three) times daily. 21 capsule 0  . Cholecalciferol (VITAMIN D3 PO) Take 1 tablet by mouth daily.     2019  fluconazole (DIFLUCAN) 150 MG tablet Take 1 tablet by mouth once as needed for yeast infection. May repeat in 3 days if needed 2 tablet 0  . gabapentin (NEURONTIN) 300 MG capsule Take 3 capsules (900 mg total) by mouth 3 (three) times daily. 810 capsule 0  . latanoprost (XALATAN) 0.005 % ophthalmic solution  Place 1 drop into both eyes at bedtime.   11  . Melatonin 3 MG TABS Take 1 tablet by mouth as needed.     . nortriptyline (PAMELOR) 25 MG capsule TAKE 2 CAPSULES BY MOUTH AT BEDTIME 60 capsule 0  . NYAMYC powder APPLY TO AFFECTED AREA(S)  TOPICALLY TWICE DAILY 180 g 1  . pantoprazole (PROTONIX) 40 MG tablet TAKE 1 TABLET BY MOUTH  TWICE DAILY 180 tablet 3  . predniSONE (DELTASONE) 10 MG tablet 4 tabs by mouth once daily for 2 days, then 3 tabs daily x 2 days, then 2 tabs daily x 2 days, then 1 tab daily x 2 days 20 tablet 0  . sacubitril-valsartan (ENTRESTO) 24-26 MG Take 1 tablet by mouth 2 (two) times daily. 60 tablet 6  . spironolactone (ALDACTONE) 25 MG tablet Take 1 tablet (25 mg total) by mouth daily. 90 tablet 3  . timolol (BETIMOL) 0.5 % ophthalmic solution Place 1 drop into both eyes every morning.     . torsemide (DEMADEX) 20 MG tablet Take two tablets alternating with one tablet every other day 270 tablet 3   No current facility-administered medications on file prior to visit.    ALLERGIES: Allergies  Allergen Reactions  . Entresto [Sacubitril-Valsartan]     "Dry mouth"    FAMILY HISTORY: Family History  Problem Relation Age of Onset  . Cancer Mother        colon 32 and breast 58- deceased  . Colon cancer Mother   . Diabetes Father        living  . Asthma Father   . Hypertension Father   . Heart attack Father 54       medical management per pt  . Glaucoma Father        had eye transplant  . Kidney failure Father        acute renal failure- died 71  . Esophageal cancer Neg Hx   . Stomach cancer Neg Hx   . Rectal cancer Neg Hx    ***.  SOCIAL HISTORY: Social History   Socioeconomic History  . Marital status: Married    Spouse name: Not on file  . Number of children: 2  . Years of education: HS  . Highest education level: Not on file  Occupational History  . 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 Smoking status: Never Smoker  .  Smokeless tobacco: Never Used  Substance and Sexual Activity  . Alcohol use: Not Currently    Alcohol/week: 2.0 standard drinks    Types: 2 Glasses of wine per week  . Drug use: No  . Sexual activity: Not on file  Other Topics Concern  . 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   1-2 cups caffeine daily.   Lives at home with her  husband.   Social Determinants of Health   Financial Resource Strain: Not on file  Food Insecurity: Not on file  Transportation Needs: Not on file  Physical Activity: Not on file  Stress: Not on file  Social Connections: Not on file  Intimate Partner Violence: Not on file     Objective:  *** General: No acute distress.  Patient appears well-groomed.   Head:  Normocephalic/atraumatic Eyes:  Fundi examined but not visualized Neck: supple, no paraspinal tenderness, full range of motion Heart:  Regular rate and rhythm Lungs:  Clear to auscultation bilaterally Back: No paraspinal tenderness Neurological Exam: alert and oriented to person, place, and time. Attention span and concentration intact, recent and remote memory intact, fund of knowledge intact.  Speech fluent and not dysarthric, language intact.  CN II-XII intact. Bulk and tone normal, muscle strength 5-//5 throughout ***.  Sensation to light touch, temperature and vibration intact.  Deep tendon reflexes 2+ throughout, right Babinski, left toes downgoing.  Finger to nose and heel to shin testing intact.  Gait deferred.  In wheelchair.   Assessment/Plan:   1.  Positional headache 2.  Benign paroxysmal positional vertigo 3.  History of cervical myelopathy with continued left arm pain and weakness.  Some residual chronic cervical  cord signal change but no evidence of recurrent spinal stenosis.  Mild left neural foraminal stenosis at C6-C7 which potentially may cause pain but definitely nothing warranting surgery. 3.  Lumbar spinal stenosis at L4-5 as demonstrated on prior MRI from 2018.  With chronic low back pain with occasional claudication.  1.  ***  Shon Millet, DO  CC: Sandford Craze, NP

## 2020-07-11 ENCOUNTER — Encounter: Payer: Self-pay | Admitting: Family

## 2020-07-12 ENCOUNTER — Ambulatory Visit: Payer: Medicare Other | Admitting: Neurology

## 2020-07-16 ENCOUNTER — Other Ambulatory Visit: Payer: Self-pay

## 2020-07-16 ENCOUNTER — Telehealth (INDEPENDENT_AMBULATORY_CARE_PROVIDER_SITE_OTHER): Payer: Medicare Other | Admitting: Family

## 2020-07-16 DIAGNOSIS — J329 Chronic sinusitis, unspecified: Secondary | ICD-10-CM | POA: Diagnosis not present

## 2020-07-16 DIAGNOSIS — L98491 Non-pressure chronic ulcer of skin of other sites limited to breakdown of skin: Secondary | ICD-10-CM | POA: Diagnosis not present

## 2020-07-16 DIAGNOSIS — R109 Unspecified abdominal pain: Secondary | ICD-10-CM | POA: Diagnosis not present

## 2020-07-16 MED ORDER — AMOXICILLIN 500 MG PO CAPS
500.0000 mg | ORAL_CAPSULE | Freq: Three times a day (TID) | ORAL | 0 refills | Status: DC
Start: 2020-07-16 — End: 2020-08-10

## 2020-07-16 NOTE — Patient Instructions (Signed)
Please add claritin or zyrtec once daily. Start amoxicillin 500mg  three times daily. Add flonase 2 sprays each nostril once daily for the next 1-2 weeks. Call if symptoms worsen or if not improved in 3-4 days.

## 2020-07-16 NOTE — Progress Notes (Signed)
Virtual Visit via Video Note  I connected with Rod Can on 07/16/20 at 11:40 AM EST by a video enabled telemedicine application and verified that I am speaking with the correct person using two identifiers.  Location: Patient: work Restaurant manager, fast food: work    I discussed the limitations of evaluation and management by telemedicine and the availability of in person appointments. The patient expressed understanding and agreed to proceed. Only the patient and myself were present for today's video call.   History of Present Illness:  Reports a post nasal drip.  Some mucous in chest she can cough up. Reports that nasal drip started some time last week.  She reports intermittent headache in the front of her head.  She denies cough, fever, loss of taste/smell, sore throat or body aches. Reports right frontal sinus tenderness to palpation.   She has been having some diarrhea, gi discomfort, anorexia- has follow up with GI.   She describes bilateral buttock sores.  Wonders what she should do for them.   Observations/Objective:   Gen: Awake, alert, no acute distress Resp: Breathing sounds even and non-labored Psych: calm/pleasant demeanor Neuro: Alert and Oriented x 3, + facial symmetry, speech is clear.   Assessment and Plan:  Sinusitis-we will treat with amoxicillin 500 mg p.o. 3 times daily for 10 days.  Also suggested that she add some Flonase 2 sprays each nostril once daily and either Claritin or Zyrtec once daily.  Abdominal discomfort-I advised her to keep her upcoming appointment with GI.  It appears that they are unable to see her for another month however.  I advised her should her symptoms become severe or worsen she should go to the ED, and if she would like to make an in person visit with me for further evaluation prior to her scheduled GI visit I am happy to see her back.  Skin ulcers-she describes that ulcers are on her bilateral buttocks where she sits and are less than an  inch in diameter.  Area is sore to the touch.  It sounds like she has bilateral pressure ulcers.  Will request home health RN to come do a skin assessment and hopefully get her set up with additional equipment such as gel overlay/wheelchair pad to help prevent further breakdown.  Follow Up Instructions:    I discussed the assessment and treatment plan with the patient. The patient was provided an opportunity to ask questions and all were answered. The patient agreed with the plan and demonstrated an understanding of the instructions.   The patient was advised to call back or seek an in-person evaluation if the symptoms worsen or if the condition fails to improve as anticipated.  Lemont Fillers, NP

## 2020-07-18 NOTE — Progress Notes (Signed)
HPI: FU cardiomyopathy. Echocardiogram in October of 2014 showed an ejection fraction of 40-45%, mild left ventricular hypertrophy and mild left atrial enlargement. Nuclear study in December of 2014 showed an ejection fraction of 51% and normal perfusion. Cardiac CTA August 2021 showed calcium score of 0 and no coronary disease.  Last echocardiogram October 2021 showed ejection fraction 30 to 35%, mild left ventricular enlargement, grade 1 diastolic dysfunction, moderate left atrial enlargement, mild mitral regurgitation. Since last seenshe denies dyspnea, chest pain, palpitations or syncope.  Current Outpatient Medications  Medication Sig Dispense Refill  . amoxicillin (AMOXIL) 500 MG capsule Take 1 capsule (500 mg total) by mouth 3 (three) times daily. 30 capsule 0  . Cholecalciferol (VITAMIN D3 PO) Take 1 tablet by mouth daily.     . fluconazole (DIFLUCAN) 150 MG tablet Take 1 tablet by mouth once as needed for yeast infection. May repeat in 3 days if needed 2 tablet 0  . gabapentin (NEURONTIN) 300 MG capsule Take 3 capsules (900 mg total) by mouth 3 (three) times daily. 810 capsule 0  . latanoprost (XALATAN) 0.005 % ophthalmic solution Place 1 drop into both eyes at bedtime.   11  . Melatonin 3 MG TABS Take 1 tablet by mouth as needed.     . nortriptyline (PAMELOR) 25 MG capsule TAKE 2 CAPSULES BY MOUTH AT BEDTIME 60 capsule 0  . NYAMYC powder APPLY TO AFFECTED AREA(S)  TOPICALLY TWICE DAILY 180 g 1  . pantoprazole (PROTONIX) 40 MG tablet TAKE 1 TABLET BY MOUTH  TWICE DAILY 180 tablet 3  . sacubitril-valsartan (ENTRESTO) 24-26 MG Take 1 tablet by mouth 2 (two) times daily. 60 tablet 6  . timolol (BETIMOL) 0.5 % ophthalmic solution Place 1 drop into both eyes every morning.     . torsemide (DEMADEX) 20 MG tablet Take two tablets alternating with one tablet every other day 270 tablet 3  . carvedilol (COREG) 12.5 MG tablet Take 1 tablet (12.5 mg total) by mouth 2 (two) times daily. 180  tablet 4  . spironolactone (ALDACTONE) 25 MG tablet Take 1 tablet (25 mg total) by mouth daily. 90 tablet 3   No current facility-administered medications for this visit.     Past Medical History:  Diagnosis Date  . Acute systolic congestive heart failure (HCC) 03/01/2014  . Allergy   . Anemia   . Arthritis   . Cardiomyopathy (HCC)   . Cervical myelopathy (HCC)   . CHF (congestive heart failure) (HCC)   . GERD (gastroesophageal reflux disease)   . Heart disease   . Hyperlipidemia   . Hypertension   . Leg pain   . Overactive bladder     Past Surgical History:  Procedure Laterality Date  . ANTERIOR CERVICAL DECOMP/DISCECTOMY FUSION N/A 08/03/2016   Procedure: ANTERIOR CERVICAL DECOMPRESSION/DISCECTOMY FUSION C4-5, C5-6;  Surgeon: Barnett Abu, MD;  Location: [facility] OR;  Service: Neurosurgery;  Laterality: N/A;  . COLONOSCOPY    . COLONOSCOPY WITH PROPOFOL N/A 02/24/2019   Procedure: COLONOSCOPY WITH PROPOFOL;  Surgeon: Rachael Fee, MD;  Location: [facility] END[facility]COPY;  Service: Endoscopy;  Laterality: N/A;  . KNEE ARTHROSCOPY     2007  . ROTATOR CUFF REPAIR Right 03/23/13  . TOTAL KNEE ARTHROPLASTY  09/22/2011   Procedure: TOTAL KNEE ARTHROPLASTY;  Surgeon: Raymon Mutton, MD;  Location: [facility] OR;  Service: Orthopedics;  Laterality: Right;    Social History   Socioeconomic History  . Marital status: Married    Spouse name:  Not on file  . Number of children: 2  . Years of education: HS  . Highest education level: Not on file  Occupational History  . 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 Smoking status: Never Smoker  . Smokeless tobacco: Never Used  Substance and Sexual Activity  . Alcohol use: Not Currently    Alcohol/week: 2.0 standard drinks    Types: 2 Glasses of wine per week  . Drug use: No  . Sexual activity: Not on file  Other Topics Concern  . 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   1-2 cups caffeine daily.   Lives at home with her husband.   Social Determinants of Health   Financial Resource Strain: Not on file  Food Insecurity: Not on file  Transportation Needs: Not on file  Physical Activity: Not on file  Stress: Not on file  Social Connections: Not on file  Intimate Partner Violence: Not on file    Family History  Problem Relation Age of Onset  . Cancer Mother        colon 52 and breast 52- deceased  . Colon cancer Mother   . Diabetes Father        living  . Asthma Father   . Hypertension Father   . Heart attack Father 67       medical management per pt  . Glaucoma Father        had eye transplant  . Kidney failure Father        acute renal failure- died 50  . Esophageal cancer Neg Hx   . Stomach cancer Neg Hx   . Rectal cancer Neg Hx     ROS: no fevers or chills, productive cough, hemoptysis, dysphasia, odynophagia, melena, hematochezia, dysuria, hematuria, rash, seizure activity, orthopnea, PND, pedal edema, claudication. Remaining systems are negative.  Physical Exam: Well-developed obese in no acute distress.  Skin is warm and dry.  HEENT is normal.  Neck is supple.  Chest is clear to auscultation with normal expansion.  Cardiovascular exam is regular rate and rhythm.  Abdominal exam nontender or distended. No masses palpated. Extremities show no edema. neuro grossly intact   A/P  1 nonischemic cardiomyopathy-plan to continue Entresto and carvedilol.  Continue spironolactone.  Check potassium and renal function.  Now that medications are fully titrated plan to repeat echocardiogram.  If ejection fraction less than 35% would consider CRT-D.  2 chronic systolic congestive heart failure-she appears to be doing reasonably  well from a symptomatic standpoint.  Continue Demadex at present dose.  Check potassium and renal function.  3 hypertension-patient's blood pressure is controlled today.  Continue present medical regimen.  4 morbid obesity-we discussed the importance of weight loss.  5 obstructive sleep apnea-followed by pulmonary.  6 preoperative evaluation prior to knee replacement-patient doing well at present and no history of coronary disease.  She may proceed without further testing.  Continue present medications pre and postoperatively.  Olga Millers, MD

## 2020-07-19 ENCOUNTER — Other Ambulatory Visit: Payer: Self-pay | Admitting: Neurology

## 2020-07-21 DIAGNOSIS — G959 Disease of spinal cord, unspecified: Secondary | ICD-10-CM | POA: Diagnosis not present

## 2020-07-21 DIAGNOSIS — G8220 Paraplegia, unspecified: Secondary | ICD-10-CM | POA: Diagnosis not present

## 2020-07-23 ENCOUNTER — Telehealth: Payer: Self-pay | Admitting: Family

## 2020-07-23 DIAGNOSIS — I429 Cardiomyopathy, unspecified: Secondary | ICD-10-CM | POA: Diagnosis not present

## 2020-07-23 DIAGNOSIS — L89312 Pressure ulcer of right buttock, stage 2: Secondary | ICD-10-CM | POA: Diagnosis not present

## 2020-07-23 DIAGNOSIS — I110 Hypertensive heart disease with heart failure: Secondary | ICD-10-CM | POA: Diagnosis not present

## 2020-07-23 DIAGNOSIS — M1990 Unspecified osteoarthritis, unspecified site: Secondary | ICD-10-CM | POA: Diagnosis not present

## 2020-07-23 DIAGNOSIS — I5020 Unspecified systolic (congestive) heart failure: Secondary | ICD-10-CM | POA: Diagnosis not present

## 2020-07-23 DIAGNOSIS — N3281 Overactive bladder: Secondary | ICD-10-CM | POA: Diagnosis not present

## 2020-07-23 DIAGNOSIS — M5000 Cervical disc disorder with myelopathy, unspecified cervical region: Secondary | ICD-10-CM | POA: Diagnosis not present

## 2020-07-23 DIAGNOSIS — I10 Essential (primary) hypertension: Secondary | ICD-10-CM | POA: Diagnosis not present

## 2020-07-23 DIAGNOSIS — D649 Anemia, unspecified: Secondary | ICD-10-CM | POA: Diagnosis not present

## 2020-07-23 DIAGNOSIS — E785 Hyperlipidemia, unspecified: Secondary | ICD-10-CM | POA: Diagnosis not present

## 2020-07-23 DIAGNOSIS — L89322 Pressure ulcer of left buttock, stage 2: Secondary | ICD-10-CM | POA: Diagnosis not present

## 2020-07-23 DIAGNOSIS — J329 Chronic sinusitis, unspecified: Secondary | ICD-10-CM | POA: Diagnosis not present

## 2020-07-23 DIAGNOSIS — K219 Gastro-esophageal reflux disease without esophagitis: Secondary | ICD-10-CM | POA: Diagnosis not present

## 2020-07-23 NOTE — Telephone Encounter (Signed)
Caller : [name] with [facility] home health Call Back @ [phone]  Briana Miller is requesting a verbal order for  Nurse Care frequency : 1x 8 Applying duoderm cream PT Eval and Treat

## 2020-07-24 NOTE — Telephone Encounter (Signed)
Verbal orders given to [facility] at [facility] as per provider note from last office visit to start home health care.

## 2020-07-26 DIAGNOSIS — H52203 Unspecified astigmatism, bilateral: Secondary | ICD-10-CM | POA: Diagnosis not present

## 2020-07-26 DIAGNOSIS — R6889 Other general symptoms and signs: Secondary | ICD-10-CM | POA: Diagnosis not present

## 2020-07-26 DIAGNOSIS — H2513 Age-related nuclear cataract, bilateral: Secondary | ICD-10-CM | POA: Diagnosis not present

## 2020-07-26 DIAGNOSIS — H401132 Primary open-angle glaucoma, bilateral, moderate stage: Secondary | ICD-10-CM | POA: Diagnosis not present

## 2020-07-26 DIAGNOSIS — L89322 Pressure ulcer of left buttock, stage 2: Secondary | ICD-10-CM | POA: Diagnosis not present

## 2020-07-26 DIAGNOSIS — H524 Presbyopia: Secondary | ICD-10-CM | POA: Diagnosis not present

## 2020-07-26 DIAGNOSIS — L89312 Pressure ulcer of right buttock, stage 2: Secondary | ICD-10-CM | POA: Diagnosis not present

## 2020-07-26 DIAGNOSIS — H5213 Myopia, bilateral: Secondary | ICD-10-CM | POA: Diagnosis not present

## 2020-07-26 NOTE — Telephone Encounter (Signed)
Caller: Deana or Stacie Call back # [phone]  They need to verify some of the diagnosis

## 2020-07-27 ENCOUNTER — Telehealth: Payer: Self-pay | Admitting: Family

## 2020-07-27 ENCOUNTER — Ambulatory Visit: Payer: Medicare Other | Admitting: Cardiology

## 2020-07-27 ENCOUNTER — Other Ambulatory Visit: Payer: Self-pay

## 2020-07-27 ENCOUNTER — Encounter: Payer: Self-pay | Admitting: Cardiology

## 2020-07-27 VITALS — BP 108/76 | HR 78 | Ht 66.0 in

## 2020-07-27 DIAGNOSIS — Z01810 Encounter for preprocedural cardiovascular examination: Secondary | ICD-10-CM | POA: Diagnosis not present

## 2020-07-27 DIAGNOSIS — I5042 Chronic combined systolic (congestive) and diastolic (congestive) heart failure: Secondary | ICD-10-CM | POA: Diagnosis not present

## 2020-07-27 DIAGNOSIS — I428 Other cardiomyopathies: Secondary | ICD-10-CM | POA: Diagnosis not present

## 2020-07-27 DIAGNOSIS — I10 Essential (primary) hypertension: Secondary | ICD-10-CM

## 2020-07-27 NOTE — Telephone Encounter (Signed)
Patient  miss several visit, they need evaluation next week

## 2020-07-27 NOTE — Patient Instructions (Signed)
  Lab Work:  Your physician recommends that you HAVE LAB WORK TODAY  If you have labs (blood work) drawn today and your tests are completely normal, you will receive your results only by: . MyChart Message (if you have MyChart) OR . A paper copy in the mail If you have any lab test that is abnormal or we need to change your treatment, we will call you to review the results.   Testing/Procedures:  Your physician has requested that you have an echocardiogram. Echocardiography is a painless test that uses sound waves to create images of your heart. It provides your doctor with information about the size and shape of your heart and how well your heart's chambers and valves are working. This procedure takes approximately one hour. There are no restrictions for this procedure.[address]     Follow-Up: At [facility], you and your health needs are our priority.  As part of our continuing mission to provide you with exceptional heart care, we have created designated Provider Care Teams.  These Care Teams include your primary Cardiologist (physician) and Advanced Practice Providers (APPs -  Physician Assistants and Nurse Practitioners) who all work together to provide you with the care you need, when you need it.  We recommend signing up for the patient portal called "MyChart".  Sign up information is provided on this After Visit Summary.  MyChart is used to connect with patients for Virtual Visits (Telemedicine).  Patients are able to view lab/test results, encounter notes, upcoming appointments, etc.  Non-urgent messages can be sent to your provider as well.   To learn more about what you can do with MyChart, go to [email or web address].    Your next appointment:   6 month(s)  The format for your next appointment:   In Person  Provider:   Brian Crenshaw, MD    

## 2020-07-28 LAB — BASIC METABOLIC PANEL
BUN/Creatinine Ratio: 20 (ref 12–28)
BUN: 25 mg/dL (ref 8–27)
CO2: 24 mmol/L (ref 20–29)
Calcium: 9.3 mg/dL (ref 8.7–10.3)
Chloride: 103 mmol/L (ref 96–106)
Creatinine, Ser: 1.27 mg/dL — ABNORMAL HIGH (ref 0.57–1.00)
GFR calc Af Amer: 52 mL/min/{1.73_m2} — ABNORMAL LOW (ref >59)
GFR calc non Af Amer: 45 mL/min/{1.73_m2} — ABNORMAL LOW (ref >59)
Glucose: 119 mg/dL — ABNORMAL HIGH (ref 65–99)
Potassium: 3.9 mmol/L (ref 3.5–5.2)
Sodium: 144 mmol/L (ref 134–144)

## 2020-07-30 ENCOUNTER — Encounter: Payer: Self-pay | Admitting: *Deleted

## 2020-07-30 NOTE — Telephone Encounter (Signed)
Yes, they can use pressure ulcer stage 2 diagnosis. Please send copy of pmhx.  tks

## 2020-07-30 NOTE — Telephone Encounter (Signed)
Patient had an doctors appointment on Friday and they will schedule evaluation this week.

## 2020-07-30 NOTE — Telephone Encounter (Signed)
I spoke with Briana Miller and she stated that they need a specific diagnosis code for patient.  We at first sent it as a skin ulcer.  She stated that they evaluated it and it looks like from where she sits in her wheelchair a pressure ulcer stage 2.   They would like to know if they can use that as the diagnosis code.    Also they needed a list of her comorbidities.  I will fax those over to [facility] at [phone]

## 2020-07-30 NOTE — Telephone Encounter (Signed)
Diagnosis given to [facility] and health history faxed as well.

## 2020-07-31 DIAGNOSIS — I5020 Unspecified systolic (congestive) heart failure: Secondary | ICD-10-CM | POA: Diagnosis not present

## 2020-07-31 DIAGNOSIS — D649 Anemia, unspecified: Secondary | ICD-10-CM | POA: Diagnosis not present

## 2020-07-31 DIAGNOSIS — I10 Essential (primary) hypertension: Secondary | ICD-10-CM | POA: Diagnosis not present

## 2020-07-31 DIAGNOSIS — I429 Cardiomyopathy, unspecified: Secondary | ICD-10-CM | POA: Diagnosis not present

## 2020-07-31 DIAGNOSIS — K219 Gastro-esophageal reflux disease without esophagitis: Secondary | ICD-10-CM | POA: Diagnosis not present

## 2020-07-31 DIAGNOSIS — I110 Hypertensive heart disease with heart failure: Secondary | ICD-10-CM | POA: Diagnosis not present

## 2020-07-31 DIAGNOSIS — M1990 Unspecified osteoarthritis, unspecified site: Secondary | ICD-10-CM | POA: Diagnosis not present

## 2020-07-31 DIAGNOSIS — N3281 Overactive bladder: Secondary | ICD-10-CM | POA: Diagnosis not present

## 2020-07-31 DIAGNOSIS — J329 Chronic sinusitis, unspecified: Secondary | ICD-10-CM | POA: Diagnosis not present

## 2020-07-31 DIAGNOSIS — L89322 Pressure ulcer of left buttock, stage 2: Secondary | ICD-10-CM | POA: Diagnosis not present

## 2020-07-31 DIAGNOSIS — L89312 Pressure ulcer of right buttock, stage 2: Secondary | ICD-10-CM | POA: Diagnosis not present

## 2020-07-31 DIAGNOSIS — M5000 Cervical disc disorder with myelopathy, unspecified cervical region: Secondary | ICD-10-CM | POA: Diagnosis not present

## 2020-07-31 DIAGNOSIS — E785 Hyperlipidemia, unspecified: Secondary | ICD-10-CM | POA: Diagnosis not present

## 2020-08-03 ENCOUNTER — Other Ambulatory Visit: Payer: Self-pay | Admitting: Family

## 2020-08-06 DIAGNOSIS — I429 Cardiomyopathy, unspecified: Secondary | ICD-10-CM | POA: Diagnosis not present

## 2020-08-06 DIAGNOSIS — L89312 Pressure ulcer of right buttock, stage 2: Secondary | ICD-10-CM | POA: Diagnosis not present

## 2020-08-06 DIAGNOSIS — D649 Anemia, unspecified: Secondary | ICD-10-CM | POA: Diagnosis not present

## 2020-08-06 DIAGNOSIS — N3281 Overactive bladder: Secondary | ICD-10-CM | POA: Diagnosis not present

## 2020-08-06 DIAGNOSIS — M5000 Cervical disc disorder with myelopathy, unspecified cervical region: Secondary | ICD-10-CM | POA: Diagnosis not present

## 2020-08-06 DIAGNOSIS — I110 Hypertensive heart disease with heart failure: Secondary | ICD-10-CM | POA: Diagnosis not present

## 2020-08-06 DIAGNOSIS — E785 Hyperlipidemia, unspecified: Secondary | ICD-10-CM | POA: Diagnosis not present

## 2020-08-06 DIAGNOSIS — L89322 Pressure ulcer of left buttock, stage 2: Secondary | ICD-10-CM | POA: Diagnosis not present

## 2020-08-06 DIAGNOSIS — I10 Essential (primary) hypertension: Secondary | ICD-10-CM | POA: Diagnosis not present

## 2020-08-06 DIAGNOSIS — J329 Chronic sinusitis, unspecified: Secondary | ICD-10-CM | POA: Diagnosis not present

## 2020-08-06 DIAGNOSIS — M1990 Unspecified osteoarthritis, unspecified site: Secondary | ICD-10-CM | POA: Diagnosis not present

## 2020-08-06 DIAGNOSIS — I5020 Unspecified systolic (congestive) heart failure: Secondary | ICD-10-CM | POA: Diagnosis not present

## 2020-08-06 DIAGNOSIS — K219 Gastro-esophageal reflux disease without esophagitis: Secondary | ICD-10-CM | POA: Diagnosis not present

## 2020-08-10 ENCOUNTER — Ambulatory Visit: Payer: Medicare Other | Admitting: Physician Assistant

## 2020-08-10 ENCOUNTER — Encounter: Payer: Self-pay | Admitting: Physician Assistant

## 2020-08-10 ENCOUNTER — Telehealth: Payer: Self-pay | Admitting: Physician Assistant

## 2020-08-10 ENCOUNTER — Other Ambulatory Visit: Payer: Self-pay

## 2020-08-10 VITALS — BP 142/80 | HR 92 | Ht 66.0 in

## 2020-08-10 DIAGNOSIS — R197 Diarrhea, unspecified: Secondary | ICD-10-CM

## 2020-08-10 DIAGNOSIS — K649 Unspecified hemorrhoids: Secondary | ICD-10-CM

## 2020-08-10 DIAGNOSIS — K219 Gastro-esophageal reflux disease without esophagitis: Secondary | ICD-10-CM | POA: Diagnosis not present

## 2020-08-10 DIAGNOSIS — R110 Nausea: Secondary | ICD-10-CM

## 2020-08-10 DIAGNOSIS — R1013 Epigastric pain: Secondary | ICD-10-CM | POA: Diagnosis not present

## 2020-08-10 MED ORDER — AMBULATORY NON FORMULARY MEDICATION: 0 refills | Status: DC

## 2020-08-10 MED ORDER — FAMOTIDINE 40 MG PO TABS: 40.0000 mg | ORAL_TABLET | Freq: Every day | ORAL | 5 refills | Status: DC

## 2020-08-10 MED ORDER — PANTOPRAZOLE SODIUM 40 MG PO TBEC: 40.0000 mg | DELAYED_RELEASE_TABLET | Freq: Two times a day (BID) | ORAL | 3 refills | Status: DC

## 2020-08-10 MED ORDER — FAMOTIDINE 40 MG PO TABS
40.0000 mg | ORAL_TABLET | Freq: Two times a day (BID) | ORAL | 5 refills | Status: DC
Start: 2020-08-10 — End: 2021-02-15

## 2020-08-10 NOTE — Progress Notes (Signed)
Message sent to [facility] that the pt can be scheduled for 4/7 at 930 am [facility] with Dr Christella Hartigan

## 2020-08-10 NOTE — Progress Notes (Signed)
Chief Complaint: Epigastric pain  Review of pertinent gastrointestinal problems: 1. Family history of colon cancer; mother in her early 27s; colonoscopy Dr. Christella Hartigan 05/2013 found diverticulosis, no polyps, recommended recall at 5 years.  02/24/2019 colonoscopy with diverticulosis in the whole colon and otherwise normal, recall 5 years due to family history 2. RUQ pains: clinically seemed biliary-like; 05/2013 [place] [facility] was normal; 05/2013 HIDA scan with GB EF was normal.05/2013 LFTs were normal.  Was recommended, scheduled for EGD but she cancelled it.  EGD 05/2014 Dr. Christella Hartigan, mild to moderate gastritis; path showed no H. Pylori.  She had been taking BC powders twice a day (around 2013-2015).  Repeat EGD June 2017, Dr. Christella Hartigan.  This was requested by her bariatric surgeon prior to surgery given her history of gastritis.  I noted a small about of food residue in the stomach, the examination was otherwise normal.   10/01/2019 CT abdomen pelvis with contrast showed no acute abnormality, stable mildly enlarged myomatous uterus, mild diffuse colonic diverticulosis, nonspecific mild patchy subpleural groundglass opacity in the lingula  3. Morbid obesity BMI 41 (10/2017 data)  HPI:    Mrs. Ruddell is a 65 year old African-American female with a past medical history as listed below including CHF, GERD and acute systolic congestive heart failure (echo 10/21 with ejection fraction 30-35%), known to Dr. Christella Hartigan, who was referred to me by Sandford Craze, NP for a complaint of epigastric pain and nausea.       11/04/2019 patient seen in clinic and described nausea which seem to come and go throughout the day.  Also some epigastric pain.  At that time stopped omeprazole and started pantoprazole 40 twice a day.  Also prescribe Zofran every 4-6 hours.    Today, the patient presents to clinic accompanied by her daughter who does assist with history.  She explains that since being seen last she really has continued with  symptoms of nausea which is "becoming more often".  Tells me now she is also having severe epigastric pain 2-3 times a week that can hit "anytime", it does not seem to matter what she is eating or when she has eaten or if she has eaten at all.  Describes this pain as a sharp 10/10 which radiates through to her back and she gets sweaty and nauseated and this will often result in diarrhea.  During this time the only thing that seems to help is taking two Zofran.  Overall this lasts for a few hours at a time.  Also continues with reflux symptoms regardless of her Pantoprazole 40 twice a day.  Tells me that when she has diarrhea it "activates my hemorrhoids", currently only using over-the-counter cream and request something else.    Denies fever, chills, weight loss or blood in her stools.  Past Medical History:  Diagnosis Date  . Acute systolic congestive heart failure (HCC) 03/01/2014  . Allergy   . Anemia   . Arthritis   . Cardiomyopathy (HCC)   . Cervical myelopathy (HCC)   . CHF (congestive heart failure) (HCC)   . GERD (gastroesophageal reflux disease)   . Heart disease   . Hyperlipidemia   . Hypertension   . Leg pain   . Overactive bladder     Past Surgical History:  Procedure Laterality Date  . ANTERIOR CERVICAL DECOMP/DISCECTOMY FUSION N/A 08/03/2016   Procedure: ANTERIOR CERVICAL DECOMPRESSION/DISCECTOMY FUSION C4-5, C5-6;  Surgeon: Barnett Abu, MD;  Location: [facility] OR;  Service: Neurosurgery;  Laterality: N/A;  . COLONOSCOPY    .  COLONOSCOPY WITH PROPOFOL N/A 02/24/2019   Procedure: COLONOSCOPY WITH PROPOFOL;  Surgeon: Rachael Fee, MD;  Location: [facility];  Service: Endoscopy;  Laterality: N/A;  . KNEE ARTHROSCOPY     2007  . ROTATOR CUFF REPAIR Right 03/23/13  . TOTAL KNEE ARTHROPLASTY  09/22/2011   Procedure: TOTAL KNEE ARTHROPLASTY;  Surgeon: Raymon Mutton, MD;  Location: [facility] OR;  Service: Orthopedics;  Laterality: Right;    Current Outpatient Medications  Medication  Sig Dispense Refill  . amoxicillin (AMOXIL) 500 MG capsule Take 1 capsule (500 mg total) by mouth 3 (three) times daily. 30 capsule 0  . carvedilol (COREG) 12.5 MG tablet Take 1 tablet (12.5 mg total) by mouth 2 (two) times daily. 180 tablet 4  . Cholecalciferol (VITAMIN D3 PO) Take 1 tablet by mouth daily.     . fluconazole (DIFLUCAN) 150 MG tablet Take 1 tablet by mouth once as needed for yeast infection. May repeat in 3 days if needed 2 tablet 0  . gabapentin (NEURONTIN) 300 MG capsule Take 3 capsules (900 mg total) by mouth 3 (three) times daily. 810 capsule 1  . latanoprost (XALATAN) 0.005 % ophthalmic solution Place 1 drop into both eyes at bedtime.   11  . Melatonin 3 MG TABS Take 1 tablet by mouth as needed.     . nortriptyline (PAMELOR) 25 MG capsule TAKE 2 CAPSULES BY MOUTH AT BEDTIME 60 capsule 0  . NYAMYC powder APPLY TO AFFECTED AREA(S)  TOPICALLY TWICE DAILY 180 g 1  . pantoprazole (PROTONIX) 40 MG tablet TAKE 1 TABLET BY MOUTH  TWICE DAILY 180 tablet 3  . potassium chloride SA (KLOR-CON) 20 MEQ tablet Take 1 tablet (20 mEq total) by mouth daily. 90 tablet 1  . sacubitril-valsartan (ENTRESTO) 24-26 MG Take 1 tablet by mouth 2 (two) times daily. 60 tablet 6  . spironolactone (ALDACTONE) 25 MG tablet Take 1 tablet (25 mg total) by mouth daily. 90 tablet 3  . timolol (BETIMOL) 0.5 % ophthalmic solution Place 1 drop into both eyes every morning.     . torsemide (DEMADEX) 20 MG tablet Take two tablets alternating with one tablet every other day 270 tablet 3   No current facility-administered medications for this visit.    Allergies as of 08/10/2020 - Review Complete 07/27/2020  Allergen Reaction Noted  . Entresto [sacubitril-valsartan]  04/12/2020    Family History  Problem Relation Age of Onset  . Cancer Mother        colon 71 and breast 91- deceased  . Colon cancer Mother   . Diabetes Father        living  . Asthma Father   . Hypertension Father   . Heart attack Father 100        medical management per pt  . Glaucoma Father        had eye transplant  . Kidney failure Father        acute renal failure- died 28  . Esophageal cancer Neg Hx   . Stomach cancer Neg Hx   . Rectal cancer Neg Hx     Social History   Socioeconomic History  . Marital status: Married    Spouse name: Not on file  . Number of children: 2  . Years of education: HS  . Highest education level: Not on file  Occupational History  . 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 Smoking status: Never Smoker  . Smokeless tobacco: Never Used  Substance and Sexual Activity  . Alcohol use: Not Currently    Alcohol/week: 2.0 standard drinks    Types: 2 Glasses of wine per week  . Drug use: No  . Sexual activity: Not on file  Other Topics Concern  . 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   1-2 cups caffeine daily.   Lives at home with her husband.   Social Determinants of Health   Financial Resource Strain: Not on file  Food Insecurity: Not on file  Transportation Needs: Not on file  Physical Activity: Not on file  Stress: Not on file  Social Connections: Not on file  Intimate Partner Violence: Not on file    Review of Systems:    Constitutional: No weight loss, fever or chills Skin: No rash  Cardiovascular: No chest pain Respiratory: No SOB Gastrointestinal: See HPI and otherwise negative Genitourinary: No dysuria  Neurological: No headache, dizziness or syncope Musculoskeletal: No new muscle or joint pain Hematologic: No bleeding  Psychiatric: No history of depression or anxiety   Physical Exam:  Vital signs: BP (!) 142/80   Pulse 92   Ht 5\' 6"  (1.676 m)   LMP 06/17/2007    BMI 35.51 kg/m   (unable to stand and weigh- BMI inaccurate) Constitutional:   Pleasant morbidly obese AA female appears to be in NAD, Well developed, Well nourished, alert and cooperative Respiratory: Respirations even and unlabored. Lungs clear to auscultation bilaterally.   No wheezes, crackles, or rhonchi.  Cardiovascular: Normal S1, S2. No MRG. Regular rate and rhythm. No peripheral edema, cyanosis or pallor.  Gastrointestinal: Morbidly obese, soft, nondistended, mild epigastric TTP no rebound or guarding. Normal bowel sounds. No appreciable masses or hepatomegaly. Rectal:  Not performed.  Msk:  Symmetrical without gross deformities. Without edema, no deformity or joint abnormality. In wheelchair Psychiatric:  Demonstrates good judgement and reason without abnormal affect or behaviors.  RELEVANT LABS AND IMAGING: CBC    Component Value Date/Time   WBC 6.0 04/08/2019 1406   RBC 4.23 04/08/2019 1406   HGB 11.9 (L) 04/08/2019 1406   HCT 35.9 (L) 04/08/2019 1406   PLT 269.0 04/08/2019 1406   MCV 84.8 04/08/2019 1406   MCH 29.1 08/05/2016 0541   MCHC 33.2 04/08/2019 1406   RDW 14.6 04/08/2019 1406   LYMPHSABS 2.1 04/08/2019 1406   MONOABS 0.9 04/08/2019 1406   EOSABS 0.1 04/08/2019 1406   BASOSABS 0.0 04/08/2019 1406    CMP     Component Value Date/Time   NA 144 07/27/2020 1446   K 3.9 07/27/2020 1446   CL 103 07/27/2020 1446   CO2 24 07/27/2020 1446   GLUCOSE 119 (H) 07/27/2020 1446   GLUCOSE 105 (H) 12/02/2019 1434   BUN 25 07/27/2020 1446   CREATININE 1.27 (H) 07/27/2020 1446   CREATININE 0.95 12/02/2019 1434   CALCIUM 9.3 07/27/2020 1446   PROT 7.2 04/08/2019 1406   ALBUMIN 4.2 04/08/2019 1406   AST 16 04/08/2019 1406   ALT 13 04/08/2019 1406   ALKPHOS 87 04/08/2019 1406   BILITOT 0.6 04/08/2019 1406   GFRNONAA 45 (L) 07/27/2020 1446   GFRAA 52 (L) 07/27/2020 1446    Assessment: 1.  Epigastric pain: Severe episodes of epigastric pain 2-3 times a week which  last for a couple of hours, some better with Zofran; consider gastritis versus other 2.  Nausea: With above 3.  GERD: Regardless of Pantoprazole 40 twice a day 4.  Hemorrhoids: Patient reports irritation from these after time of diarrhea  5.  Diarrhea: Likely related to epigastric symptoms/gastritis  Plan: 1.  Recommend repeating EGD at this time.  This will need to be done in the hospital given patient's BMI and wheelchair use.  Will discuss with Dr. Christella Hartigan when he is next available.  Discussed with patient that it may be a couple of months. 2.  For now will start Pepcid 40 mg twice daily, every morning and nightly #60 with 5 refills. 3.  Refilled Pantoprazole 40 mg twice daily, 30-60 minutes before breakfast and dinner #60 with 11 refills. 4.  Prescribed GI cocktail 5-10 mL every 4-6 hours as needed for abdominal pain 5.  Prescribed Hydrocortisone ointment per request from the patient for hemorrhoids to be applied twice daily no longer than 2 weeks. 6.  Patient will follow in clinic per recommendations after time of EGD with Dr. Christella Hartigan.  Hyacinth Meeker, PA-C [facility] 08/10/2020, 11:41 AM  Cc: Sandford Craze, NP

## 2020-08-10 NOTE — Progress Notes (Signed)
Maya you can add her on for 4/7 at 930 am at [facility].  Thanks

## 2020-08-10 NOTE — Patient Instructions (Addendum)
If you are age 63 or older, your body mass index should be between 23-30. Your Body mass index is 35.51 kg/m. If this is out of the aforementioned range listed, please consider follow up with your Primary Care Provider.  If you are age 83 or younger, your body mass index should be between 19-25. Your Body mass index is 35.51 kg/m. If this is out of the aformentioned range listed, please consider follow up with your Primary Care Provider.   We have sent the following medications to your pharmacy for you to pick up at your convenience: Pantoprazole 40 mg , Pepcid 40 mg , Gi cocktail  We will call to schedule EGD with Dr.Jacobs   Start hydrocortisone cream twice daily for 2 weeks for hemorrhoids   Thank you for choosing me and [facility].  Hyacinth Meeker, PA-C

## 2020-08-10 NOTE — Telephone Encounter (Signed)
[facility] rep called to request for the GI cocktail script to be resent

## 2020-08-10 NOTE — Progress Notes (Signed)
I agree with the above note, plan  Patty, can you help with finding my next available [facility] long Thursday option for EGD? Thank you

## 2020-08-10 NOTE — Telephone Encounter (Signed)
Resent prescription to [facility]

## 2020-08-13 DIAGNOSIS — I110 Hypertensive heart disease with heart failure: Secondary | ICD-10-CM | POA: Diagnosis not present

## 2020-08-13 DIAGNOSIS — I5020 Unspecified systolic (congestive) heart failure: Secondary | ICD-10-CM | POA: Diagnosis not present

## 2020-08-13 DIAGNOSIS — M5000 Cervical disc disorder with myelopathy, unspecified cervical region: Secondary | ICD-10-CM | POA: Diagnosis not present

## 2020-08-13 DIAGNOSIS — M1990 Unspecified osteoarthritis, unspecified site: Secondary | ICD-10-CM | POA: Diagnosis not present

## 2020-08-13 DIAGNOSIS — J329 Chronic sinusitis, unspecified: Secondary | ICD-10-CM | POA: Diagnosis not present

## 2020-08-13 DIAGNOSIS — L89322 Pressure ulcer of left buttock, stage 2: Secondary | ICD-10-CM | POA: Diagnosis not present

## 2020-08-13 DIAGNOSIS — N3281 Overactive bladder: Secondary | ICD-10-CM | POA: Diagnosis not present

## 2020-08-13 DIAGNOSIS — E785 Hyperlipidemia, unspecified: Secondary | ICD-10-CM | POA: Diagnosis not present

## 2020-08-13 DIAGNOSIS — D649 Anemia, unspecified: Secondary | ICD-10-CM | POA: Diagnosis not present

## 2020-08-13 DIAGNOSIS — I10 Essential (primary) hypertension: Secondary | ICD-10-CM | POA: Diagnosis not present

## 2020-08-13 DIAGNOSIS — K219 Gastro-esophageal reflux disease without esophagitis: Secondary | ICD-10-CM | POA: Diagnosis not present

## 2020-08-13 DIAGNOSIS — L89312 Pressure ulcer of right buttock, stage 2: Secondary | ICD-10-CM | POA: Diagnosis not present

## 2020-08-13 DIAGNOSIS — I429 Cardiomyopathy, unspecified: Secondary | ICD-10-CM | POA: Diagnosis not present

## 2020-08-14 DIAGNOSIS — L89312 Pressure ulcer of right buttock, stage 2: Secondary | ICD-10-CM | POA: Diagnosis not present

## 2020-08-14 DIAGNOSIS — N3281 Overactive bladder: Secondary | ICD-10-CM | POA: Diagnosis not present

## 2020-08-14 DIAGNOSIS — I10 Essential (primary) hypertension: Secondary | ICD-10-CM | POA: Diagnosis not present

## 2020-08-14 DIAGNOSIS — I110 Hypertensive heart disease with heart failure: Secondary | ICD-10-CM | POA: Diagnosis not present

## 2020-08-14 DIAGNOSIS — L89322 Pressure ulcer of left buttock, stage 2: Secondary | ICD-10-CM | POA: Diagnosis not present

## 2020-08-14 DIAGNOSIS — E785 Hyperlipidemia, unspecified: Secondary | ICD-10-CM | POA: Diagnosis not present

## 2020-08-14 DIAGNOSIS — D649 Anemia, unspecified: Secondary | ICD-10-CM | POA: Diagnosis not present

## 2020-08-14 DIAGNOSIS — M1990 Unspecified osteoarthritis, unspecified site: Secondary | ICD-10-CM | POA: Diagnosis not present

## 2020-08-14 DIAGNOSIS — M5000 Cervical disc disorder with myelopathy, unspecified cervical region: Secondary | ICD-10-CM | POA: Diagnosis not present

## 2020-08-14 DIAGNOSIS — K219 Gastro-esophageal reflux disease without esophagitis: Secondary | ICD-10-CM | POA: Diagnosis not present

## 2020-08-14 DIAGNOSIS — I429 Cardiomyopathy, unspecified: Secondary | ICD-10-CM | POA: Diagnosis not present

## 2020-08-14 DIAGNOSIS — I5020 Unspecified systolic (congestive) heart failure: Secondary | ICD-10-CM | POA: Diagnosis not present

## 2020-08-14 DIAGNOSIS — J329 Chronic sinusitis, unspecified: Secondary | ICD-10-CM | POA: Diagnosis not present

## 2020-08-17 ENCOUNTER — Ambulatory Visit (HOSPITAL_COMMUNITY): Payer: Medicare Other

## 2020-08-18 DIAGNOSIS — G8220 Paraplegia, unspecified: Secondary | ICD-10-CM | POA: Diagnosis not present

## 2020-08-18 DIAGNOSIS — G959 Disease of spinal cord, unspecified: Secondary | ICD-10-CM | POA: Diagnosis not present

## 2020-08-20 DIAGNOSIS — I110 Hypertensive heart disease with heart failure: Secondary | ICD-10-CM | POA: Diagnosis not present

## 2020-08-20 DIAGNOSIS — J329 Chronic sinusitis, unspecified: Secondary | ICD-10-CM | POA: Diagnosis not present

## 2020-08-20 DIAGNOSIS — I10 Essential (primary) hypertension: Secondary | ICD-10-CM | POA: Diagnosis not present

## 2020-08-20 DIAGNOSIS — M1990 Unspecified osteoarthritis, unspecified site: Secondary | ICD-10-CM | POA: Diagnosis not present

## 2020-08-20 DIAGNOSIS — K219 Gastro-esophageal reflux disease without esophagitis: Secondary | ICD-10-CM | POA: Diagnosis not present

## 2020-08-20 DIAGNOSIS — L89312 Pressure ulcer of right buttock, stage 2: Secondary | ICD-10-CM | POA: Diagnosis not present

## 2020-08-20 DIAGNOSIS — I5020 Unspecified systolic (congestive) heart failure: Secondary | ICD-10-CM | POA: Diagnosis not present

## 2020-08-20 DIAGNOSIS — I429 Cardiomyopathy, unspecified: Secondary | ICD-10-CM | POA: Diagnosis not present

## 2020-08-20 DIAGNOSIS — N3281 Overactive bladder: Secondary | ICD-10-CM | POA: Diagnosis not present

## 2020-08-20 DIAGNOSIS — M5000 Cervical disc disorder with myelopathy, unspecified cervical region: Secondary | ICD-10-CM | POA: Diagnosis not present

## 2020-08-20 DIAGNOSIS — L89322 Pressure ulcer of left buttock, stage 2: Secondary | ICD-10-CM | POA: Diagnosis not present

## 2020-08-20 DIAGNOSIS — D649 Anemia, unspecified: Secondary | ICD-10-CM | POA: Diagnosis not present

## 2020-08-20 DIAGNOSIS — E785 Hyperlipidemia, unspecified: Secondary | ICD-10-CM | POA: Diagnosis not present

## 2020-08-21 DIAGNOSIS — I429 Cardiomyopathy, unspecified: Secondary | ICD-10-CM | POA: Diagnosis not present

## 2020-08-21 DIAGNOSIS — J329 Chronic sinusitis, unspecified: Secondary | ICD-10-CM | POA: Diagnosis not present

## 2020-08-21 DIAGNOSIS — D649 Anemia, unspecified: Secondary | ICD-10-CM | POA: Diagnosis not present

## 2020-08-21 DIAGNOSIS — M1990 Unspecified osteoarthritis, unspecified site: Secondary | ICD-10-CM | POA: Diagnosis not present

## 2020-08-21 DIAGNOSIS — E785 Hyperlipidemia, unspecified: Secondary | ICD-10-CM | POA: Diagnosis not present

## 2020-08-21 DIAGNOSIS — M5000 Cervical disc disorder with myelopathy, unspecified cervical region: Secondary | ICD-10-CM | POA: Diagnosis not present

## 2020-08-21 DIAGNOSIS — I10 Essential (primary) hypertension: Secondary | ICD-10-CM | POA: Diagnosis not present

## 2020-08-21 DIAGNOSIS — K219 Gastro-esophageal reflux disease without esophagitis: Secondary | ICD-10-CM | POA: Diagnosis not present

## 2020-08-21 DIAGNOSIS — L89322 Pressure ulcer of left buttock, stage 2: Secondary | ICD-10-CM | POA: Diagnosis not present

## 2020-08-21 DIAGNOSIS — I5020 Unspecified systolic (congestive) heart failure: Secondary | ICD-10-CM | POA: Diagnosis not present

## 2020-08-21 DIAGNOSIS — N3281 Overactive bladder: Secondary | ICD-10-CM | POA: Diagnosis not present

## 2020-08-21 DIAGNOSIS — L89312 Pressure ulcer of right buttock, stage 2: Secondary | ICD-10-CM | POA: Diagnosis not present

## 2020-08-21 DIAGNOSIS — I110 Hypertensive heart disease with heart failure: Secondary | ICD-10-CM | POA: Diagnosis not present

## 2020-08-24 ENCOUNTER — Other Ambulatory Visit: Payer: Self-pay

## 2020-08-24 MED ORDER — NA SULFATE-K SULFATE-MG SULF 17.5-3.13-1.6 GM/177ML PO SOLN: 1.0000 | Freq: Once | ORAL | 0 refills | Status: AC

## 2020-08-28 DIAGNOSIS — N3281 Overactive bladder: Secondary | ICD-10-CM | POA: Diagnosis not present

## 2020-08-28 DIAGNOSIS — L89312 Pressure ulcer of right buttock, stage 2: Secondary | ICD-10-CM | POA: Diagnosis not present

## 2020-08-28 DIAGNOSIS — E785 Hyperlipidemia, unspecified: Secondary | ICD-10-CM | POA: Diagnosis not present

## 2020-08-28 DIAGNOSIS — J329 Chronic sinusitis, unspecified: Secondary | ICD-10-CM | POA: Diagnosis not present

## 2020-08-28 DIAGNOSIS — L89322 Pressure ulcer of left buttock, stage 2: Secondary | ICD-10-CM | POA: Diagnosis not present

## 2020-08-28 DIAGNOSIS — M1990 Unspecified osteoarthritis, unspecified site: Secondary | ICD-10-CM | POA: Diagnosis not present

## 2020-08-28 DIAGNOSIS — K219 Gastro-esophageal reflux disease without esophagitis: Secondary | ICD-10-CM | POA: Diagnosis not present

## 2020-08-28 DIAGNOSIS — I5020 Unspecified systolic (congestive) heart failure: Secondary | ICD-10-CM | POA: Diagnosis not present

## 2020-08-28 DIAGNOSIS — I110 Hypertensive heart disease with heart failure: Secondary | ICD-10-CM | POA: Diagnosis not present

## 2020-08-28 DIAGNOSIS — I10 Essential (primary) hypertension: Secondary | ICD-10-CM | POA: Diagnosis not present

## 2020-08-28 DIAGNOSIS — D649 Anemia, unspecified: Secondary | ICD-10-CM | POA: Diagnosis not present

## 2020-08-28 DIAGNOSIS — I429 Cardiomyopathy, unspecified: Secondary | ICD-10-CM | POA: Diagnosis not present

## 2020-08-28 DIAGNOSIS — M5000 Cervical disc disorder with myelopathy, unspecified cervical region: Secondary | ICD-10-CM | POA: Diagnosis not present

## 2020-08-31 ENCOUNTER — Telehealth: Payer: Self-pay

## 2020-08-31 NOTE — Telephone Encounter (Signed)
Left voicemail for patient to call me back.

## 2020-09-03 DIAGNOSIS — D649 Anemia, unspecified: Secondary | ICD-10-CM | POA: Diagnosis not present

## 2020-09-03 DIAGNOSIS — J329 Chronic sinusitis, unspecified: Secondary | ICD-10-CM | POA: Diagnosis not present

## 2020-09-03 DIAGNOSIS — I110 Hypertensive heart disease with heart failure: Secondary | ICD-10-CM | POA: Diagnosis not present

## 2020-09-03 DIAGNOSIS — E785 Hyperlipidemia, unspecified: Secondary | ICD-10-CM | POA: Diagnosis not present

## 2020-09-03 DIAGNOSIS — L89312 Pressure ulcer of right buttock, stage 2: Secondary | ICD-10-CM | POA: Diagnosis not present

## 2020-09-03 DIAGNOSIS — L89322 Pressure ulcer of left buttock, stage 2: Secondary | ICD-10-CM | POA: Diagnosis not present

## 2020-09-03 DIAGNOSIS — I429 Cardiomyopathy, unspecified: Secondary | ICD-10-CM | POA: Diagnosis not present

## 2020-09-03 DIAGNOSIS — M1990 Unspecified osteoarthritis, unspecified site: Secondary | ICD-10-CM | POA: Diagnosis not present

## 2020-09-03 DIAGNOSIS — I10 Essential (primary) hypertension: Secondary | ICD-10-CM | POA: Diagnosis not present

## 2020-09-03 DIAGNOSIS — I5020 Unspecified systolic (congestive) heart failure: Secondary | ICD-10-CM | POA: Diagnosis not present

## 2020-09-03 DIAGNOSIS — K219 Gastro-esophageal reflux disease without esophagitis: Secondary | ICD-10-CM | POA: Diagnosis not present

## 2020-09-03 DIAGNOSIS — N3281 Overactive bladder: Secondary | ICD-10-CM | POA: Diagnosis not present

## 2020-09-03 DIAGNOSIS — M5000 Cervical disc disorder with myelopathy, unspecified cervical region: Secondary | ICD-10-CM | POA: Diagnosis not present

## 2020-09-07 ENCOUNTER — Telehealth: Payer: Self-pay

## 2020-09-07 NOTE — Telephone Encounter (Signed)
Left voicemail for patient to call me back regarding her prep instructions. If she received them via mychart and confirm she knew her Covid test date.

## 2020-09-10 ENCOUNTER — Telehealth: Payer: Self-pay

## 2020-09-10 NOTE — Telephone Encounter (Signed)
No one has Friday hospital days I will call her back and let her know.

## 2020-09-10 NOTE — Telephone Encounter (Signed)
Does anyone have Friday time at the hospital?  Can you let me know who?  Thanks-JLL

## 2020-09-10 NOTE — Telephone Encounter (Signed)
Patient called back requesting to reschedule her procedure to a Friday if possible.  Please advise.

## 2020-09-10 NOTE — Telephone Encounter (Signed)
Patient returned called stating that she needed a Friday appointment and could not do Thursday. Patient would like to know if another provider can do procedure since Dr.Jacobs has no Friday dates.

## 2020-09-10 NOTE — Telephone Encounter (Signed)
Left VM for patient to call back regarding rescheduling her colonoscopy.

## 2020-09-10 NOTE — Telephone Encounter (Signed)
Left Voice mail for patient to call back regarding her procedure next week and covid test. Patient still needs to come in to go over prep instructions.

## 2020-09-11 DIAGNOSIS — I429 Cardiomyopathy, unspecified: Secondary | ICD-10-CM | POA: Diagnosis not present

## 2020-09-11 DIAGNOSIS — L89322 Pressure ulcer of left buttock, stage 2: Secondary | ICD-10-CM | POA: Diagnosis not present

## 2020-09-11 DIAGNOSIS — I5020 Unspecified systolic (congestive) heart failure: Secondary | ICD-10-CM | POA: Diagnosis not present

## 2020-09-11 DIAGNOSIS — M5000 Cervical disc disorder with myelopathy, unspecified cervical region: Secondary | ICD-10-CM | POA: Diagnosis not present

## 2020-09-11 DIAGNOSIS — K219 Gastro-esophageal reflux disease without esophagitis: Secondary | ICD-10-CM | POA: Diagnosis not present

## 2020-09-11 DIAGNOSIS — J329 Chronic sinusitis, unspecified: Secondary | ICD-10-CM | POA: Diagnosis not present

## 2020-09-11 DIAGNOSIS — D649 Anemia, unspecified: Secondary | ICD-10-CM | POA: Diagnosis not present

## 2020-09-11 DIAGNOSIS — M1990 Unspecified osteoarthritis, unspecified site: Secondary | ICD-10-CM | POA: Diagnosis not present

## 2020-09-11 DIAGNOSIS — I10 Essential (primary) hypertension: Secondary | ICD-10-CM | POA: Diagnosis not present

## 2020-09-11 DIAGNOSIS — I110 Hypertensive heart disease with heart failure: Secondary | ICD-10-CM | POA: Diagnosis not present

## 2020-09-11 DIAGNOSIS — N3281 Overactive bladder: Secondary | ICD-10-CM | POA: Diagnosis not present

## 2020-09-11 DIAGNOSIS — L89312 Pressure ulcer of right buttock, stage 2: Secondary | ICD-10-CM | POA: Diagnosis not present

## 2020-09-11 DIAGNOSIS — E785 Hyperlipidemia, unspecified: Secondary | ICD-10-CM | POA: Diagnosis not present

## 2020-09-17 ENCOUNTER — Other Ambulatory Visit (HOSPITAL_COMMUNITY): Payer: Medicare Other

## 2020-09-18 DIAGNOSIS — G959 Disease of spinal cord, unspecified: Secondary | ICD-10-CM | POA: Diagnosis not present

## 2020-09-18 DIAGNOSIS — G8220 Paraplegia, unspecified: Secondary | ICD-10-CM | POA: Diagnosis not present

## 2020-09-20 ENCOUNTER — Encounter (HOSPITAL_COMMUNITY): Payer: Self-pay

## 2020-09-20 ENCOUNTER — Ambulatory Visit (HOSPITAL_COMMUNITY): Admit: 2020-09-20 | Payer: Medicare Other | Admitting: Gastroenterology

## 2020-09-20 SURGERY — ESOPHAGOGASTRODUODENOSCOPY (EGD) WITH PROPOFOL
Anesthesia: Monitor Anesthesia Care

## 2020-10-01 ENCOUNTER — Other Ambulatory Visit: Payer: Self-pay | Admitting: Physician Assistant

## 2020-10-02 ENCOUNTER — Other Ambulatory Visit (HOSPITAL_COMMUNITY): Payer: Medicare Other

## 2020-10-05 ENCOUNTER — Ambulatory Visit (HOSPITAL_COMMUNITY): Payer: Medicare Other

## 2020-10-12 ENCOUNTER — Telehealth (INDEPENDENT_AMBULATORY_CARE_PROVIDER_SITE_OTHER): Payer: Medicare Other | Admitting: Family

## 2020-10-12 ENCOUNTER — Other Ambulatory Visit: Payer: Self-pay

## 2020-10-12 VITALS — BP 115/80 | HR 83 | Temp 98.0°F | Resp 16

## 2020-10-12 DIAGNOSIS — J0100 Acute maxillary sinusitis, unspecified: Secondary | ICD-10-CM | POA: Diagnosis not present

## 2020-10-12 DIAGNOSIS — M25532 Pain in left wrist: Secondary | ICD-10-CM | POA: Diagnosis not present

## 2020-10-12 DIAGNOSIS — R400 Somnolence: Secondary | ICD-10-CM | POA: Diagnosis not present

## 2020-10-12 DIAGNOSIS — R7303 Prediabetes: Secondary | ICD-10-CM | POA: Diagnosis not present

## 2020-10-12 DIAGNOSIS — J019 Acute sinusitis, unspecified: Secondary | ICD-10-CM | POA: Insufficient documentation

## 2020-10-12 MED ORDER — CETIRIZINE HCL 10 MG PO TABS: 10.0000 mg | ORAL_TABLET | Freq: Every day | ORAL | 11 refills | Status: DC

## 2020-10-12 MED ORDER — FLUTICASONE PROPIONATE 50 MCG/ACT NA SUSP: 2.0000 | Freq: Every day | NASAL | 0 refills | Status: DC

## 2020-10-12 MED ORDER — AMOXICILLIN-POT CLAVULANATE 875-125 MG PO TABS: 1.0000 | ORAL_TABLET | Freq: Two times a day (BID) | ORAL | 0 refills | Status: DC

## 2020-10-12 MED ORDER — METHYLPREDNISOLONE 4 MG PO TBPK: ORAL_TABLET | ORAL | 0 refills | Status: DC

## 2020-10-12 NOTE — Assessment & Plan Note (Signed)
She also has some pain in the left arm.  Left wrist is tender.  Will check uric acid level to assess for gout and xray to assess for OA.  In regards to the pain radiating down the left arm, she has know cervical myelopathy.  Will see if she has some improvement in her pain with medrol dose pak.

## 2020-10-12 NOTE — Patient Instructions (Addendum)
Start flonase 2 sprays each nostril daily for nasal drainage. Start zyrtec 10mg  once daily for allergies. Start augmentin 875mg  twice daily for sinus infection.  Please start medrol dose pack for left arm pain (steroid). Complete xray of your wrist on the first floor. Complete labs prior to leaving.

## 2020-10-12 NOTE — Assessment & Plan Note (Signed)
Will refer to sleep medicine for further OSA evaluation.

## 2020-10-12 NOTE — Progress Notes (Signed)
Subjective:    Patient ID: Briana Miller, female    DOB: 09-17-1955, 65 y.o.   MRN: [id number]  HPI  Patient is a 65 yr old female who presents today for follow up.  Nasal congestion/post nasal drip- This has been present for "weeks". Has tried benadryl without improvement. Has tried some otc sinus/allergy/decongestants which did not help.  Reports some frontal HA and bilateral cheek pressure.  Nasal drainage is "cloudy looking."   Fatigue- c/o increased fatigue over the last 1 month. She is napping more.  She does snore.    Left hand pain-Reports + left wrist pain.  Hurts "to hold on to things." Denies injury.  Pain started on Monday.     Review of Systems See HPI  Past Medical History:  Diagnosis Date  . Acute systolic congestive heart failure (HCC) 03/01/2014  . Allergy   . Anemia   . Arthritis   . Cardiomyopathy (HCC)   . Cervical myelopathy (HCC)   . CHF (congestive heart failure) (HCC)   . GERD (gastroesophageal reflux disease)   . Heart disease   . Hyperlipidemia   . Hypertension   . Leg pain   . Overactive bladder      Social History   Socioeconomic History  . Marital status: Married    Spouse name: Not on file  . Number of children: 2  . Years of education: HS  . Highest education level: Not on file  Occupational History  . 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 Smoking status: Never Smoker  . Smokeless tobacco: Never Used  Substance and Sexual Activity  . Alcohol use: Not Currently    Alcohol/week: 2.0 standard drinks    Types: 2 Glasses of wine per week  . Drug use: No  . Sexual activity: Not on file  Other Topics Concern  . 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   1-2 cups caffeine daily.   Lives at home with her husband.   Social Determinants of Health   Financial Resource Strain: Not on file  Food Insecurity: Not on file  Transportation Needs: Not on file  Physical Activity: Not on file  Stress: Not on file  Social Connections: Not on file  Intimate Partner Violence: Not on file    Past Surgical History:  Procedure Laterality Date  . ANTERIOR CERVICAL DECOMP/DISCECTOMY FUSION N/A 08/03/2016   Procedure: ANTERIOR CERVICAL DECOMPRESSION/DISCECTOMY FUSION C4-5, C5-6;  Surgeon: Barnett Abu, MD;  Location: [facility] OR;  Service: Neurosurgery;  Laterality: N/A;  . COLONOSCOPY    . COLONOSCOPY WITH PROPOFOL N/A 02/24/2019   Procedure: COLONOSCOPY WITH PROPOFOL;  Surgeon: Rachael Fee, MD;  Location: [facility];  Service: Endoscopy;  Laterality: N/A;  . KNEE ARTHROSCOPY     2007  . ROTATOR CUFF REPAIR Right 03/23/13  . TOTAL KNEE ARTHROPLASTY  09/22/2011   Procedure: TOTAL KNEE ARTHROPLASTY;  Surgeon: Raymon Mutton, MD;  Location: [facility] OR;  Service: Orthopedics;  Laterality: Right;    Family History  Problem Relation Age of Onset  . Cancer Mother        colon 9 and breast 75- deceased  .  Colon cancer Mother   . Diabetes Father        living  . Asthma Father   . Hypertension Father   . Heart attack Father 59       medical management per pt  . Glaucoma Father        had eye transplant  . Kidney failure Father        acute renal failure- died 61  . Esophageal cancer Neg Hx   . Stomach cancer Neg Hx   . Rectal cancer Neg Hx     Allergies  Allergen Reactions  . Entresto [Sacubitril-Valsartan]     "Dry mouth"    Current Outpatient Medications on File Prior to Visit  Medication Sig Dispense Refill  . AMBULATORY NON FORMULARY MEDICATION GI cocktail: 90 ml viscous lidocaine , 90 ml 10 mg /5 ml dicyclomine , 270 ml maalox  Take 5-10 ml once every 4-6 hours. 450 mL 0  . Cholecalciferol (VITAMIN D3 PO) Take 1  tablet by mouth daily.     Marland Kitchen COVID-19 mRNA vaccine, Pfizer, 30 MCG/0.3ML injection INJECT AS DIRECTED .3 mL 0  . famotidine (PEPCID) 40 MG tablet Take 1 tablet (40 mg total) by mouth 2 (two) times daily. 60 tablet 5  . gabapentin (NEURONTIN) 300 MG capsule Take 3 capsules (900 mg total) by mouth 3 (three) times daily. 810 capsule 1  . latanoprost (XALATAN) 0.005 % ophthalmic solution Place 1 drop into both eyes at bedtime.   11  . Melatonin 3 MG TABS Take 1 tablet by mouth as needed.     . nortriptyline (PAMELOR) 25 MG capsule TAKE 2 CAPSULES BY MOUTH AT BEDTIME 60 capsule 0  . NYAMYC powder APPLY TO AFFECTED AREA(S)  TOPICALLY TWICE DAILY 180 g 1  . pantoprazole (PROTONIX) 40 MG tablet Take 1 tablet (40 mg total) by mouth 2 (two) times daily. 180 tablet 3  . potassium chloride SA (KLOR-CON) 20 MEQ tablet Take 1 tablet (20 mEq total) by mouth daily. 90 tablet 1  . sacubitril-valsartan (ENTRESTO) 24-26 MG Take 1 tablet by mouth 2 (two) times daily. 60 tablet 6  . timolol (BETIMOL) 0.5 % ophthalmic solution Place 1 drop into both eyes every morning.     . torsemide (DEMADEX) 20 MG tablet Take two tablets alternating with one tablet every other day 270 tablet 3  . traMADol (ULTRAM) 50 MG tablet TAKE 1 TABLET BY MOUTH EVERY 8 HOURS AS NEEDED 15 tablet 0  . carvedilol (COREG) 12.5 MG tablet Take 1 tablet (12.5 mg total) by mouth 2 (two) times daily. 180 tablet 4  . spironolactone (ALDACTONE) 25 MG tablet Take 1 tablet (25 mg total) by mouth daily. 90 tablet 3   No current facility-administered medications on file prior to visit.    BP 115/80 (BP Location: Left Arm, Patient Position: Sitting, Cuff Size: Large)   Pulse 83   Temp 98 F (36.7 C) (Oral)   Resp 16   LMP 06/17/2007   SpO2 100%       Objective:   Physical Exam Constitutional:      Appearance: She is well-developed.  Neck:     Thyroid: No thyromegaly.  Cardiovascular:     Rate and Rhythm: Normal rate and regular rhythm.      Heart sounds: Normal heart sounds. No murmur heard.   Pulmonary:     Effort: Pulmonary effort is normal. No respiratory distress.     Breath sounds: Normal breath sounds. No wheezing.  Musculoskeletal:  Cervical back: Neck supple.  Skin:    General: Skin is warm and dry.  Neurological:     Mental Status: She is alert and oriented to person, place, and time.     Comments: Bilateral UE strenght is 4-5/5.  Decreased hand grasps bilaterally  Psychiatric:        Behavior: Behavior normal.        Thought Content: Thought content normal.        Judgment: Judgment normal.           Assessment & Plan:

## 2020-10-12 NOTE — Assessment & Plan Note (Signed)
New.  Suspect that this originated from her uncontrolled allergic rhinitis. Pt is advised as follows:  Start flonase 2 sprays each nostril daily for nasal drainage. Start zyrtec 10mg  once daily for allergies. Start augmentin 875mg  twice daily for sinus infection.

## 2020-10-13 LAB — URIC ACID: Uric Acid, Serum: 10.6 mg/dL — ABNORMAL HIGH (ref 2.5–7.0)

## 2020-10-14 ENCOUNTER — Encounter: Payer: Self-pay | Admitting: Family

## 2020-10-14 ENCOUNTER — Telehealth: Payer: Self-pay | Admitting: Family

## 2020-10-14 DIAGNOSIS — M109 Gout, unspecified: Secondary | ICD-10-CM | POA: Insufficient documentation

## 2020-10-14 MED ORDER — COLCHICINE 0.6 MG PO TABS: ORAL_TABLET | ORAL | 0 refills | Status: DC

## 2020-10-14 NOTE — Telephone Encounter (Signed)
Lab work suggests that her wrist pain is likely gout.  I am sending an rx for colchicine. It may cause brief diarrhea. If pain is not improved in 24 hrs she can repeat dosing. She should not take carvedilol on the days that she is taking colchicine due to possible drug interaction.

## 2020-10-15 LAB — HEMOGLOBIN A1C: Hgb A1c MFr Bld: 6.8 % — ABNORMAL HIGH (ref 4.6–6.5)

## 2020-10-15 NOTE — Telephone Encounter (Signed)
Patient advised of results, new medication, side effect and possible interaction. She verbalized understanding.

## 2020-10-15 NOTE — Addendum Note (Signed)
Addended by: Mervin Kung A on: 10/15/2020 10:05 AM   Modules accepted: Orders

## 2020-10-18 DIAGNOSIS — G8220 Paraplegia, unspecified: Secondary | ICD-10-CM | POA: Diagnosis not present

## 2020-10-18 DIAGNOSIS — G959 Disease of spinal cord, unspecified: Secondary | ICD-10-CM | POA: Diagnosis not present

## 2020-10-19 ENCOUNTER — Telehealth: Payer: Medicare Other | Admitting: Family

## 2020-10-26 ENCOUNTER — Telehealth: Payer: Self-pay | Admitting: Family

## 2020-10-26 ENCOUNTER — Other Ambulatory Visit: Payer: Self-pay

## 2020-10-26 MED ORDER — ALLOPURINOL 100 MG PO TABS: 100.0000 mg | ORAL_TABLET | Freq: Every day | ORAL | 1 refills | Status: DC

## 2020-10-26 MED ORDER — COLCHICINE 0.6 MG PO TABS: ORAL_TABLET | ORAL | 0 refills | Status: DC

## 2020-10-26 NOTE — Telephone Encounter (Signed)
Patient states that she is having a Gout Flare up in her foot. She states it's swollen very painful and she can't put a shoe on. She wants to know if Efraim Kaufmann can call in a steroid for something for  Relief. Please Advise

## 2020-10-26 NOTE — Telephone Encounter (Signed)
Patient advised ok per provider will give her a rx for Colchicine for the flare up and a rx for daily Allopurinol for maintenance of uric acid levele.

## 2020-10-28 ENCOUNTER — Encounter: Payer: Self-pay | Admitting: Family

## 2020-10-28 ENCOUNTER — Other Ambulatory Visit: Payer: Self-pay | Admitting: Physician Assistant

## 2020-10-30 ENCOUNTER — Other Ambulatory Visit: Payer: Self-pay | Admitting: Family

## 2020-11-18 DIAGNOSIS — G8220 Paraplegia, unspecified: Secondary | ICD-10-CM | POA: Diagnosis not present

## 2020-11-18 DIAGNOSIS — G959 Disease of spinal cord, unspecified: Secondary | ICD-10-CM | POA: Diagnosis not present

## 2020-11-30 ENCOUNTER — Other Ambulatory Visit: Payer: Self-pay

## 2020-11-30 ENCOUNTER — Ambulatory Visit (HOSPITAL_COMMUNITY)
Admission: RE | Admit: 2020-11-30 | Discharge: 2020-11-30 | Disposition: A | Discharge status: Home / Self Care | Payer: Medicare Other | Source: Ambulatory Visit | Attending: Cardiology | Admitting: Cardiology

## 2020-11-30 DIAGNOSIS — G4730 Sleep apnea, unspecified: Secondary | ICD-10-CM | POA: Diagnosis not present

## 2020-11-30 DIAGNOSIS — I428 Other cardiomyopathies: Secondary | ICD-10-CM | POA: Diagnosis not present

## 2020-11-30 DIAGNOSIS — E785 Hyperlipidemia, unspecified: Secondary | ICD-10-CM | POA: Insufficient documentation

## 2020-11-30 DIAGNOSIS — I110 Hypertensive heart disease with heart failure: Secondary | ICD-10-CM | POA: Insufficient documentation

## 2020-11-30 DIAGNOSIS — I509 Heart failure, unspecified: Secondary | ICD-10-CM | POA: Diagnosis not present

## 2020-11-30 DIAGNOSIS — I340 Nonrheumatic mitral (valve) insufficiency: Secondary | ICD-10-CM | POA: Insufficient documentation

## 2020-11-30 LAB — ECHOCARDIOGRAM COMPLETE
Area-P 1/2: 3.91 cm2
S' Lateral: 5 cm

## 2020-11-30 NOTE — Progress Notes (Signed)
  Echocardiogram 2D Echocardiogram with 3D and strain has been performed.  Leta Jungling M 11/30/2020, 11:55 AM

## 2020-12-03 ENCOUNTER — Telehealth: Payer: Self-pay | Admitting: *Deleted

## 2020-12-03 DIAGNOSIS — I428 Other cardiomyopathies: Secondary | ICD-10-CM

## 2020-12-03 NOTE — Telephone Encounter (Addendum)
-----   Message from Lewayne Bunting, MD sent at 11/30/2020  4:47 PM EDT ----- Schedule cardiac MRI to R/O infiltrative CM Olga Millers    pt aware of results  Order placed

## 2020-12-13 ENCOUNTER — Other Ambulatory Visit: Payer: Self-pay | Admitting: Physician Assistant

## 2020-12-19 ENCOUNTER — Telehealth: Payer: Self-pay | Admitting: Cardiology

## 2020-12-19 NOTE — Telephone Encounter (Signed)
Pt is calling to confirm when her appt is going to be scheduled for the MRI

## 2020-12-19 NOTE — Telephone Encounter (Signed)
LVM with pt telling her scheduling will be messaged and they should be following back up with her to schedule. She may call the office back if she has any additional questions.

## 2020-12-20 NOTE — Telephone Encounter (Signed)
Spoke with pt, aware cardiac MRI is scheduled for 01/24/21 @ 12 noon.

## 2021-01-03 ENCOUNTER — Other Ambulatory Visit: Payer: Self-pay | Admitting: Family

## 2021-01-04 DIAGNOSIS — M5416 Radiculopathy, lumbar region: Secondary | ICD-10-CM | POA: Diagnosis not present

## 2021-01-04 DIAGNOSIS — I10 Essential (primary) hypertension: Secondary | ICD-10-CM | POA: Diagnosis not present

## 2021-01-04 DIAGNOSIS — G959 Disease of spinal cord, unspecified: Secondary | ICD-10-CM | POA: Diagnosis not present

## 2021-01-08 ENCOUNTER — Other Ambulatory Visit: Payer: Self-pay | Admitting: Neurological Surgery

## 2021-01-08 DIAGNOSIS — M5416 Radiculopathy, lumbar region: Secondary | ICD-10-CM

## 2021-01-09 NOTE — Progress Notes (Signed)
HPI- F never smoker referred last year for evaluation of cough and suspected OSA. Complicated by Migraine, HTN, CHF/ dilated CM, Allergic Rhinitis, GERD, Cervical Myelopathy, Osteoarthritis, Morbid Obesity, Chronic Pain, Borderline Diabetes, Glaucoma, Disability (Hasn't stood up since 2017),   PFT and Sleep study were ordered but not done.  =====================================================================  02/02/20- 64 yoF never smoker referred for evaluation of cough and suspected OSA by Sandford Craze, NP Medical problem list includes Migraine, HTN, CHF/ dilated CM, Allergic Rhinitis, GERD, Cervical Myelopathy, Osteoarthritis, Morbid Obesity, Chronic Pain, Borderline Diabetes, Glaucoma, Body weight today patient reported 240 lbs- unable to stand from wheelchair Epworth score 15 She was seen by Dr Vassie Loll for same issues in 2015. OSA suspected but sleep study not done. Cough then attributed to lisinopril. Sleep- Was to have had more recent sleep study per cardiology, prevented by Covid restrictions. Admits loud snoring. She isn't clear about what husband notices. She lies in bed watching TV or facebook, wakes during night to do the same. Melatonin, 1 cup AM coffee.  Cough- Has noticed about a year and a half. Unaware of triggers. Wakes coughing occ at night, productive white. Less cough in day. Sleeps propped up on pillows. Admits some cough occ with swallowing. On medication for GERD. Modest benefit from otc cough syrups. Remote hx asthma, last tried an inhaler 2 yrs ago. Not aware of wheeze. She is not on an ACEI, but takes Timolol eye drops. On Gabapentin, which might help upper airway cough syndrome. Some postnasal drip, apparently mild. Known CHF- not clear if impacts breathing.  Wheelchair dependent and on total disability. Hx C-spine fusion, pending TKR. Had 2 Phizer Covax.  CT coronary morph 01/20/20-  Limited view of the lung parenchyma demonstrates no suspicious nodularity. Airways  are normal. Limited view of the mediastinum demonstrates no adenopathy. Esophagus normal. Limited view of the upper abdomen unremarkable. Limited view of the skeleton and chest wall is unremarkable. IMPRESSION: No significant extracardiac findings.  ECHO 12/16/19- EF 30-35%, Global hypokinesis, no PAH, Gr1 DD  01/10/21- 65 yoF never smoker referred last year for evaluation of cough and suspected OSA. Complicated by Migraine, HTN, CHF/ dilated CM, Allergic Rhinitis, GERD, Cervical Myelopathy, Osteoarthritis, Morbid Obesity, Chronic Pain, Borderline Diabetes, Glaucoma, Disability (Hasn't stood up since 2017),   PFT and Sleep study were ordered but not done. CXR 02/02/20-  IMPRESSION: Increased interstitial prominence since 2018. This may reflect chronic interstitial changes or mild edema. Covid vax- 3 Phizer -----Chronic cough, occ productive Increased cough  over past month- she questions if related to her CHF. White phlegm. Discussed interstitial prominence on CXR- possibly edema vs ILD.  Note also Timolol beta-blocker. Note also GERD- no heartburn on meds.   ROS-see HPI   + = positive Constitutional:    weight loss, night sweats, fevers, chills,+ fatigue, lassitude. HEENT:    headaches, difficulty swallowing, tooth/dental problems, sore throat,       sneezing, itching, ear ache, nasal congestion, post nasal drip, snoring CV:    chest pain, orthopnea, PND, swelling in lower extremities, anasarca,                                   dizziness, palpitations Resp:   shortness of breath with exertion or at rest.                +productive cough,   non-productive cough, coughing up of blood.  change in color of mucus.  wheezing.   Skin:    rash or lesions. GI:  No-   heartburn, indigestion, abdominal pain, nausea, vomiting, diarrhea,                 change in bowel habits, loss of appetite GU: dysuria, change in color of urine, no urgency or frequency.   flank pain. MS:  + joint  pain, stiffness, decreased range of motion, back pain. Neuro-     nothing unusual Psych:  change in mood or affect.  depression or anxiety.   memory loss.  OBJ- Physical Exam General- Alert, Oriented, Affect-appropriate, Distress- none acute, +Morbidly Obese, + wheelchair Skin- rash-none, lesions- none, excoriation- none Lymphadenopathy- none Head- atraumatic            Eyes- Gross vision intact, PERRLA, conjunctivae and secretions clear            Ears- Hearing, canals-normal            Nose- Clear, no-Septal dev, mucus, polyps, erosion, perforation             Throat- Mallampati III , mucosa clear , drainage- none, tonsils+ present Neck- flexible , trachea midline, no stridor , thyroid nl, carotid no bruit Chest - symmetrical excursion , unlabored           Heart/CV- RRR , no murmur , no gallop  , no rub, nl s1 s2                           - JVD- none , edema- none, stasis changes- none, varices- none           Lung- clear to P&A, wheeze- none, cough- none , dullness-none, rub- none           Chest wall-  Abd-  Br/ Gen/ Rectal- Not done, not indicated Extrem- +elastic hose Neuro- grossly intact to observation

## 2021-01-10 ENCOUNTER — Ambulatory Visit: Payer: Medicare Other | Admitting: Internal Medicine

## 2021-01-10 ENCOUNTER — Encounter: Payer: Self-pay | Admitting: Internal Medicine

## 2021-01-10 ENCOUNTER — Other Ambulatory Visit: Payer: Self-pay

## 2021-01-10 DIAGNOSIS — G4733 Obstructive sleep apnea (adult) (pediatric): Secondary | ICD-10-CM | POA: Diagnosis not present

## 2021-01-10 DIAGNOSIS — R059 Cough, unspecified: Secondary | ICD-10-CM

## 2021-01-10 MED ORDER — TRELEGY ELLIPTA 100-62.5-25 MCG/INH IN AEPB: 1.0000 | INHALATION_SPRAY | Freq: Every day | RESPIRATORY_TRACT | 0 refills | Status: DC

## 2021-01-10 MED ORDER — BENZONATATE 200 MG PO CAPS: 200.0000 mg | ORAL_CAPSULE | Freq: Three times a day (TID) | ORAL | 1 refills | Status: DC | PRN

## 2021-01-10 NOTE — Patient Instructions (Addendum)
Order - HRCT chest    dx ILD, chronic cough  Script sent- tessalon perles to use three times daily if needed for cough   ([facility] [place])  Order- sample Trelegy 100 inhaler   inhale 1 puff, then rinse mouth, once daily  Use this up and see if it helps your cough  Please call if we can help

## 2021-01-12 NOTE — Assessment & Plan Note (Signed)
She hasn't followed through with sleep study orders

## 2021-01-12 NOTE — Assessment & Plan Note (Signed)
Likely multifactorial- diastolic CHF, GERD, Timolol eye drops. ILD wasn't noted on CT in 2021, so we are probably seeing interstitial edema.  Plan- HRCT chest, sample Trelegy inhaler. Benzonatate. Reflux precautions

## 2021-01-14 ENCOUNTER — Other Ambulatory Visit: Payer: Self-pay | Admitting: Physician Assistant

## 2021-01-14 ENCOUNTER — Other Ambulatory Visit: Payer: Self-pay | Admitting: Family

## 2021-01-18 ENCOUNTER — Ambulatory Visit: Payer: Medicare Other | Admitting: Neurology

## 2021-01-21 ENCOUNTER — Telehealth: Payer: Self-pay | Admitting: Internal Medicine

## 2021-01-21 MED ORDER — BREZTRI AEROSPHERE 160-9-4.8 MCG/ACT IN AERO: 2.0000 | INHALATION_SPRAY | Freq: Two times a day (BID) | RESPIRATORY_TRACT | 0 refills | Status: DC

## 2021-01-21 MED ORDER — DOXYCYCLINE HYCLATE 100 MG PO TABS
100.0000 mg | ORAL_TABLET | Freq: Two times a day (BID) | ORAL | 0 refills | Status: DC
Start: 2021-01-21 — End: 2021-02-15

## 2021-01-21 NOTE — Telephone Encounter (Signed)
Pt states she saw CY and he gave pt Trelegy sample and tessalon perrles. Pt states Trelegy is too expensive. Pt states she'd like another rx for tessalon perrles and different inhaler sent into mail order pharmacy. Please advise [phone] Pharmacy-[facility] mail order pharmacy

## 2021-01-21 NOTE — Telephone Encounter (Signed)
Called and spoke with Patient.  Dr. Maple Hudson recommendations given.  Understanding stated.  Doxycycline prescription sent to requested [facility]. Breztri samples placed at front desk for Patient to come pick up.  Nothing further at this time.

## 2021-01-21 NOTE — Telephone Encounter (Signed)
If we have 2 samples of Breztri that she can try (inhale 2 puffs, then rinse mouth, twice daily) then let's let her have those and she can check price at drug store  Please also offer doxycycline 100 mg, # 14, 1 twice daily

## 2021-01-21 NOTE — Telephone Encounter (Signed)
Call returned to patient, confirmed DOB. She has finished tessalon but still having cough with green to yellow phlegm. She has tried OTC robitussin which she did not feel was effective for her cough. She reports the trelegy is more than $500 which she cannot afford. I made the patient aware she will need to call her insurance and find out which inhalers are covered. I made her aware we would check with provider to see if there was another inhaler comparable that we could give her in the mean time and we could also get her a patient assistance application for the trelegy. Patient to contact insurance and get back with [place].   CY please advise if there is another inhaler you would like patient to have to hold her over until she is able to contact insurance and pick up patient assistance application.

## 2021-01-24 ENCOUNTER — Ambulatory Visit (HOSPITAL_COMMUNITY): Payer: Medicare Other

## 2021-01-25 ENCOUNTER — Ambulatory Visit: Payer: Medicare Other | Admitting: Cardiology

## 2021-01-30 ENCOUNTER — Telehealth: Payer: Self-pay | Admitting: Family

## 2021-01-30 NOTE — Telephone Encounter (Signed)
Left message for patient to call back and schedule Medicare Annual Wellness Visit (AWV) in office.   If not able to come in office, please offer to do virtually or by telephone.  Left office number and my jabber #[phone].  Due for AWVI  Please schedule at anytime with Nurse Health Advisor.   

## 2021-01-31 ENCOUNTER — Telehealth: Payer: Self-pay | Admitting: Family

## 2021-01-31 MED ORDER — PREDNISONE 10 MG PO TABS: ORAL_TABLET | ORAL | 0 refills | Status: DC

## 2021-01-31 NOTE — Telephone Encounter (Signed)
Pt is requested an rx for her gout in right foot, states she is in severe pain with it and needing something for pain. Please send to [facility] [address]

## 2021-02-04 ENCOUNTER — Ambulatory Visit: Payer: Medicare Other | Admitting: Cardiology

## 2021-02-13 ENCOUNTER — Other Ambulatory Visit: Payer: Self-pay | Admitting: Cardiology

## 2021-02-13 DIAGNOSIS — I5042 Chronic combined systolic (congestive) and diastolic (congestive) heart failure: Secondary | ICD-10-CM

## 2021-02-13 DIAGNOSIS — R601 Generalized edema: Secondary | ICD-10-CM

## 2021-02-15 ENCOUNTER — Ambulatory Visit (INDEPENDENT_AMBULATORY_CARE_PROVIDER_SITE_OTHER): Payer: Medicare Other | Admitting: Family

## 2021-02-15 ENCOUNTER — Other Ambulatory Visit: Payer: Self-pay

## 2021-02-15 VITALS — BP 128/82 | HR 80 | Temp 98.1°F | Resp 17

## 2021-02-15 DIAGNOSIS — K219 Gastro-esophageal reflux disease without esophagitis: Secondary | ICD-10-CM

## 2021-02-15 DIAGNOSIS — R5383 Other fatigue: Secondary | ICD-10-CM | POA: Diagnosis not present

## 2021-02-15 DIAGNOSIS — M21619 Bunion of unspecified foot: Secondary | ICD-10-CM | POA: Diagnosis not present

## 2021-02-15 DIAGNOSIS — Z23 Encounter for immunization: Secondary | ICD-10-CM | POA: Diagnosis not present

## 2021-02-15 DIAGNOSIS — L304 Erythema intertrigo: Secondary | ICD-10-CM | POA: Diagnosis not present

## 2021-02-15 LAB — COMPREHENSIVE METABOLIC PANEL
ALT: 16 U/L (ref 0–35)
AST: 16 U/L (ref 0–37)
Albumin: 4.1 g/dL (ref 3.5–5.2)
Alkaline Phosphatase: 96 U/L (ref 39–117)
BUN: 19 mg/dL (ref 6–23)
CO2: 30 mEq/L (ref 19–32)
Calcium: 9.6 mg/dL (ref 8.4–10.5)
Chloride: 102 mEq/L (ref 96–112)
Creatinine, Ser: 1.22 mg/dL — ABNORMAL HIGH (ref 0.40–1.20)
GFR: 47.35 mL/min — ABNORMAL LOW (ref >60.00)
Glucose, Bld: 111 mg/dL — ABNORMAL HIGH (ref 70–99)
Potassium: 4.4 mEq/L (ref 3.5–5.1)
Sodium: 140 mEq/L (ref 135–145)
Total Bilirubin: 0.6 mg/dL (ref 0.2–1.2)
Total Protein: 7.4 g/dL (ref 6.0–8.3)

## 2021-02-15 LAB — CBC WITH DIFFERENTIAL/PLATELET
Basophils Absolute: 0 10*3/uL (ref 0.0–0.1)
Basophils Relative: 0.6 % (ref 0.0–3.0)
Eosinophils Absolute: 0.2 10*3/uL (ref 0.0–0.7)
Eosinophils Relative: 3.5 % (ref 0.0–5.0)
HCT: 36.5 % (ref 36.0–46.0)
Hemoglobin: 12.1 g/dL (ref 12.0–15.0)
Lymphocytes Relative: 26.9 % (ref 12.0–46.0)
Lymphs Abs: 1.4 10*3/uL (ref 0.7–4.0)
MCHC: 33.1 g/dL (ref 30.0–36.0)
MCV: 83.1 fl (ref 78.0–100.0)
Monocytes Absolute: 0.7 10*3/uL (ref 0.1–1.0)
Monocytes Relative: 14 % — ABNORMAL HIGH (ref 3.0–12.0)
Neutro Abs: 2.8 10*3/uL (ref 1.4–7.7)
Neutrophils Relative %: 55 % (ref 43.0–77.0)
Platelets: 264 10*3/uL (ref 150.0–400.0)
RBC: 4.4 Mil/uL (ref 3.87–5.11)
RDW: 15.3 % (ref 11.5–15.5)
WBC: 5 10*3/uL (ref 4.0–10.5)

## 2021-02-15 LAB — TSH: TSH: 1.97 u[IU]/mL (ref 0.35–5.50)

## 2021-02-15 MED ORDER — AMOXICILLIN 500 MG PO CAPS: 2000.0000 mg | ORAL_CAPSULE | Freq: Once | ORAL | 0 refills | Status: AC

## 2021-02-15 MED ORDER — BENZONATATE 100 MG PO CAPS: 100.0000 mg | ORAL_CAPSULE | Freq: Two times a day (BID) | ORAL | 0 refills | Status: DC | PRN

## 2021-02-15 MED ORDER — FAMOTIDINE 40 MG PO TABS: 40.0000 mg | ORAL_TABLET | Freq: Two times a day (BID) | ORAL | 1 refills | Status: DC

## 2021-02-15 MED ORDER — NYSTATIN 100000 UNIT/GM EX POWD: 1.0000 "application " | Freq: Three times a day (TID) | CUTANEOUS | 2 refills | Status: DC

## 2021-02-15 NOTE — Patient Instructions (Signed)
Please complete lab work prior to leaving.  Please schedule your sleep study.

## 2021-02-15 NOTE — Progress Notes (Signed)
Subjective:   By signing my name below, I, Shehryar Baig, attest that this documentation has been prepared under the direction and in the presence of Debbrah Alar NP. 02/15/2021   Patient ID: Briana Miller, female    DOB: Dec 02, 1955, 65 y.o.   MRN: [id number]  Chief Complaint  Patient presents with   Fatigue    Increased fatigue, over sleeping, falling asleep during day     HPI Patient is in today for an office visit.  Sleep- She complains of over sleeping. She feels tired easily after sleeping and frequently feels like sleepy through out the day. She takes medication to manage her sleep at night and has no issues falling asleep while taking them. Her other provider recommended she get a sleep study. She is willing to make an appointment for her home. She had gotten an x-ray and found she had fluid in her lungs.  Cough- She complains of cough for the past year. She is requesting to take tessalon Perles to manage her symptoms.  Anti-biotic- She is requesting for anti-biotics prior to her dental appointment.  Nystatin powder- She is requesting a refill for nystatin powder for rash beneath her abdominal fold and breasts.  Pepcid- She is requesting a refill for 40 mg Pepcid daily PO.  Neck pain- She complains of occasional pain in the back of her neck since her past surgery in her neck.  Foot- She report having a bunion on her left foot.  Blood sugar- She is requesting to get her blood sugar levels checked. Her father has a history of diabetes.   Lab Results  Component Value Date   HGBA1C 6.8 (H) 10/15/2020   Immunizations- She is interested in getting a flu vaccine during this visit. Dentist- She has an upcoming appointment scheduled this month.    Health Maintenance Due  Topic Date Due   Pneumococcal Vaccine 69-72 Years old (1 - PCV) Never done   Zoster Vaccines- Shingrix (1 of 2) Never done   PAP SMEAR-Modifier  03/22/2018   COVID-19 Vaccine (4 - Booster)  09/27/2020    Past Medical History:  Diagnosis Date   Acute systolic congestive heart failure (HCC) 03/01/2014   Allergy    Anemia    Arthritis    Cardiomyopathy ([place])    Cervical myelopathy (HCC)    CHF (congestive heart failure) (HCC)    GERD (gastroesophageal reflux disease)    Gout    Heart disease    Hyperlipidemia    Hypertension    Leg pain    Overactive bladder     Past Surgical History:  Procedure Laterality Date   ANTERIOR CERVICAL DECOMP/DISCECTOMY FUSION N/A 08/03/2016   Procedure: ANTERIOR CERVICAL DECOMPRESSION/DISCECTOMY FUSION C4-5, C5-6;  Surgeon: Kristeen Miss, MD;  Location: [facility];  Service: Neurosurgery;  Laterality: N/A;   COLONOSCOPY     COLONOSCOPY WITH PROPOFOL N/A 02/24/2019   Procedure: COLONOSCOPY WITH PROPOFOL;  Surgeon: Milus Banister, MD;  Location: [facility];  Service: Endoscopy;  Laterality: N/A;   KNEE ARTHROSCOPY     2007   ROTATOR CUFF REPAIR Right 03/23/13   TOTAL KNEE ARTHROPLASTY  09/22/2011   Procedure: TOTAL KNEE ARTHROPLASTY;  Surgeon: Rudean Haskell, MD;  Location: [facility];  Service: Orthopedics;  Laterality: Right;    Family History  Problem Relation Age of Onset   Cancer Mother        colon 87 and breast 70- deceased   Colon cancer Mother    Diabetes Father  living   Asthma Father    Hypertension Father    Heart attack Father 25       medical management per pt   Glaucoma Father        had eye transplant   Kidney failure Father        acute renal failure- died 79   Esophageal cancer Neg Hx    Stomach cancer Neg Hx    Rectal cancer Neg Hx     Social History   Socioeconomic History   Marital status: Married    Spouse name: Not on file   Number of children: 2   Years of education: HS   Highest education level: Not on file  Occupational History   Occupation: Psychiatric nurse: [employer]  Tobacco Use   Smoking status: Never   Smokeless tobacco: Never  Substance and Sexual Activity   Alcohol use: Not  Currently    Alcohol/week: 2.0 standard drinks    Types: 2 Glasses of wine per week   Drug use: No   Sexual activity: Not on file  Other Topics Concern   Not on file  Social History Narrative   Married- 67 years   2 children- grown daughter- lives in [place]- has 2 children   Grown son lives in [place]- 4 children   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   1-2 cups caffeine daily.   Lives at home with her husband.   Social Determinants of Health   Financial Resource Strain: Not on file  Food Insecurity: Not on file  Transportation Needs: Not on file  Physical Activity: Not on file  Stress: Not on file  Social Connections: Not on file  Intimate Partner Violence: Not on file    Outpatient Medications Prior to Visit  Medication Sig Dispense Refill   allopurinol (ZYLOPRIM) 100 MG tablet Take 1 tablet (100 mg total) by mouth daily. 90 tablet 1   Budeson-Glycopyrrol-Formoterol (BREZTRI AEROSPHERE) 160-9-4.8 MCG/ACT AERO Inhale 2 puffs into the lungs in the morning and at bedtime. 5.9 g 0   cetirizine (ZYRTEC) 10 MG tablet Take 1 tablet (10 mg total) by mouth daily. 30 tablet 11   Cholecalciferol (VITAMIN D3 PO) Take 1 tablet by mouth daily.      colchicine 0.6 MG tablet Take 2 tabs by mouth now and 1 tab in 1 hour for gout. May repeat tomorrow if gout pain is not improved. 6 tablet 0   fluticasone (FLONASE) 50 MCG/ACT nasal spray Place 2 sprays into both nostrils daily. 16 g 0   Fluticasone-Umeclidin-Vilant (TRELEGY ELLIPTA) 100-62.5-25 MCG/INH AEPB Inhale 1 puff into the lungs daily. Rinse mouth after 1 each 0   gabapentin (NEURONTIN) 300 MG capsule Take 3 capsules (900 mg total) by mouth 3 (three) times daily. 810 capsule 0   latanoprost (XALATAN) 0.005 % ophthalmic solution Place 1 drop into both eyes at bedtime.   11   Melatonin 3 MG TABS Take 1 tablet by mouth as needed.       nortriptyline (PAMELOR) 25 MG capsule TAKE 2 CAPSULES BY MOUTH AT BEDTIME 60 capsule 0   ondansetron (ZOFRAN) 4 MG tablet TAKE 1 TABLET BY MOUTH  EVERY 4 TO 6 HOURS AS  NEEDED FOR NAUSEA 90 tablet 0   pantoprazole (PROTONIX) 40 MG tablet Take 1 tablet (40 mg total) by mouth 2 (two) times daily.  180 tablet 3   potassium chloride SA (KLOR-CON) 20 MEQ tablet TAKE 1 TABLET BY MOUTH  DAILY 90 tablet 1   spironolactone (ALDACTONE) 25 MG tablet TAKE 1 TABLET BY MOUTH  DAILY 90 tablet 3   timolol (BETIMOL) 0.5 % ophthalmic solution Place 1 drop into both eyes every morning.      torsemide (DEMADEX) 20 MG tablet TAKE 2 TABLETS BY MOUTH  ALTERNATING WITH 1 TABLET  EVERY OTHER DAY 135 tablet 3   famotidine (PEPCID) 40 MG tablet Take 1 tablet (40 mg total) by mouth 2 (two) times daily. 60 tablet 5   NYAMYC powder APPLY TO AFFECTED AREA(S)  TOPICALLY TWICE DAILY 180 g 1   carvedilol (COREG) 12.5 MG tablet Take 1 tablet (12.5 mg total) by mouth 2 (two) times daily. 180 tablet 4   AMBULATORY NON FORMULARY MEDICATION GI cocktail: 90 ml viscous lidocaine , 90 ml 10 mg /5 ml dicyclomine , 270 ml maalox  Take 5-10 ml once every 4-6 hours. 450 mL 0   amoxicillin-clavulanate (AUGMENTIN) 875-125 MG tablet Take 1 tablet by mouth 2 (two) times daily. 20 tablet 0   benzonatate (TESSALON) 200 MG capsule Take 1 capsule (200 mg total) by mouth 3 (three) times daily as needed for cough. 30 capsule 1   COVID-19 mRNA vaccine, Pfizer, 30 MCG/0.3ML injection INJECT AS DIRECTED .3 mL 0   doxycycline (VIBRA-TABS) 100 MG tablet Take 1 tablet (100 mg total) by mouth 2 (two) times daily. 14 tablet 0   predniSONE (DELTASONE) 10 MG tablet Take 4 tablets by mouth once daily fo 20 tablet 0   sacubitril-valsartan (ENTRESTO) 24-26 MG Take 1 tablet by mouth 2 (two) times daily. 60 tablet 6   No facility-administered medications prior to visit.    Allergies  Allergen Reactions   Entresto [Sacubitril-Valsartan]     "Dry mouth"     Review of Systems  Constitutional:  Positive for malaise/fatigue.  HENT:  Positive for congestion.   Respiratory:  Positive for cough.   Musculoskeletal:  Positive for neck pain.  Skin:        (+)bunion on left foot      Objective:    Physical Exam Constitutional:      General: She is not in acute distress.    Appearance: Normal appearance. She is not ill-appearing.  HENT:     Head: Normocephalic and atraumatic.     Right Ear: External ear normal.     Left Ear: External ear normal.  Eyes:     Extraocular Movements: Extraocular movements intact.     Pupils: Pupils are equal, round, and reactive to light.  Cardiovascular:     Rate and Rhythm: Normal rate and regular rhythm.     Heart sounds: Normal heart sounds. No murmur heard.   No gallop.  Pulmonary:     Effort: Pulmonary effort is normal. No respiratory distress.     Breath sounds: Normal breath sounds. No wheezing or rales.  Skin:    General: Skin is warm and dry.  Neurological:     Mental Status: She is alert and oriented to person, place, and time.  Psychiatric:        Behavior: Behavior normal.        Judgment: Judgment normal.    BP 128/82 (BP Location: Right Arm, Patient Position: Sitting, Cuff Size: Large)   Pulse 80   Temp 98.1 F (36.7 C)   Resp 17   LMP 06/17/2007   SpO2 98%  Wt  Readings from Last 3 Encounters:  04/12/20 220 lb (99.8 kg)  02/02/20 240 lb (108.9 kg)  01/09/20 (!) 220 lb (99.8 kg)       Assessment & Plan:   Problem List Items Addressed This Visit       Unprioritized   GERD (gastroesophageal reflux disease)    Stable on pepcid 3m once daily. Continue same.       Relevant Medications   famotidine (PEPCID) 40 MG tablet   Fatigue - Primary    She has not followed through with recommended sleep study. I really think her daytime somnolence is due to untreated sleep apnea.  I discussed this with her and encouraged her to follow through with scheduling sleep study.        Relevant Orders   Comp Met (CMET) (Completed)   TSH (Completed)   CBC with Differential/Platelet (Completed)   Bunion    Discussed treatment is surgery- she is not interested in surgery at this time.       Other Visit Diagnoses     Immunization due       Relevant Orders   Flu Vaccine QUAD 6+ mos PF IM (Fluarix Quad PF) (Completed)   Intertrigo            Meds ordered this encounter  Medications   amoxicillin (AMOXIL) 500 MG capsule    Sig: Take 4 capsules (2,000 mg total) by mouth once for 1 dose. Prior to dental procedure.    Dispense:  4 capsule    Refill:  0    Order Specific Question:   Supervising Provider    Answer:   BPenni HomansA [[id number]]   DISCONTD: benzonatate (TESSALON) 100 MG capsule    Sig: Take 1 capsule (100 mg total) by mouth 2 (two) times daily as needed for cough.    Dispense:  20 capsule    Refill:  0    Order Specific Question:   Supervising Provider    Answer:   BPenni HomansA [[id number]]   benzonatate (TESSALON) 100 MG capsule    Sig: Take 1 capsule (100 mg total) by mouth 2 (two) times daily as needed for cough.    Dispense:  20 capsule    Refill:  0    Order Specific Question:   Supervising Provider    Answer:   BPenni HomansA [[id number]]   famotidine (PEPCID) 40 MG tablet    Sig: Take 1 tablet (40 mg total) by mouth 2 (two) times daily.    Dispense:  180 tablet    Refill:  1    Order Specific Question:   Supervising Provider    Answer:   BPenni HomansA [[id number]]   nystatin (MYCOSTATIN/NYSTOP) powder    Sig: Apply 1 application topically 3 (three) times daily.    Dispense:  60 g    Refill:  2    Order Specific Question:   Supervising Provider    Answer:   BPenni HomansA [[id number]]    I, MDebbrah AlarNP, personally preformed the services described in this documentation.  All medical record entries made by the scribe were at my direction and in my presence.  I have reviewed the chart and discharge instructions (if applicable) and agree that the  record reflects my personal performance and is accurate and complete. 02/15/2021   I,Shehryar Baig,acting as a scribe for MNance Pear NP.,have documented all relevant documentation on the behalf of MNance Pear NP,as directed by  MWellington Hampshire  Inda Castle, NP while in the presence of Nance Pear, NP.    Nance Pear, NP

## 2021-02-18 ENCOUNTER — Telehealth: Payer: Self-pay | Admitting: Family

## 2021-02-18 DIAGNOSIS — M21619 Bunion of unspecified foot: Secondary | ICD-10-CM | POA: Insufficient documentation

## 2021-02-18 DIAGNOSIS — R5383 Other fatigue: Secondary | ICD-10-CM | POA: Insufficient documentation

## 2021-02-18 NOTE — Assessment & Plan Note (Signed)
Stable on pepcid 40mg  once daily. Continue same.

## 2021-02-18 NOTE — Assessment & Plan Note (Signed)
She has not followed through with recommended sleep study. I really think her daytime somnolence is due to untreated sleep apnea.  I discussed this with her and encouraged her to follow through with scheduling sleep study.

## 2021-02-18 NOTE — Assessment & Plan Note (Signed)
Discussed treatment is surgery- she is not interested in surgery at this time.

## 2021-02-18 NOTE — Telephone Encounter (Signed)
I realized that I did not include an A1C (diabetes test) in her lab draw- my apologies.  She can return at her convenience for A1C. No rush.

## 2021-02-20 ENCOUNTER — Other Ambulatory Visit: Payer: Self-pay

## 2021-02-20 DIAGNOSIS — R7303 Prediabetes: Secondary | ICD-10-CM

## 2021-02-20 NOTE — Telephone Encounter (Signed)
Patient informed and scheduled to be here 9-09 for labs. Order entered as future.

## 2021-02-21 ENCOUNTER — Telehealth: Payer: Self-pay | Admitting: Family

## 2021-02-21 DIAGNOSIS — N3000 Acute cystitis without hematuria: Secondary | ICD-10-CM

## 2021-02-21 NOTE — Telephone Encounter (Signed)
Future order placed 

## 2021-02-21 NOTE — Telephone Encounter (Signed)
Pt. Called in stating she has lab work 9/9 @ 10am. She believes she has a bladder infection and wanted to see if she could submit a urine sample at that lab visit or does she need an actual appointment.

## 2021-02-22 ENCOUNTER — Other Ambulatory Visit (INDEPENDENT_AMBULATORY_CARE_PROVIDER_SITE_OTHER): Payer: Medicare Other

## 2021-02-22 ENCOUNTER — Other Ambulatory Visit: Payer: Self-pay

## 2021-02-22 DIAGNOSIS — N3000 Acute cystitis without hematuria: Secondary | ICD-10-CM

## 2021-02-22 DIAGNOSIS — R7303 Prediabetes: Secondary | ICD-10-CM | POA: Diagnosis not present

## 2021-02-22 LAB — HEMOGLOBIN A1C: Hgb A1c MFr Bld: 6.9 % — ABNORMAL HIGH (ref 4.6–6.5)

## 2021-02-23 LAB — URINE CULTURE
MICRO NUMBER:: 12353342
SPECIMEN QUALITY:: ADEQUATE

## 2021-02-24 ENCOUNTER — Encounter: Payer: Self-pay | Admitting: Family

## 2021-02-25 MED ORDER — METRONIDAZOLE 500 MG PO TABS: 500.0000 mg | ORAL_TABLET | Freq: Two times a day (BID) | ORAL | 0 refills | Status: DC

## 2021-03-01 ENCOUNTER — Ambulatory Visit: Payer: Medicare Other | Admitting: Neurology

## 2021-03-08 ENCOUNTER — Ambulatory Visit: Payer: Medicare Other | Admitting: Cardiology

## 2021-03-13 ENCOUNTER — Telehealth (HOSPITAL_COMMUNITY): Payer: Self-pay | Admitting: Emergency Medicine

## 2021-03-13 NOTE — Telephone Encounter (Signed)
Attempted to call patient regarding upcoming cardiac MR appointment. Left message on voicemail with name and callback number Tylique Aull RN Navigator Cardiac Imaging [facility] [phone] Office [phone] Cell  

## 2021-03-15 ENCOUNTER — Ambulatory Visit: Payer: Medicare Other | Admitting: Physician Assistant

## 2021-03-15 ENCOUNTER — Ambulatory Visit: Payer: Medicare Other | Admitting: Cardiology

## 2021-03-15 ENCOUNTER — Ambulatory Visit (HOSPITAL_COMMUNITY): Admission: RE | Admit: 2021-03-15 | Payer: Medicare Other | Source: Ambulatory Visit

## 2021-03-17 ENCOUNTER — Other Ambulatory Visit: Payer: Self-pay | Admitting: Family

## 2021-03-26 ENCOUNTER — Other Ambulatory Visit: Payer: Self-pay | Admitting: Physician Assistant

## 2021-04-05 ENCOUNTER — Other Ambulatory Visit: Payer: Self-pay | Admitting: Physician Assistant

## 2021-04-05 ENCOUNTER — Other Ambulatory Visit: Payer: Self-pay | Admitting: Neurology

## 2021-04-05 ENCOUNTER — Other Ambulatory Visit: Payer: Self-pay | Admitting: Cardiovascular Disease

## 2021-04-05 ENCOUNTER — Other Ambulatory Visit: Payer: Self-pay | Admitting: Family

## 2021-04-11 ENCOUNTER — Telehealth: Payer: Self-pay | Admitting: Physician Assistant

## 2021-04-11 NOTE — Telephone Encounter (Signed)
Inbound call from patient stating she is ready to schedule EGD at the hospital and is wanting to know if it can be on a Thursday if possible.  Please advise.

## 2021-04-11 NOTE — Telephone Encounter (Signed)
Spoke with patient, in regards to recommendations. She has been scheduled for a follow up with Dr. Christella Hartigan on Friday, 04/12/21 at 10:50 am. Advised patient to arrive 10 minutes early for check in. Pt verbalized understanding and had no concerns at the end of the call.   Blondell Reveal, CMA is aware that patient has been added on a day prior.

## 2021-04-12 ENCOUNTER — Ambulatory Visit: Payer: Medicare Other | Admitting: Gastroenterology

## 2021-04-12 ENCOUNTER — Encounter: Payer: Self-pay | Admitting: Gastroenterology

## 2021-04-12 VITALS — BP 130/80 | HR 78 | Ht 66.0 in

## 2021-04-12 DIAGNOSIS — K219 Gastro-esophageal reflux disease without esophagitis: Secondary | ICD-10-CM | POA: Diagnosis not present

## 2021-04-12 NOTE — Progress Notes (Signed)
Review of pertinent gastrointestinal problems: 1. Family history of colon cancer; mother in her early 99s; colonoscopy Dr. Christella Hartigan 05/2013 found diverticulosis, no polyps, recommended recall at 5 years.  Colonoscopy September 2020 found diverticulosis but was otherwise normal. 2. RUQ pains: clinically seemed biliary-like; 05/2013 [place] [facility] was normal; 05/2013 HIDA scan with GB EF was normal.05/2013 LFTs were normal.  Was recommended, scheduled for EGD but she cancelled it.  EGD 05/2014 Dr. Christella Hartigan, mild to moderate gastritis; path showed no H. Pylori.  She had been taking BC powders twice a day (around 2013-2015).  Repeat EGD June 2017, Dr. Christella Hartigan.  This was requested by her bariatric surgeon prior to surgery given her history of gastritis.  I noted a small about of food residue in the stomach, the examination was otherwise normal.    3. Morbid obesity BMI 41 (10/2017 data)  HPI: This is a pleasant 65 year old woman  She was here in our office 8 months ago and she saw [place].  They discussed nausea which was becoming more frequent.  She was also having epigastric pains at times.  Victorino Dike recommended that she undergo repeat EGD given her epigastric pain and nausea.  It was to be scheduled at the hospital given her morbid obesity and relative immobility and wheelchair.  She never underwent that procedure, not sure why.  She called yesterday, 8 months later to go ahead and get it scheduled I asked that she return here to the office instead to update [place] on her symptoms.    She was recommended to start taking Pepcid 40 mg twice daily every morning and at nighttime.  Her pantoprazole 40 mg twice daily prescription was refilled.  She was given a GI cocktail to take as needed for abdominal pain.  Blood work September 2022 showed normal CBC and normal complete metabolic profile except for a creatinine of 1.2 TSH was normal at 1.97  Today she is here again in her wheelchair.  We were unable to get her way  because she cannot stand.  She has heartburn on a daily basis.  No dysphagia.  She is belching a lot.  She has intermittent epigastric pains.  She does not drink alcohol, much caffeinated beverages, she does not smoke cigarettes or eat much caffeine or peppermint  She seems to be truly taking her proton pump inhibitor twice daily and her H2 blocker twice daily  ROS: complete GI ROS as described in HPI, all other review negative.  Constitutional:  No unintentional weight loss   Past Medical History:  Diagnosis Date   Acute systolic congestive heart failure (HCC) 03/01/2014   Allergy    Anemia    Arthritis    Cardiomyopathy (HCC)    Cervical myelopathy (HCC)    CHF (congestive heart failure) (HCC)    GERD (gastroesophageal reflux disease)    Gout    Heart disease    Hyperlipidemia    Hypertension    Leg pain    Overactive bladder     Past Surgical History:  Procedure Laterality Date   ANTERIOR CERVICAL DECOMP/DISCECTOMY FUSION N/A 08/03/2016   Procedure: ANTERIOR CERVICAL DECOMPRESSION/DISCECTOMY FUSION C4-5, C5-6;  Surgeon: Barnett Abu, MD;  Location: [facility] OR;  Service: Neurosurgery;  Laterality: N/A;   COLONOSCOPY     COLONOSCOPY WITH PROPOFOL N/A 02/24/2019   Procedure: COLONOSCOPY WITH PROPOFOL;  Surgeon: Rachael Fee, MD;  Location: [facility];  Service: Endoscopy;  Laterality: N/A;   KNEE ARTHROSCOPY     2007   ROTATOR CUFF  REPAIR Right 03/23/13   TOTAL KNEE ARTHROPLASTY  09/22/2011   Procedure: TOTAL KNEE ARTHROPLASTY;  Surgeon: Raymon Mutton, MD;  Location: [facility] OR;  Service: Orthopedics;  Laterality: Right;    Current Outpatient Medications  Medication Sig Dispense Refill   allopurinol (ZYLOPRIM) 100 MG tablet TAKE 1 TABLET BY MOUTH  DAILY 90 tablet 3   Budeson-Glycopyrrol-Formoterol (BREZTRI AEROSPHERE) 160-9-4.8 MCG/ACT AERO Inhale 2 puffs into the lungs in the morning and at bedtime. 5.9 g 0   carvedilol (COREG) 12.5 MG tablet TAKE 1 TABLET BY MOUTH  TWICE  DAILY 180 tablet 4   colchicine 0.6 MG tablet Take 2 tabs by mouth now and 1 tab in 1 hour for gout. May repeat tomorrow if gout pain is not improved. 6 tablet 0   famotidine (PEPCID) 40 MG tablet Take 1 tablet (40 mg total) by mouth 2 (two) times daily. 180 tablet 1   fluticasone (FLONASE) 50 MCG/ACT nasal spray Place 2 sprays into both nostrils daily. 16 g 0   gabapentin (NEURONTIN) 300 MG capsule TAKE 3 CAPSULES BY MOUTH 3  TIMES DAILY 810 capsule 3   latanoprost (XALATAN) 0.005 % ophthalmic solution Place 1 drop into both eyes at bedtime.   11   Melatonin 3 MG TABS Take 1 tablet by mouth as needed.      NYAMYC powder APPLY 1 APPLICATION  TOPICALLY 3 TIMES DAILY 180 g 0   ondansetron (ZOFRAN) 4 MG tablet TAKE 1 TABLET BY MOUTH  EVERY 4 TO 6 HOURS AS  NEEDED FOR NAUSEA 90 tablet 0   pantoprazole (PROTONIX) 40 MG tablet Take 1 tablet (40 mg total) by mouth 2 (two) times daily. 180 tablet 3   potassium chloride SA (KLOR-CON) 20 MEQ tablet TAKE 1 TABLET BY MOUTH  DAILY 90 tablet 1   spironolactone (ALDACTONE) 25 MG tablet TAKE 1 TABLET BY MOUTH  DAILY 90 tablet 3   timolol (BETIMOL) 0.5 % ophthalmic solution Place 1 drop into both eyes every morning.      torsemide (DEMADEX) 20 MG tablet TAKE 2 TABLETS BY MOUTH  ALTERNATING WITH 1 TABLET  EVERY OTHER DAY 135 tablet 3   No current facility-administered medications for this visit.    Allergies as of 04/12/2021 - Review Complete 04/12/2021  Allergen Reaction Noted   Entresto [sacubitril-valsartan]  04/12/2020    Family History  Problem Relation Age of Onset   Cancer Mother        colon 42 and breast 55- deceased   Colon cancer Mother    Diabetes Father        living   Asthma Father    Hypertension Father    Heart attack Father 60       medical management per pt   Glaucoma Father        had eye transplant   Kidney failure Father        acute renal failure- died 41   Esophageal cancer Neg Hx    Stomach cancer Neg Hx    Rectal cancer  Neg Hx     Social History   Socioeconomic History   Marital status: Married    Spouse name: Not on file   Number of children: 2   Years of education: HS   Highest education level: Not on file  Occupational History   Occupation: Acupuncturist: [employer]  Tobacco Use   Smoking status: Never   Smokeless tobacco: Never  Vaping Use   Vaping Use:  Never used  Substance and Sexual Activity   Alcohol use: Not Currently    Alcohol/week: 2.0 standard drinks    Types: 2 Glasses of wine per week   Drug use: No   Sexual activity: Not on file  Other Topics Concern   Not on file  Social History Narrative   Married- 32 years   2 children- grown daughter- lives in [place]- has 2 children   Grown son lives in [place]- 4 children   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   1-2 cups caffeine daily.   Lives at home with her husband.   Social Determinants of Health   Financial Resource Strain: Not on file  Food Insecurity: Not on file  Transportation Needs: Not on file  Physical Activity: Not on file  Stress: Not on file  Social Connections: Not on file  Intimate Partner Violence: Not on file     Physical Exam: Ht 5\' 6"  (1.676 m)   LMP 06/17/2007   BMI 35.51 kg/m   She was unable to stand for 08/15/2007 to weigh her, she is in a wheelchair Constitutional: Morbidly obese, sits in wheelchair Psychiatric: alert and oriented x3 Abdomen: soft, nontender, nondistended, no obvious ascites, no peritoneal signs, normal bowel sounds No peripheral edema noted in lower extremities  Assessment and plan: 65 y.o. female with massive obesity, chronic GERD despite twice daily proton pump inhibitor and twice daily H2 blocker  I recommend evaluating her upper GI tract given her chronic GERD that is quite bothersome to her despite the fact that she is on maximum antiacid medical therapy.   She does not seem to have any exacerbating dietary factors.  She is morbidly obese and I explained to her that losing weight would probably help her heartburn symptoms.  Given her morbid obesity and her relative immobility the procedure will need to be done in the hospital setting.  I see no reason for any further blood work or imaging studies prior to then  Please see the "Patient Instructions" section for addition details about the plan.  66, MD [facility] 04/12/2021, 11:21 AM   Total time on date of encounter was 35 minutes (this included time spent preparing to see the patient reviewing records; obtaining and/or reviewing separately obtained history; performing a medically appropriate exam and/or evaluation; counseling and educating the patient and family if present; ordering medications, tests or procedures if applicable; and documenting clinical information in the health record).

## 2021-04-12 NOTE — H&P (View-Only) (Signed)
Review of pertinent gastrointestinal problems: 1. Family history of colon cancer; mother in her early 46s; colonoscopy Dr. Christella Hartigan 05/2013 found diverticulosis, no polyps, recommended recall at 5 years.  Colonoscopy September 2020 found diverticulosis but was otherwise normal. 2. RUQ pains: clinically seemed biliary-like; 05/2013 [place] [facility] was normal; 05/2013 HIDA scan with GB EF was normal.05/2013 LFTs were normal.  Was recommended, scheduled for EGD but she cancelled it.  EGD 05/2014 Dr. Christella Hartigan, mild to moderate gastritis; path showed no H. Pylori.  She had been taking BC powders twice a day (around 2013-2015).  Repeat EGD June 2017, Dr. Christella Hartigan.  This was requested by her bariatric surgeon prior to surgery given her history of gastritis.  I noted a small about of food residue in the stomach, the examination was otherwise normal.    3. Morbid obesity BMI 41 (10/2017 data)  HPI: This is a pleasant 65 year old woman  She was here in our office 8 months ago and she saw [place].  They discussed nausea which was becoming more frequent.  She was also having epigastric pains at times.  Victorino Dike recommended that she undergo repeat EGD given her epigastric pain and nausea.  It was to be scheduled at the hospital given her morbid obesity and relative immobility and wheelchair.  She never underwent that procedure, not sure why.  She called yesterday, 8 months later to go ahead and get it scheduled I asked that she return here to the office instead to update [place] on her symptoms.    She was recommended to start taking Pepcid 40 mg twice daily every morning and at nighttime.  Her pantoprazole 40 mg twice daily prescription was refilled.  She was given a GI cocktail to take as needed for abdominal pain.  Blood work September 2022 showed normal CBC and normal complete metabolic profile except for a creatinine of 1.2 TSH was normal at 1.97  Today she is here again in her wheelchair.  We were unable to get her way  because she cannot stand.  She has heartburn on a daily basis.  No dysphagia.  She is belching a lot.  She has intermittent epigastric pains.  She does not drink alcohol, much caffeinated beverages, she does not smoke cigarettes or eat much caffeine or peppermint  She seems to be truly taking her proton pump inhibitor twice daily and her H2 blocker twice daily  ROS: complete GI ROS as described in HPI, all other review negative.  Constitutional:  No unintentional weight loss   Past Medical History:  Diagnosis Date   Acute systolic congestive heart failure (HCC) 03/01/2014   Allergy    Anemia    Arthritis    Cardiomyopathy (HCC)    Cervical myelopathy (HCC)    CHF (congestive heart failure) (HCC)    GERD (gastroesophageal reflux disease)    Gout    Heart disease    Hyperlipidemia    Hypertension    Leg pain    Overactive bladder     Past Surgical History:  Procedure Laterality Date   ANTERIOR CERVICAL DECOMP/DISCECTOMY FUSION N/A 08/03/2016   Procedure: ANTERIOR CERVICAL DECOMPRESSION/DISCECTOMY FUSION C4-5, C5-6;  Surgeon: Barnett Abu, MD;  Location: [facility] OR;  Service: Neurosurgery;  Laterality: N/A;   COLONOSCOPY     COLONOSCOPY WITH PROPOFOL N/A 02/24/2019   Procedure: COLONOSCOPY WITH PROPOFOL;  Surgeon: Rachael Fee, MD;  Location: [facility];  Service: Endoscopy;  Laterality: N/A;   KNEE ARTHROSCOPY     2007   ROTATOR CUFF  REPAIR Right 03/23/13   TOTAL KNEE ARTHROPLASTY  09/22/2011   Procedure: TOTAL KNEE ARTHROPLASTY;  Surgeon: Raymon Mutton, MD;  Location: [facility] OR;  Service: Orthopedics;  Laterality: Right;    Current Outpatient Medications  Medication Sig Dispense Refill   allopurinol (ZYLOPRIM) 100 MG tablet TAKE 1 TABLET BY MOUTH  DAILY 90 tablet 3   Budeson-Glycopyrrol-Formoterol (BREZTRI AEROSPHERE) 160-9-4.8 MCG/ACT AERO Inhale 2 puffs into the lungs in the morning and at bedtime. 5.9 g 0   carvedilol (COREG) 12.5 MG tablet TAKE 1 TABLET BY MOUTH  TWICE  DAILY 180 tablet 4   colchicine 0.6 MG tablet Take 2 tabs by mouth now and 1 tab in 1 hour for gout. May repeat tomorrow if gout pain is not improved. 6 tablet 0   famotidine (PEPCID) 40 MG tablet Take 1 tablet (40 mg total) by mouth 2 (two) times daily. 180 tablet 1   fluticasone (FLONASE) 50 MCG/ACT nasal spray Place 2 sprays into both nostrils daily. 16 g 0   gabapentin (NEURONTIN) 300 MG capsule TAKE 3 CAPSULES BY MOUTH 3  TIMES DAILY 810 capsule 3   latanoprost (XALATAN) 0.005 % ophthalmic solution Place 1 drop into both eyes at bedtime.   11   Melatonin 3 MG TABS Take 1 tablet by mouth as needed.      NYAMYC powder APPLY 1 APPLICATION  TOPICALLY 3 TIMES DAILY 180 g 0   ondansetron (ZOFRAN) 4 MG tablet TAKE 1 TABLET BY MOUTH  EVERY 4 TO 6 HOURS AS  NEEDED FOR NAUSEA 90 tablet 0   pantoprazole (PROTONIX) 40 MG tablet Take 1 tablet (40 mg total) by mouth 2 (two) times daily. 180 tablet 3   potassium chloride SA (KLOR-CON) 20 MEQ tablet TAKE 1 TABLET BY MOUTH  DAILY 90 tablet 1   spironolactone (ALDACTONE) 25 MG tablet TAKE 1 TABLET BY MOUTH  DAILY 90 tablet 3   timolol (BETIMOL) 0.5 % ophthalmic solution Place 1 drop into both eyes every morning.      torsemide (DEMADEX) 20 MG tablet TAKE 2 TABLETS BY MOUTH  ALTERNATING WITH 1 TABLET  EVERY OTHER DAY 135 tablet 3   No current facility-administered medications for this visit.    Allergies as of 04/12/2021 - Review Complete 04/12/2021  Allergen Reaction Noted   Entresto [sacubitril-valsartan]  04/12/2020    Family History  Problem Relation Age of Onset   Cancer Mother        colon 51 and breast 22- deceased   Colon cancer Mother    Diabetes Father        living   Asthma Father    Hypertension Father    Heart attack Father 27       medical management per pt   Glaucoma Father        had eye transplant   Kidney failure Father        acute renal failure- died 43   Esophageal cancer Neg Hx    Stomach cancer Neg Hx    Rectal cancer  Neg Hx     Social History   Socioeconomic History   Marital status: Married    Spouse name: Not on file   Number of children: 2   Years of education: HS   Highest education level: Not on file  Occupational History   Occupation: Acupuncturist: [employer]  Tobacco Use   Smoking status: Never   Smokeless tobacco: Never  Vaping Use   Vaping Use:  Never used  Substance and Sexual Activity   Alcohol use: Not Currently    Alcohol/week: 2.0 standard drinks    Types: 2 Glasses of wine per week   Drug use: No   Sexual activity: Not on file  Other Topics Concern   Not on file  Social History Narrative   Married- 32 years   2 children- grown daughter- lives in [place]- has 2 children   Grown son lives in [place]- 4 children   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   1-2 cups caffeine daily.   Lives at home with her husband.   Social Determinants of Health   Financial Resource Strain: Not on file  Food Insecurity: Not on file  Transportation Needs: Not on file  Physical Activity: Not on file  Stress: Not on file  Social Connections: Not on file  Intimate Partner Violence: Not on file     Physical Exam: Ht 5\' 6"  (1.676 m)   LMP 06/17/2007   BMI 35.51 kg/m   She was unable to stand for 08/15/2007 to weigh her, she is in a wheelchair Constitutional: Morbidly obese, sits in wheelchair Psychiatric: alert and oriented x3 Abdomen: soft, nontender, nondistended, no obvious ascites, no peritoneal signs, normal bowel sounds No peripheral edema noted in lower extremities  Assessment and plan: 65 y.o. female with massive obesity, chronic GERD despite twice daily proton pump inhibitor and twice daily H2 blocker  I recommend evaluating her upper GI tract given her chronic GERD that is quite bothersome to her despite the fact that she is on maximum antiacid medical therapy.   She does not seem to have any exacerbating dietary factors.  She is morbidly obese and I explained to her that losing weight would probably help her heartburn symptoms.  Given her morbid obesity and her relative immobility the procedure will need to be done in the hospital setting.  I see no reason for any further blood work or imaging studies prior to then  Please see the "Patient Instructions" section for addition details about the plan.  66, MD [facility] 04/12/2021, 11:21 AM   Total time on date of encounter was 35 minutes (this included time spent preparing to see the patient reviewing records; obtaining and/or reviewing separately obtained history; performing a medically appropriate exam and/or evaluation; counseling and educating the patient and family if present; ordering medications, tests or procedures if applicable; and documenting clinical information in the health record).

## 2021-04-12 NOTE — Patient Instructions (Addendum)
If you are age 63 or younger, your body mass index should be between 19-25. Your Body mass index is 35.51 kg/m. If this is out of the aformentioned range listed, please consider follow up with your Primary Care Provider.   ________________________________________________________  The [facility] providers would like to encourage you to use [facility] to communicate with providers for non-urgent requests or questions.  Due to long hold times on the telephone, sending your provider a message by [facility] may be a faster and more efficient way to get a response.  Please allow 48 business hours for a response.  Please remember that this is for non-urgent requests.  _______________________________________________________  Briana Miller have been scheduled for an endoscopy. Please follow written instructions given to you at your visit today. If you use inhalers (even only as needed), please bring them with you on the day of your procedure.  Due to recent changes in healthcare laws, you may see the results of your imaging and laboratory studies on MyChart before your provider has had a chance to review them.  We understand that in some cases there may be results that are confusing or concerning to you. Not all laboratory results come back in the same time frame and the provider may be waiting for multiple results in order to interpret others.  Please give [place] 48 hours in order for your provider to thoroughly review all the results before contacting the office for clarification of your results.   Thank you for entrusting me with your care and choosing [facility].  Dr Christella Hartigan

## 2021-04-15 ENCOUNTER — Encounter (HOSPITAL_COMMUNITY): Payer: Self-pay | Admitting: Gastroenterology

## 2021-04-16 NOTE — Progress Notes (Signed)
Attempted to obtain medical history via telephone, unable to reach at this time. I left a voicemail to return pre surgical testing department's phone call.  

## 2021-04-18 ENCOUNTER — Encounter: Payer: Self-pay | Admitting: Gastroenterology

## 2021-04-18 NOTE — Progress Notes (Signed)
HPI- F never smoker referred last year for evaluation of cough and suspected OSA. Complicated by Migraine, HTN, CHF/ dilated CM, Allergic Rhinitis, GERD, Cervical Myelopathy, Osteoarthritis, Morbid Obesity, Chronic Pain, Borderline Diabetes, Glaucoma, Disability (Hasn't stood up since 2017),   PFT and Sleep study were ordered but not done.  =====================================================================  02/02/20- 64 yoF never smoker referred for evaluation of cough and suspected OSA by Sandford Craze, NP Medical problem list includes Migraine, HTN, CHF/ dilated CM, Allergic Rhinitis, GERD, Cervical Myelopathy, Osteoarthritis, Morbid Obesity, Chronic Pain, Borderline Diabetes, Glaucoma, Body weight today patient reported 240 lbs- unable to stand from wheelchair Epworth score 15 She was seen by Dr Vassie Loll for same issues in 2015. OSA suspected but sleep study not done. Cough then attributed to lisinopril. Sleep- Was to have had more recent sleep study per cardiology, prevented by Covid restrictions. Admits loud snoring. She isn't clear about what husband notices. She lies in bed watching TV or facebook, wakes during night to do the same. Melatonin, 1 cup AM coffee.  Cough- Has noticed about a year and a half. Unaware of triggers. Wakes coughing occ at night, productive white. Less cough in day. Sleeps propped up on pillows. Admits some cough occ with swallowing. On medication for GERD. Modest benefit from otc cough syrups. Remote hx asthma, last tried an inhaler 2 yrs ago. Not aware of wheeze. She is not on an ACEI, but takes Timolol eye drops. On Gabapentin, which might help upper airway cough syndrome. Some postnasal drip, apparently mild. Known CHF- not clear if impacts breathing.  Wheelchair dependent and on total disability. Hx C-spine fusion, pending TKR. Had 2 Phizer Covax.  CT coronary morph 01/20/20-  Limited view of the lung parenchyma demonstrates no suspicious nodularity. Airways  are normal. Limited view of the mediastinum demonstrates no adenopathy. Esophagus normal. Limited view of the upper abdomen unremarkable. Limited view of the skeleton and chest wall is unremarkable. IMPRESSION: No significant extracardiac findings.  ECHO 12/16/19- EF 30-35%, Global hypokinesis, no PAH, Gr1 DD  01/10/21- 65 yoF never smoker referred last year for evaluation of cough and suspected OSA. Complicated by Migraine, HTN, CHF/ dilated CM, Allergic Rhinitis, GERD, Cervical Myelopathy, Osteoarthritis, Morbid Obesity, Chronic Pain, Borderline Diabetes, Glaucoma, Disability (Hasn't stood up since 2017),   PFT and Sleep study were ordered but not done. CXR 02/02/20-  IMPRESSION: Increased interstitial prominence since 2018. This may reflect chronic interstitial changes or mild edema. Covid vax- 3 Phizer -----Chronic cough, occ productive Increased cough  over past month- she questions if related to her CHF. White phlegm. Discussed interstitial prominence on CXR- possibly edema vs ILD.  Note also Timolol beta-blocker. Note also GERD- no heartburn on meds.  04/19/21- 19 yoF never smoker followed for evaluation of Cough and suspected OSA. Complicated by Migraine, HTN, CHF/ dilated CM, Allergic Rhinitis, GERD, Cervical Myelopathy, Osteoarthritis, Morbid Obesity, Chronic Pain, Borderline Diabetes, Glaucoma, Disability (Hasn't stood up since 2017),   PFT and Sleep study were ordered but not done. Covid vax- 3 Phizer Flu - had Sampled Breztri Pending EGD by Dr Christella Hartigan for GERD -----Patient reports that she has a lot of thick white sputum coming up,  Describes mostly morning cough.  Like to Trelegy better than Breztri, which she said made her feel "weird".  Trelegy helps her breathe better and cough less.  ROS-see HPI   + = positive Constitutional:    weight loss, night sweats, fevers, chills,+ fatigue, lassitude. HEENT:    headaches, difficulty swallowing, tooth/dental problems, sore throat,  sneezing, itching, ear ache, nasal congestion, post nasal drip, snoring CV:    chest pain, orthopnea, PND, swelling in lower extremities, anasarca,                                   dizziness, palpitations Resp:   shortness of breath with exertion or at rest.                +productive cough,   non-productive cough, coughing up of blood.              change in color of mucus.  wheezing.   Skin:    rash or lesions. GI:  No-   heartburn, indigestion, abdominal pain, nausea, vomiting, diarrhea,                 change in bowel habits, loss of appetite GU: dysuria, change in color of urine, no urgency or frequency.   flank pain. MS:  + joint pain, stiffness, decreased range of motion, back pain. Neuro-     nothing unusual Psych:  change in mood or affect.  depression or anxiety.   memory loss.  OBJ- Physical Exam General- Alert, Oriented, Affect-appropriate, Distress- none acute, +Morbidly Obese, +power wheelchair  +Looks heavier than her stated weight of 220 lbs. Skin- rash-none, lesions- none, excoriation- none Lymphadenopathy- none Head- atraumatic            Eyes- Gross vision intact, PERRLA, conjunctivae and secretions clear            Ears- Hearing, canals-normal            Nose- Clear, no-Septal dev, mucus, polyps, erosion, perforation             Throat- Mallampati III , mucosa clear , drainage- none, tonsils+ present Neck- flexible , trachea midline, no stridor , thyroid nl, carotid no bruit Chest - symmetrical excursion , unlabored           Heart/CV- RRR , no murmur , no gallop  , no rub, nl s1 s2                           - JVD- none , edema- none, stasis changes- none, varices- none           Lung- clear to P&A, wheeze- none, cough- none , dullness-none, rub- none           Chest wall-  Abd-  Br/ Gen/ Rectal- Not done, not indicated Extrem- +elastic hose Neuro- grossly intact to observation

## 2021-04-19 ENCOUNTER — Encounter: Payer: Self-pay | Admitting: Internal Medicine

## 2021-04-19 ENCOUNTER — Other Ambulatory Visit: Payer: Self-pay

## 2021-04-19 ENCOUNTER — Ambulatory Visit: Payer: Medicare Other | Admitting: Internal Medicine

## 2021-04-19 ENCOUNTER — Ambulatory Visit (INDEPENDENT_AMBULATORY_CARE_PROVIDER_SITE_OTHER): Payer: Medicare Other

## 2021-04-19 VITALS — BP 128/80 | HR 83 | Temp 97.8°F | Ht 66.0 in | Wt 220.0 lb

## 2021-04-19 DIAGNOSIS — R053 Chronic cough: Secondary | ICD-10-CM | POA: Diagnosis not present

## 2021-04-19 MED ORDER — TRELEGY ELLIPTA 100-62.5-25 MCG/ACT IN AEPB: INHALATION_SPRAY | RESPIRATORY_TRACT | 4 refills | Status: DC

## 2021-04-19 MED ORDER — BENZONATATE 200 MG PO CAPS: 200.0000 mg | ORAL_CAPSULE | Freq: Three times a day (TID) | ORAL | 5 refills | Status: DC | PRN

## 2021-04-19 NOTE — Patient Instructions (Addendum)
Order- CXR   dx chronic cough  Script sent for Trelegy inhaler     inhale 1 puff, then rinse mouth, once daily  Script sent refilling tessalon perles for cough sent to [address]  Please call if we can help

## 2021-04-23 ENCOUNTER — Encounter: Payer: Self-pay | Admitting: *Deleted

## 2021-04-25 ENCOUNTER — Ambulatory Visit (HOSPITAL_COMMUNITY): Payer: Medicare Other | Admitting: Certified Registered"

## 2021-04-25 ENCOUNTER — Ambulatory Visit (HOSPITAL_COMMUNITY)
Admission: RE | Admit: 2021-04-25 | Discharge: 2021-04-25 | Disposition: A | Discharge status: Home / Self Care | Payer: Medicare Other | Source: Ambulatory Visit | Attending: Gastroenterology | Admitting: Gastroenterology

## 2021-04-25 ENCOUNTER — Encounter (HOSPITAL_COMMUNITY): Payer: Self-pay | Admitting: Gastroenterology

## 2021-04-25 ENCOUNTER — Encounter (HOSPITAL_COMMUNITY)
Admission: RE | Disposition: A | Discharge status: Home / Self Care | Payer: Self-pay | Source: Ambulatory Visit | Attending: Gastroenterology

## 2021-04-25 DIAGNOSIS — G43909 Migraine, unspecified, not intractable, without status migrainosus: Secondary | ICD-10-CM | POA: Diagnosis not present

## 2021-04-25 DIAGNOSIS — E785 Hyperlipidemia, unspecified: Secondary | ICD-10-CM | POA: Diagnosis not present

## 2021-04-25 DIAGNOSIS — Z6835 Body mass index (BMI) 35.0-35.9, adult: Secondary | ICD-10-CM | POA: Diagnosis not present

## 2021-04-25 DIAGNOSIS — I110 Hypertensive heart disease with heart failure: Secondary | ICD-10-CM | POA: Insufficient documentation

## 2021-04-25 DIAGNOSIS — I5020 Unspecified systolic (congestive) heart failure: Secondary | ICD-10-CM | POA: Diagnosis not present

## 2021-04-25 DIAGNOSIS — K219 Gastro-esophageal reflux disease without esophagitis: Secondary | ICD-10-CM | POA: Insufficient documentation

## 2021-04-25 DIAGNOSIS — G4733 Obstructive sleep apnea (adult) (pediatric): Secondary | ICD-10-CM | POA: Diagnosis not present

## 2021-04-25 DIAGNOSIS — R12 Heartburn: Secondary | ICD-10-CM | POA: Diagnosis not present

## 2021-04-25 HISTORY — PX: ESOPHAGOGASTRODUODENOSCOPY (EGD) WITH PROPOFOL: SHX5813

## 2021-04-25 SURGERY — ESOPHAGOGASTRODUODENOSCOPY (EGD) WITH PROPOFOL
Anesthesia: Monitor Anesthesia Care

## 2021-04-25 MED ORDER — PROPOFOL 500 MG/50ML IV EMUL
INTRAVENOUS | Status: DC | PRN
  Administered 2021-04-25: 150 ug/kg/min via INTRAVENOUS

## 2021-04-25 MED ORDER — LIDOCAINE 2% (20 MG/ML) 5 ML SYRINGE
INTRAMUSCULAR | Status: DC | PRN
  Administered 2021-04-25: 100 mg via INTRAVENOUS

## 2021-04-25 MED ORDER — LACTATED RINGERS IV SOLN: INTRAVENOUS | Status: DC

## 2021-04-25 MED ORDER — PROPOFOL 500 MG/50ML IV EMUL
INTRAVENOUS | Status: AC
  Filled 2021-04-25: qty 50

## 2021-04-25 MED ORDER — PROPOFOL 10 MG/ML IV BOLUS
INTRAVENOUS | Status: DC | PRN
  Administered 2021-04-25: 20 mg via INTRAVENOUS

## 2021-04-25 MED ORDER — SODIUM CHLORIDE 0.9 % IV SOLN: INTRAVENOUS | Status: DC

## 2021-04-25 SURGICAL SUPPLY — 14 items

## 2021-04-25 NOTE — Anesthesia Procedure Notes (Signed)
Procedure Name: MAC Date/Time: 04/25/2021 9:23 AM Performed by: Niel Hummer, CRNA Pre-anesthesia Checklist: Patient identified, Emergency Drugs available, Suction available and Patient being monitored Oxygen Delivery Method: Simple face mask

## 2021-04-25 NOTE — Transfer of Care (Signed)
Immediate Anesthesia Transfer of Care Note  Patient: Anthony Roland  Procedure(s) Performed: ESOPHAGOGASTRODUODENOSCOPY (EGD) WITH PROPOFOL  Patient Location: PACU  Anesthesia Type:MAC  Level of Consciousness: awake  Airway & Oxygen Therapy: Patient Spontanous Breathing and Patient connected to face mask oxygen  Post-op Assessment: Report given to RN, Post -op Vital signs reviewed and stable and Patient moving all extremities X 4  Post vital signs: Reviewed and stable  Last Vitals:  Vitals Value Taken Time  BP    Temp    Pulse 84 04/25/21 0937  Resp 22 04/25/21 0937  SpO2 100 % 04/25/21 0937  Vitals shown include unvalidated device data.  Last Pain:  Vitals:   04/25/21 0907  TempSrc: Oral  PainSc: 0-No pain         Complications: No notable events documented.

## 2021-04-25 NOTE — Op Note (Signed)
[facility] Patient Name: Briana Miller Procedure Date: 04/25/2021 MRN: [id number] Attending MD: Milus Banister , MD Date of Birth: [birth year] CSN: [id number] Age: 65 Admit Type: Outpatient Procedure:                Upper GI endoscopy Indications:              Heartburn Providers:                Milus Banister, MD, Glori Bickers, RN, Frazier Richards, Technician Referring MD:              Medicines:                Monitored Anesthesia Care Complications:            No immediate complications. Estimated blood loss:                            None. Estimated Blood Loss:     Estimated blood loss: none. Procedure:                Pre-Anesthesia Assessment:                           - Prior to the procedure, a History and Physical                            was performed, and patient medications and                            allergies were reviewed. The patient's tolerance of                            previous anesthesia was also reviewed. The risks                            and benefits of the procedure and the sedation                            options and risks were discussed with the patient.                            All questions were answered, and informed consent                            was obtained. Prior Anticoagulants: The patient has                            taken no previous anticoagulant or antiplatelet                            agents. ASA Grade Assessment: IV - A patient with                            severe systemic disease  that is a constant threat                            to life. After reviewing the risks and benefits,                            the patient was deemed in satisfactory condition to                            undergo the procedure.                           After obtaining informed consent, the endoscope was                            passed under direct vision. Throughout the                             procedure, the patient's blood pressure, pulse, and                            oxygen saturations were monitored continuously. The                            [id number]-[id number] ([id number]) Olympus endoscope was introduced                            through the mouth, and advanced to the second part                            of duodenum. The upper GI endoscopy was                            accomplished without difficulty. The patient                            tolerated the procedure well. Scope In: Scope Out: Findings:      The esophagus was normal.      The stomach was normal.      The examined duodenum was normal. Impression:               - Normal UGI tract.                           - You have non-erosive GERD. Moderate Sedation:      Not Applicable - Patient had care per Anesthesia. Recommendation:           - Patient has a contact number available for                            emergencies. The signs and symptoms of potential                            delayed complications were discussed with the  patient. Return to normal activities tomorrow.                            Written discharge instructions were provided to the                            patient.                           - Resume previous diet.                           - Continue present medications. Please change the                            way you are taking your antiacid medicines as they                            are designed to be taken differently. Protonix                            (pantoprazole) 40mg  pills twice day (20-30 minutes                            before your breakfast and dinner meals). Pepcid                            (famotidine) 20 or 40mg  should be taken at bedtime.                           - Losing weight will likely help your GERD problems                            as well.                           - Continue avoiding too much caffeine, alcohol,                             chocolate, peppermint. You should not lay down                            within 2-3 hours of eating any food. Procedure Code(s):        --- Professional ---                           [phone], Esophagogastroduodenoscopy, flexible,                            transoral; diagnostic, including collection of                            specimen(s) by brushing or washing, when performed                            (  separate procedure) Diagnosis Code(s):        --- Professional ---                           R12, Heartburn CPT copyright 2019 [employer]. All rights reserved. The codes documented in this report are preliminary and upon coder review may  be revised to meet current compliance requirements. Milus Banister, MD 04/25/2021 9:37:53 AM This report has been signed electronically. Number of Addenda: 0

## 2021-04-25 NOTE — Discharge Instructions (Signed)
YOU HAD AN ENDOSCOPIC PROCEDURE TODAY: Refer to the procedure report and other information in the discharge instructions given to you for any specific questions about what was found during the examination. If this information does not answer your questions, please call [place] office at [phone] to clarify.   YOU SHOULD EXPECT: Some feelings of bloating in the abdomen. Passage of more gas than usual. Walking can help get rid of the air that was put into your GI tract during the procedure and reduce the bloating. If you had a lower endoscopy (such as a colonoscopy or flexible sigmoidoscopy) you may notice spotting of blood in your stool or on the toilet paper. Some abdominal soreness may be present for a day or two, also.  DIET: Your first meal following the procedure should be a light meal and then it is ok to progress to your normal diet. A half-sandwich or bowl of soup is an example of a good first meal. Heavy or fried foods are harder to digest and may make you feel nauseous or bloated. Drink plenty of fluids but you should avoid alcoholic beverages for 24 hours. If you had a esophageal dilation, please see attached instructions for diet.    ACTIVITY: Your care partner should take you home directly after the procedure. You should plan to take it easy, moving slowly for the rest of the day. You can resume normal activity the day after the procedure however YOU SHOULD NOT DRIVE, use power tools, machinery or perform tasks that involve climbing or major physical exertion for 24 hours (because of the sedation medicines used during the test).   SYMPTOMS TO REPORT IMMEDIATELY: A gastroenterologist can be reached at any hour. Please call [phone]  for any of the following symptoms:   Following upper endoscopy (EGD, EUS, ERCP, esophageal dilation) Vomiting of blood or coffee ground material  New, significant abdominal pain  New, significant chest pain or pain under the shoulder blades  Painful or  persistently difficult swallowing  New shortness of breath  Black, tarry-looking or red, bloody stools  FOLLOW UP:  If any biopsies were taken you will be contacted by phone or by letter within the next 1-3 weeks. Call [phone]  if you have not heard about the biopsies in 3 weeks.  Please also call with any specific questions about appointments or follow up tests.  

## 2021-04-25 NOTE — Anesthesia Postprocedure Evaluation (Signed)
Anesthesia Post Note  Patient: Briana Miller  Procedure(s) Performed: ESOPHAGOGASTRODUODENOSCOPY (EGD) WITH PROPOFOL     Patient location during evaluation: PACU Anesthesia Type: MAC Level of consciousness: awake and alert Pain management: pain level controlled Vital Signs Assessment: post-procedure vital signs reviewed and stable Respiratory status: spontaneous breathing, nonlabored ventilation, respiratory function stable and patient connected to nasal cannula oxygen Cardiovascular status: stable and blood pressure returned to baseline Postop Assessment: no apparent nausea or vomiting Anesthetic complications: no   No notable events documented.  Last Vitals:  Vitals:   04/25/21 0946 04/25/21 0951  BP: 118/68 127/78  Pulse:  80  Resp: 17 15  Temp:    SpO2:  91%    Last Pain:  Vitals:   04/25/21 0938  TempSrc: Oral  PainSc:                  Raydon Chappuis S

## 2021-04-25 NOTE — Interval H&P Note (Signed)
History and Physical Interval Note:  04/25/2021 9:07 AM  Briana Miller  has presented today for surgery, with the diagnosis of GERD.  The various methods of treatment have been discussed with the patient and family. After consideration of risks, benefits and other options for treatment, the patient has consented to  Procedure(s): ESOPHAGOGASTRODUODENOSCOPY (EGD) WITH PROPOFOL (N/A) as a surgical intervention.  The patient's history has been reviewed, patient examined, no change in status, stable for surgery.  I have reviewed the patient's chart and labs.  Questions were answered to the patient's satisfaction.     Rachael Fee

## 2021-04-25 NOTE — Anesthesia Preprocedure Evaluation (Signed)
Anesthesia Evaluation  Patient identified by MRN, date of birth, ID band Patient awake    Reviewed: Allergy & Precautions, NPO status , Patient's Chart, lab work & pertinent test results  Airway Mallampati: II  TM Distance: >3 FB Neck ROM: Full    Dental no notable dental hx.    Pulmonary neg pulmonary ROS,    Pulmonary exam normal breath sounds clear to auscultation       Cardiovascular hypertension, Pt. on medications +CHF and + DOE  Normal cardiovascular exam Rhythm:Regular Rate:Normal  Left ventricular ejection fraction, by estimation, is 30 to 35%. The  left ventricle has moderately decreased function. The left ventricle  demonstrates global hypokinesis. The left ventricular internal cavity size  was mildly dilated. Left ventricular  diastolic parameters are consistent with Grade I diastolic dysfunction  (impaired relaxation).  2. Right ventricular systolic function is normal. The right ventricular  size is normal. There is moderately elevated pulmonary artery systolic  pressure. The estimated right ventricular systolic pressure is 63.8 mmHg.  3. The mitral valve is abnormal. Mild to moderate mitral valve  regurgitation.  4. The aortic valve is tricuspid. Aortic valve regurgitation is not  visualized.  5. The inferior vena cava is normal in size with <50% respiratory  variability, suggesting right atrial pressure of 8 mmHg.    Neuro/Psych negative neurological ROS  negative psych ROS   GI/Hepatic Neg liver ROS, GERD  Medicated,  Endo/Other  negative endocrine ROS  Renal/GU negative Renal ROS  negative genitourinary   Musculoskeletal negative musculoskeletal ROS (+)   Abdominal   Peds negative pediatric ROS (+)  Hematology negative hematology ROS (+)   Anesthesia Other Findings   Reproductive/Obstetrics negative OB ROS                             Anesthesia  Physical Anesthesia Plan  ASA: 3  Anesthesia Plan: MAC   Post-op Pain Management:    Induction: Intravenous  PONV Risk Score and Plan: 2 and Propofol infusion and Treatment may vary due to age or medical condition  Airway Management Planned: Simple Face Mask  Additional Equipment:   Intra-op Plan:   Post-operative Plan:   Informed Consent: I have reviewed the patients History and Physical, chart, labs and discussed the procedure including the risks, benefits and alternatives for the proposed anesthesia with the patient or authorized representative who has indicated his/her understanding and acceptance.     Dental advisory given  Plan Discussed with: CRNA and Surgeon  Anesthesia Plan Comments:         Anesthesia Quick Evaluation

## 2021-04-26 ENCOUNTER — Encounter (HOSPITAL_COMMUNITY): Payer: Self-pay | Admitting: Gastroenterology

## 2021-05-03 ENCOUNTER — Other Ambulatory Visit: Payer: Self-pay | Admitting: Family

## 2021-05-07 ENCOUNTER — Other Ambulatory Visit: Payer: Self-pay

## 2021-05-07 ENCOUNTER — Telehealth (INDEPENDENT_AMBULATORY_CARE_PROVIDER_SITE_OTHER): Payer: Medicare Other | Admitting: Family

## 2021-05-07 DIAGNOSIS — E119 Type 2 diabetes mellitus without complications: Secondary | ICD-10-CM | POA: Diagnosis not present

## 2021-05-07 DIAGNOSIS — E1159 Type 2 diabetes mellitus with other circulatory complications: Secondary | ICD-10-CM | POA: Insufficient documentation

## 2021-05-07 MED ORDER — OZEMPIC (0.25 OR 0.5 MG/DOSE) 2 MG/1.5ML ~~LOC~~ SOPN: 0.2500 mg | PEN_INJECTOR | SUBCUTANEOUS | 0 refills | Status: DC

## 2021-05-07 NOTE — Progress Notes (Signed)
MyChart Video Visit    Virtual Visit via Video Note   This visit type was conducted due to national recommendations for restrictions regarding the COVID-19 Pandemic (e.g. social distancing) in an effort to limit this patient's exposure and mitigate transmission in our community. This patient is at least at moderate risk for complications without adequate follow up. This format is felt to be most appropriate for this patient at this time. Physical exam was limited by quality of the video and audio technology used for the visit. Briana Miller was able to get the patient set up on a video visit.  Patient location: Home Patient and provider in visit Provider location: Office  I discussed the limitations of evaluation and management by telemedicine and the availability of in person appointments. The patient expressed understanding and agreed to proceed.  Visit Date: 05/07/2021  Today's healthcare provider: Lemont Fillers, NP     Subjective:    Patient ID: Briana Miller, female    DOB: 03-Jul-1955, 65 y.o.   MRN: [id number]  Chief Complaint  Patient presents with   Obesity    Patient will like to discuss weight management    HPI Patient is in today for a video visit.  Obesity: She is interested in getting on Ozempic for help with her weight loss. She notes that she is interested in decreasing her weight due to health concerns such as her gastric issues. She believes that her weight is in the 260's, but she is wheelchair bound which makes it difficult for her to weigh at home.  Wt Readings from Last 3 Encounters:  04/25/21 220 lb (99.8 kg)  04/19/21 220 lb (99.8 kg)  04/12/20 220 lb (99.8 kg)    Blood sugar: Her last A1C was elevated when checked. She was high enough for her to be considered diabetic.  Lab Results  Component Value Date   HGBA1C 6.9 (H) 02/22/2021  Diet: She notes that she does not eat breakfast most days and just waits to eat until lunch. She mentions  that she like to have stir fry without rice for lunch and does not like fried foods. She notes she eats lots of pickles and pears. She notes a decrease in coffee intake and mentions she has increased her water intake. She also mentions that she will have cranberry juice or ginger ale once a week.  Past Medical History:  Diagnosis Date   Acute systolic congestive heart failure (HCC) 03/01/2014   Allergy    Anemia    Arthritis    Cardiomyopathy (HCC)    Cervical myelopathy (HCC)    CHF (congestive heart failure) (HCC)    GERD (gastroesophageal reflux disease)    Gout    Heart disease    Hyperlipidemia    Hypertension    Leg pain    Overactive bladder     Past Surgical History:  Procedure Laterality Date   ANTERIOR CERVICAL DECOMP/DISCECTOMY FUSION N/A 08/03/2016   Procedure: ANTERIOR CERVICAL DECOMPRESSION/DISCECTOMY FUSION C4-5, C5-6;  Surgeon: Barnett Abu, MD;  Location: [facility] OR;  Service: Neurosurgery;  Laterality: N/A;   COLONOSCOPY     COLONOSCOPY WITH PROPOFOL N/A 02/24/2019   Procedure: COLONOSCOPY WITH PROPOFOL;  Surgeon: Rachael Fee, MD;  Location: [facility];  Service: Endoscopy;  Laterality: N/A;   ESOPHAGOGASTRODUODENOSCOPY (EGD) WITH PROPOFOL N/A 04/25/2021   Procedure: ESOPHAGOGASTRODUODENOSCOPY (EGD) WITH PROPOFOL;  Surgeon: Rachael Fee, MD;  Location: [facility];  Service: Endoscopy;  Laterality: N/A;   KNEE ARTHROSCOPY  2007   ROTATOR CUFF REPAIR Right 03/23/13   TOTAL KNEE ARTHROPLASTY  09/22/2011   Procedure: TOTAL KNEE ARTHROPLASTY;  Surgeon: Raymon Mutton, MD;  Location: [facility] OR;  Service: Orthopedics;  Laterality: Right;    Family History  Problem Relation Age of Onset   Cancer Mother        colon 54 and breast 29- deceased   Colon cancer Mother    Diabetes Father        living   Asthma Father    Hypertension Father    Heart attack Father 58       medical management per pt   Glaucoma Father        had eye transplant   Kidney failure Father         acute renal failure- died 3   Esophageal cancer Neg Hx    Stomach cancer Neg Hx    Rectal cancer Neg Hx     Social History   Socioeconomic History   Marital status: Married    Spouse name: Not on file   Number of children: 2   Years of education: HS   Highest education level: Not on file  Occupational History   Occupation: Acupuncturist: [employer]  Tobacco Use   Smoking status: Never   Smokeless tobacco: Never  Vaping Use   Vaping Use: Never used  Substance and Sexual Activity   Alcohol use: Not Currently    Alcohol/week: 2.0 standard drinks    Types: 2 Glasses of wine per week   Drug use: No   Sexual activity: Not on file  Other Topics Concern   Not on file  Social History Narrative   Married- 32 years   2 children- grown daughter- lives in [place]- has 2 children   Grown son lives in [place]- 4 children   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   1-2 cups caffeine daily.   Lives at home with her husband.   Social Determinants of Health   Financial Resource Strain: Not on file  Food Insecurity: Not on file  Transportation Needs: Not on file  Physical Activity: Not on file  Stress: Not on file  Social Connections: Not on file  Intimate Partner Violence: Not on file    Outpatient Medications Prior to Visit  Medication Sig Dispense Refill   allopurinol (ZYLOPRIM) 100 MG tablet TAKE 1 TABLET BY MOUTH  DAILY 90 tablet 3   benzonatate (TESSALON) 200 MG capsule Take 1 capsule (200 mg total) by mouth 3 (three) times daily as needed for cough. 30 capsule 5   carvedilol (COREG) 12.5 MG tablet TAKE 1 TABLET BY MOUTH  TWICE DAILY 180 tablet 4   celecoxib (CELEBREX) 100 MG capsule Take 100 mg by mouth daily as needed for pain.     cholecalciferol (VITAMIN D3) 25 MCG (1000 UNIT) tablet Take 2,000 Units by mouth at bedtime.     colchicine 0.6 MG  tablet Take 2 tabs by mouth now and 1 tab in 1 hour for gout. May repeat tomorrow if gout pain is not improved. 6 tablet 0   famotidine (PEPCID) 40 MG tablet TAKE 1 TABLET BY MOUTH  TWICE DAILY 180 tablet 3   Fluticasone-Umeclidin-Vilant (TRELEGY ELLIPTA) 100-62.5-25 MCG/ACT AEPB Inhale 1 puff then rinse mouth, once daily 180 each 4   gabapentin (NEURONTIN) 300 MG capsule  TAKE 3 CAPSULES BY MOUTH 3  TIMES DAILY 810 capsule 3   latanoprost (XALATAN) 0.005 % ophthalmic solution Place 1 drop into both eyes at bedtime.   11   NYAMYC powder APPLY 1 APPLICATION  TOPICALLY 3 TIMES DAILY (Patient taking differently: Apply 1 application topically 3 (three) times daily as needed (rash).) 180 g 0   ondansetron (ZOFRAN) 4 MG tablet TAKE 1 TABLET BY MOUTH  EVERY 4 TO 6 HOURS AS  NEEDED FOR NAUSEA 90 tablet 0   pantoprazole (PROTONIX) 40 MG tablet Take 1 tablet (40 mg total) by mouth 2 (two) times daily. 180 tablet 3   potassium chloride SA (KLOR-CON) 20 MEQ tablet TAKE 1 TABLET BY MOUTH  DAILY (Patient taking differently: Take 20 mEq by mouth at bedtime.) 90 tablet 1   spironolactone (ALDACTONE) 25 MG tablet TAKE 1 TABLET BY MOUTH  DAILY 90 tablet 3   Tart Cherry 1200 MG CAPS Take 1,200 mg by mouth at bedtime.     timolol (BETIMOL) 0.5 % ophthalmic solution Place 1 drop into both eyes every morning.      torsemide (DEMADEX) 20 MG tablet TAKE 2 TABLETS BY MOUTH  ALTERNATING WITH 1 TABLET  EVERY OTHER DAY 135 tablet 3   No facility-administered medications prior to visit.    Allergies  Allergen Reactions   Entresto [Sacubitril-Valsartan] Other (See Comments)    "Dry mouth"    Review of Systems  Constitutional:        (-) difficulty losing weight      Objective:    Physical Exam Constitutional:      General: She is not in acute distress.    Appearance: Normal appearance. She is not ill-appearing.  HENT:     Head: Normocephalic and atraumatic.     Right Ear: External ear normal.     Left Ear:  External ear normal.  Neurological:     Mental Status: She is alert.  Psychiatric:        Behavior: Behavior normal.        Judgment: Judgment normal.    LMP 06/17/2007  Wt Readings from Last 3 Encounters:  04/25/21 220 lb (99.8 kg)  04/19/21 220 lb (99.8 kg)  04/12/20 220 lb (99.8 kg)    Diabetic Foot Exam - Simple   No data filed    Lab Results  Component Value Date   WBC 5.0 02/15/2021   HGB 12.1 02/15/2021   HCT 36.5 02/15/2021   PLT 264.0 02/15/2021   GLUCOSE 111 (H) 02/15/2021   CHOL 170 12/02/2019   TRIG 140 12/02/2019   HDL 49 (L) 12/02/2019   LDLCALC 97 12/02/2019   ALT 16 02/15/2021   AST 16 02/15/2021   NA 140 02/15/2021   K 4.4 02/15/2021   CL 102 02/15/2021   CREATININE 1.22 (H) 02/15/2021   BUN 19 02/15/2021   CO2 30 02/15/2021   TSH 1.97 02/15/2021   INR 1.0 12/21/2017   HGBA1C 6.9 (H) 02/22/2021    Lab Results  Component Value Date   TSH 1.97 02/15/2021   Lab Results  Component Value Date   WBC 5.0 02/15/2021   HGB 12.1 02/15/2021   HCT 36.5 02/15/2021   MCV 83.1 02/15/2021   PLT 264.0 02/15/2021   Lab Results  Component Value Date   NA 140 02/15/2021   K 4.4 02/15/2021   CO2 30 02/15/2021   GLUCOSE 111 (H) 02/15/2021   BUN 19 02/15/2021   CREATININE 1.22 (H) 02/15/2021  BILITOT 0.6 02/15/2021   ALKPHOS 96 02/15/2021   AST 16 02/15/2021   ALT 16 02/15/2021   PROT 7.4 02/15/2021   ALBUMIN 4.1 02/15/2021   CALCIUM 9.6 02/15/2021   ANIONGAP 8 08/05/2016   GFR 47.35 (L) 02/15/2021   Lab Results  Component Value Date   CHOL 170 12/02/2019   Lab Results  Component Value Date   HDL 49 (L) 12/02/2019   Lab Results  Component Value Date   LDLCALC 97 12/02/2019   Lab Results  Component Value Date   TRIG 140 12/02/2019   Lab Results  Component Value Date   CHOLHDL 3.5 12/02/2019   Lab Results  Component Value Date   HGBA1C 6.9 (H) 02/22/2021       Assessment & Plan:   Problem List Items Addressed This Visit        Unprioritized   Morbid obesity due to excess calories (HCC)    See discussion above re: Ozempic/diet.       Relevant Medications   Semaglutide,0.25 or 0.5MG /DOS, (OZEMPIC, 0.25 OR 0.5 MG/DOSE,) 2 MG/1.5ML SOPN   Controlled type 2 diabetes mellitus without complication, without long-term current use of insulin (HCC) - Primary    Lab Results  Component Value Date   HGBA1C 6.9 (H) 02/22/2021   HGBA1C 6.8 (H) 10/15/2020   HGBA1C 6.0 (H) 12/02/2019   Lab Results  Component Value Date   LDLCALC 97 12/02/2019   CREATININE 1.22 (H) 02/15/2021  She is interested in Ozempic for sugar control and weight loss.  Her daughter has training as a CMA and has used ozempic in the past and will help her with injections.  Will begin 0.25 mg once weekly. Discussed common side effects, diet.  Follow back up in 1 month.       Relevant Medications   Semaglutide,0.25 or 0.5MG /DOS, (OZEMPIC, 0.25 OR 0.5 MG/DOSE,) 2 MG/1.5ML SOPN   Meds ordered this encounter  Medications   Semaglutide,0.25 or 0.5MG /DOS, (OZEMPIC, 0.25 OR 0.5 MG/DOSE,) 2 MG/1.5ML SOPN    Sig: Inject 0.25 mg into the skin once a week.    Dispense:  3 mL    Refill:  0    Order Specific Question:   Supervising Provider    Answer:   Danise Edge A [[id number]]    I discussed the assessment and treatment plan with the patient. The patient was provided an opportunity to ask questions and all were answered. The patient agreed with the plan and demonstrated an understanding of the instructions.   The patient was advised to call back or seek an in-person evaluation if the symptoms worsen or if the condition fails to improve as anticipated.  I,Lyric Barr-McArthur,acting as a Neurosurgeon for [employer], NP.,have documented all relevant documentation on the behalf of Lemont Fillers, NP,as directed by  Lemont Fillers, NP while in the presence of Lemont Fillers, NP.  I provided 20 minutes of face-to-face time during this  encounter.   Lemont Fillers, NP [facility] [phone] (phone) [phone] (fax)  [facility]

## 2021-05-07 NOTE — Assessment & Plan Note (Signed)
Lab Results  Component Value Date   HGBA1C 6.9 (H) 02/22/2021   HGBA1C 6.8 (H) 10/15/2020   HGBA1C 6.0 (H) 12/02/2019   Lab Results  Component Value Date   LDLCALC 97 12/02/2019   CREATININE 1.22 (H) 02/15/2021   She is interested in Ozempic for sugar control and weight loss.  Her daughter has training as a CMA and has used ozempic in the past and will help her with injections.  Will begin 0.25 mg once weekly. Discussed common side effects, diet.  Follow back up in 1 month.

## 2021-05-07 NOTE — Assessment & Plan Note (Signed)
See discussion above re: Ozempic/diet.

## 2021-05-08 ENCOUNTER — Telehealth: Payer: Self-pay | Admitting: Family

## 2021-05-08 NOTE — Telephone Encounter (Signed)
Left message for patient to call back and schedule Medicare Annual Wellness Visit (AWV) in office.  ° °If not able to come in office, please offer to do virtually or by telephone.  Left office number and my jabber #[phone]. ° °Due for AWVI ° °Please schedule at anytime with Nurse Health Advisor. °  °

## 2021-05-16 ENCOUNTER — Telehealth (HOSPITAL_COMMUNITY): Payer: Self-pay | Admitting: Emergency Medicine

## 2021-05-16 NOTE — Telephone Encounter (Signed)
Pt wishing to cancel [facility] appt because shes been sick for last few days.  Rockwell Alexandria RN Navigator Cardiac Imaging [facility] [phone] Office  [phone] Cell

## 2021-05-17 ENCOUNTER — Ambulatory Visit (HOSPITAL_COMMUNITY): Admission: RE | Admit: 2021-05-17 | Payer: Medicare Other | Source: Ambulatory Visit

## 2021-05-23 ENCOUNTER — Ambulatory Visit: Payer: Medicare Other | Admitting: Physician Assistant

## 2021-05-24 ENCOUNTER — Encounter: Payer: Self-pay | Admitting: Family

## 2021-07-03 ENCOUNTER — Telehealth: Payer: Self-pay | Admitting: Family

## 2021-07-03 NOTE — Telephone Encounter (Signed)
Pt stated she has flu like symps but tested neg for covid 1/17. Please advise.

## 2021-07-04 ENCOUNTER — Encounter: Payer: Self-pay | Admitting: Family

## 2021-07-04 ENCOUNTER — Telehealth (INDEPENDENT_AMBULATORY_CARE_PROVIDER_SITE_OTHER): Payer: Medicare Other | Admitting: Medical

## 2021-07-04 ENCOUNTER — Other Ambulatory Visit: Payer: Self-pay | Admitting: Medical

## 2021-07-04 VITALS — BP 101/73 | HR 86

## 2021-07-04 DIAGNOSIS — E119 Type 2 diabetes mellitus without complications: Secondary | ICD-10-CM

## 2021-07-04 DIAGNOSIS — R062 Wheezing: Secondary | ICD-10-CM

## 2021-07-04 DIAGNOSIS — U071 COVID-19: Secondary | ICD-10-CM

## 2021-07-04 DIAGNOSIS — R059 Cough, unspecified: Secondary | ICD-10-CM

## 2021-07-04 MED ORDER — OZEMPIC (0.25 OR 0.5 MG/DOSE) 2 MG/1.5ML ~~LOC~~ SOPN: 0.2500 mg | PEN_INJECTOR | SUBCUTANEOUS | 1 refills | Status: DC

## 2021-07-04 MED ORDER — FLUTICASONE PROPIONATE 50 MCG/ACT NA SUSP: 2.0000 | Freq: Every day | NASAL | 1 refills | Status: DC

## 2021-07-04 MED ORDER — MOLNUPIRAVIR EUA 200MG CAPSULE: 4.0000 | ORAL_CAPSULE | Freq: Two times a day (BID) | ORAL | 0 refills | Status: AC

## 2021-07-04 NOTE — Telephone Encounter (Signed)
Patient scheduled to complete a vv with Ramon Dredge today at 4 mp. She was offered to come in for inperson ov but she said she did not have a way to get here today.

## 2021-07-04 NOTE — Progress Notes (Signed)
° °  Subjective:    Patient ID: Briana Miller, female    DOB: [birth year], 66 y.o.   MRN: [id number]  HPI  Virtual Visit via Video Note  I connected with Briana Miller on 07/04/21 at  4:00 PM EST by a video enabled telemedicine application and verified that I am speaking with the correct person using two identifiers.  Location: Patient: home Provider: office   I discussed the limitations of evaluation and management by telemedicine and the availability of in person appointments. The patient expressed understanding and agreed to proceed.  History of Present Illness: Pt recently diagnosed with covid on   Test used-otc  History of covid vaccine x 3.  Covid risk score- 5  History of covid infection-no  Current symptoms- throat pain on Tuesday.  She has moderate cough stuffy nose.She was on cruise. Came back on Saturday.  Pt 02 sat-pt does not have.     Observations/Objective: General-no acute distress, pleasant, oriented. Lungs- on inspection lungs appear unlabored. Neck- no tracheal deviation or jvd on inspection. Neuro- gross motor function appears intact.   Assessment and Plan: Patient Instructions  COVID infection and patient formally vaccinated x3.  Mild signs and symptoms reported.  Recommend rest, hydration, Tylenol for fever and Flonase for nasal congestion.  Prescribed antiviral molnupiravir.  Explained recommended to start within the 5-day of symptom onset.  Explained emergency authorization use.  Check O2 sat levels daily.  Explained good O2 sat levels and worrisome O2 sat levels.  Signs symptoms worsen or change notify us.  Follow-up in 7 days or sooner if needed.   Time spent with patient today was  23 minutes which consisted of chart revdiew, discussing diagnosis, work up treatment and documentation.   Follow Up Instructions:    I discussed the assessment and treatment plan with the patient. The patient was provided an opportunity to  ask questions and all were answered. The patient agreed with the plan and demonstrated an understanding of the instructions.   The patient was advised to call back or seek an in-person evaluation if the symptoms worsen or if the condition fails to improve as anticipated.     Esperanza Richters, PA-C   Review of Systems  Constitutional:  Positive for fatigue. Negative for chills.  HENT:  Positive for congestion.   Respiratory:  Positive for cough and wheezing. Negative for shortness of breath.        Sporadic wheeze. Pt took inhaler given by pulmonologist and resovled wheeze.      Objective:   Physical Exam        Assessment & Plan:

## 2021-07-04 NOTE — Telephone Encounter (Signed)
Was scheduled to have VV with Percell Miller this afternoon.

## 2021-07-09 NOTE — Patient Instructions (Addendum)
COVID infection and patient formally vaccinated x3.  Mild signs and symptoms reported.  Recommend rest, hydration, Tylenol for fever and Flonase for nasal congestion.  Prescribed antiviral molnupiravir.  Explained recommended to start within the 5-day of symptom onset.  Explained emergency authorization use.  Check O2 sat levels daily.  Explained good O2 sat levels and worrisome O2 sat levels.  Signs symptoms worsen or change notify us.  Follow-up in 7 days or sooner if needed.

## 2021-07-15 ENCOUNTER — Telehealth: Payer: Self-pay | Admitting: Family

## 2021-07-15 NOTE — Telephone Encounter (Signed)
Pt stated she was prescribed ozempic last week, and was wondering if she could switch the pharmacy it was sent to.   Medication: ozempic  Has the patient contacted their pharmacy? Yes.     Preferred Pharmacy: [facility] at [address], [place], [place] [address] [phone]

## 2021-07-16 ENCOUNTER — Other Ambulatory Visit: Payer: Self-pay

## 2021-07-16 ENCOUNTER — Other Ambulatory Visit: Payer: Self-pay | Admitting: Family

## 2021-07-16 DIAGNOSIS — Z1231 Encounter for screening mammogram for malignant neoplasm of breast: Secondary | ICD-10-CM

## 2021-07-16 MED ORDER — OZEMPIC (0.25 OR 0.5 MG/DOSE) 2 MG/1.5ML ~~LOC~~ SOPN: 0.2500 mg | PEN_INJECTOR | SUBCUTANEOUS | 1 refills | Status: DC

## 2021-07-16 NOTE — Telephone Encounter (Signed)
Rx sent to requested pharmacy due to [facility] out of stock

## 2021-07-22 ENCOUNTER — Telehealth (INDEPENDENT_AMBULATORY_CARE_PROVIDER_SITE_OTHER): Payer: Medicare Other | Admitting: Family

## 2021-07-22 VITALS — BP 131/88 | HR 79

## 2021-07-22 DIAGNOSIS — G959 Disease of spinal cord, unspecified: Secondary | ICD-10-CM | POA: Diagnosis not present

## 2021-07-22 DIAGNOSIS — M542 Cervicalgia: Secondary | ICD-10-CM | POA: Insufficient documentation

## 2021-07-22 MED ORDER — METHYLPREDNISOLONE 4 MG PO TABS: ORAL_TABLET | ORAL | 0 refills | Status: DC

## 2021-07-22 NOTE — Assessment & Plan Note (Signed)
New.  Increased numbness/weakness of LUE is concerning given her hx of cervical myelopathy and previous cervical surgery.  Will obtain mri of the C spine for further evaluation. In the meantime, will rx with medrol dose pak. Pt is advised to go to the ER if symptoms worsen. Follow up with me in 2 weeks.

## 2021-07-22 NOTE — Progress Notes (Signed)
Subjective:     Patient ID: Briana Miller, female    DOB: 05-Jun-1956, 66 y.o.   MRN: [id number]  Chief Complaint  Patient presents with   Neck Pain    Patient has been getting neck pain x about 3 weeks   Headache    Complains of strong headaches for about 3 weeks     HPI  Pt reports some anterior neck pain, worse with bring in her chin to her chest.  She reports this pain is different than Reports some phantom smells.  She has been using benadryl without improvement.  She has seen Dr. Danielle Dess.    Reports left arm has increased tingling and is less strong than her right arm now.    Health Maintenance Due  Topic Date Due   FOOT EXAM  Never done   OPHTHALMOLOGY EXAM  Never done   URINE MICROALBUMIN  Never done   Zoster Vaccines- Shingrix (1 of 2) Never done   PAP SMEAR-Modifier  03/22/2018   COVID-19 Vaccine (4 - Booster) 08/24/2020   MAMMOGRAM  06/23/2021    Past Medical History:  Diagnosis Date   Acute systolic congestive heart failure (HCC) 03/01/2014   Allergy    Anemia    Arthritis    Cardiomyopathy (HCC)    Cervical myelopathy (HCC)    CHF (congestive heart failure) (HCC)    GERD (gastroesophageal reflux disease)    Gout    Heart disease    Hyperlipidemia    Hypertension    Leg pain    Overactive bladder     Past Surgical History:  Procedure Laterality Date   ANTERIOR CERVICAL DECOMP/DISCECTOMY FUSION N/A 08/03/2016   Procedure: ANTERIOR CERVICAL DECOMPRESSION/DISCECTOMY FUSION C4-5, C5-6;  Surgeon: Barnett Abu, MD;  Location: [facility] OR;  Service: Neurosurgery;  Laterality: N/A;   COLONOSCOPY     COLONOSCOPY WITH PROPOFOL N/A 02/24/2019   Procedure: COLONOSCOPY WITH PROPOFOL;  Surgeon: Rachael Fee, MD;  Location: [facility];  Service: Endoscopy;  Laterality: N/A;   ESOPHAGOGASTRODUODENOSCOPY (EGD) WITH PROPOFOL N/A 04/25/2021   Procedure: ESOPHAGOGASTRODUODENOSCOPY (EGD) WITH PROPOFOL;  Surgeon: Rachael Fee, MD;  Location: [facility];   Service: Endoscopy;  Laterality: N/A;   KNEE ARTHROSCOPY     2007   ROTATOR CUFF REPAIR Right 03/23/13   TOTAL KNEE ARTHROPLASTY  09/22/2011   Procedure: TOTAL KNEE ARTHROPLASTY;  Surgeon: Raymon Mutton, MD;  Location: [facility] OR;  Service: Orthopedics;  Laterality: Right;    Family History  Problem Relation Age of Onset   Cancer Mother        colon 65 and breast 66- deceased   Colon cancer Mother    Diabetes Father        living   Asthma Father    Hypertension Father    Heart attack Father 92       medical management per pt   Glaucoma Father        had eye transplant   Kidney failure Father        acute renal failure- died 24   Esophageal cancer Neg Hx    Stomach cancer Neg Hx    Rectal cancer Neg Hx     Social History   Socioeconomic History   Marital status: Married    Spouse name: Not on file   Number of children: 2   Years of education: HS   Highest education level: Not on file  Occupational History   Occupation: Acupuncturist:  [employer]  Tobacco Use   Smoking status: Never   Smokeless tobacco: Never  Vaping Use   Vaping Use: Never used  Substance and Sexual Activity   Alcohol use: Not Currently    Alcohol/week: 2.0 standard drinks    Types: 2 Glasses of wine per week   Drug use: No   Sexual activity: Not on file  Other Topics Concern   Not on file  Social History Narrative   Married- 32 years   2 children- grown daughter- lives in [place]- has 2 children   Grown son lives in [place]- 4 children   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   1-2 cups caffeine daily.   Lives at home with her husband.   Social Determinants of Health   Financial Resource Strain: Not on file  Food Insecurity: Not on file  Transportation Needs: Not on file  Physical Activity: Not on file  Stress: Not on file  Social Connections: Not on file  Intimate Partner  Violence: Not on file    Outpatient Medications Prior to Visit  Medication Sig Dispense Refill   allopurinol (ZYLOPRIM) 100 MG tablet TAKE 1 TABLET BY MOUTH  DAILY 90 tablet 3   benzonatate (TESSALON) 200 MG capsule Take 1 capsule (200 mg total) by mouth 3 (three) times daily as needed for cough. 30 capsule 5   carvedilol (COREG) 12.5 MG tablet TAKE 1 TABLET BY MOUTH  TWICE DAILY 180 tablet 4   celecoxib (CELEBREX) 100 MG capsule Take 100 mg by mouth daily as needed for pain.     cholecalciferol (VITAMIN D3) 25 MCG (1000 UNIT) tablet Take 2,000 Units by mouth at bedtime.     colchicine 0.6 MG tablet Take 2 tabs by mouth now and 1 tab in 1 hour for gout. May repeat tomorrow if gout pain is not improved. 6 tablet 0   famotidine (PEPCID) 40 MG tablet TAKE 1 TABLET BY MOUTH  TWICE DAILY 180 tablet 3   fluticasone (FLONASE) 50 MCG/ACT nasal spray Place 2 sprays into both nostrils daily. 16 g 1   Fluticasone-Umeclidin-Vilant (TRELEGY ELLIPTA) 100-62.5-25 MCG/ACT AEPB Inhale 1 puff then rinse mouth, once daily 180 each 4   gabapentin (NEURONTIN) 300 MG capsule TAKE 3 CAPSULES BY MOUTH 3  TIMES DAILY 810 capsule 3   latanoprost (XALATAN) 0.005 % ophthalmic solution Place 1 drop into both eyes at bedtime.   11   NYAMYC powder APPLY 1 APPLICATION  TOPICALLY 3 TIMES DAILY (Patient taking differently: Apply 1 application topically 3 (three) times daily as needed (rash).) 180 g 0   ondansetron (ZOFRAN) 4 MG tablet TAKE 1 TABLET BY MOUTH  EVERY 4 TO 6 HOURS AS  NEEDED FOR NAUSEA 90 tablet 0   pantoprazole (PROTONIX) 40 MG tablet Take 1 tablet (40 mg total) by mouth 2 (two) times daily. 180 tablet 3   potassium chloride SA (KLOR-CON) 20 MEQ tablet TAKE 1 TABLET BY MOUTH  DAILY (Patient taking differently: Take 20 mEq by mouth at bedtime.) 90 tablet 1   Semaglutide,0.25 or 0.5MG /DOS, (OZEMPIC, 0.25 OR 0.5 MG/DOSE,) 2 MG/1.5ML SOPN Inject 0.25 mg into the skin once a week. 3 mL 1   spironolactone (ALDACTONE) 25  MG tablet TAKE 1 TABLET BY MOUTH  DAILY 90 tablet 3   Tart Cherry 1200 MG CAPS Take 1,200 mg by mouth at bedtime.  timolol (BETIMOL) 0.5 % ophthalmic solution Place 1 drop into both eyes every morning.      torsemide (DEMADEX) 20 MG tablet TAKE 2 TABLETS BY MOUTH  ALTERNATING WITH 1 TABLET  EVERY OTHER DAY 135 tablet 3   No facility-administered medications prior to visit.    Allergies  Allergen Reactions   Entresto [Sacubitril-Valsartan] Other (See Comments)    "Dry mouth"    ROS    See HPI Objective:    Physical Exam Constitutional:      Appearance: She is well-developed.  Neurological:     Mental Status: She is alert.  Psychiatric:        Mood and Affect: Mood normal.        Behavior: Behavior normal.    BP 131/88    Pulse 79    LMP 06/17/2007  Wt Readings from Last 3 Encounters:  04/25/21 220 lb (99.8 kg)  04/19/21 220 lb (99.8 kg)  04/12/20 220 lb (99.8 kg)       Assessment & Plan:   Problem List Items Addressed This Visit       Unprioritized   Neck pain    New.  Increased numbness/weakness of LUE is concerning given her hx of cervical myelopathy and previous cervical surgery.  Will obtain mri of the C spine for further evaluation. In the meantime, will rx with medrol dose pak. Pt is advised to go to the ER if symptoms worsen. Follow up with me in 2 weeks.       Relevant Orders   MR Cervical Spine Wo Contrast   Cervical myelopathy (HCC) - Primary   Relevant Orders   MR Cervical Spine Wo Contrast    I am having Briana Miller start on methylPREDNISolone. I am also having her maintain her latanoprost, timolol, pantoprazole, colchicine, torsemide, spironolactone, gabapentin, allopurinol, ondansetron, carvedilol, Nyamyc, potassium chloride SA, Trelegy Ellipta, benzonatate, celecoxib, cholecalciferol, Tart Cherry, famotidine, fluticasone, and Ozempic (0.25 or 0.5 MG/DOSE).  Meds ordered this encounter  Medications   methylPREDNISolone (MEDROL) 4 MG  tablet    Sig: Please follow package instructions    Dispense:  21 tablet    Refill:  0    Order Specific Question:   Supervising Provider    Answer:   Danise Edge A [[id number]]

## 2021-07-24 ENCOUNTER — Telehealth (HOSPITAL_COMMUNITY): Payer: Self-pay | Admitting: *Deleted

## 2021-07-24 NOTE — Assessment & Plan Note (Signed)
Multifactorial cough may reflect ACE inhibitor, heart failure, timolol eyedrops Plan-refill Trelegy, refill benzonatate Perles, CXR

## 2021-07-24 NOTE — Telephone Encounter (Signed)
Attempted to call patient regarding upcoming cardiac MRI appointment. Left message on voicemail with name and callback number  Krrish Freund RN Navigator Cardiac Imaging [facility] [phone] Office [phone] Cell  

## 2021-07-24 NOTE — Assessment & Plan Note (Signed)
Limited mobility, power chair.  We were unable to mobilize her forBody weight today.  She looks heavier than her stated weight of 220 pounds.

## 2021-07-25 ENCOUNTER — Telehealth (HOSPITAL_COMMUNITY): Payer: Self-pay | Admitting: *Deleted

## 2021-07-25 NOTE — Telephone Encounter (Signed)
Patient returning call regarding upcoming cardiac imaging study; pt verbalizes understanding of appt date/time, parking situation and where to check in, and verified current allergies; name and call back number provided for further questions should they arise  Larey Brick RN Navigator Cardiac Imaging [facility] [phone] office [phone] cell  Patient reports a knee replacement and neck fusion but denies claustrophobia.

## 2021-07-26 ENCOUNTER — Ambulatory Visit: Payer: Medicare Other

## 2021-07-26 ENCOUNTER — Ambulatory Visit (HOSPITAL_COMMUNITY)
Admission: RE | Admit: 2021-07-26 | Discharge: 2021-07-26 | Disposition: A | Discharge status: Home / Self Care | Payer: Medicare Other | Source: Ambulatory Visit | Attending: Cardiology | Admitting: Cardiology

## 2021-07-26 ENCOUNTER — Other Ambulatory Visit: Payer: Self-pay

## 2021-07-26 DIAGNOSIS — I428 Other cardiomyopathies: Secondary | ICD-10-CM | POA: Insufficient documentation

## 2021-07-26 MED ORDER — GADOBUTROL 1 MMOL/ML IV SOLN
10.0000 mL | Freq: Once | INTRAVENOUS | Status: AC | PRN
  Administered 2021-07-26: 10 mL via INTRAVENOUS

## 2021-07-26 NOTE — Progress Notes (Signed)
°  Evaluation after Contrast Extravasation  Patient seen and examined immediately after contrast extravasation while in MRI.  Exam: There is mild swelling at the anterior right forearm area.  There is mild erythema. There is no discoloration. There are no blisters. There are no signs of decreased perfusion of the skin.  It is warm to touch( pt was using warm pack prior to assessing) The patient has full ROM in fingers.  Radial pulse is normal.  Per contrast extravasation protocol, I have instructed the patient to keep an ice pack on the area for 20-60 minutes at a time for about 48 hours.   Keep arm elevated as much as possible.   The patient understands to call the radiology department if there is: - increase in pain or swelling - changed or altered sensation - ulceration or blistering - increasing redness - warmth or increasing firmness - decreased tissue perfusion as noted by decreased capillary refill or discoloration of skin - decreased pulses peripheral to site   Shon Hough, AGNP-BC 07/26/2021 11:42 AM

## 2021-07-30 ENCOUNTER — Telehealth: Payer: Self-pay | Admitting: *Deleted

## 2021-07-30 DIAGNOSIS — I428 Other cardiomyopathies: Secondary | ICD-10-CM

## 2021-07-30 MED ORDER — LOSARTAN POTASSIUM 25 MG PO TABS: 25.0000 mg | ORAL_TABLET | Freq: Every day | ORAL | 3 refills | Status: DC

## 2021-07-30 NOTE — Telephone Encounter (Signed)
Spoke with pt, aware of results. New script sent to the pharmacy  Lab orders mailed to the pt  Referral placed

## 2021-07-30 NOTE — Telephone Encounter (Signed)
-----   Message from Lewayne Bunting, MD sent at 07/29/2021  7:57 AM EST ----- Add losartan 25 mg daily (did not tolerate entresto); bmet one week; refer to EP for CRT-D Olga Millers

## 2021-08-01 NOTE — Progress Notes (Signed)
Office Visit    Patient Name: Briana Miller Date of Encounter: 08/02/2021  Primary Care Provider:  Debbrah Alar, NP Primary Cardiologist:  Kirk Ruths, MD  Chief Complaint    66 year old female with a history of chronic systolic heart failure, nonischemic cardiomyopathy, hypertension, hyperlipidemia, type 2 diabetes, cervical myelopathy, GERD, anemia, gout, OSA not on CPAP, obesity, and arthritis who presents for follow-up related to heart failure.  Past Medical History    Past Medical History:  Diagnosis Date   Acute systolic congestive heart failure ([place]) 03/01/2014   Allergy    Anemia    Arthritis    Cardiomyopathy ([place])    Cervical myelopathy (HCC)    CHF (congestive heart failure) (HCC)    GERD (gastroesophageal reflux disease)    Gout    Heart disease    Hyperlipidemia    Hypertension    Leg pain    Overactive bladder    Past Surgical History:  Procedure Laterality Date   ANTERIOR CERVICAL DECOMP/DISCECTOMY FUSION N/A 08/03/2016   Procedure: ANTERIOR CERVICAL DECOMPRESSION/DISCECTOMY FUSION C4-5, C5-6;  Surgeon: Kristeen Miss, MD;  Location: [facility];  Service: Neurosurgery;  Laterality: N/A;   COLONOSCOPY     COLONOSCOPY WITH PROPOFOL N/A 02/24/2019   Procedure: COLONOSCOPY WITH PROPOFOL;  Surgeon: Milus Banister, MD;  Location: [facility];  Service: Endoscopy;  Laterality: N/A;   ESOPHAGOGASTRODUODENOSCOPY (EGD) WITH PROPOFOL N/A 04/25/2021   Procedure: ESOPHAGOGASTRODUODENOSCOPY (EGD) WITH PROPOFOL;  Surgeon: Milus Banister, MD;  Location: [facility];  Service: Endoscopy;  Laterality: N/A;   KNEE ARTHROSCOPY     2007   ROTATOR CUFF REPAIR Right 03/23/13   TOTAL KNEE ARTHROPLASTY  09/22/2011   Procedure: TOTAL KNEE ARTHROPLASTY;  Surgeon: Rudean Haskell, MD;  Location: [facility];  Service: Orthopedics;  Laterality: Right;    Allergies  Allergies  Allergen Reactions   Entresto [Sacubitril-Valsartan] Other (See Comments)    "Dry mouth"     History of Present Illness    66 year old female with the above past medical history including chronic systolic heart failure, nonischemic cardiomyopathy, hypertension, hyperlipidemia, type 2 diabetes, cervical myelopathy, GERD, anemia, gout, OSA not on CPAP, obesity, and arthritis.  Echocardiogram in October 2014 showed EF 40 to 45%, mild LVH, mild LAE.  Nuclear stress test in December 2014 showed EF 51%, no evidence of ischemia.  Coronary CTA in August 2021 showed calcium score of 0 and no evidence of CAD. She was last seen in the office on 07/27/2020 and was stable overall from a cardiac standpoint.  She was cleared for knee surgery at the time. Repeat echocardiogram was recommended.  Echocardiogram in June 2022 showed EF 30 to 35%, moderately decreased LV function, global hypokinesis, mild LV internal cavity dilation, G1 DD, with a PASP, mild to moderate mitral valve regurgitation, no significant change from prior echocardiogram in October 2021.  Follow-up cardiac MRI was recommended to rule out infiltrative cardiomyopathy. Cardiac MRI in February 2023 showed EF 31%, septal dyskinesis consistent with LBBB, late gadolinium enhancement at RV insertion site, nonspecific scar pattern often seen in setting of elevated pulmonary pressures, normal RV size and systolic function, no evidence of infiltrative cardiomyopathy. She was started on losartan (did not tolerate Entresto previously, with recommendations for EP referral for consideration of CRT-D.   She presents today for follow-up.  Since her last visit has been stable overall from a cardiac standpoint.  She does report intermittent atypical chest discomfort, which she describes as a mild heaviness. This occurs  at rest, is reproducible to palpation, and lasts for a few minutes. When she feels the symptoms she takes an aspirin and lays down, with complete resolution of her symptoms. This is unchanged since the time of her coronary CTA in August 2021. She  denies any associated symptoms. She does report an increase in left upper extremity numbness/weakness in the setting of cervical myelopathy. She denies edema, dyspnea, PND, orthopnea, weight gain. Otherwise, she reports feeling well overall and denies any additional concerns or complaints today.  Home Medications    Current Outpatient Medications  Medication Sig Dispense Refill   allopurinol (ZYLOPRIM) 100 MG tablet TAKE 1 TABLET BY MOUTH  DAILY 90 tablet 3   benzonatate (TESSALON) 200 MG capsule Take 1 capsule (200 mg total) by mouth 3 (three) times daily as needed for cough. 30 capsule 5   blood glucose meter kit and supplies KIT Dispense based on patient and insurance preference. Use up to four times daily as directed. 1 each 0   carvedilol (COREG) 12.5 MG tablet TAKE 1 TABLET BY MOUTH  TWICE DAILY 180 tablet 4   celecoxib (CELEBREX) 100 MG capsule Take 100 mg by mouth daily as needed for pain.     cholecalciferol (VITAMIN D3) 25 MCG (1000 UNIT) tablet Take 2,000 Units by mouth at bedtime.     colchicine 0.6 MG tablet Take 2 tabs by mouth now and 1 tab in 1 hour for gout. May repeat tomorrow if gout pain is not improved. 6 tablet 0   empagliflozin (JARDIANCE) 10 MG TABS tablet Take 1 tablet (10 mg total) by mouth daily before breakfast. 30 tablet 2   empagliflozin (JARDIANCE) 10 MG TABS tablet Take 1 tablet (10 mg total) by mouth daily before breakfast. 14 tablet 0   famotidine (PEPCID) 40 MG tablet TAKE 1 TABLET BY MOUTH  TWICE DAILY 180 tablet 3   fluticasone (FLONASE) 50 MCG/ACT nasal spray Place 2 sprays into both nostrils daily. 16 g 1   Fluticasone-Umeclidin-Vilant (TRELEGY ELLIPTA) 100-62.5-25 MCG/ACT AEPB Inhale 1 puff then rinse mouth, once daily 180 each 4   gabapentin (NEURONTIN) 300 MG capsule TAKE 3 CAPSULES BY MOUTH 3  TIMES DAILY 810 capsule 3   latanoprost (XALATAN) 0.005 % ophthalmic solution Place 1 drop into both eyes at bedtime.   11   losartan (COZAAR) 25 MG tablet Take 1  tablet (25 mg total) by mouth daily. 90 tablet 3   NYAMYC powder APPLY 1 APPLICATION  TOPICALLY 3 TIMES DAILY (Patient taking differently: Apply 1 application topically 3 (three) times daily as needed (rash).) 180 g 0   ondansetron (ZOFRAN) 4 MG tablet TAKE 1 TABLET BY MOUTH  EVERY 4 TO 6 HOURS AS  NEEDED FOR NAUSEA 90 tablet 0   pantoprazole (PROTONIX) 40 MG tablet Take 1 tablet (40 mg total) by mouth 2 (two) times daily. 180 tablet 3   potassium chloride SA (KLOR-CON) 20 MEQ tablet TAKE 1 TABLET BY MOUTH  DAILY (Patient taking differently: Take 20 mEq by mouth at bedtime.) 90 tablet 1   Semaglutide,0.25 or 0.5MG/DOS, (OZEMPIC, 0.25 OR 0.5 MG/DOSE,) 2 MG/1.5ML SOPN Inject 0.25 mg into the skin once a week. 3 mL 1   spironolactone (ALDACTONE) 25 MG tablet TAKE 1 TABLET BY MOUTH  DAILY 90 tablet 3   Tart Cherry 1200 MG CAPS Take 1,200 mg by mouth at bedtime.     timolol (BETIMOL) 0.5 % ophthalmic solution Place 1 drop into both eyes every morning.  torsemide (DEMADEX) 20 MG tablet TAKE 2 TABLETS BY MOUTH  ALTERNATING WITH 1 TABLET  EVERY OTHER DAY 135 tablet 3   No current facility-administered medications for this visit.     Review of Systems   She denies palpitations, dyspnea, pnd, orthopnea, n, v, dizziness, syncope, edema, weight gain, or early satiety. All other systems reviewed and are otherwise negative except as noted above.   BP Readings from Last 3 Encounters:  08/02/21 112/82  08/02/21 129/83  07/22/21 131/88   VS:  BP 112/82    Pulse 85    Ht '5\' 6"'  (1.676 m)    LMP 06/17/2007    BMI 35.51 kg/m   GEN: Well nourished, well developed, in no acute distress. HEENT: normal. Neck: Supple, no JVD, carotid bruits, or masses. Cardiac: RRR, no murmurs, rubs, or gallops. No clubbing, cyanosis, edema. Radials/DP/PT 2+ and equal bilaterally.  Respiratory:  Respirations regular and unlabored, clear to auscultation bilaterally. GI: Obese, soft, nontender, nondistended, BS + x 4. MS: no  deformity or atrophy. Skin: warm and dry, no rash. Neuro:  Strength and sensation are intact. Psych: Normal affect.  Accessory Clinical Findings    ECG personally reviewed by me today - Normal sinus rhythm, 85 bpm, LBBB - no acute changes.  Lab Results  Component Value Date   WBC 5.0 02/15/2021   HGB 12.1 02/15/2021   HCT 36.5 02/15/2021   MCV 83.1 02/15/2021   PLT 264.0 02/15/2021   Lab Results  Component Value Date   CREATININE 1.22 (H) 02/15/2021   BUN 19 02/15/2021   NA 140 02/15/2021   K 4.4 02/15/2021   CL 102 02/15/2021   CO2 30 02/15/2021   Lab Results  Component Value Date   ALT 16 02/15/2021   AST 16 02/15/2021   ALKPHOS 96 02/15/2021   BILITOT 0.6 02/15/2021   Lab Results  Component Value Date   CHOL 170 12/02/2019   HDL 49 (L) 12/02/2019   LDLCALC 97 12/02/2019   TRIG 140 12/02/2019   CHOLHDL 3.5 12/02/2019    Lab Results  Component Value Date   HGBA1C 6.9 (H) 02/22/2021    Assessment & Plan    1. Atypical chest pain: She reports intermittent atypical chest discomfort which she describes as a mild heaviness. Occurs at rest, reproducible to palpation, lasts for a few minutes.  When she feels the symptoms she takes aspirin and lays down with complete resolution of her symptoms.  This is unchanged since the time of her coronary CTA in August 2021. Given reproducibility and nonexertional symptoms as well as normal coronary CTA in August 2021, low suspicion for cardiac chest pain. No indication for further ischemic evaluation at this time.   2. Chronic systolic heart failure/nonischemic cardiomyopathy: Prior ischemic evaluation as above, no evidence of CAD. Most recent echo in June 2022 showed EF 30 to 35%, moderately decreased LV function, global hypokinesis, mild LV internal cavity dilation, G1 DD, with a PASP, mild to moderate mitral valve regurgitation, no significant change from prior echocardiogram in October 2021. Follow-up cardiac MRI in 07/2021 showed  EF 31%, septal dyskinesis consistent with LBBB, late gadolinium enhancement at RV insertion site, nonspecific scar pattern often seen in setting of elevated pulmonary pressures, normal RV size and systolic function, no evidence of infiltrative CM. Euvolemic and well compensated on exam. She has an appointment scheduled with Dr. Lovena Le with EP on 09/06/2021 for consideration of CRT-D given persistently low EF. Given stable BP, will start Jardiance 10 mg  daily for further maximization of GDMT.  Discussed possible side effects including hypotension, dizziness, urinary tract or yeast infections.  Samples and patient assistance information provided today. Continue carvedilol, losartan, spironolactone, and torsemide. Continue to monitor home BP.  Check BMET in 1 week.   3. LBBB: EKG today shows normal sinus rhythm, 85 bpm, left bundle branch block, unchanged from prior EKGs.  4. Hypertension: BP well controlled. Continue to monitor BP with addition of Jardiance. Otherwise, continue current antihypertensive regimen as above.   5. Hyperlipidemia: LDL was 97 in June 2021.  Not on statin.  Monitored and managed per PCP.  The 10-year ASCVD risk score (Arnett DK, et al., 2019) is: 12.1%. Fasting lipid panel drawn at PCP office today.  If LDL elevated above 70, consider addition of statin.  6.  H/o cervical myelopathy c/ radiculopathy: She has noticed over the past several months left-handed numbness and tingling in addition to increased left neck pain. She was put on a steroid taper per her PCP, however she states there was no improvement in her symptoms. Given history of prior cervical surgery and cervical myelopathy, recommend f/u with PCP and/or orthopedics.   7. OSA: Declines repeat sleep study, declines CPAP.   8. Type 2 diabetes: A1c was 6.9 in September 2022.  Repeat A1c pending per PCP.  Starting [place] as above.  9. Morbid obesity: Sedentary, wheelchair-bound. Discussed dietary modifications to promote  weight loss.   10. Disposition: F/u as scheduled with EP. F/u with general cardiology in 2 months.   Lenna Sciara, NP 08/02/2021, 1:13 PM

## 2021-08-02 ENCOUNTER — Ambulatory Visit (INDEPENDENT_AMBULATORY_CARE_PROVIDER_SITE_OTHER): Payer: Medicare Other | Admitting: Family

## 2021-08-02 ENCOUNTER — Other Ambulatory Visit: Payer: Self-pay

## 2021-08-02 ENCOUNTER — Encounter: Payer: Self-pay | Admitting: Nurse Practitioner

## 2021-08-02 ENCOUNTER — Ambulatory Visit: Payer: Medicare Other | Admitting: Adult Health

## 2021-08-02 ENCOUNTER — Ambulatory Visit: Payer: Medicare Other | Admitting: Nurse Practitioner

## 2021-08-02 VITALS — BP 112/82 | HR 85 | Ht 66.0 in

## 2021-08-02 DIAGNOSIS — I428 Other cardiomyopathies: Secondary | ICD-10-CM | POA: Diagnosis not present

## 2021-08-02 DIAGNOSIS — K219 Gastro-esophageal reflux disease without esophagitis: Secondary | ICD-10-CM | POA: Diagnosis not present

## 2021-08-02 DIAGNOSIS — E118 Type 2 diabetes mellitus with unspecified complications: Secondary | ICD-10-CM

## 2021-08-02 DIAGNOSIS — M5412 Radiculopathy, cervical region: Secondary | ICD-10-CM

## 2021-08-02 DIAGNOSIS — M109 Gout, unspecified: Secondary | ICD-10-CM | POA: Diagnosis not present

## 2021-08-02 DIAGNOSIS — I10 Essential (primary) hypertension: Secondary | ICD-10-CM

## 2021-08-02 DIAGNOSIS — I447 Left bundle-branch block, unspecified: Secondary | ICD-10-CM | POA: Diagnosis not present

## 2021-08-02 DIAGNOSIS — E785 Hyperlipidemia, unspecified: Secondary | ICD-10-CM | POA: Diagnosis not present

## 2021-08-02 DIAGNOSIS — J309 Allergic rhinitis, unspecified: Secondary | ICD-10-CM

## 2021-08-02 DIAGNOSIS — G959 Disease of spinal cord, unspecified: Secondary | ICD-10-CM | POA: Diagnosis not present

## 2021-08-02 DIAGNOSIS — R0789 Other chest pain: Secondary | ICD-10-CM

## 2021-08-02 DIAGNOSIS — E782 Mixed hyperlipidemia: Secondary | ICD-10-CM

## 2021-08-02 DIAGNOSIS — E119 Type 2 diabetes mellitus without complications: Secondary | ICD-10-CM

## 2021-08-02 DIAGNOSIS — I5022 Chronic systolic (congestive) heart failure: Secondary | ICD-10-CM

## 2021-08-02 DIAGNOSIS — G4733 Obstructive sleep apnea (adult) (pediatric): Secondary | ICD-10-CM | POA: Diagnosis not present

## 2021-08-02 LAB — BASIC METABOLIC PANEL
BUN: 29 mg/dL — ABNORMAL HIGH (ref 6–23)
CO2: 34 mEq/L — ABNORMAL HIGH (ref 19–32)
Calcium: 9.5 mg/dL (ref 8.4–10.5)
Chloride: 100 mEq/L (ref 96–112)
Creatinine, Ser: 1.37 mg/dL — ABNORMAL HIGH (ref 0.40–1.20)
GFR: 41.07 mL/min — ABNORMAL LOW (ref >60.00)
Glucose, Bld: 97 mg/dL (ref 70–99)
Potassium: 4.4 mEq/L (ref 3.5–5.1)
Sodium: 139 mEq/L (ref 135–145)

## 2021-08-02 LAB — HEMOGLOBIN A1C: Hgb A1c MFr Bld: 6.7 % — ABNORMAL HIGH (ref 4.6–6.5)

## 2021-08-02 MED ORDER — BLOOD GLUCOSE MONITOR KIT: PACK | 0 refills | Status: DC

## 2021-08-02 MED ORDER — EMPAGLIFLOZIN 10 MG PO TABS: 10.0000 mg | ORAL_TABLET | Freq: Every day | ORAL | 2 refills | Status: DC

## 2021-08-02 MED ORDER — EMPAGLIFLOZIN 10 MG PO TABS
10.0000 mg | ORAL_TABLET | Freq: Every day | ORAL | 0 refills | Status: DC
Start: 2021-08-02 — End: 2021-08-30

## 2021-08-02 MED ORDER — ONDANSETRON HCL 4 MG PO TABS: ORAL_TABLET | ORAL | 0 refills | Status: DC

## 2021-08-02 NOTE — Assessment & Plan Note (Signed)
Uncontrolled despite maximum therapy. Continue GERD diet, pepcid, protonix.

## 2021-08-02 NOTE — Assessment & Plan Note (Signed)
Clinically compensated.

## 2021-08-02 NOTE — Progress Notes (Signed)
Subjective:     Patient ID: Briana Miller, female    DOB: July 03, 1955, 66 y.o.   MRN: [id number]  Chief Complaint  Patient presents with   Diabetes    Follow up     Diabetes  Patient is in today for follow up.  Had cardiac MRI last week.  Noted LVEF 31%. Cardiology added losartan (she did not tolerate Entresto).   Gout- continues allopurinol 15m daily/   Notes some mucous in the back of her throat x 1 week.   Reports LE is stable on demadex.    DM2- on ozemp. Lab Results  Component Value Date   HGBA1C 6.9 (H) 02/22/2021   HGBA1C 6.8 (H) 10/15/2020   HGBA1C 6.0 (H) 12/02/2019   Lab Results  Component Value Date   LDLCALC 97 12/02/2019   CREATININE 1.22 (H) 02/15/2021     HTN- bp meds include losartan 238m carvdilol 12.5 mg.   BP Readings from Last 3 Encounters:  08/02/21 129/83  07/22/21 131/88  07/04/21 101/73   GERD- maintained on pepcid 4017mnd protonix 49m23md per GI. Still has some symptoms.   Health Maintenance Due  Topic Date Due   FOOT EXAM  Never done   OPHTHALMOLOGY EXAM  Never done   Zoster Vaccines- Shingrix (1 of 2) Never done   PAP SMEAR-Modifier  03/22/2018   COVID-19 Vaccine (4 - Booster) 08/24/2020   MAMMOGRAM  06/23/2021    Past Medical History:  Diagnosis Date   Acute systolic congestive heart failure (HCC)[place]16/2015   Allergy    Anemia    Arthritis    Cardiomyopathy (HCC)[place] Cervical myelopathy (HCC)    CHF (congestive heart failure) (HCC)    GERD (gastroesophageal reflux disease)    Gout    Heart disease    Hyperlipidemia    Hypertension    Leg pain    Overactive bladder     Past Surgical History:  Procedure Laterality Date   ANTERIOR CERVICAL DECOMP/DISCECTOMY FUSION N/A 08/03/2016   Procedure: ANTERIOR CERVICAL DECOMPRESSION/DISCECTOMY FUSION C4-5, C5-6;  Surgeon: HenrKristeen Miss;  [facility] [place];  Laterality: N/A;   COLONOSCOPY     COLONOSCOPY WITH PROPOFOL N/A 02/24/2019    Procedure: COLONOSCOPY WITH PROPOFOL;  Surgeon: JacoMilus Banister;  [facility]: [facility];  Service: Endoscopy;  Laterality: N/A;   ESOPHAGOGASTRODUODENOSCOPY (EGD) WITH PROPOFOL N/A 04/25/2021   Procedure: ESOPHAGOGASTRODUODENOSCOPY (EGD) WITH PROPOFOL;  Surgeon: JacoMilus Banister;  Location: [facility];  Service: Endoscopy;  Laterality: N/A;   KNEE ARTHROSCOPY     2007   ROTATOR CUFF REPAIR Right 03/23/13   TOTAL KNEE ARTHROPLASTY  09/22/2011   Procedure: TOTAL KNEE ARTHROPLASTY;  Surgeon: StepRudean Haskell;  [facility] [place];  Laterality: Right;    Family History  Problem Relation Age of Onset   Cancer Mother        colon 60 a98 breast 54- 95ceased   Colon cancer Mother    Diabetes Father        living   Asthma Father    Hypertension Father    Heart attack Father 79  21   medical management per pt   Glaucoma Father        had eye transplant   Kidney failure Father        acute renal failure- died 92  47sophageal cancer Neg Hx    Stomach cancer Neg Hx  Rectal cancer Neg Hx     Social History   Socioeconomic History   Marital status: Married    Spouse name: Not on file   Number of children: 2   Years of education: HS   Highest education level: Not on file  Occupational History   Occupation: Assembly Financial planner: [employer]  Tobacco Use   Smoking status: Never   Smokeless tobacco: Never  Vaping Use   Vaping Use: Never used  Substance and Sexual Activity   Alcohol use: Not Currently    Alcohol/week: 2.0 standard drinks    Types: 2 Glasses of wine per week   Drug use: No   Sexual activity: Not on file  Other Topics Concern   Not on file  Social History Narrative   Married- 35 years   2 children- grown daughter- lives in [place]- has 2 children   Grown son lives in [place]- 4 children   Works at [employer]- 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   1-2 cups caffeine daily.   Lives at home with her husband.   Social Determinants of Health   Financial Resource Strain: Not on file  Food Insecurity: Not on file  Transportation Needs: Not on file  Physical Activity: Not on file  Stress: Not on file  Social Connections: Not on file  Intimate Partner Violence: Not on file    Outpatient Medications Prior to Visit  Medication Sig Dispense Refill   allopurinol (ZYLOPRIM) 100 MG tablet TAKE 1 TABLET BY MOUTH  DAILY 90 tablet 3   benzonatate (TESSALON) 200 MG capsule Take 1 capsule (200 mg total) by mouth 3 (three) times daily as needed for cough. 30 capsule 5   carvedilol (COREG) 12.5 MG tablet TAKE 1 TABLET BY MOUTH  TWICE DAILY 180 tablet 4   celecoxib (CELEBREX) 100 MG capsule Take 100 mg by mouth daily as needed for pain.     cholecalciferol (VITAMIN D3) 25 MCG (1000 UNIT) tablet Take 2,000 Units by mouth at bedtime.     colchicine 0.6 MG tablet Take 2 tabs by mouth now and 1 tab in 1 hour for gout. May repeat tomorrow if gout pain is not improved. 6 tablet 0   famotidine (PEPCID) 40 MG tablet TAKE 1 TABLET BY MOUTH  TWICE DAILY 180 tablet 3   fluticasone (FLONASE) 50 MCG/ACT nasal spray Place 2 sprays into both nostrils daily. 16 g 1   Fluticasone-Umeclidin-Vilant (TRELEGY ELLIPTA) 100-62.5-25 MCG/ACT AEPB Inhale 1 puff then rinse mouth, once daily 180 each 4   gabapentin (NEURONTIN) 300 MG capsule TAKE 3 CAPSULES BY MOUTH 3  TIMES DAILY 810 capsule 3   latanoprost (XALATAN) 0.005 % ophthalmic solution Place 1 drop into both eyes at bedtime.   11   losartan (COZAAR) 25 MG tablet Take 1 tablet (25 mg total) by mouth daily. 90 tablet 3   methylPREDNISolone (MEDROL) 4 MG tablet Please follow package instructions 21 tablet 0   NYAMYC powder APPLY 1 APPLICATION  TOPICALLY 3 TIMES DAILY (Patient taking differently: Apply 1 application topically 3 (three) times daily as needed (rash).) 180 g 0   pantoprazole (PROTONIX) 40 MG  tablet Take 1 tablet (40 mg total) by mouth 2 (two) times daily. 180 tablet 3   potassium chloride SA (KLOR-CON) 20 MEQ tablet TAKE 1 TABLET BY MOUTH  DAILY (Patient taking differently: Take 20 mEq by  mouth at bedtime.) 90 tablet 1   Semaglutide,0.25 or 0.5MG/DOS, (OZEMPIC, 0.25 OR 0.5 MG/DOSE,) 2 MG/1.5ML SOPN Inject 0.25 mg into the skin once a week. 3 mL 1   spironolactone (ALDACTONE) 25 MG tablet TAKE 1 TABLET BY MOUTH  DAILY 90 tablet 3   Tart Cherry 1200 MG CAPS Take 1,200 mg by mouth at bedtime.     timolol (BETIMOL) 0.5 % ophthalmic solution Place 1 drop into both eyes every morning.      torsemide (DEMADEX) 20 MG tablet TAKE 2 TABLETS BY MOUTH  ALTERNATING WITH 1 TABLET  EVERY OTHER DAY 135 tablet 3   ondansetron (ZOFRAN) 4 MG tablet TAKE 1 TABLET BY MOUTH  EVERY 4 TO 6 HOURS AS  NEEDED FOR NAUSEA 90 tablet 0   No facility-administered medications prior to visit.    Allergies  Allergen Reactions   Entresto [Sacubitril-Valsartan] Other (See Comments)    "Dry mouth"    ROS     Objective:    Physical Exam Constitutional:      General: She is not in acute distress.    Appearance: Normal appearance. She is well-developed.     Comments: Seated in wheelchair  HENT:     Head: Normocephalic and atraumatic.     Right Ear: External ear normal.     Left Ear: External ear normal.  Eyes:     General: No scleral icterus. Neck:     Thyroid: No thyromegaly.  Cardiovascular:     Rate and Rhythm: Normal rate and regular rhythm.     Heart sounds: Normal heart sounds. No murmur heard. Pulmonary:     Effort: Pulmonary effort is normal. No respiratory distress.     Breath sounds: Normal breath sounds. No wheezing.  Musculoskeletal:     Cervical back: Neck supple.     Right lower leg: 2+ Edema present.     Left lower leg: 2+ Edema present.  Skin:    General: Skin is warm and dry.  Neurological:     Mental Status: She is alert and oriented to person, place, and time.  Psychiatric:         Mood and Affect: Mood normal.        Behavior: Behavior normal.        Thought Content: Thought content normal.        Judgment: Judgment normal.    BP 129/83 (BP Location: Right Arm, Patient Position: Sitting, Cuff Size: Normal)    Pulse 88    Temp 98.3 F (36.8 C) (Oral)    Resp 16    Ht '5\' 6"'  (1.676 m)    Wt 220 lb (99.8 kg)    LMP 06/17/2007    SpO2 99%    BMI 35.51 kg/m  Wt Readings from Last 3 Encounters:  08/02/21 220 lb (99.8 kg)  04/25/21 220 lb (99.8 kg)  04/19/21 220 lb (99.8 kg)       Assessment & Plan:   Problem List Items Addressed This Visit       Unprioritized   NICM (nonischemic cardiomyopathy) ([place])    Clinically compensated.       HTN (hypertension)     BP Readings from Last 3 Encounters:  08/02/21 129/83  07/22/21 131/88  07/04/21 101/73   BP at goal. Continue current doses of coreg, losartan.       Gout    Stable on daily allopurinol. Continue same .      GERD (gastroesophageal reflux disease)    Uncontrolled  despite maximum therapy. Continue GERD diet, pepcid, protonix.       Relevant Medications   ondansetron (ZOFRAN) 4 MG tablet   Controlled type 2 diabetes mellitus without complication, without long-term current use of insulin (HCC)    Stable, gave rx for glucometer and discussed glycemic goals. Obtain follow up A1C, continue ozempic      Relevant Orders   Basic metabolic panel   Hemoglobin A1c   Allergic rhinitis    I suggested her post nasal mucous may be due to allergies. Trial of claritin, follow up if no improvement.       Other Visit Diagnoses     Hyperlipidemia, unspecified hyperlipidemia type       Relevant Orders   Lipid panel       I am having Karren Burly start on blood glucose meter kit and supplies. I am also having her maintain her latanoprost, timolol, pantoprazole, colchicine, torsemide, spironolactone, gabapentin, allopurinol, carvedilol, Nyamyc, potassium chloride SA, Trelegy Ellipta,  benzonatate, celecoxib, cholecalciferol, Tart Cherry, famotidine, fluticasone, Ozempic (0.25 or 0.5 MG/DOSE), methylPREDNISolone, losartan, and ondansetron.  Meds ordered this encounter  Medications   blood glucose meter kit and supplies KIT    Sig: Dispense based on patient and insurance preference. Use up to four times daily as directed.    Dispense:  1 each    Refill:  0    Order Specific Question:   Supervising Provider    Answer:   Penni Homans A [id number]    Order Specific Question:   Number of strips    Answer:   100    Order Specific Question:   Number of lancets    Answer:   100   ondansetron (ZOFRAN) 4 MG tablet    Sig: TAKE 1 TABLET BY MOUTH  EVERY 4 TO 6 HOURS AS  NEEDED FOR NAUSEA    Dispense:  90 tablet    Refill:  0    Order Specific Question:   Supervising Provider    Answer:   Penni Homans A [[id number]]

## 2021-08-02 NOTE — Patient Instructions (Addendum)
Please complete lab work prior to leaving.  Goal Sugar readings:    AM fastings 80-110 2 hours after a meal <140

## 2021-08-02 NOTE — Assessment & Plan Note (Signed)
Stable, gave rx for glucometer and discussed glycemic goals. Obtain follow up A1C, continue ozempic

## 2021-08-02 NOTE — Assessment & Plan Note (Signed)
°  BP Readings from Last 3 Encounters:  08/02/21 129/83  07/22/21 131/88  07/04/21 101/73   BP at goal. Continue current doses of coreg, losartan.

## 2021-08-02 NOTE — Assessment & Plan Note (Signed)
I suggested her post nasal mucous may be due to allergies. Trial of claritin, follow up if no improvement.

## 2021-08-02 NOTE — Patient Instructions (Signed)
Medication Instructions:  Start Jardiance 10 mg ( Take 1 Tablet Daily). *If you need a refill on your cardiac medications before your next appointment, please call your pharmacy*   Lab Work: BMET. To Be Done in 1 Week If you have labs (blood work) drawn today and your tests are completely normal, you will receive your results only by: MyChart Message (if you have MyChart) OR A paper copy in the mail If you have any lab test that is abnormal or we need to change your treatment, we will call you to review the results.   Testing/Procedures: No Testing   Follow-Up: At [facility], you and your health needs are our priority.  As part of our continuing mission to provide you with exceptional heart care, we have created designated Provider Care Teams.  These Care Teams include your primary Cardiologist (physician) and Advanced Practice Providers (APPs -  Physician Assistants and Nurse Practitioners) who all work together to provide you with the care you need, when you need it.  We recommend signing up for the patient portal called "MyChart".  Sign up information is provided on this After Visit Summary.  MyChart is used to connect with patients for Virtual Visits (Telemedicine).  Patients are able to view lab/test results, encounter notes, upcoming appointments, etc.  Non-urgent messages can be sent to your provider as well.   To learn more about what you can do with MyChart, go to [email or web address].    Your next appointment:   Keep Scheduled Follow up visit  The format for your next appointment:   In Person  Provider:   Edd Fabian, NP

## 2021-08-02 NOTE — Assessment & Plan Note (Signed)
Stable on daily allopurinol. Continue same.  

## 2021-08-06 ENCOUNTER — Encounter: Payer: Self-pay | Admitting: Family

## 2021-08-15 ENCOUNTER — Encounter: Payer: Self-pay | Admitting: Family

## 2021-08-16 ENCOUNTER — Telehealth: Payer: Self-pay | Admitting: Cardiology

## 2021-08-16 MED ORDER — EMPAGLIFLOZIN 10 MG PO TABS: 10.0000 mg | ORAL_TABLET | Freq: Every day | ORAL | 3 refills | Status: DC

## 2021-08-16 NOTE — Telephone Encounter (Signed)
?*  STAT* If patient is at the pharmacy, call can be transferred to refill team. ? ? ?1. Which medications need to be refilled? (please list name of each medication and dose if known) empagliflozin (JARDIANCE) 10 MG TABS tablet ? ?2. Which pharmacy/location (including street and city if local pharmacy) is medication to be sent to? [facility] on [address]  ? ?3. Do they need a 30 day or 90 day supply? 30 day  ? ?NOTE NEW PHARMACY ?

## 2021-08-19 NOTE — Telephone Encounter (Signed)
Correct. Noted. 

## 2021-08-21 ENCOUNTER — Telehealth: Payer: Self-pay | Admitting: Family

## 2021-08-21 NOTE — Telephone Encounter (Signed)
Left message for patient to call back and schedule Medicare Annual Wellness Visit (AWV) in office.  ° °If not able to come in office, please offer to do virtually or by telephone.  Left office number and my jabber #[phone]. ° °Due for AWVI ° °Please schedule at anytime with Nurse Health Advisor. °  °

## 2021-08-23 ENCOUNTER — Other Ambulatory Visit: Payer: Self-pay

## 2021-08-23 ENCOUNTER — Encounter: Payer: Self-pay | Admitting: Family

## 2021-08-23 ENCOUNTER — Ambulatory Visit (HOSPITAL_COMMUNITY)
Admission: RE | Admit: 2021-08-23 | Discharge: 2021-08-23 | Disposition: A | Discharge status: Home / Self Care | Payer: Medicare Other | Source: Ambulatory Visit | Attending: Family | Admitting: Family

## 2021-08-23 DIAGNOSIS — M542 Cervicalgia: Secondary | ICD-10-CM | POA: Diagnosis not present

## 2021-08-23 DIAGNOSIS — M50023 Cervical disc disorder at C6-C7 level with myelopathy: Secondary | ICD-10-CM | POA: Diagnosis not present

## 2021-08-23 DIAGNOSIS — G959 Disease of spinal cord, unspecified: Secondary | ICD-10-CM | POA: Diagnosis not present

## 2021-08-25 ENCOUNTER — Telehealth: Payer: Self-pay | Admitting: Family

## 2021-08-25 DIAGNOSIS — M542 Cervicalgia: Secondary | ICD-10-CM

## 2021-08-25 NOTE — Telephone Encounter (Signed)
See mychart. Please call pt if she has not reviewed message. ?

## 2021-08-26 ENCOUNTER — Telehealth (INDEPENDENT_AMBULATORY_CARE_PROVIDER_SITE_OTHER): Payer: Medicare Other | Admitting: Family

## 2021-08-26 DIAGNOSIS — J309 Allergic rhinitis, unspecified: Secondary | ICD-10-CM

## 2021-08-26 DIAGNOSIS — M542 Cervicalgia: Secondary | ICD-10-CM

## 2021-08-26 MED ORDER — FLUTICASONE PROPIONATE 50 MCG/ACT NA SUSP: 2.0000 | Freq: Every day | NASAL | 1 refills | Status: DC

## 2021-08-26 NOTE — Assessment & Plan Note (Signed)
MRI noted the following: ? ?IMPRESSION: ?1. Motion degraded examination. ?2. Mild spinal stenosis and likely mild right neural foraminal ?stenosis at C3-4. ?3. Unchanged mild left neural foraminal stenosis at C6-7. ?4. C4-C6 ACDF with myelomalacia at C5-6. ? ?Reviewed findings with pt.  Recommended that she see her neurosurgeon to see if she may be a candidate for ESI.  ?

## 2021-08-26 NOTE — Assessment & Plan Note (Signed)
Uncontrolled. Continue claritin, add flonase.  

## 2021-08-26 NOTE — Progress Notes (Signed)
MyChart Video Visit    Virtual Visit via Video Note   This visit type was conducted due to national recommendations for restrictions regarding the COVID-19 Pandemic (e.g. social distancing) in an effort to limit this patient's exposure and mitigate transmission in our community. This patient is at least at moderate risk for complications without adequate follow up. This format is felt to be most appropriate for this patient at this time. Physical exam was limited by quality of the video and audio technology used for the visit. [name]. was able to get the patient set up on a video visit.  Patient location: Home Patient and provider in visit Provider location: Office  I discussed the limitations of evaluation and management by telemedicine and the availability of in person appointments. The patient expressed understanding and agreed to proceed.  Visit Date: 08/26/2021  Today's healthcare provider: Nance Pear, NP   Subjective:    Patient ID: Briana Miller, female    DOB: [birth year], 66 y.o.   MRN: [id number]  Chief Complaint  Patient presents with   Neck Pain    "To go over MRI results"   Facial Pain    Complains of sinus pain and congestion    HPI Patient is in today for a video visit.  MRI- She would like to discuss the results from the MRI she had on 03/10. She was complaining of increasing neck pain that feels like there is something "crumpled at the left side of the neck." Denies palpable mass. Pain occurs 3-4 times a day. Denies pain or difficulty swallowing. Notes left arm numbness/pain/weakness is unchanged since last visit.   Sinus pain- She complains of increasing sinus congestion with the change in weather. Feels like she is trying to cough up something. She would like a prescription of Flonase.   Past Medical History:  Diagnosis Date   Acute systolic congestive heart failure ([place]) 03/01/2014   Allergy    Anemia    Arthritis    Cardiomyopathy  ([place])    Cervical myelopathy ([facility])    CHF (congestive heart failure) ([facility])    GERD (gastroesophageal reflux disease)    Gout    Heart disease    Hyperlipidemia    Hypertension    Leg pain    Overactive bladder     Past Surgical History:  Procedure Laterality Date   ANTERIOR CERVICAL DECOMP/DISCECTOMY FUSION N/A 08/03/2016   Procedure: ANTERIOR CERVICAL DECOMPRESSION/DISCECTOMY FUSION C4-5, C5-6;  Surgeon: Kristeen Miss, MD;  Location: [facility];  Service: Neurosurgery;  Laterality: N/A;   COLONOSCOPY     COLONOSCOPY WITH PROPOFOL N/A 02/24/2019   Procedure: COLONOSCOPY WITH PROPOFOL;  Surgeon: Milus Banister, MD;  [facility];  Service: Endoscopy;  Laterality: N/A;   ESOPHAGOGASTRODUODENOSCOPY (EGD) WITH PROPOFOL N/A 04/25/2021   Procedure: ESOPHAGOGASTRODUODENOSCOPY (EGD) WITH PROPOFOL;  Surgeon: Milus Banister, MD;  [facility];  Service: Endoscopy;  Laterality: N/A;   KNEE ARTHROSCOPY     2007   ROTATOR CUFF REPAIR Right 03/23/13   TOTAL KNEE ARTHROPLASTY  09/22/2011   Procedure: TOTAL KNEE ARTHROPLASTY;  Surgeon: Rudean Haskell, MD;  Location: [facility];  Service: Orthopedics;  Laterality: Right;    Family History  Problem Relation Age of Onset   Cancer Mother        colon 68 and breast 53- deceased   Colon cancer Mother    Diabetes Father        living   Asthma Father  Hypertension Father    Heart attack Father 56       medical management per pt   Glaucoma Father        had eye transplant   Kidney failure Father        acute renal failure- died 33   Esophageal cancer Neg Hx    Stomach cancer Neg Hx    Rectal cancer Neg Hx     Social History   Socioeconomic History   Marital status: Married    Spouse name: Not on file   Number of children: 2   Years of education: HS   Highest education level: Not on file  Occupational History   Occupation: Assembly Financial planner: [employer]  Tobacco Use   Smoking status: Never   Smokeless tobacco: Never   Vaping Use   Vaping Use: Never used  Substance and Sexual Activity   Alcohol use: Not Currently    Alcohol/week: 2.0 standard drinks    Types: 2 Glasses of wine per week   Drug use: No   Sexual activity: Not on file  Other Topics Concern   Not on file  Social History Narrative   Married- 65 years   2 children- grown daughter- lives in [place]- has 2 children   Grown son lives in [place]- 4 children   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   1-2 cups caffeine daily.   Lives at home with her husband.   Social Determinants of Health   Financial Resource Strain: Not on file  Food Insecurity: Not on file  Transportation Needs: Not on file  Physical Activity: Not on file  Stress: Not on file  Social Connections: Not on file  Intimate Partner Violence: Not on file    Outpatient Medications Prior to Visit  Medication Sig Dispense Refill   allopurinol (ZYLOPRIM) 100 MG tablet TAKE 1 TABLET BY MOUTH  DAILY 90 tablet 3   blood glucose meter kit and supplies KIT Dispense based on patient and insurance preference. Use up to four times daily as directed. 1 each 0   carvedilol (COREG) 12.5 MG tablet TAKE 1 TABLET BY MOUTH  TWICE DAILY 180 tablet 4   celecoxib (CELEBREX) 100 MG capsule Take 100 mg by mouth daily as needed for pain.     cholecalciferol (VITAMIN D3) 25 MCG (1000 UNIT) tablet Take 2,000 Units by mouth at bedtime.     colchicine 0.6 MG tablet Take 2 tabs by mouth now and 1 tab in 1 hour for gout. May repeat tomorrow if gout pain is not improved. 6 tablet 0   empagliflozin (JARDIANCE) 10 MG TABS tablet Take 1 tablet (10 mg total) by mouth daily before breakfast. 14 tablet 0   empagliflozin (JARDIANCE) 10 MG TABS tablet Take 1 tablet (10 mg total) by mouth daily before breakfast. 30 tablet 3   famotidine (PEPCID) 40 MG tablet TAKE 1 TABLET BY MOUTH  TWICE DAILY 180 tablet 3    Fluticasone-Umeclidin-Vilant (TRELEGY ELLIPTA) 100-62.5-25 MCG/ACT AEPB Inhale 1 puff then rinse mouth, once daily 180 each 4   gabapentin (NEURONTIN) 300 MG capsule TAKE 3 CAPSULES BY MOUTH 3  TIMES DAILY 810 capsule 3   latanoprost (XALATAN) 0.005 % ophthalmic solution Place 1 drop into both eyes at bedtime.   11   losartan (COZAAR) 25 MG tablet Take 1 tablet (25 mg total) by  mouth daily. 90 tablet 3   NYAMYC powder APPLY 1 APPLICATION  TOPICALLY 3 TIMES DAILY (Patient taking differently: Apply 1 application. topically 3 (three) times daily as needed (rash).) 180 g 0   ondansetron (ZOFRAN) 4 MG tablet TAKE 1 TABLET BY MOUTH  EVERY 4 TO 6 HOURS AS  NEEDED FOR NAUSEA 90 tablet 0   pantoprazole (PROTONIX) 40 MG tablet Take 1 tablet (40 mg total) by mouth 2 (two) times daily. 180 tablet 3   potassium chloride SA (KLOR-CON) 20 MEQ tablet TAKE 1 TABLET BY MOUTH  DAILY (Patient taking differently: Take 20 mEq by mouth at bedtime.) 90 tablet 1   Semaglutide,0.25 or 0.5MG/DOS, (OZEMPIC, 0.25 OR 0.5 MG/DOSE,) 2 MG/1.5ML SOPN Inject 0.25 mg into the skin once a week. 3 mL 1   spironolactone (ALDACTONE) 25 MG tablet TAKE 1 TABLET BY MOUTH  DAILY 90 tablet 3   Tart Cherry 1200 MG CAPS Take 1,200 mg by mouth at bedtime.     timolol (BETIMOL) 0.5 % ophthalmic solution Place 1 drop into both eyes every morning.      torsemide (DEMADEX) 20 MG tablet TAKE 2 TABLETS BY MOUTH  ALTERNATING WITH 1 TABLET  EVERY OTHER DAY 135 tablet 3   benzonatate (TESSALON) 200 MG capsule Take 1 capsule (200 mg total) by mouth 3 (three) times daily as needed for cough. 30 capsule 5   fluticasone (FLONASE) 50 MCG/ACT nasal spray Place 2 sprays into both nostrils daily. 16 g 1   No facility-administered medications prior to visit.    Allergies  Allergen Reactions   Entresto [Sacubitril-Valsartan] Other (See Comments)    "Dry mouth"    Review of Systems  Constitutional:  Negative for fever.  HENT:  Positive for congestion.    Respiratory:  Negative for cough.   Cardiovascular:  Negative for chest pain.  Gastrointestinal:  Negative for vomiting.  Genitourinary:  Negative for dysuria.  Musculoskeletal:  Positive for neck pain.  Skin:  Negative for rash.  Neurological:  Negative for dizziness.  Psychiatric/Behavioral:  Negative for depression.       Objective:    Physical Exam Constitutional:      Appearance: Normal appearance. She is not ill-appearing.  Neurological:     Mental Status: She is alert and oriented to person, place, and time.  Psychiatric:        Behavior: Behavior normal.        Judgment: Judgment normal.    LMP 06/17/2007  Wt Readings from Last 3 Encounters:  08/02/21 220 lb (99.8 kg)  04/25/21 220 lb (99.8 kg)  04/19/21 220 lb (99.8 kg)    Diabetic Foot Exam - Simple   No data filed    Lab Results  Component Value Date   WBC 5.0 02/15/2021   HGB 12.1 02/15/2021   HCT 36.5 02/15/2021   PLT 264.0 02/15/2021   GLUCOSE 97 08/02/2021   CHOL 170 12/02/2019   TRIG 140 12/02/2019   HDL 49 (L) 12/02/2019   LDLCALC 97 12/02/2019   ALT 16 02/15/2021   AST 16 02/15/2021   NA 139 08/02/2021   K 4.4 08/02/2021   CL 100 08/02/2021   CREATININE 1.37 (H) 08/02/2021   BUN 29 (H) 08/02/2021   CO2 34 (H) 08/02/2021   TSH 1.97 02/15/2021   INR 1.0 12/21/2017   HGBA1C 6.7 (H) 08/02/2021    Lab Results  Component Value Date   TSH 1.97 02/15/2021   Lab Results  Component Value Date  WBC 5.0 02/15/2021   HGB 12.1 02/15/2021   HCT 36.5 02/15/2021   MCV 83.1 02/15/2021   PLT 264.0 02/15/2021   Lab Results  Component Value Date   NA 139 08/02/2021   K 4.4 08/02/2021   CO2 34 (H) 08/02/2021   GLUCOSE 97 08/02/2021   BUN 29 (H) 08/02/2021   CREATININE 1.37 (H) 08/02/2021   BILITOT 0.6 02/15/2021   ALKPHOS 96 02/15/2021   AST 16 02/15/2021   ALT 16 02/15/2021   PROT 7.4 02/15/2021   ALBUMIN 4.1 02/15/2021   CALCIUM 9.5 08/02/2021   ANIONGAP 8 08/05/2016   GFR 41.07  (L) 08/02/2021   Lab Results  Component Value Date   CHOL 170 12/02/2019   Lab Results  Component Value Date   HDL 49 (L) 12/02/2019   Lab Results  Component Value Date   LDLCALC 97 12/02/2019   Lab Results  Component Value Date   TRIG 140 12/02/2019   Lab Results  Component Value Date   CHOLHDL 3.5 12/02/2019   Lab Results  Component Value Date   HGBA1C 6.7 (H) 08/02/2021       Assessment & Plan:   Problem List Items Addressed This Visit       Unprioritized   Neck pain - Primary    MRI noted the following:  IMPRESSION: 1. Motion degraded examination. 2. Mild spinal stenosis and likely mild right neural foraminal stenosis at C3-4. 3. Unchanged mild left neural foraminal stenosis at C6-7. 4. C4-C6 ACDF with myelomalacia at C5-6.  Reviewed findings with pt.  Recommended that she see her neurosurgeon to see if she may be a candidate for ESI.       Allergic rhinitis    Uncontrolled. Continue claritin, add flonase.       Meds ordered this encounter  Medications   fluticasone (FLONASE) 50 MCG/ACT nasal spray    Sig: Place 2 sprays into both nostrils daily.    Dispense:  16 g    Refill:  1    Order Specific Question:   Supervising Provider    Answer:   Penni Homans A [[id number]]    I discussed the assessment and treatment plan with the patient. The patient was provided an opportunity to ask questions and all were answered. The patient agreed with the plan and demonstrated an understanding of the instructions.   The patient was advised to call back or seek an in-person evaluation if the symptoms worsen or if the condition fails to improve as anticipated.   I,Zite Okoli,acting as a Education administrator for [employer], NP.,have documented all relevant documentation on the behalf of Nance Pear, NP,as directed by  Nance Pear, NP while in the presence of Nance Pear, NP.  Nance Pear, NP [employer] at [employer] [phone] (phone) [phone] (fax)  [place]

## 2021-08-29 ENCOUNTER — Other Ambulatory Visit: Payer: Self-pay | Admitting: Family

## 2021-08-29 NOTE — Telephone Encounter (Signed)
Refill request for ondansetron 4mg  and nystatin powder. Please advise.  ?

## 2021-08-30 ENCOUNTER — Other Ambulatory Visit: Payer: Self-pay | Admitting: Family

## 2021-08-30 ENCOUNTER — Encounter: Payer: Self-pay | Admitting: Family

## 2021-08-30 MED ORDER — FLUCONAZOLE 150 MG PO TABS: 150.0000 mg | ORAL_TABLET | Freq: Every day | ORAL | 0 refills | Status: DC

## 2021-09-06 ENCOUNTER — Encounter: Payer: Self-pay | Admitting: Internal Medicine

## 2021-09-06 ENCOUNTER — Ambulatory Visit
Admission: RE | Admit: 2021-09-06 | Discharge: 2021-09-06 | Disposition: A | Discharge status: Home / Self Care | Payer: Medicare Other | Source: Ambulatory Visit | Attending: Family | Admitting: Family

## 2021-09-06 ENCOUNTER — Ambulatory Visit: Payer: Medicare Other | Admitting: Internal Medicine

## 2021-09-06 ENCOUNTER — Other Ambulatory Visit: Payer: Self-pay

## 2021-09-06 VITALS — BP 114/70 | HR 72 | Ht 66.0 in | Wt 225.0 lb

## 2021-09-06 DIAGNOSIS — I447 Left bundle-branch block, unspecified: Secondary | ICD-10-CM | POA: Diagnosis not present

## 2021-09-06 DIAGNOSIS — I428 Other cardiomyopathies: Secondary | ICD-10-CM

## 2021-09-06 DIAGNOSIS — Z1231 Encounter for screening mammogram for malignant neoplasm of breast: Secondary | ICD-10-CM

## 2021-09-06 LAB — CBC WITH DIFFERENTIAL/PLATELET
Basophils Absolute: 0 10*3/uL (ref 0.0–0.2)
Basos: 1 %
EOS (ABSOLUTE): 0.2 10*3/uL (ref 0.0–0.4)
Eos: 5 %
Hematocrit: 39.8 % (ref 34.0–46.6)
Hemoglobin: 13.1 g/dL (ref 11.1–15.9)
Immature Grans (Abs): 0 10*3/uL (ref 0.0–0.1)
Immature Granulocytes: 0 %
Lymphocytes Absolute: 1.5 10*3/uL (ref 0.7–3.1)
Lymphs: 31 %
MCH: 27.9 pg (ref 26.6–33.0)
MCHC: 32.9 g/dL (ref 31.5–35.7)
MCV: 85 fL (ref 79–97)
Monocytes Absolute: 0.7 10*3/uL (ref 0.1–0.9)
Monocytes: 14 %
Neutrophils Absolute: 2.3 10*3/uL (ref 1.4–7.0)
Neutrophils: 49 %
Platelets: 270 10*3/uL (ref 150–450)
RBC: 4.69 x10E6/uL (ref 3.77–5.28)
RDW: 14.4 % (ref 11.7–15.4)
WBC: 4.7 10*3/uL (ref 3.4–10.8)

## 2021-09-06 LAB — BASIC METABOLIC PANEL
BUN/Creatinine Ratio: 16 (ref 12–28)
BUN: 25 mg/dL (ref 8–27)
CO2: 23 mmol/L (ref 20–29)
Calcium: 9.9 mg/dL (ref 8.7–10.3)
Chloride: 102 mmol/L (ref 96–106)
Creatinine, Ser: 1.56 mg/dL — ABNORMAL HIGH (ref 0.57–1.00)
Glucose: 106 mg/dL — ABNORMAL HIGH (ref 70–99)
Potassium: 4.5 mmol/L (ref 3.5–5.2)
Sodium: 139 mmol/L (ref 134–144)
eGFR: 37 mL/min/{1.73_m2} — ABNORMAL LOW (ref >59)

## 2021-09-06 NOTE — H&P (View-Only) (Signed)
? ? ? ? ?HPI ?Briana Miller is referred from Dr. Aundra Dubin for evaluation of chronic systolic heart failure to consider Biv ICD insertion. She has a h/o chronic systolic heart failure with class 2-3 symptoms despite maximal medical therapy. She has not had syncope. She has a h/o LBBB and her EF by CMRI is 31%.   ?Allergies  ?Allergen Reactions  ? Entresto [Sacubitril-Valsartan] Other (See Comments)  ?  "Dry mouth"  ? ? ? ?Current Outpatient Medications  ?Medication Sig Dispense Refill  ? allopurinol (ZYLOPRIM) 100 MG tablet TAKE 1 TABLET BY MOUTH  DAILY (Patient taking differently: Take 100 mg by mouth as needed.) 90 tablet 3  ? blood glucose meter kit and supplies KIT Dispense based on patient and insurance preference. Use up to four times daily as directed. 1 each 0  ? carvedilol (COREG) 12.5 MG tablet TAKE 1 TABLET BY MOUTH  TWICE DAILY 180 tablet 4  ? celecoxib (CELEBREX) 100 MG capsule Take 100 mg by mouth daily as needed for pain.    ? cholecalciferol (VITAMIN D3) 25 MCG (1000 UNIT) tablet Take 2,000 Units by mouth at bedtime.    ? empagliflozin (JARDIANCE) 10 MG TABS tablet Take 1 tablet (10 mg total) by mouth daily before breakfast. 30 tablet 3  ? famotidine (PEPCID) 40 MG tablet TAKE 1 TABLET BY MOUTH  TWICE DAILY 180 tablet 3  ? fluconazole (DIFLUCAN) 150 MG tablet Take 1 tablet (150 mg total) by mouth daily. 1 tablet 0  ? fluticasone (FLONASE) 50 MCG/ACT nasal spray Place 2 sprays into both nostrils daily. 16 g 1  ? gabapentin (NEURONTIN) 300 MG capsule TAKE 3 CAPSULES BY MOUTH 3  TIMES DAILY 810 capsule 3  ? latanoprost (XALATAN) 0.005 % ophthalmic solution Place 1 drop into both eyes at bedtime.   11  ? losartan (COZAAR) 25 MG tablet Take 1 tablet (25 mg total) by mouth daily. 90 tablet 3  ? nystatin (MYCOSTATIN/NYSTOP) powder Apply 1 application. topically 3 (three) times daily as needed (rash). 60 g 1  ? ondansetron (ZOFRAN) 4 MG tablet TAKE 1 TABLET BY MOUTH EVERY 4  TO 6 HOURS AS NEEDED FOR NAUSEA 90  tablet 0  ? pantoprazole (PROTONIX) 40 MG tablet Take 1 tablet (40 mg total) by mouth 2 (two) times daily. 180 tablet 3  ? potassium chloride SA (KLOR-CON) 20 MEQ tablet TAKE 1 TABLET BY MOUTH  DAILY (Patient taking differently: Take 20 mEq by mouth at bedtime.) 90 tablet 1  ? Semaglutide,0.25 or 0.5MG/DOS, (OZEMPIC, 0.25 OR 0.5 MG/DOSE,) 2 MG/1.5ML SOPN Inject 0.25 mg into the skin once a week. 3 mL 1  ? spironolactone (ALDACTONE) 25 MG tablet TAKE 1 TABLET BY MOUTH  DAILY 90 tablet 3  ? Tart Cherry 1200 MG CAPS Take 1,200 mg by mouth at bedtime.    ? timolol (BETIMOL) 0.5 % ophthalmic solution Place 1 drop into both eyes every morning.     ? torsemide (DEMADEX) 20 MG tablet TAKE 2 TABLETS BY MOUTH  ALTERNATING WITH 1 TABLET  EVERY OTHER DAY 135 tablet 3  ? ?No current facility-administered medications for this visit.  ? ? ? ?Past Medical History:  ?Diagnosis Date  ? Acute systolic congestive heart failure ([place]) 03/01/2014  ? Allergy   ? Anemia   ? Arthritis   ? Cardiomyopathy ([place])   ? Cervical myelopathy ([place])   ? CHF (congestive heart failure) ([place])   ? GERD (gastroesophageal reflux disease)   ? Gout   ?  Heart disease   ? Hyperlipidemia   ? Hypertension   ? Leg pain   ? Overactive bladder   ? ? ?ROS: ? ? All systems reviewed and negative except as noted in the HPI. ? ? ?Past Surgical History:  ?Procedure Laterality Date  ? ANTERIOR CERVICAL DECOMP/DISCECTOMY FUSION N/A 08/03/2016  ? Procedure: ANTERIOR CERVICAL DECOMPRESSION/DISCECTOMY FUSION C4-5, C5-6;  Surgeon: Kristeen Miss, MD;  Location: [facility];  Service: Neurosurgery;  Laterality: N/A;  ? COLONOSCOPY    ? COLONOSCOPY WITH PROPOFOL N/A 02/24/2019  ? Procedure: COLONOSCOPY WITH PROPOFOL;  Surgeon: Milus Banister, MD;  [facility];  Service: Endoscopy;  Laterality: N/A;  ? ESOPHAGOGASTRODUODENOSCOPY (EGD) WITH PROPOFOL N/A 04/25/2021  ? Procedure: ESOPHAGOGASTRODUODENOSCOPY (EGD) WITH PROPOFOL;  Surgeon: Milus Banister, MD;  [facility];   Service: Endoscopy;  Laterality: N/A;  ? KNEE ARTHROSCOPY    ? 2007  ? ROTATOR CUFF REPAIR Right 03/23/13  ? TOTAL KNEE ARTHROPLASTY  09/22/2011  ? Procedure: TOTAL KNEE ARTHROPLASTY;  Surgeon: Rudean Haskell, MD;  Location: [facility];  Service: Orthopedics;  Laterality: Right;  ? ? ? ?Family History  ?Problem Relation Age of Onset  ? Cancer Mother   ?     colon 47 and breast 45- deceased  ? Colon cancer Mother   ? Diabetes Father   ?     living  ? Asthma Father   ? Hypertension Father   ? Heart attack Father 20  ?     medical management per pt  ? Glaucoma Father   ?     had eye transplant  ? Kidney failure Father   ?     acute renal failure- died 82  ? Esophageal cancer Neg Hx   ? Stomach cancer Neg Hx   ? Rectal cancer Neg Hx   ? ? ? ?Social History  ? ?Socioeconomic History  ? Marital status: Married  ?  Spouse name: Not on file  ? Number of children: 2  ? Years of education: HS  ? Highest education level: Not on file  ?Occupational History  ? Occupation: [employer]  ?  Employer: [employer]  ?Tobacco Use  ? Smoking status: Never  ? Smokeless tobacco: Never  ?Vaping Use  ? Vaping Use: Never used  ?Substance and Sexual Activity  ? Alcohol use: Not Currently  ?  Alcohol/week: 2.0 standard drinks  ?  Types: 2 Glasses of wine per week  ? Drug use: No  ? Sexual activity: Not on file  ?Other Topics Concern  ? Not on file  ?Social History Narrative  ? Married- 32 years  ? 2 children- grown daughter- lives in [place]- has 2 children  ? Grown son lives in [place]- 4 children  ? 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 Enjoys watching TV, sleeping  ? Completed some college  ? Left-handed.  ? 1-2 cups caffeine daily.  ? Lives at home with her husband.  ? ?Social Determinants of Health  ? ?Financial Resource Strain: Not on file  ?Food Insecurity: Not on file  ?Transportation Needs: Not on file  ?Physical Activity: Not on file  ?Stress: Not on file  ?Social Connections: Not on file  ?Intimate  Partner Violence: Not on file  ? ? ? ?BP 114/70   Pulse 72   Ht _0  (1.676 m)   Wt 225 lb (102.1 kg)   LMP 06/17/2007   SpO2 98%  BMI 36.32 kg/m?  ? ?Physical Exam: ? ?Well appearing NAD ?HEENT: Unremarkable ?Neck:  No JVD, no thyromegally ?Lymphatics:  No adenopathy ?Back:  No CVA tenderness ?Lungs:  Clear with no wheezes ?HEART:  Regular rate rhythm, no murmurs, no rubs, no clicks ?Abd:  soft, positive bowel sounds, no organomegally, no rebound, no guarding ?Ext:  2 plus pulses, no edema, no cyanosis, no clubbing ?Skin:  No rashes no nodules ?Neuro:  CN II through XII intact, motor grossly intact ? ?EKG - nsr with LBBB ? ?Assess/Plan:  ?Chronic systolic heart failure - her symptoms are class 2-3 and I have discussed the treatment options in detail. The risks/benefits/goals/expectations of Biv ICD insertion were reviewed and she wishes to proceed. ?Obesity - she is encouraged to lose weight. ? ?Carleene Overlie Zalma Channing,MD ?

## 2021-09-06 NOTE — Progress Notes (Signed)
? ? ? ? ?HPI ?Briana Miller is referred from Dr. Aundra Dubin for evaluation of chronic systolic heart failure to consider Biv ICD insertion. She has a h/o chronic systolic heart failure with class 2-3 symptoms despite maximal medical therapy. She has not had syncope. She has a h/o LBBB and her EF by CMRI is 31%.   ?Allergies  ?Allergen Reactions  ? Entresto [Sacubitril-Valsartan] Other (See Comments)  ?  "Dry mouth"  ? ? ? ?Current Outpatient Medications  ?Medication Sig Dispense Refill  ? allopurinol (ZYLOPRIM) 100 MG tablet TAKE 1 TABLET BY MOUTH  DAILY (Patient taking differently: Take 100 mg by mouth as needed.) 90 tablet 3  ? blood glucose meter kit and supplies KIT Dispense based on patient and insurance preference. Use up to four times daily as directed. 1 each 0  ? carvedilol (COREG) 12.5 MG tablet TAKE 1 TABLET BY MOUTH  TWICE DAILY 180 tablet 4  ? celecoxib (CELEBREX) 100 MG capsule Take 100 mg by mouth daily as needed for pain.    ? cholecalciferol (VITAMIN D3) 25 MCG (1000 UNIT) tablet Take 2,000 Units by mouth at bedtime.    ? empagliflozin (JARDIANCE) 10 MG TABS tablet Take 1 tablet (10 mg total) by mouth daily before breakfast. 30 tablet 3  ? famotidine (PEPCID) 40 MG tablet TAKE 1 TABLET BY MOUTH  TWICE DAILY 180 tablet 3  ? fluconazole (DIFLUCAN) 150 MG tablet Take 1 tablet (150 mg total) by mouth daily. 1 tablet 0  ? fluticasone (FLONASE) 50 MCG/ACT nasal spray Place 2 sprays into both nostrils daily. 16 g 1  ? gabapentin (NEURONTIN) 300 MG capsule TAKE 3 CAPSULES BY MOUTH 3  TIMES DAILY 810 capsule 3  ? latanoprost (XALATAN) 0.005 % ophthalmic solution Place 1 drop into both eyes at bedtime.   11  ? losartan (COZAAR) 25 MG tablet Take 1 tablet (25 mg total) by mouth daily. 90 tablet 3  ? nystatin (MYCOSTATIN/NYSTOP) powder Apply 1 application. topically 3 (three) times daily as needed (rash). 60 g 1  ? ondansetron (ZOFRAN) 4 MG tablet TAKE 1 TABLET BY MOUTH EVERY 4  TO 6 HOURS AS NEEDED FOR NAUSEA 90  tablet 0  ? pantoprazole (PROTONIX) 40 MG tablet Take 1 tablet (40 mg total) by mouth 2 (two) times daily. 180 tablet 3  ? potassium chloride SA (KLOR-CON) 20 MEQ tablet TAKE 1 TABLET BY MOUTH  DAILY (Patient taking differently: Take 20 mEq by mouth at bedtime.) 90 tablet 1  ? Semaglutide,0.25 or 0.5MG/DOS, (OZEMPIC, 0.25 OR 0.5 MG/DOSE,) 2 MG/1.5ML SOPN Inject 0.25 mg into the skin once a week. 3 mL 1  ? spironolactone (ALDACTONE) 25 MG tablet TAKE 1 TABLET BY MOUTH  DAILY 90 tablet 3  ? Tart Cherry 1200 MG CAPS Take 1,200 mg by mouth at bedtime.    ? timolol (BETIMOL) 0.5 % ophthalmic solution Place 1 drop into both eyes every morning.     ? torsemide (DEMADEX) 20 MG tablet TAKE 2 TABLETS BY MOUTH  ALTERNATING WITH 1 TABLET  EVERY OTHER DAY 135 tablet 3  ? ?No current facility-administered medications for this visit.  ? ? ? ?Past Medical History:  ?Diagnosis Date  ? Acute systolic congestive heart failure ([place]) 03/01/2014  ? Allergy   ? Anemia   ? Arthritis   ? Cardiomyopathy ([place])   ? Cervical myelopathy ([place])   ? CHF (congestive heart failure) ([place])   ? GERD (gastroesophageal reflux disease)   ? Gout   ?  Heart disease   ? Hyperlipidemia   ? Hypertension   ? Leg pain   ? Overactive bladder   ? ? ?ROS: ? ? All systems reviewed and negative except as noted in the HPI. ? ? ?Past Surgical History:  ?Procedure Laterality Date  ? ANTERIOR CERVICAL DECOMP/DISCECTOMY FUSION N/A 08/03/2016  ? Procedure: ANTERIOR CERVICAL DECOMPRESSION/DISCECTOMY FUSION C4-5, C5-6;  Surgeon: Kristeen Miss, MD;  Location: [facility];  Service: Neurosurgery;  Laterality: N/A;  ? COLONOSCOPY    ? COLONOSCOPY WITH PROPOFOL N/A 02/24/2019  ? Procedure: COLONOSCOPY WITH PROPOFOL;  Surgeon: Milus Banister, MD;  [facility];  Service: Endoscopy;  Laterality: N/A;  ? ESOPHAGOGASTRODUODENOSCOPY (EGD) WITH PROPOFOL N/A 04/25/2021  ? Procedure: ESOPHAGOGASTRODUODENOSCOPY (EGD) WITH PROPOFOL;  Surgeon: Milus Banister, MD;  [facility];   Service: Endoscopy;  Laterality: N/A;  ? KNEE ARTHROSCOPY    ? 2007  ? ROTATOR CUFF REPAIR Right 03/23/13  ? TOTAL KNEE ARTHROPLASTY  09/22/2011  ? Procedure: TOTAL KNEE ARTHROPLASTY;  Surgeon: Rudean Haskell, MD;  Location: [facility];  Service: Orthopedics;  Laterality: Right;  ? ? ? ?Family History  ?Problem Relation Age of Onset  ? Cancer Mother   ?     colon 47 and breast 45- deceased  ? Colon cancer Mother   ? Diabetes Father   ?     living  ? Asthma Father   ? Hypertension Father   ? Heart attack Father 20  ?     medical management per pt  ? Glaucoma Father   ?     had eye transplant  ? Kidney failure Father   ?     acute renal failure- died 82  ? Esophageal cancer Neg Hx   ? Stomach cancer Neg Hx   ? Rectal cancer Neg Hx   ? ? ? ?Social History  ? ?Socioeconomic History  ? Marital status: Married  ?  Spouse name: Not on file  ? Number of children: 2  ? Years of education: HS  ? Highest education level: Not on file  ?Occupational History  ? Occupation: [employer]  ?  Employer: [employer]  ?Tobacco Use  ? Smoking status: Never  ? Smokeless tobacco: Never  ?Vaping Use  ? Vaping Use: Never used  ?Substance and Sexual Activity  ? Alcohol use: Not Currently  ?  Alcohol/week: 2.0 standard drinks  ?  Types: 2 Glasses of wine per week  ? Drug use: No  ? Sexual activity: Not on file  ?Other Topics Concern  ? Not on file  ?Social History Narrative  ? Married- 32 years  ? 2 children- grown daughter- lives in [place]- has 2 children  ? Grown son lives in [place]- 4 children  ? 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 Enjoys watching TV, sleeping  ? Completed some college  ? Left-handed.  ? 1-2 cups caffeine daily.  ? Lives at home with her husband.  ? ?Social Determinants of Health  ? ?Financial Resource Strain: Not on file  ?Food Insecurity: Not on file  ?Transportation Needs: Not on file  ?Physical Activity: Not on file  ?Stress: Not on file  ?Social Connections: Not on file  ?Intimate  Partner Violence: Not on file  ? ? ? ?BP 114/70   Pulse 72   Ht _0  (1.676 m)   Wt 225 lb (102.1 kg)   LMP 06/17/2007   SpO2 98%  BMI 36.32 kg/m?  ? ?Physical Exam: ? ?Well appearing NAD ?HEENT: Unremarkable ?Neck:  No JVD, no thyromegally ?Lymphatics:  No adenopathy ?Back:  No CVA tenderness ?Lungs:  Clear with no wheezes ?HEART:  Regular rate rhythm, no murmurs, no rubs, no clicks ?Abd:  soft, positive bowel sounds, no organomegally, no rebound, no guarding ?Ext:  2 plus pulses, no edema, no cyanosis, no clubbing ?Skin:  No rashes no nodules ?Neuro:  CN II through XII intact, motor grossly intact ? ?EKG - nsr with LBBB ? ?Assess/Plan:  ?Chronic systolic heart failure - her symptoms are class 2-3 and I have discussed the treatment options in detail. The risks/benefits/goals/expectations of Biv ICD insertion were reviewed and she wishes to proceed. ?Obesity - she is encouraged to lose weight. ? ?Briana Overlie Vicent Febles,MD ?

## 2021-09-06 NOTE — Patient Instructions (Addendum)
Medication Instructions:  ?Your physician recommends that you continue on your current medications as directed. Please refer to the Current Medication list given to you today. ? ?Labwork: ?You will get lab work today:  CBC and BMP ? ?Testing/Procedures: ?None ordered. ? ? ?Follow-Up: ? ?SEE INSTRUCTION LETTER ? ?Any Other Special Instructions Will Be Listed Below (If Applicable). ? ?If you need a refill on your cardiac medications before your next appointment, please call your pharmacy.  ? ?Cardioverter Defibrillator Implantation ?An implantable cardioverter defibrillator (ICD) is a device that identifies and corrects abnormal heart rhythms. Cardioverter defibrillator implantation is a surgery to place an ICD under the skin in the chest or abdomen. An ICD has a battery, a small computer (pulse generator), and wires (leads) that go into the heart. The ICD detects and corrects two types of dangerous irregular heart rhythms (arrhythmias): ?A rapid heart rhythm in the lower chambers of the heart (ventricles). This is called ventricular tachycardia. ?The ventricles contracting in an uncoordinated way. This is called ventricular fibrillation. ?There are different types of ICDs, and the electrical signals from the ICD can be programmed differently based on the condition being treated. The electrical signals from the ICD can be low-energy pulses, high-energy shocks, or a combination of the two. The low-energy pulses are generally used to restore the heartbeat to normal when it is either too slow (bradycardia) or too fast. These pulses are painless. The high-energy shocks are used to treat abnormal rhythms such as ventricular tachycardia or ventricular fibrillation. This shock may feel like a strong jolt in the chest. ?Your health care provider may recommend an ICD if you have: ?Had a ventricular arrhythmia in the past. ?A damaged heart because of a disease or heart condition. ?A weakened heart muscle from a heart attack or  cardiac arrest. ?A congenital heart defect. ?Long QT syndrome, which is a disorder of the heart's electrical system. ?Brugada syndrome, which is a condition that causes a disruption of the heart's normal rhythm. ?Tell a health care provider about: ?Any allergies you have. ?All medicines you are taking, including vitamins, herbs, eye drops, creams, and over-the-counter medicines. ?Any problems you or family members have had with anesthetic medicines. ?Any blood disorders you have. ?Any surgeries you have had. ?Any medical conditions you have. ?Whether you are pregnant or may be pregnant. ?What are the risks? ?Generally, this is a safe procedure. However, problems may occur, including: ?Infection. ?Bleeding. ?Allergic reactions to medicines used during the procedure. ?Blood clots. ?Swelling or bruising. ?Damage to nearby structures or organs, such as nerves, lungs, blood vessels, or the heart where the ICD leads or pulse generator is implanted. ?What happens before the procedure? ?Staying hydrated ?Follow instructions from your health care provider about hydration, which may include: ?Up to 2 hours before the procedure - you may continue to drink clear liquids, such as water, clear fruit juice, black coffee, and plain tea. ? ?Eating and drinking restrictions ?Follow instructions from your health care provider about eating and drinking, which may include: ?8 hours before the procedure - stop eating heavy meals or foods, such as meat, fried foods, or fatty foods. ?6 hours before the procedure - stop eating light meals or foods, such as toast or cereal. ?6 hours before the procedure - stop drinking milk or drinks that contain milk. ?2 hours before the procedure - stop drinking clear liquids. ?Medicines ?Ask your health care provider about: ?Changing or stopping your regular medicines. This is especially important if you are taking diabetes  medicines or blood thinners. ?Taking medicines such as aspirin and ibuprofen.  These medicines can thin your blood. Do not take these medicines unless your health care provider tells you to take them. ?Taking over-the-counter medicines, vitamins, herbs, and supplements. ?Tests ?You may have an exam or testing. These may include: ?Blood tests. ?A test to check the electrical signals in your heart (electrocardiogram, ECG). ?Imaging tests, such as a chest X-ray. ?Echocardiogram. This is an ultrasound of your heart to evaluate your heart structures and function. ?An event monitor or Holter monitor to wear at home. ?General instructions ?Do not use any products that contain nicotine or tobacco for at least 4 weeks before the procedure. These products include cigarettes, chewing tobacco, and vaping devices, such as e-cigarettes. If you need help quitting, ask your health care provider. ?Ask your health care provider: ?How your procedure site will be marked. ?What steps will be taken to help prevent infection. These may include: ?Removing hair at the surgery site. ?Washing skin with a germ-killing soap. ?Taking antibiotic medicine. ?You may be asked to shower with a germ-killing soap. ?Plan to have a responsible adult take you home from the hospital or clinic. ?What happens during the procedure? ? ?Small monitors will be put on your body. They will be used to check your heart rate, blood pressure, and oxygen level. ?A pair of sticky pads (defibrillator pads) may be placed on your back and chest. These pads are able to pace your heart as needed during the procedure. ?An IV will be inserted into one of your veins. ?You will be given one or more of the following: ?A medicine to help you relax (sedative). ?A medicine to numb the area (local anesthetic). ?A medicine to make you fall asleep(general anesthetic). ?A small incision will be made to create a deep pocket under the skin of your chest or abdomen. ?Leads will be guided through a blood vessel into your heart and attached to your heart muscles.  Depending on the ICD, the leads may go into one ventricle, or they may go into both ventricles and into an upper chamber of the heart. An X-ray machine (fluoroscope) will be used to help guide the leads. The other end of the leads will be attached to the pulse generator. ?The pulse generator will be placed into the pocket under the skin. ?The ICD will be tested, and your health care provider will program the ICD for the condition being treated. ?The incision will be closed with stitches (sutures), skin glue, adhesive strips, or staples. ?A bandage (dressing) will be placed over the incision. ?The procedure may vary among health care providers and hospitals. ?What happens after the procedure? ?Your blood pressure, heart rate, breathing rate, and blood oxygen level will be monitored until you leave the hospital or clinic. Your health care provider will also monitor your ICD to make sure it is working properly. ?A chest X-ray will be taken to check that the ICD is in the right place. ?Do not raise the arm on the side of your procedure higher than your shoulder for as long as told by your health care provider. This is usually at least 6 weeks. ?You may be given an identification card explaining that you have an ICD. ?You will be given a remote home monitoring device to use with your ICD to allow your device to communicate with your clinic. ?Summary ?An implantable cardioverter defibrillator (ICD) is a device that identifies and corrects abnormal heart rhythms. Cardioverter defibrillator implantation is a  surgery to place an ICD under the skin in the chest or abdomen. ?An ICD consists of a battery, a small computer (pulse generator), and wires (leads) that go into the heart. ?During the procedure, the ICD will be tested, and your health care provider will program the ICD for the condition being treated. ?After the procedure, a chest X-ray will be taken to check that the ICD is in the right place. ?This information is not  intended to replace advice given to you by your health care provider. Make sure you discuss any questions you have with your health care provider. ?Document Revised: 11/30/2019 Document Reviewed: 11/30/2019 ?

## 2021-09-09 ENCOUNTER — Telehealth (INDEPENDENT_AMBULATORY_CARE_PROVIDER_SITE_OTHER): Payer: Medicare Other | Admitting: Family

## 2021-09-09 DIAGNOSIS — L89212 Pressure ulcer of right hip, stage 2: Secondary | ICD-10-CM | POA: Diagnosis not present

## 2021-09-09 MED ORDER — DUODERM CGF DRESSING EX MISC: CUTANEOUS | 3 refills | Status: DC

## 2021-09-09 NOTE — Progress Notes (Signed)
? ? ?MyChart Video Visit ? ? ? ?Virtual Visit via Video Note  ? ?This visit type was conducted due to national recommendations for restrictions regarding the COVID-19 Pandemic (e.g. social distancing) in an effort to limit this patient's exposure and mitigate transmission in our community. This patient is at least at moderate risk for complications without adequate follow up. This format is felt to be most appropriate for this patient at this time. Physical exam was limited by quality of the video and audio technology used for the visit. CMA was able to get the patient set up on a video visit. ? ?Patient location: Home Patient and provider in visit ?Provider location: Office ? ?I discussed the limitations of evaluation and management by telemedicine and the availability of in person appointments. The patient expressed understanding and agreed to proceed. ? ?Visit Date: 09/09/2021 ? ?Today's healthcare provider: Nance Pear, NP  ? ? ? ?Subjective:  ? ? Patient ID: Briana Miller, female    DOB: 04/13/1956, 66 y.o.   MRN: [id number] ? ?Chief Complaint  ?Patient presents with  ? Wound Check  ?  Patient reports having a wound on right leg "as big as a dime"   ? ? ?Wound Check ? ?Patient is in today for a video visit.  ? ?Wound- She complains of a dime size circular wound on the back of her right thigh for the past 3 weeks. She is applying zinc cream, calazime cream, and A+D ointment to manage her wound. It is sore to touch. She thinks her wound developed due sitting on her wheelchair for long periods.  ? ?Defibrillator- She reports having an upcomming appointment to receive her defibrillator.  ? ? ?Past Medical History:  ?Diagnosis Date  ? Acute systolic congestive heart failure ([place]) 03/01/2014  ? Allergy   ? Anemia   ? Arthritis   ? Cardiomyopathy ([place])   ? Cervical myelopathy ([place])   ? CHF (congestive heart failure) ([place])   ? GERD (gastroesophageal reflux disease)   ? Gout   ? Heart disease   ?  Hyperlipidemia   ? Hypertension   ? Leg pain   ? Overactive bladder   ? ? ?Past Surgical History:  ?Procedure Laterality Date  ? ANTERIOR CERVICAL DECOMP/DISCECTOMY FUSION N/A 08/03/2016  ? Procedure: ANTERIOR CERVICAL DECOMPRESSION/DISCECTOMY FUSION C4-5, C5-6;  Surgeon: Kristeen Miss, MD;  Location: [facility];  Service: Neurosurgery;  Laterality: N/A;  ? COLONOSCOPY    ? COLONOSCOPY WITH PROPOFOL N/A 02/24/2019  ? Procedure: COLONOSCOPY WITH PROPOFOL;  Surgeon: Milus Banister, MD;  [facility];  Service: Endoscopy;  Laterality: N/A;  ? ESOPHAGOGASTRODUODENOSCOPY (EGD) WITH PROPOFOL N/A 04/25/2021  ? Procedure: ESOPHAGOGASTRODUODENOSCOPY (EGD) WITH PROPOFOL;  Surgeon: Milus Banister, MD;  [facility];  Service: Endoscopy;  Laterality: N/A;  ? KNEE ARTHROSCOPY    ? 2007  ? ROTATOR CUFF REPAIR Right 03/23/13  ? TOTAL KNEE ARTHROPLASTY  09/22/2011  ? Procedure: TOTAL KNEE ARTHROPLASTY;  Surgeon: Rudean Haskell, MD;  Location: [facility];  Service: Orthopedics;  Laterality: Right;  ? ? ?Family History  ?Problem Relation Age of Onset  ? Cancer Mother   ?     colon 57 and breast 13- deceased  ? Colon cancer Mother   ? Diabetes Father   ?     living  ? Asthma Father   ? Hypertension Father   ? Heart attack Father 63  ?     medical management per pt  ? Glaucoma  Father   ?     had eye transplant  ? Kidney failure Father   ?     acute renal failure- died 64  ? Esophageal cancer Neg Hx   ? Stomach cancer Neg Hx   ? Rectal cancer Neg Hx   ? ? ?Social History  ? ?Socioeconomic History  ? Marital status: Married  ?  Spouse name: Not on file  ? Number of children: 2  ? Years of education: HS  ? Highest education level: Not on file  ?Occupational History  ? Occupation: [employer]  ?  Employer: [employer]  ?Tobacco Use  ? Smoking status: Never  ? Smokeless tobacco: Never  ?Vaping Use  ? Vaping Use: Never used  ?Substance and Sexual Activity  ? Alcohol use: Not Currently  ?  Alcohol/week: 2.0 standard drinks  ?  Types:  2 Glasses of wine per week  ? Drug use: No  ? Sexual activity: Not on file  ?Other Topics Concern  ? Not on file  ?Social History Narrative  ? Married- 32 years  ? 2 children- grown daughter- lives in [place]- has 2 children  ? Grown son lives in [place]- 4 children  ? 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 Enjoys watching TV, sleeping  ? Completed some college  ? Left-handed.  ? 1-2 cups caffeine daily.  ? Lives at home with her husband.  ? ?Social Determinants of Health  ? ?Financial Resource Strain: Not on file  ?Food Insecurity: Not on file  ?Transportation Needs: Not on file  ?Physical Activity: Not on file  ?Stress: Not on file  ?Social Connections: Not on file  ?Intimate Partner Violence: Not on file  ? ? ?Outpatient Medications Prior to Visit  ?Medication Sig Dispense Refill  ? allopurinol (ZYLOPRIM) 100 MG tablet TAKE 1 TABLET BY MOUTH  DAILY (Patient taking differently: Take 100 mg by mouth as needed.) 90 tablet 3  ? blood glucose meter kit and supplies KIT Dispense based on patient and insurance preference. Use up to four times daily as directed. 1 each 0  ? carvedilol (COREG) 12.5 MG tablet TAKE 1 TABLET BY MOUTH  TWICE DAILY 180 tablet 4  ? celecoxib (CELEBREX) 100 MG capsule Take 100 mg by mouth daily as needed for pain.    ? cholecalciferol (VITAMIN D3) 25 MCG (1000 UNIT) tablet Take 2,000 Units by mouth at bedtime.    ? empagliflozin (JARDIANCE) 10 MG TABS tablet Take 1 tablet (10 mg total) by mouth daily before breakfast. 30 tablet 3  ? famotidine (PEPCID) 40 MG tablet TAKE 1 TABLET BY MOUTH  TWICE DAILY 180 tablet 3  ? fluconazole (DIFLUCAN) 150 MG tablet Take 1 tablet (150 mg total) by mouth daily. 1 tablet 0  ? fluticasone (FLONASE) 50 MCG/ACT nasal spray Place 2 sprays into both nostrils daily. 16 g 1  ? gabapentin (NEURONTIN) 300 MG capsule TAKE 3 CAPSULES BY MOUTH 3  TIMES DAILY 810 capsule 3  ? latanoprost (XALATAN) 0.005 % ophthalmic solution Place 1 drop  into both eyes at bedtime.   11  ? losartan (COZAAR) 25 MG tablet Take 1 tablet (25 mg total) by mouth daily. 90 tablet 3  ? nystatin (MYCOSTATIN/NYSTOP) powder Apply 1 application. topically 3 (three) times daily as needed (rash). 60 g 1  ? ondansetron (ZOFRAN) 4 MG tablet TAKE 1 TABLET BY MOUTH EVERY 4  TO 6 HOURS AS NEEDED FOR NAUSEA 90 tablet  0  ? pantoprazole (PROTONIX) 40 MG tablet Take 1 tablet (40 mg total) by mouth 2 (two) times daily. 180 tablet 3  ? potassium chloride SA (KLOR-CON) 20 MEQ tablet TAKE 1 TABLET BY MOUTH  DAILY (Patient taking differently: Take 20 mEq by mouth at bedtime.) 90 tablet 1  ? Semaglutide,0.25 or 0.5MG/DOS, (OZEMPIC, 0.25 OR 0.5 MG/DOSE,) 2 MG/1.5ML SOPN Inject 0.25 mg into the skin once a week. 3 mL 1  ? spironolactone (ALDACTONE) 25 MG tablet TAKE 1 TABLET BY MOUTH  DAILY 90 tablet 3  ? Tart Cherry 1200 MG CAPS Take 1,200 mg by mouth at bedtime.    ? timolol (BETIMOL) 0.5 % ophthalmic solution Place 1 drop into both eyes every morning.     ? torsemide (DEMADEX) 20 MG tablet TAKE 2 TABLETS BY MOUTH  ALTERNATING WITH 1 TABLET  EVERY OTHER DAY 135 tablet 3  ? ?No facility-administered medications prior to visit.  ? ? ?Allergies  ?Allergen Reactions  ? Entresto [Sacubitril-Valsartan] Other (See Comments)  ?  "Dry mouth"  ? ? ?Review of Systems  ?Skin:   ?     (+)circular wound on back or right thigh. Size of dime. Tender to touch  ? ?   ?Objective:  ?  ?Physical Exam ?Constitutional:   ?   Appearance: Normal appearance.  ?HENT:  ?   Head: Normocephalic.  ?Pulmonary:  ?   Effort: Pulmonary effort is normal.  ?Skin: ?   General: Skin is dry.  ?   Comments: Round ulcer on right posterior thigh with clean base, approximately size of dime.  ?Neurological:  ?   Mental Status: She is alert and oriented to person, place, and time.  ?Psychiatric:     ?   Mood and Affect: Mood normal.     ?   Behavior: Behavior normal.     ?   Thought Content: Thought content normal.     ?   Judgment:  Judgment normal.  ? ? ?LMP 06/17/2007  ?Wt Readings from Last 3 Encounters:  ?09/06/21 225 lb (102.1 kg)  ?08/02/21 220 lb (99.8 kg)  ?04/25/21 220 lb (99.8 kg)  ? ? ?Diabetic Foot Exam - Simple   ?No data filed ?

## 2021-09-09 NOTE — Assessment & Plan Note (Signed)
New. Related to pressure from her wheelchair. Recommended that she apply duoderm dressing every 3 days to affected area.  Recommended frequent position changes/seating changes as well to decrease the pressure.  Pt is advised on signs and symptoms of infection and advised to let us know if this occurs.  ?

## 2021-09-13 NOTE — Pre-Procedure Instructions (Signed)
Attempted to call patient regarding procedure instructions.  Left voice mail on the following items: Arrival time 0730 Nothing to eat or drink after midnight No meds AM of procedure Responsible person to drive you home and stay with you for 24 hrs Wash with special soap night before and morning of procedure  

## 2021-09-16 ENCOUNTER — Encounter (HOSPITAL_COMMUNITY)
Admission: RE | Disposition: A | Discharge status: Home / Self Care | Payer: Medicare Other | Source: Home / Self Care | Attending: Internal Medicine

## 2021-09-16 ENCOUNTER — Ambulatory Visit (HOSPITAL_COMMUNITY)
Admission: RE | Admit: 2021-09-16 | Discharge: 2021-09-16 | Disposition: A | Discharge status: Home / Self Care | Payer: Medicare Other | Attending: Internal Medicine | Admitting: Internal Medicine

## 2021-09-16 ENCOUNTER — Ambulatory Visit (HOSPITAL_COMMUNITY): Payer: Medicare Other

## 2021-09-16 ENCOUNTER — Other Ambulatory Visit: Payer: Self-pay

## 2021-09-16 DIAGNOSIS — E669 Obesity, unspecified: Secondary | ICD-10-CM | POA: Insufficient documentation

## 2021-09-16 DIAGNOSIS — Z7985 Long-term (current) use of injectable non-insulin antidiabetic drugs: Secondary | ICD-10-CM | POA: Insufficient documentation

## 2021-09-16 DIAGNOSIS — I110 Hypertensive heart disease with heart failure: Secondary | ICD-10-CM | POA: Insufficient documentation

## 2021-09-16 DIAGNOSIS — M109 Gout, unspecified: Secondary | ICD-10-CM | POA: Diagnosis not present

## 2021-09-16 DIAGNOSIS — I428 Other cardiomyopathies: Secondary | ICD-10-CM | POA: Insufficient documentation

## 2021-09-16 DIAGNOSIS — J811 Chronic pulmonary edema: Secondary | ICD-10-CM | POA: Diagnosis not present

## 2021-09-16 DIAGNOSIS — Z79899 Other long term (current) drug therapy: Secondary | ICD-10-CM | POA: Insufficient documentation

## 2021-09-16 DIAGNOSIS — K219 Gastro-esophageal reflux disease without esophagitis: Secondary | ICD-10-CM | POA: Insufficient documentation

## 2021-09-16 DIAGNOSIS — E785 Hyperlipidemia, unspecified: Secondary | ICD-10-CM | POA: Diagnosis not present

## 2021-09-16 DIAGNOSIS — Z7984 Long term (current) use of oral hypoglycemic drugs: Secondary | ICD-10-CM | POA: Insufficient documentation

## 2021-09-16 DIAGNOSIS — Z6836 Body mass index (BMI) 36.0-36.9, adult: Secondary | ICD-10-CM | POA: Insufficient documentation

## 2021-09-16 DIAGNOSIS — I517 Cardiomegaly: Secondary | ICD-10-CM | POA: Diagnosis not present

## 2021-09-16 DIAGNOSIS — I447 Left bundle-branch block, unspecified: Secondary | ICD-10-CM | POA: Insufficient documentation

## 2021-09-16 DIAGNOSIS — I5022 Chronic systolic (congestive) heart failure: Secondary | ICD-10-CM | POA: Insufficient documentation

## 2021-09-16 HISTORY — PX: BIV ICD INSERTION CRT-D: EP1195

## 2021-09-16 LAB — GLUCOSE, CAPILLARY
Glucose-Capillary: 93 mg/dL (ref 70–99)
Glucose-Capillary: 84 mg/dL (ref 70–99)

## 2021-09-16 SURGERY — BIV ICD INSERTION CRT-D

## 2021-09-16 MED ORDER — MIDAZOLAM HCL 5 MG/5ML IJ SOLN
INTRAMUSCULAR | Status: AC
  Filled 2021-09-16: qty 5

## 2021-09-16 MED ORDER — SODIUM CHLORIDE 0.9 % IV SOLN
80.0000 mg | INTRAVENOUS | Status: AC
  Administered 2021-09-16: 80 mg

## 2021-09-16 MED ORDER — CEFAZOLIN SODIUM-DEXTROSE 2-4 GM/100ML-% IV SOLN
INTRAVENOUS | Status: AC
  Filled 2021-09-16: qty 100

## 2021-09-16 MED ORDER — POVIDONE-IODINE 10 % EX SWAB
2.0000 | Freq: Once | CUTANEOUS | Status: AC
Start: 2021-09-16 — End: 2021-09-16
  Administered 2021-09-16: 2 via TOPICAL

## 2021-09-16 MED ORDER — ACETAMINOPHEN 325 MG PO TABS
325.0000 mg | ORAL_TABLET | ORAL | Status: DC | PRN
  Administered 2021-09-16 (×2): 650 mg via ORAL
  Filled 2021-09-16 (×2): qty 2

## 2021-09-16 MED ORDER — LIDOCAINE HCL (PF) 1 % IJ SOLN
INTRAMUSCULAR | Status: AC
  Filled 2021-09-16: qty 60

## 2021-09-16 MED ORDER — CEFAZOLIN SODIUM-DEXTROSE 1-4 GM/50ML-% IV SOLN
1.0000 g | Freq: Once | INTRAVENOUS | Status: AC
  Administered 2021-09-16: 1 g via INTRAVENOUS
  Filled 2021-09-16: qty 50

## 2021-09-16 MED ORDER — SODIUM CHLORIDE 0.9 % IV SOLN: INTRAVENOUS | Status: DC

## 2021-09-16 MED ORDER — FENTANYL CITRATE (PF) 100 MCG/2ML IJ SOLN
INTRAMUSCULAR | Status: DC | PRN
  Administered 2021-09-16 (×6): 12.5 ug via INTRAVENOUS

## 2021-09-16 MED ORDER — HEPARIN (PORCINE) IN NACL 1000-0.9 UT/500ML-% IV SOLN
INTRAVENOUS | Status: DC | PRN
  Administered 2021-09-16: 500 mL

## 2021-09-16 MED ORDER — FENTANYL CITRATE (PF) 100 MCG/2ML IJ SOLN
INTRAMUSCULAR | Status: AC
  Filled 2021-09-16: qty 2

## 2021-09-16 MED ORDER — CHLORHEXIDINE GLUCONATE 4 % EX LIQD: 4.0000 "application " | Freq: Once | CUTANEOUS | Status: DC

## 2021-09-16 MED ORDER — CEFAZOLIN SODIUM-DEXTROSE 2-4 GM/100ML-% IV SOLN
2.0000 g | INTRAVENOUS | Status: AC
  Administered 2021-09-16: 2 g via INTRAVENOUS

## 2021-09-16 MED ORDER — MIDAZOLAM HCL 5 MG/5ML IJ SOLN
INTRAMUSCULAR | Status: DC | PRN
  Administered 2021-09-16 (×6): 1 mg via INTRAVENOUS

## 2021-09-16 MED ORDER — ONDANSETRON HCL 4 MG/2ML IJ SOLN: 4.0000 mg | Freq: Four times a day (QID) | INTRAMUSCULAR | Status: DC | PRN

## 2021-09-16 MED ORDER — LIDOCAINE HCL (PF) 1 % IJ SOLN
INTRAMUSCULAR | Status: AC
  Filled 2021-09-16: qty 30

## 2021-09-16 MED ORDER — HEPARIN (PORCINE) IN NACL 1000-0.9 UT/500ML-% IV SOLN
INTRAVENOUS | Status: AC
  Filled 2021-09-16: qty 500

## 2021-09-16 MED ORDER — IOHEXOL 350 MG/ML SOLN
INTRAVENOUS | Status: DC | PRN
  Administered 2021-09-16: 30 mL

## 2021-09-16 MED ORDER — SODIUM CHLORIDE 0.9 % IV SOLN
INTRAVENOUS | Status: AC
  Filled 2021-09-16: qty 2

## 2021-09-16 MED ORDER — LIDOCAINE HCL (PF) 1 % IJ SOLN
INTRAMUSCULAR | Status: DC | PRN
  Administered 2021-09-16: 40 mL

## 2021-09-16 SURGICAL SUPPLY — 18 items
ASSURA CRTD CD3369-40C (ICD Generator) ×2 IMPLANT
CABLE ADAPT PACING TEMP 12FT (ADAPTER) ×1 IMPLANT
CABLE SURGICAL S-101-97-12 (CABLE) ×2 IMPLANT
CATH CPS DIRECT 135 DS2C020 (CATHETERS) ×1 IMPLANT
CATH HEX JOSEPH 2-5-2 65CM 6F (CATHETERS) ×1 IMPLANT
CPS IMPLANT KIT 410190 (MISCELLANEOUS) ×1 IMPLANT
DEFIB ASSURA CRT-D (ICD Generator) IMPLANT
LEAD DURATA 7122-65CM (Lead) ×1 IMPLANT
LEAD QUARTET 1458Q-86CM (Lead) ×1 IMPLANT
LEAD TENDRIL MRI 52CM LPA1200M (Lead) ×1 IMPLANT
PAD DEFIB RADIO PHYSIO CONN (PAD) ×2 IMPLANT
SHEATH 7FR PRELUDE SNAP 13 (SHEATH) ×1 IMPLANT
SHEATH 8FR PRELUDE SNAP 13 (SHEATH) ×1 IMPLANT
SHEATH WORLEY 9FR 62CM (SHEATH) ×1 IMPLANT
SLITTER UNIVERSAL DS2A003 (MISCELLANEOUS) ×1 IMPLANT
TRAY PACEMAKER INSERTION (PACKS) ×2 IMPLANT
WIRE ACUITY WHISPER EDS 4648 (WIRE) ×2 IMPLANT
WIRE HI TORQ VERSACORE-J 145CM (WIRE) ×1 IMPLANT

## 2021-09-16 NOTE — Discharge Instructions (Addendum)
After Your ICD ?(Implantable Cardiac Defibrillator) ? ? ?You have a [employer] ICD ? ?ACTIVITY ?Do not lift your arm above shoulder height for 1 week after your procedure. After 7 days, you may progress as below.  ?You should remove your sling 24 hours after your procedure, unless otherwise instructed by your provider.  ? ? ? Monday September 23, 2021  Tuesday September 24, 2021 Wednesday September 25, 2021 Thursday September 26, 2021  ? ?Do not lift, push, pull, or carry anything over 10 pounds with the affected arm until 6 weeks (Monday Oct 28, 2021 ) after your procedure.  ? ?You may drive AFTER your wound check, unless you have been told otherwise by your provider.  ? ?Ask your healthcare provider when you can go back to work ? ? ?INCISION/Dressing ?If you are on a blood thinner such as Coumadin, Xarelto, Eliquis, Plavix, or Pradaxa please confirm with your provider when this should be resumed.  ? ?If large square, outer bandage is left in place, this can be removed after 24 hours from your procedure. Do not remove steri-strips or glue as below.  ? ?Monitor your defibrillator site for redness, swelling, and drainage. Call the device clinic at [phone] if you experience these symptoms or fever/chills. ? ?If your incision is sealed with Steri-strips or staples, you may shower 10 days after your procedure or when told by your provider. Do not remove the steri-strips or let the shower hit directly on your site. You may wash around your site with soap and water.   ? ?If you were discharged in a sling, please do not wear this during the day more than 48 hours after your surgery unless otherwise instructed. This may increase the risk of stiffness and soreness in your shoulder.  ? ?Avoid lotions, ointments, or perfumes over your incision until it is well-healed. ? ?You may use a hot tub or a pool AFTER your wound check appointment if the incision is completely closed. ? ?Your ICD is designed to protect you from life threatening  heart rhythms. Because of this, you may receive a shock.  ? ?1 shock with no symptoms:  Call the office during business hours. ?1 shock with symptoms (chest pain, chest pressure, dizziness, lightheadedness, shortness of breath, overall feeling unwell):  Call 911. ?If you experience 2 or more shocks in 24 hours:  Call 911. ?If you receive a shock, you should not drive for 6 months per the [place] DMV IF you receive appropriate therapy from your ICD.  ? ?ICD Alerts:  Some alerts are vibratory and others beep. These are NOT emergencies. Please call our office to let us know. If this occurs at night or on weekends, it can wait until the next business day. Send a remote transmission. ? ?If your device is capable of reading fluid status (for heart failure), you will be offered monthly monitoring to review this with you.  ? ?DEVICE MANAGEMENT ?Remote monitoring is used to monitor your ICD from home. This monitoring is scheduled every 91 days by our office. It allows [place] to keep an eye on the functioning of your device to ensure it is working properly. You will routinely see your Electrophysiologist annually (more often if necessary).  ? ?You should receive your ID card for your new device in 4-8 weeks. Keep this card with you at all times once received. Consider wearing a medical alert bracelet or necklace. ? ?Your ICD  may be MRI compatible. This will be discussed at your next  office visit/wound check.  You should avoid contact with strong electric or magnetic fields.  ? ?Do not use amateur (ham) radio equipment or electric (arc) welding torches. MP3 player headphones with magnets should not be used. Some devices are safe to use if held at least 12 inches (30 cm) from your defibrillator. These include power tools, lawn mowers, and speakers. If you are unsure if something is safe to use, ask your health care provider. ? ?When using your cell phone, hold it to the ear that is on the opposite side from the defibrillator. Do not  leave your cell phone in a pocket over the defibrillator. ? ?You may safely use electric blankets, heating pads, computers, and microwave ovens. ? ?Call the office right away if: ?You have chest pain. ?You feel more than one shock. ?You feel more short of breath than you have felt before. ?You feel more light-headed than you have felt before. ?Your incision starts to open up. ? ?This information is not intended to replace advice given to you by your health care provider. Make sure you discuss any questions you have with your health care provider. ?

## 2021-09-16 NOTE — Interval H&P Note (Signed)
History and Physical Interval Note: ? ?09/16/2021 ?9:05 AM ? ?[name]  has presented today for surgery, with the diagnosis of cardiomyopathy, left bundle branch block.  The various methods of treatment have been discussed with the patient and family. After consideration of risks, benefits and other options for treatment, the patient has consented to  Procedure(s): ?BIV ICD INSERTION CRT-D (N/A) as a surgical intervention.  The patient's history has been reviewed, patient examined, no change in status, stable for surgery.  I have reviewed the patient's chart and labs.  Questions were answered to the patient's satisfaction.   ? ? ?Briana Miller ? ? ?

## 2021-09-17 ENCOUNTER — Ambulatory Visit: Payer: Medicare Other | Admitting: General Practice

## 2021-09-17 ENCOUNTER — Telehealth: Payer: Self-pay

## 2021-09-17 ENCOUNTER — Encounter (HOSPITAL_COMMUNITY): Payer: Self-pay | Admitting: Internal Medicine

## 2021-09-17 NOTE — Telephone Encounter (Signed)
-----   Message from Graciella Freer, PA-C sent at 09/16/2021  4:02 PM EDT ----- ?Same day CRT D GT 4/3 ? ?

## 2021-09-17 NOTE — Telephone Encounter (Signed)
Spoke with patient's family member and patient on speaker phone informed them of lifting restrictions/ wound care patient had already removed large clear bandage informed patient and family to leave steri-strips in place until wound visit scheduled for 09/27/21 and to try and not get steri-strips wet until after wound check, patient and patients family member voiced understanding. ?

## 2021-09-20 ENCOUNTER — Ambulatory Visit: Payer: Medicare Other | Admitting: Adult Health

## 2021-09-20 ENCOUNTER — Encounter: Payer: Self-pay | Admitting: Family

## 2021-09-23 MED ORDER — OZEMPIC (0.25 OR 0.5 MG/DOSE) 2 MG/1.5ML ~~LOC~~ SOPN: 0.2500 mg | PEN_INJECTOR | SUBCUTANEOUS | 1 refills | Status: DC

## 2021-09-24 ENCOUNTER — Encounter: Payer: Self-pay | Admitting: Family

## 2021-09-26 ENCOUNTER — Ambulatory Visit: Payer: Medicare Other

## 2021-09-27 ENCOUNTER — Ambulatory Visit (INDEPENDENT_AMBULATORY_CARE_PROVIDER_SITE_OTHER): Payer: Medicare Other

## 2021-09-27 DIAGNOSIS — I428 Other cardiomyopathies: Secondary | ICD-10-CM

## 2021-09-27 DIAGNOSIS — I447 Left bundle-branch block, unspecified: Secondary | ICD-10-CM

## 2021-09-27 DIAGNOSIS — I5042 Chronic combined systolic (congestive) and diastolic (congestive) heart failure: Secondary | ICD-10-CM

## 2021-09-27 LAB — CUP PACEART INCLINIC DEVICE CHECK
Battery Remaining Longevity: 48 mo
Brady Statistic RA Percent Paced: 0.16 %
Brady Statistic RV Percent Paced: 98 %
Date Time Interrogation Session: 20230414124107
HighPow Impedance: 54 Ohm
Implantable Lead Implant Date: 20230403
Implantable Lead Implant Date: 20230403
Implantable Lead Implant Date: 20230403
Implantable Lead Location: 753858
Implantable Lead Location: 753859
Implantable Lead Location: 753860
Implantable Pulse Generator Implant Date: 20230403
Lead Channel Impedance Value: 375 Ohm
Lead Channel Impedance Value: 425 Ohm
Lead Channel Impedance Value: 637.5 Ohm
Lead Channel Pacing Threshold Amplitude: 0.75 V
Lead Channel Pacing Threshold Amplitude: 0.75 V
Lead Channel Pacing Threshold Amplitude: 1 V
Lead Channel Pacing Threshold Amplitude: 1 V
Lead Channel Pacing Threshold Amplitude: 1.25 V
Lead Channel Pacing Threshold Amplitude: 1.25 V
Lead Channel Pacing Threshold Pulse Width: 0.5 ms
Lead Channel Pacing Threshold Pulse Width: 0.5 ms
Lead Channel Pacing Threshold Pulse Width: 0.5 ms
Lead Channel Pacing Threshold Pulse Width: 0.5 ms
Lead Channel Pacing Threshold Pulse Width: 0.5 ms
Lead Channel Pacing Threshold Pulse Width: 0.5 ms
Lead Channel Sensing Intrinsic Amplitude: 2.1 mV
Lead Channel Sensing Intrinsic Amplitude: 7.1 mV
Lead Channel Setting Pacing Amplitude: 3.5 V
Lead Channel Setting Pacing Amplitude: 3.5 V
Lead Channel Setting Pacing Amplitude: 3.5 V
Lead Channel Setting Pacing Pulse Width: 0.5 ms
Lead Channel Setting Pacing Pulse Width: 0.5 ms
Lead Channel Setting Sensing Sensitivity: 0.5 mV
Pulse Gen Serial Number: 9986369

## 2021-09-27 NOTE — Patient Instructions (Signed)

## 2021-09-27 NOTE — Progress Notes (Signed)
Wound check appointment. Steri-strips removed. Wound without redness or edema. Incision edges approximated, wound well healed. Normal device function. Thresholds, sensing, and impedances consistent with implant measurements. Device programmed at 3.5V for extra safety margin until 3 month visit. Histogram distribution appropriate for patient and level of activity. No mode switches or ventricular arrhythmias noted. Patient educated about wound care, arm mobility, lifting restrictions, shock plan. ROV with GT 12/24/2021 ?

## 2021-09-30 ENCOUNTER — Telehealth: Payer: Self-pay | Admitting: *Deleted

## 2021-09-30 DIAGNOSIS — R601 Generalized edema: Secondary | ICD-10-CM

## 2021-09-30 DIAGNOSIS — I5042 Chronic combined systolic (congestive) and diastolic (congestive) heart failure: Secondary | ICD-10-CM

## 2021-09-30 MED ORDER — TORSEMIDE 20 MG PO TABS: 20.0000 mg | ORAL_TABLET | Freq: Every day | ORAL | 3 refills | Status: DC

## 2021-09-30 NOTE — Telephone Encounter (Signed)
-----   Message from Lewayne Bunting, MD sent at 09/27/2021  3:15 PM EDT ----- ?Regarding: FW: Torsemide ?Can you review?  ?Thx ?----- Message ----- ?From: Dorathy Daft, RN ?Sent: 09/27/2021  12:54 PM EDT ?To: Freddi Starr, RN, Lewayne Bunting, MD ?Subject: Torsemide                                     ? ?I saw this patient for her wound-check s/p CRTD she is taking her Torsemide all kind of wonky way.... I think she is only taking a half a tab twice a day can someone call her and talk to her...  ? ?

## 2021-09-30 NOTE — Telephone Encounter (Signed)
Spoke with pt, she reports when she takes 2 torsemide she feels funny so she has been taking 20 mg once daily. She reports her weight is stable, she has no swelling or SOB. She understands to call if she were to have increased weight, swelling or SOB. ?

## 2021-10-03 ENCOUNTER — Other Ambulatory Visit: Payer: Self-pay | Admitting: Physician Assistant

## 2021-10-04 DIAGNOSIS — M542 Cervicalgia: Secondary | ICD-10-CM | POA: Diagnosis not present

## 2021-10-08 ENCOUNTER — Other Ambulatory Visit: Payer: Self-pay | Admitting: Family

## 2021-10-16 NOTE — Progress Notes (Signed)
?HPI- ?F never smoker referred last year for evaluation of cough and suspected OSA. Complicated by Migraine, HTN, CHF/ dilated CM, Allergic Rhinitis, GERD, Cervical Myelopathy, Osteoarthritis, Morbid Obesity, Chronic Pain, Borderline Diabetes, Glaucoma, ?Disability (Hasn't stood up since 2017),  ? PFT and Sleep study were ordered but not done. ? ?===================================================================== ? ? ?04/19/21- 65 yoF never smoker followed for evaluation of Cough and suspected OSA. Complicated by Migraine, HTN, CHF/ dilated CM, Allergic Rhinitis, GERD, Cervical Myelopathy, Osteoarthritis, Morbid Obesity, Chronic Pain, Borderline Diabetes, Glaucoma, ?Disability (Hasn't stood up since 2017),  ? PFT and Sleep study were ordered but not done. ?Covid vax- 3 Phizer ?Flu - had ?Sampled Breztri ?Pending EGD by Dr Christella Hartigan for GERD ?-----Patient reports that she has a lot of thick white sputum coming up,  ?Describes mostly morning cough.  Like to Trelegy better than Breztri, which she said made her feel "weird".  Trelegy helps her breathe better and cough less. ? ?10/17/21-  65 yoF never smoker followed for evaluation of Cough and suspected OSA. Complicated by Migraine, HTN, CHF/ dilated CM/ BIVICD,  Allergic Rhinitis, GERD, Cervical Myelopathy, Osteoarthritis, Morbid Obesity, Chronic Pain, Borderline Diabetes, Glaucoma, ?Disability (Hasn't stood up since 2017),  ?- Albuterol hfa,   note timolol ? PFT and Sleep study were never done. ?Covid vax- 3 Phizer ?Flu - had ?Breztri too expensive.  She remembers having an albuterol inhaler but does not remember effect.  Persistent morning cough productive of white mucus.  Discussed possibility this was related to the pulmonary vascular congestion on CXR. ?I think she is too heavy for [place] to fit in our PFT system but we discussed going ahead with home sleep test. ?CXR 09/16/21- ?IMPRESSION: ?1. New ICD in place. ?2. Cardiomegaly with central pulmonary vascular  congestion. ? ? ?ROS-see HPI   + = positive ?Constitutional:    weight loss, night sweats, fevers, chills,+ fatigue, lassitude. ?HEENT:    headaches, difficulty swallowing, tooth/dental problems, sore throat,  ?     sneezing, itching, ear ache, nasal congestion, post nasal drip, snoring ?CV:    chest pain, orthopnea, PND, swelling in lower extremities, anasarca,                                   ?dizziness, palpitations ?Resp:   shortness of breath with exertion or at rest.   ?             +productive cough,   non-productive cough, coughing up of blood.   ?           change in color of mucus.  wheezing.   ?Skin:    rash or lesions. ?GI:  No-   heartburn, indigestion, abdominal pain, nausea, vomiting, diarrhea,  ?               change in bowel habits, loss of appetite ?GU: dysuria, change in color of urine, no urgency or frequency.   flank pain. ?MS:  + joint pain, stiffness, decreased range of motion, back pain. ?Neuro-     nothing unusual ?Psych:  change in mood or affect.  depression or anxiety.   memory loss. ? ?OBJ- Physical Exam ?General- Alert, Oriented, Affect-appropriate, Distress- none acute, +Morbidly Obese, +power wheelchair  +Looks heavier than her stated weight of 225 lbs. ?Skin- rash-none, lesions- none, excoriation- none ?Lymphadenopathy- none ?Head- atraumatic ?           Eyes- Gross vision intact, PERRLA,  conjunctivae and secretions clear ?           Ears- Hearing, canals-normal ?           Nose- Clear, no-Septal dev, mucus, polyps, erosion, perforation  ?           Throat- Mallampati III , mucosa clear , drainage- none, tonsils+ present ?Neck- flexible , trachea midline, no stridor , thyroid nl, carotid no bruit ?Chest - symmetrical excursion , unlabored ?          Heart/CV- RRR , no murmur , no gallop  , no rub, nl s1 s2 ?                          - JVD- none , edema heavy legs stasis changes- none, varices- none ?          Lung-+ few rhonchi in bases, unlabored, wheeze- none, cough- none ,  dullness-none, rub- none ?          Chest wall-  ?Abd-  ?Br/ Gen/ Rectal- Not done, not indicated ?Extrem- +elastic hose ?Neuro- grossly intact to observation ? ? ?

## 2021-10-17 ENCOUNTER — Ambulatory Visit: Payer: Medicare Other | Admitting: Internal Medicine

## 2021-10-17 ENCOUNTER — Encounter: Payer: Self-pay | Admitting: Internal Medicine

## 2021-10-17 VITALS — BP 122/80 | HR 80 | Temp 97.9°F | Ht 66.0 in | Wt 225.0 lb

## 2021-10-17 DIAGNOSIS — G4733 Obstructive sleep apnea (adult) (pediatric): Secondary | ICD-10-CM

## 2021-10-17 DIAGNOSIS — R053 Chronic cough: Secondary | ICD-10-CM

## 2021-10-17 MED ORDER — ALBUTEROL SULFATE HFA 108 (90 BASE) MCG/ACT IN AERS: 2.0000 | INHALATION_SPRAY | Freq: Four times a day (QID) | RESPIRATORY_TRACT | 4 refills | Status: DC | PRN

## 2021-10-17 NOTE — Patient Instructions (Signed)
Script for albuterol inhaler sent to [facility] ? ?Order- schedule home sleep test   dx OSA ? Please call us for results of your sleep test about 2 weeks after the test. ? ?Your lungs looked wet on your last chest xray and that may be why you are coughing. You can discuss this with your cardiologist and primary care provider.  ?

## 2021-10-17 NOTE — Assessment & Plan Note (Signed)
We discussed and are ordering home sleep test.  She might be a candidate for CPAP. ?

## 2021-10-17 NOTE — Assessment & Plan Note (Signed)
I do not think we can do a PFT on her because of her body habitus and limited mobility.  Home sleep test will indicate nocturnal oxygenation.  Cough and exam would be consistent with chronic mild pulmonary edema as demonstrated on recent chest x-ray.  Her timolol might increase bronchospasm.  We will let her try working with an albuterol inhaler to see what difference that makes. ?

## 2021-10-21 ENCOUNTER — Other Ambulatory Visit: Payer: Self-pay | Admitting: *Deleted

## 2021-10-21 MED ORDER — PROAIR RESPICLICK 108 (90 BASE) MCG/ACT IN AEPB: 2.0000 | INHALATION_SPRAY | Freq: Four times a day (QID) | RESPIRATORY_TRACT | 3 refills | Status: DC | PRN

## 2021-10-22 ENCOUNTER — Other Ambulatory Visit: Payer: Self-pay | Admitting: Family

## 2021-10-23 ENCOUNTER — Other Ambulatory Visit: Payer: Self-pay | Admitting: Family

## 2021-10-24 ENCOUNTER — Other Ambulatory Visit: Payer: Self-pay

## 2021-10-24 ENCOUNTER — Telehealth: Payer: Self-pay | Admitting: Internal Medicine

## 2021-10-24 MED ORDER — ALBUTEROL SULFATE HFA 108 (90 BASE) MCG/ACT IN AERS: 2.0000 | INHALATION_SPRAY | Freq: Four times a day (QID) | RESPIRATORY_TRACT | 6 refills | Status: DC | PRN

## 2021-10-24 NOTE — Telephone Encounter (Signed)
Called [facility] about pt's rescue inhaler Rx. Spoke with Evelena Leyden who stated ventolin was not covered by [employer] but proair was. I stated to her that we have been told that proair was discontinued. Evelena Leyden stated that she felt like they still had proair and was going to run an Rx while on the phone to see if the Rx would go through for proair. Per Evelena Leyden, she was able to get the proair Rx to run through and she said that they still had some proair in stock and would get this sent out for pt. Nothing further needed. ?

## 2021-10-25 ENCOUNTER — Ambulatory Visit: Payer: Medicare Other | Admitting: Adult Health

## 2021-10-31 ENCOUNTER — Encounter: Payer: Self-pay | Admitting: Internal Medicine

## 2021-11-07 ENCOUNTER — Telehealth: Payer: Self-pay | Admitting: Family

## 2021-11-07 NOTE — Telephone Encounter (Signed)
Left message for patient to call back and schedule Medicare Annual Wellness Visit (AWV).   Please offer to do virtually or by telephone.  Left office number and my jabber #[phone].  AWVI eligible as of 06/17/2019  Please schedule at anytime with Nurse Health Advisor.   

## 2021-11-20 ENCOUNTER — Ambulatory Visit: Payer: Medicare Other | Admitting: Family

## 2021-11-27 ENCOUNTER — Ambulatory Visit: Payer: Medicare Other | Admitting: Family

## 2021-12-01 ENCOUNTER — Other Ambulatory Visit: Payer: Self-pay | Admitting: Family

## 2021-12-01 ENCOUNTER — Other Ambulatory Visit: Payer: Self-pay | Admitting: Neurology

## 2021-12-03 ENCOUNTER — Telehealth: Payer: Self-pay | Admitting: Family

## 2021-12-03 NOTE — Telephone Encounter (Signed)
Patient's daughter (not on DPR / Latoya Burtis) called stating her mom got a call yesterday asking her what the FMLA paperwork we received was for. Patient's daughter stated the paperwork was for her since she is her mom's caregiver. She would like a call back to discuss the forms. She was informed since she is not on the DPR, information would not be given and she stated she understood and would talk to the pt about it to get added but would still like a call back. Please advise.

## 2021-12-03 NOTE — Telephone Encounter (Signed)
Patient's daughter aware forms will be completed after ov, she will be at ov.

## 2021-12-11 ENCOUNTER — Ambulatory Visit (INDEPENDENT_AMBULATORY_CARE_PROVIDER_SITE_OTHER): Payer: Medicare Other | Admitting: Family

## 2021-12-11 ENCOUNTER — Telehealth: Payer: Self-pay | Admitting: *Deleted

## 2021-12-11 VITALS — BP 149/84 | HR 80 | Temp 98.4°F | Resp 16 | Wt 277.0 lb

## 2021-12-11 DIAGNOSIS — I10 Essential (primary) hypertension: Secondary | ICD-10-CM

## 2021-12-11 DIAGNOSIS — G4733 Obstructive sleep apnea (adult) (pediatric): Secondary | ICD-10-CM | POA: Diagnosis not present

## 2021-12-11 DIAGNOSIS — H401132 Primary open-angle glaucoma, bilateral, moderate stage: Secondary | ICD-10-CM | POA: Diagnosis not present

## 2021-12-11 DIAGNOSIS — L304 Erythema intertrigo: Secondary | ICD-10-CM | POA: Diagnosis not present

## 2021-12-11 DIAGNOSIS — L89212 Pressure ulcer of right hip, stage 2: Secondary | ICD-10-CM | POA: Diagnosis not present

## 2021-12-11 DIAGNOSIS — E119 Type 2 diabetes mellitus without complications: Secondary | ICD-10-CM | POA: Diagnosis not present

## 2021-12-11 DIAGNOSIS — H5213 Myopia, bilateral: Secondary | ICD-10-CM | POA: Diagnosis not present

## 2021-12-11 DIAGNOSIS — K219 Gastro-esophageal reflux disease without esophagitis: Secondary | ICD-10-CM | POA: Diagnosis not present

## 2021-12-11 DIAGNOSIS — R8290 Unspecified abnormal findings in urine: Secondary | ICD-10-CM

## 2021-12-11 DIAGNOSIS — H2513 Age-related nuclear cataract, bilateral: Secondary | ICD-10-CM | POA: Diagnosis not present

## 2021-12-11 LAB — BASIC METABOLIC PANEL
BUN: 18 mg/dL (ref 6–23)
CO2: 29 mEq/L (ref 19–32)
Calcium: 9.4 mg/dL (ref 8.4–10.5)
Chloride: 103 mEq/L (ref 96–112)
Creatinine, Ser: 1.14 mg/dL (ref 0.40–1.20)
GFR: 51.07 mL/min — ABNORMAL LOW (ref >60.00)
Glucose, Bld: 103 mg/dL — ABNORMAL HIGH (ref 70–99)
Potassium: 4.5 mEq/L (ref 3.5–5.1)
Sodium: 140 mEq/L (ref 135–145)

## 2021-12-11 LAB — POC URINALSYSI DIPSTICK (AUTOMATED)
Bilirubin, UA: NEGATIVE
Blood, UA: NEGATIVE
Glucose, UA: POSITIVE — AB
Ketones, UA: NEGATIVE
Leukocytes, UA: NEGATIVE
Nitrite, UA: NEGATIVE
Protein, UA: NEGATIVE
Spec Grav, UA: 1.015 (ref 1.010–1.025)
Urobilinogen, UA: 0.2 E.U./dL
pH, UA: 6 (ref 5.0–8.0)

## 2021-12-11 LAB — HEMOGLOBIN A1C: Hgb A1c MFr Bld: 6.4 % (ref 4.6–6.5)

## 2021-12-11 LAB — HM DIABETES EYE EXAM

## 2021-12-11 MED ORDER — OZEMPIC (0.25 OR 0.5 MG/DOSE) 2 MG/3ML ~~LOC~~ SOPN: 0.5000 mg | PEN_INJECTOR | SUBCUTANEOUS | 0 refills | Status: DC

## 2021-12-11 MED ORDER — NYSTATIN 100000 UNIT/GM EX POWD: CUTANEOUS | 1 refills | Status: DC

## 2021-12-11 NOTE — Patient Instructions (Signed)
Hold Jardiance when your run out until we can look into patient assistance. Increase ozempic to 0.5mg  once weekly. Complete lab work prior to leaving.

## 2021-12-11 NOTE — Assessment & Plan Note (Signed)
Reports stable, but requests refill on nystatin powder.

## 2021-12-11 NOTE — Assessment & Plan Note (Signed)
Resolved

## 2021-12-11 NOTE — Assessment & Plan Note (Addendum)
BP Readings from Last 3 Encounters:  12/11/21 (!) 149/84  10/17/21 122/80  09/16/21 110/69   Fair bp today.  Continue losartan and carvedilol.

## 2021-12-11 NOTE — Assessment & Plan Note (Signed)
I encouraged her to reschedule her home sleep study. She thought it was the same as her defibrillator.  Discussed that uncontrolled OSA can lead to headaches which have been bothering her recently.

## 2021-12-11 NOTE — Chronic Care Management (AMB) (Signed)
  Chronic Care Management   Note  12/11/2021 Name: Calisha Tindel MRN: [id number] DOB: August 30, 1955  Carola Viramontes is a 66 y.o. year old female who is a primary care patient of Debbrah Alar, NP. I reached out to Roni Bread by phone today in response to a referral sent by Ms. Cordelia Poche Hopke's PCP.  Ms. Deshong was given information about Chronic Care Management services today including:  CCM service includes personalized support from designated clinical staff supervised by her physician, including individualized plan of care and coordination with other care providers 24/7 contact phone numbers for assistance for urgent and routine care needs. Service will only be billed when office clinical staff spend 20 minutes or more in a month to coordinate care. Only one practitioner may furnish and bill the service in a calendar month. The patient may stop CCM services at any time (effective at the end of the month) by phone call to the office staff. The patient is responsible for co-pay (up to 20% after annual deductible is met) if co-pay is required by the individual health plan.   Patient agreed to services and verbal consent obtained.   Follow up plan: Telephone appointment with care management team member scheduled for: 12/20/2021  Julian Hy, [facility]  Direct Dial: [phone]

## 2021-12-11 NOTE — Progress Notes (Signed)
Subjective:   By signing my name below, I, Carylon Perches, attest that this documentation has been prepared under the direction and in the presence of Karie Chimera, NP 12/11/2021     Patient ID: Briana Miller, female    DOB: May 09, 1956, 66 y.o.   MRN: [id number]  Chief Complaint  Patient presents with   Diabetes    Here for follow up, needs med change due to cost   Urine odor    Complains of strong urine odor    HPI Patient is in today for an office visit. She is accompanied by her daughter.   Refill: She is requesting a refill of Nystatin powder.  Odor in Urine: She complains of strong abnormal odor in her urine.  Sweating: She complains of waking up in sweats. She also occasionally has to drink cold water in the morning. Right Toe Bunion: She complains of a right toe bunion.  Headaches: She states that her headaches are persistent Gap Coverage/Ozempic: She states during the month of June, she is in her gap coverage for her insurance and states that getting Ozempic is too expensive for her at the moment. Meal Planning: She is inquiring about nutrition planning.  Sores Under her Breast: She reports that the sores under her breast are controlled. She is currently using Nystatin powder.  Sleep Apnea: She states that her pulmonary specialist asked her to speak to her PCP. She reports that she has done a sleep study in the past. She also reports that her pulmonologist team has called her to schedule a sleep study.  Reflux: She is currently taking 40 Mg of  Pepsid.  Nausea: She is currently taking 4 Mg of Zofran. She states that if she does not take her medication, she feels nauseated.  Pressure Ulcer: She reports that the pressure ulcer is improving. When she sleeps, she tends to rest in her bed and denies of any trouble moving around at night.   Health Maintenance Due  Topic Date Due   FOOT EXAM  Never done   OPHTHALMOLOGY EXAM  Never done   URINE MICROALBUMIN  Never  done   Zoster Vaccines- Shingrix (1 of 2) Never done   PAP SMEAR-Modifier  03/22/2018   COVID-19 Vaccine (4 - Booster for Pfizer series) 08/24/2020    Past Medical History:  Diagnosis Date   Acute systolic congestive heart failure ([facility]) 03/01/2014   Allergy    Anemia    Arthritis    Cardiomyopathy ([place])    Cervical myelopathy (H[facility])    CHF (congestive heart failure) (HCC)    GERD (gastroesophageal reflux disease)    Gout    Heart disease    Hyperlipidemia    Hypertension    Leg pain    Overactive bladder     Past Surgical History:  Procedure Laterality Date   ANTERIOR CERVICAL DECOMP/DISCECTOMY FUSION N/A 08/03/2016   Procedure: ANTERIOR CERVICAL DECOMPRESSION/DISCECTOMY FUSION C4-5, C5-6;  Surgeon: Kristeen Miss, MD;  Location: [place];  Service: Neurosurgery;  Laterality: N/A;   BIV ICD INSERTION CRT-D N/A 09/16/2021   Procedure: BIV ICD INSERTION CRT-D;  Surgeon: Evans Lance, MD;  Location: [facility];  Service: Cardiovascular;  Laterality: N/A;   COLONOSCOPY     COLONOSCOPY WITH PROPOFOL N/A 02/24/2019   Procedure: COLONOSCOPY WITH PROPOFOL;  Surgeon: Milus Banister, MD;  Location: [facility];  Service: Endoscopy;  Laterality: N/A;   ESOPHAGOGASTRODUODENOSCOPY (EGD) WITH PROPOFOL N/A 04/25/2021   Procedure: ESOPHAGOGASTRODUODENOSCOPY (EGD) WITH  PROPOFOL;  Surgeon: Milus Banister, MD;  Location: [facility];  Service: Endoscopy;  Laterality: N/A;   KNEE ARTHROSCOPY     2007   ROTATOR CUFF REPAIR Right 03/23/13   TOTAL KNEE ARTHROPLASTY  09/22/2011   Procedure: TOTAL KNEE ARTHROPLASTY;  Surgeon: Rudean Haskell, MD;  Location: [facility];  Service: Orthopedics;  Laterality: Right;    Family History  Problem Relation Age of Onset   Cancer Mother        colon 77 and breast 74- deceased   Colon cancer Mother    Diabetes Father        living   Asthma Father    Hypertension Father    Heart attack Father 7       medical management per pt   Glaucoma Father         had eye transplant   Kidney failure Father        acute renal failure- died 46   Esophageal cancer Neg Hx    Stomach cancer Neg Hx    Rectal cancer Neg Hx     Social History   Socioeconomic History   Marital status: Married    Spouse name: Not on file   Number of children: 2   Years of education: HS   Highest education level: Not on file  Occupational History   Occupation: Psychiatric nurse: [employer]  Tobacco Use   Smoking status: Never   Smokeless tobacco: Never  Vaping Use   Vaping Use: Never used  Substance and Sexual Activity   Alcohol use: Not Currently    Alcohol/week: 2.0 standard drinks of alcohol    Types: 2 Glasses of wine per week   Drug use: No   Sexual activity: Not on file  Other Topics Concern   Not on file  Social History Narrative   Married- 33 years   2 children- grown daughter- lives in [place]- has 2 children   Grown son lives in [place]- 4 children   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   1-2 cups caffeine daily.   Lives at home with her husband.   Social Determinants of Health   Financial Resource Strain: Not on file  Food Insecurity: Not on file  Transportation Needs: Not on file  Physical Activity: Not on file  Stress: Not on file  Social Connections: Not on file  Intimate Partner Violence: Not on file    Outpatient Medications Prior to Visit  Medication Sig Dispense Refill   ACCU-CHEK GUIDE test strip USE UP TO 4 TIMES DAILY 400 strip 3   Accu-Chek Softclix Lancets lancets USE UP TO 4 TIMES DAILY 400 each 3   acetaminophen (TYLENOL) 500 MG tablet Take 1,000 mg by mouth every 8 (eight) hours as needed for moderate pain or headache.     albuterol (VENTOLIN HFA) 108 (90 Base) MCG/ACT inhaler Inhale 2 puffs into the lungs every 6 (six) hours as needed for wheezing or shortness of breath. 8 g 6   allopurinol (ZYLOPRIM) 100 MG  tablet TAKE 1 TABLET BY MOUTH  DAILY (Patient taking differently: Take 100 mg by mouth daily as needed (gout).) 90 tablet 3   Blood Glucose Monitoring Suppl (ACCU-CHEK GUIDE) w/Device KIT USE AS DIRECTED 1 kit 0   carvedilol (COREG) 12.5 MG tablet TAKE 1 TABLET BY MOUTH  TWICE DAILY 180 tablet  4   celecoxib (CELEBREX) 100 MG capsule Take 100 mg by mouth daily as needed for pain.     cholecalciferol (VITAMIN D3) 25 MCG (1000 UNIT) tablet Take 1,000 Units by mouth daily.     Control Gel Formula Dressing (DUODERM CGF DRESSING) MISC Apply dressing to clean dry wound every 3 days. 5 each 3   empagliflozin (JARDIANCE) 10 MG TABS tablet Take 1 tablet (10 mg total) by mouth daily before breakfast. 30 tablet 3   famotidine (PEPCID) 40 MG tablet TAKE 1 TABLET BY MOUTH  TWICE DAILY 180 tablet 3   fluticasone (FLONASE) 50 MCG/ACT nasal spray Place 2 sprays into both nostrils daily. (Patient taking differently: Place 2 sprays into both nostrils daily as needed for allergies.) 16 g 1   gabapentin (NEURONTIN) 300 MG capsule TAKE 3 CAPSULES BY MOUTH 3  TIMES DAILY 810 capsule 3   latanoprost (XALATAN) 0.005 % ophthalmic solution Place 1 drop into both eyes at bedtime.   11   ondansetron (ZOFRAN) 4 MG tablet TAKE 1 TABLET BY MOUTH EVERY 4  TO 6 HOURS AS NEEDED FOR NAUSEA 90 tablet 0   pantoprazole (PROTONIX) 40 MG tablet TAKE 1 TABLET BY MOUTH  TWICE DAILY 180 tablet 3   potassium chloride SA (KLOR-CON M) 20 MEQ tablet TAKE 1 TABLET BY MOUTH  DAILY 90 tablet 1   spironolactone (ALDACTONE) 25 MG tablet TAKE 1 TABLET BY MOUTH  DAILY 90 tablet 3   Tart Cherry 1200 MG CAPS Take 1,200 mg by mouth 2 (two) times a week.     timolol (BETIMOL) 0.5 % ophthalmic solution Place 1 drop into both eyes every morning.      torsemide (DEMADEX) 20 MG tablet Take 1 tablet (20 mg total) by mouth daily. TAKE 2 TABLETS BY MOUTH  ALTERNATING WITH 1 TABLET  EVERY OTHER DAY 135 tablet 3   fluconazole (DIFLUCAN) 150 MG tablet Take 1 tablet  (150 mg total) by mouth daily. 1 tablet 0   nystatin (MYCOSTATIN/NYSTOP) powder APPLY TOPICALLY TO AFFECTED  AREA(S) 3 TIMES DAILY AS NEEDED  FOR RASH 180 g 0   Semaglutide,0.25 or 0.5MG/DOS, (OZEMPIC, 0.25 OR 0.5 MG/DOSE,) 2 MG/1.5ML SOPN Inject 0.25 mg into the skin once a week. 3 mL 1   losartan (COZAAR) 25 MG tablet Take 1 tablet (25 mg total) by mouth daily. 90 tablet 3   No facility-administered medications prior to visit.    Allergies  Allergen Reactions   Entresto [Sacubitril-Valsartan] Other (See Comments)    "Dry mouth"    Review of Systems  Constitutional:  Positive for diaphoresis.  Genitourinary:        (+) Urine Odor  Skin:        (+) Right Toe Bunion       Objective:    Physical Exam Constitutional:      General: She is not in acute distress.    Appearance: Normal appearance. She is not ill-appearing.  HENT:     Head: Normocephalic and atraumatic.     Right Ear: External ear normal.     Left Ear: External ear normal.  Eyes:     Extraocular Movements: Extraocular movements intact.     Pupils: Pupils are equal, round, and reactive to light.  Cardiovascular:     Rate and Rhythm: Normal rate and regular rhythm.     Heart sounds: Normal heart sounds. No murmur heard.    No gallop.  Pulmonary:     Effort: Pulmonary effort is normal.  No respiratory distress.     Breath sounds: Normal breath sounds. No wheezing or rales.  Musculoskeletal:     Right lower leg: 2+ Edema present.     Left lower leg: 2+ Edema present.  Skin:    General: Skin is warm and dry.  Neurological:     Mental Status: She is alert and oriented to person, place, and time.  Psychiatric:        Mood and Affect: Mood normal.        Behavior: Behavior normal.        Judgment: Judgment normal.     BP (!) 149/84 (BP Location: Right Arm, Patient Position: Sitting, Cuff Size: Large)   Pulse 80   Temp 98.4 F (36.9 C) (Oral)   Resp 16   Wt 277 lb (125.6 kg) Comment: patient weights 458lb  on the 181lb wheelchair  LMP 06/17/2007   SpO2 98%   BMI 44.71 kg/m  Wt Readings from Last 3 Encounters:  12/11/21 277 lb (125.6 kg)  10/17/21 225 lb (102.1 kg)  09/16/21 220 lb (99.8 kg)       Assessment & Plan:   Problem List Items Addressed This Visit       Unprioritized   Pressure injury of right thigh, stage 2 (HCC)    Resolved.        Obstructive sleep apnea    I encouraged her to reschedule her home sleep study. She thought it was the same as her defibrillator.  Discussed that uncontrolled OSA can lead to headaches which have been bothering her recently.       Intertrigo    Reports stable, but requests refill on nystatin powder.       HTN (hypertension)    BP Readings from Last 3 Encounters:  12/11/21 (!) 149/84  10/17/21 122/80  09/16/21 110/69  Fair bp today.  Continue losartan and carvedilol.       GERD (gastroesophageal reflux disease)    She continues pepcid. Also uses zofran prn nausea.        Controlled type 2 diabetes mellitus without complication, without long-term current use of insulin ([facility]) - Primary    Lab Results  Component Value Date   HGBA1C 6.7 (H) 08/02/2021   HGBA1C 6.9 (H) 02/22/2021   HGBA1C 6.8 (H) 10/15/2020   Lab Results  Component Value Date   LDLCALC 97 12/02/2019   CREATININE 1.56 (H) 09/06/2021  Can't afford ozempic or jardiance while in the donut hole.  We have ozempic samples. Will increase ozempic from 0.73m to 0.573monce weekly. Samples provided. Will have her finish up her jardiance and will see if she may qualify for patient assistance with our pharmacist. She is also requesting a referral to Nutrition for help with meal planning.        Relevant Medications   Semaglutide,0.25 or 0.5MG/DOS, (OZEMPIC, 0.25 OR 0.5 MG/DOSE,) 2 MG/3ML SOPN   Other Relevant Orders   AMB Referral to [facility] Care Coordinaton   Amb ref to Medical Nutrition Therapy-MNT   Hemoglobin [id number] Basic metabolic panel   Other Visit Diagnoses      Abnormal urine odor       Relevant Orders   POCT Urinalysis Dipstick (Automated) (Completed)   Urine Culture      Handicapped form filled for patient and daughter was provided with the completed family FMLA form.  Meds ordered this encounter  Medications   nystatin (MYCOSTATIN/NYSTOP) powder    Sig: APPLY TOPICALLY TO AFFECTED  AREA(S) 3 TIMES DAILY AS NEEDED  FOR RASH    Dispense:  180 g    Refill:  1    Order Specific Question:   Supervising Provider    Answer:   Penni Homans A [[id number]]   Semaglutide,0.25 or 0.5MG/DOS, (OZEMPIC, 0.25 OR 0.5 MG/DOSE,) 2 MG/3ML SOPN    Sig: Inject 0.5 mg into the skin once a week.    Dispense:  6 mL    Refill:  0    Order Specific Question:   Supervising Provider    Answer:   Penni Homans A [[id number]]    I, Nance Pear, NP, personally preformed the services described in this documentation.  All medical record entries made by the scribe were at my direction and in my presence.  I have reviewed the chart and discharge instructions (if applicable) and agree that the record reflects my personal performance and is accurate and complete. 12/11/2021  I,Amber Collins,acting as a scribe for Nance Pear, NP.,have documented all relevant documentation on the behalf of Nance Pear, NP,as directed by  Nance Pear, NP while in the presence of Nance Pear, NP.   Nance Pear, NP

## 2021-12-11 NOTE — Assessment & Plan Note (Addendum)
Lab Results  Component Value Date   HGBA1C 6.7 (H) 08/02/2021   HGBA1C 6.9 (H) 02/22/2021   HGBA1C 6.8 (H) 10/15/2020   Lab Results  Component Value Date   LDLCALC 97 12/02/2019   CREATININE 1.56 (H) 09/06/2021   Can't afford ozempic or jardiance while in the donut hole.  We have ozempic samples. Will increase ozempic from 0.25mg  to 0.5mg  once weekly. Samples provided. Will have her finish up her jardiance and will see if she may qualify for patient assistance with our pharmacist. She is also requesting a referral to Nutrition for help with meal planning.

## 2021-12-11 NOTE — Assessment & Plan Note (Signed)
She continues pepcid. Also uses zofran prn nausea.

## 2021-12-12 LAB — URINE CULTURE
MICRO NUMBER:: 13583541
SPECIMEN QUALITY:: ADEQUATE

## 2021-12-16 ENCOUNTER — Telehealth: Payer: Self-pay | Admitting: Pharmacist

## 2021-12-16 NOTE — Chronic Care Management (AMB) (Signed)
Chronic Care Management Pharmacy Assistant   Name: Shadoe Cryan  MRN: [id number] DOB: 09/21/1955  Ikia Cincotta is an 66 y.o. year old female who presents for his initial CCM visit with the clinical pharmacist.  Recent office visits:  12/11/21-[name] Inda Castle , NP (PCP) Seen for diabetic follow up and urine oder visit.  Will increase ozempic from 0.21m to 0.530monce weekly. Samples provided. [facility] Labs ordered. Hold Jardiance when your run out until we can look into patient assistance. Follow up in 3 months. 09/09/21-[name] O'Inda Castle NP (PCP, Video visit)  Seen for a wound check. Follow up in 2 weeks. 08/26/21-[name] O'Inda Castle NP (PCP, Video visit) Seen for neck pain and facial pain. 08/02/21-[name] O'Inda Castle NP (PCP) Diabetic follow up visit. Labs ordered. 07/22/21-[name] O'Inda Castle NP (PCP, Video visit) Seen for neck pain and headache. MRI ordered. Follow up in 2 weeks. 07/04/21-Edward Saguier PA-C (Video visit) Seen for a cough. Follow up in 7 days.  Recent consult visits:  10/17/21-Clinton D. YoAnnamaria BootsMD (Pulmonology) General follow up visit. Follow up in 4 weeks. 09/06/21-Gregg W. TaLovena LeMD (Cardiology) General cardiac follow up visit. Labs ordered. 08/02/21-Follow up cardiac visit. Start Jardiance 10 mg ( Take 1 Tablet Daily). Follow up in 2 months.  Hospital visits:  None in previous 6 months  Medications: Outpatient Encounter Medications as of 12/16/2021  Medication Sig   ACCU-CHEK GUIDE test strip USE UP TO 4 TIMES DAILY   Accu-Chek Softclix Lancets lancets USE UP TO 4 TIMES DAILY   acetaminophen (TYLENOL) 500 MG tablet Take 1,000 mg by mouth every 8 (eight) hours as needed for moderate pain or headache.   albuterol (VENTOLIN HFA) 108 (90 Base) MCG/ACT inhaler Inhale 2 puffs into the lungs every 6 (six) hours as needed for wheezing or shortness of breath.    allopurinol (ZYLOPRIM) 100 MG tablet TAKE 1 TABLET BY MOUTH  DAILY (Patient taking differently: Take 100 mg by mouth daily as needed (gout).)   Blood Glucose Monitoring Suppl (ACCU-CHEK GUIDE) w/Device KIT USE AS DIRECTED   carvedilol (COREG) 12.5 MG tablet TAKE 1 TABLET BY MOUTH  TWICE DAILY   celecoxib (CELEBREX) 100 MG capsule Take 100 mg by mouth daily as needed for pain.   cholecalciferol (VITAMIN D3) 25 MCG (1000 UNIT) tablet Take 1,000 Units by mouth daily.   Control Gel Formula Dressing (DUODERM CGF DRESSING) MISC Apply dressing to clean dry wound every 3 days.   empagliflozin (JARDIANCE) 10 MG TABS tablet Take 1 tablet (10 mg total) by mouth daily before breakfast.   famotidine (PEPCID) 40 MG tablet TAKE 1 TABLET BY MOUTH  TWICE DAILY   fluticasone (FLONASE) 50 MCG/ACT nasal spray Place 2 sprays into both nostrils daily. (Patient taking differently: Place 2 sprays into both nostrils daily as needed for allergies.)   gabapentin (NEURONTIN) 300 MG capsule TAKE 3 CAPSULES BY MOUTH 3  TIMES DAILY   latanoprost (XALATAN) 0.005 % ophthalmic solution Place 1 drop into both eyes at bedtime.    losartan (COZAAR) 25 MG tablet Take 1 tablet (25 mg total) by mouth daily.   nystatin (MYCOSTATIN/NYSTOP) powder APPLY TOPICALLY TO AFFECTED  AREA(S) 3 TIMES DAILY AS NEEDED  FOR RASH   ondansetron (ZOFRAN) 4 MG tablet TAKE 1 TABLET BY MOUTH EVERY 4  TO 6 HOURS AS NEEDED FOR NAUSEA   pantoprazole (PROTONIX) 40 MG tablet TAKE 1 TABLET BY MOUTH  TWICE DAILY  potassium chloride SA (KLOR-CON M) 20 MEQ tablet TAKE 1 TABLET BY MOUTH  DAILY   Semaglutide,0.25 or 0.5MG/DOS, (OZEMPIC, 0.25 OR 0.5 MG/DOSE,) 2 MG/3ML SOPN Inject 0.5 mg into the skin once a week.   spironolactone (ALDACTONE) 25 MG tablet TAKE 1 TABLET BY MOUTH  DAILY   Tart Cherry 1200 MG CAPS Take 1,200 mg by mouth 2 (two) times a week.   timolol (BETIMOL) 0.5 % ophthalmic solution Place 1 drop into both eyes every morning.    torsemide (DEMADEX) 20  MG tablet Take 1 tablet (20 mg total) by mouth daily. TAKE 2 TABLETS BY MOUTH  ALTERNATING WITH 1 TABLET  EVERY OTHER DAY   No facility-administered encounter medications on file as of 12/16/2021.   Acetaminophen (TYLENOL) 500 MG tablet Last filled:None noted Albuterol (VENTOLIN HFA) 108 (90 Base) MCG/ACT inhaler Last filled:10/24/21 90 DS Allopurinol (ZYLOPRIM) 100 MG tablet Last filled:08/30/21 90 DS Carvedilol (COREG) 12.5 MG tablet Last filled:08/30/21 90 DS  Celecoxib (CELEBREX) 100 MG capsule Last filled:10/21/21 90 DS Cholecalciferol (VITAMIN D3) 25 MCG (1000 UNIT) tablet Last filled: None noted Empagliflozin (JARDIANCE) 10 MG TABS tablet Last filled:12/11/21 0 DS Famotidine (PEPCID) 40 MG tablet Last filled:11/06/21 90 DS Fluticasone (FLONASE) 50 MCG/ACT nasal spray Last filled:08/26/21 30 DS Gabapentin (NEURONTIN) 300 MG capsule Last filled:10/21/21 90 DS Latanoprost (XALATAN) 0.005 % ophthalmic solution Last filled:12/11/21 90 DS Losartan (COZAAR) 25 MG tablet (Expired) Last filled:10/21/21 90 DS Nystatin (MYCOSTATIN/NYSTOP) powder Last filled:10/24/21 90 DS Ondansetron (ZOFRAN) 4 MG tablet Last filled:10/23/21 15 DS Pantoprazole (PROTONIX) 40 MG tablet Last filled:10/21/21 90 DS Potassium chloride SA (KLOR-CON M) 20 MEQ tablet Last filled:11/01/21 90 DS Semaglutide,0.25 or 0.5MG/DOS, (OZEMPIC, 0.25 OR 0.5 MG/DOSE,) 2 MG/3ML SOPN Last filled:09/24/21 56 DS Spironolactone (ALDACTONE) 25 MG tablet Last filled:10/21/21 90 DS Tart Cherry 1200 MG CAPS Last filled:None noted Timolol (BETIMOL) 0.5 % ophthalmic solution Last filled:12/11/21 90 DS    Care Gaps: FOOT EXAM:Never done OPHTHALMOLOGY EXAM:Never done URINE MICROALBUMIN:Never done Zoster Vaccines- Shingrix:Never done PAP SMEAR-Modifier:Last completed: Mar 23, 2015  Star Rating Drugs: Empagliflozin (JARDIANCE) 10 MG TABS tablet Last filled:12/11/21 0 DS Losartan (COZAAR) 25 MG tablet (Expired) Last filled:10/21/21 90  DS Semaglutide,0.25 or 0.5MG/DOS, (OZEMPIC, 0.25 OR 0.5 MG/DOSE,) 2 MG/3ML SOPN Last filled:09/24/21 56 DS  Myriam Elta [place], [place]

## 2021-12-18 ENCOUNTER — Ambulatory Visit (INDEPENDENT_AMBULATORY_CARE_PROVIDER_SITE_OTHER): Payer: Medicare Other

## 2021-12-18 DIAGNOSIS — I428 Other cardiomyopathies: Secondary | ICD-10-CM

## 2021-12-20 ENCOUNTER — Ambulatory Visit (INDEPENDENT_AMBULATORY_CARE_PROVIDER_SITE_OTHER): Payer: Medicare Other | Admitting: Pharmacist

## 2021-12-20 DIAGNOSIS — M109 Gout, unspecified: Secondary | ICD-10-CM

## 2021-12-20 DIAGNOSIS — E785 Hyperlipidemia, unspecified: Secondary | ICD-10-CM

## 2021-12-20 DIAGNOSIS — I10 Essential (primary) hypertension: Secondary | ICD-10-CM

## 2021-12-20 DIAGNOSIS — E119 Type 2 diabetes mellitus without complications: Secondary | ICD-10-CM

## 2021-12-20 NOTE — Patient Instructions (Incomplete)
Mrs. Affinito, It was a pleasure speaking with you  Below is a summary of your health goals and care plan    If you have any questions or concerns, please feel free to contact me either at the phone number below or with a MyChart message.   Keep up the good work!  Henrene Pastor, PharmD Clinical Pharmacist [facility] [phone] (direct line)  [phone] (main office number)   {CCM PT PRINT INSTRUCTIONS:[id number]}    Gout diet: What's allowed, what's not Starting a gout diet? Understand which foods are OK and which to avoid. By [facility] Staff Gout is a painful form of arthritis that occurs when high levels of uric acid in the blood cause crystals to form and accumulate in and around a joint. Uric acid is produced when the body breaks down a chemical called purine. Purine occurs naturally in your body, but it's also found in certain foods. Uric acid is eliminated from the body in urine. A gout diet may help decrease uric acid levels in the blood. A gout diet isn't a cure. But it may lower the risk of recurring gout attacks and slow the progression of joint damage. People with gout who follow a gout diet generally still need medication to manage pain and to lower levels of uric acid. Gout diet goals A gout diet is designed to help you: Achieve a healthy weight and good eating habits Avoid some, but not all, foods with purines Include some foods that can control uric acid levels A good rule of thumb is to eat moderate portions of healthy foods. Diet details The general principles of a gout diet follow typical healthy-diet recommendations: Weight loss. Being overweight increases the risk of developing gout, and losing weight lowers the risk of gout. Research suggests that reducing the number of calories and losing weight -- even without a purine-restricted diet -- lower uric acid levels and reduce the number of gout attacks. Losing weight also lessens  the overall stress on joints. Complex carbs. Eat more fruits, vegetables and whole grains, which provide complex carbohydrates. Avoid foods and beverages with high-fructose corn syrup, and limit consumption of naturally sweet fruit juices. Water. Stay well-hydrated by drinking water. Fats. Cut back on saturated fats from red meat, fatty poultry and high-fat dairy products. Proteins. Focus on lean meat and poultry, low-fat dairy and lentils as sources of protein.   Foods that might increase uric acid: Organ and glandular meats. Avoid meats such as liver, kidney and sweetbreads, which have high purine levels and contribute to high blood levels of uric acid. Red meat. Limit serving sizes of beef, lamb and pork. Seafood. Some types of seafood -- such as anchovies, shellfish (shrimp, oysters crab), sardines and tuna -- are higher in purines than are other types. But the overall health benefits of eating fish may outweigh the risks for people with gout. Moderate portions of fish can be part of a gout diet. High-purine vegetables. Studies have shown that vegetables high in purines, such as asparagus and spinach, don't increase the risk of gout or recurring gout attacks. Alcohol. Beer and distilled liquors are associated with an increased risk of gout and recurring attacks. Moderate consumption of wine doesn't appear to increase the risk of gout attacks. Avoid alcohol during gout attacks, and limit alcohol, especially beer, between attacks. Sugary foods and beverages. Limit or avoid sugar-sweetened foods such as sweetened cereals, bakery goods and candies. Limit consumption of naturally sweet fruit juices. Foods  that might lower gout risk: Vitamin C. Vitamin C may help lower uric acid levels. Talk to your doctor about whether a 500-milligram vitamin C supplement fits into your diet and medication plan. Coffee. Some research suggests that drinking coffee in moderation, especially regular caffeinated coffee,  may be associated with a reduced risk of gout. Drinking coffee may not be appropriate if you have other medical conditions. Talk to your doctor about how much coffee is right for you. Cherries. There is some evidence that eating cherries is associated with a reduced risk of gout attacks.  Results Following a gout diet can help limit uric acid production and increase its elimination. A gout diet isn't likely to lower the uric acid concentration in your blood enough to treat your gout without medication. But it may help decrease the number of attacks and limit their severity. Following a gout diet, along with limiting calories and getting regular exercise, can also improve your overall health by helping you achieve and maintain a healthy weight.

## 2021-12-21 LAB — CUP PACEART REMOTE DEVICE CHECK
Battery Remaining Longevity: 48 mo
Battery Remaining Percentage: 87 %
Battery Voltage: 3.04 V
Brady Statistic AP VP Percent: 1 %
Brady Statistic AP VS Percent: 1 %
Brady Statistic AS VP Percent: 99 %
Brady Statistic AS VS Percent: 1 %
Brady Statistic RA Percent Paced: 1 %
Date Time Interrogation Session: 20230705051116
HighPow Impedance: 77 Ohm
HighPow Impedance: 77 Ohm
Implantable Lead Implant Date: 20230403
Implantable Lead Implant Date: 20230403
Implantable Lead Implant Date: 20230403
Implantable Lead Location: 753858
Implantable Lead Location: 753859
Implantable Lead Location: 753860
Implantable Pulse Generator Implant Date: 20230403
Lead Channel Impedance Value: 430 Ohm
Lead Channel Impedance Value: 460 Ohm
Lead Channel Impedance Value: 810 Ohm
Lead Channel Pacing Threshold Amplitude: 0.75 V
Lead Channel Pacing Threshold Amplitude: 1 V
Lead Channel Pacing Threshold Amplitude: 1.25 V
Lead Channel Pacing Threshold Pulse Width: 0.5 ms
Lead Channel Pacing Threshold Pulse Width: 0.5 ms
Lead Channel Pacing Threshold Pulse Width: 0.5 ms
Lead Channel Sensing Intrinsic Amplitude: 12 mV
Lead Channel Sensing Intrinsic Amplitude: 5 mV
Lead Channel Setting Pacing Amplitude: 3.5 V
Lead Channel Setting Pacing Amplitude: 3.5 V
Lead Channel Setting Pacing Amplitude: 3.5 V
Lead Channel Setting Pacing Pulse Width: 0.5 ms
Lead Channel Setting Pacing Pulse Width: 0.5 ms
Lead Channel Setting Sensing Sensitivity: 0.5 mV
Pulse Gen Serial Number: 9986369

## 2021-12-24 ENCOUNTER — Encounter: Payer: Medicare Other | Admitting: Internal Medicine

## 2021-12-24 NOTE — Chronic Care Management (AMB) (Unsigned)
Chronic Care Management Pharmacy Note  12/25/2021 Name:  Briana Miller MRN:  [id number] DOB:  [birth year]  Summary: Type 2 DM and CHF: Patient is having difficulty with cost of medications, specifically Jardiance and Ozempic. Assessed patient finances and screened for manufacturer medication assistance programs eligibility and for disease state assistance programs. Patient's husband currently has full time job and household income is > $100,000 per year making her ineligible for assistance programs at this time. A short-term solution might be to change to [place] and provide patient with coupon to get 30 days free. I would like to see her continue on an SGLT2 due to diagnosis of CHF / cardiomyopathy. Collaborating with her PCP regarding trial of Farxiga in place of Jardiance. If she does continue SGLT2, then could lower dose of Ozempic to 0.101m weekly and the sample pens she was given at last visit will last about 3 months.  Also provided education on  signs and symptoms of CHF exacerbation - weight gain, SOB, abdominal fullness, swelling in legs or abdomen, Fatigue and weakness, changes in ability to perform usual activities, persistent cough or wheezing with white or pink blood-tinged mucus, nausea and lack of appetite. Patient had question about why she was taking 2 fluid pills. Counseled on benefits of taking both torsemide and spironolactone for CHF.  CVD Risk Recuction / Hyperlipidemia: Recommend statin therapy due to type 2 DM. Will discuss with PCP about trial of either rosuvastatin 13mor atorvastatin 2068maily for CVD risk reduction.  Depression/Anxiety: Patient mentions today that she has had a lot of anxiety surrounding her grandson's mental health and legal troubles / arrests. PHQ9 Score was 12 today. Provided phone number for [facility] for patient and also for her grandson. They can present to the clinic at anytime for assessment and treatment. Coordinating  with PCP regarding patient possibly starting SSRI to help with depression and anxiety. Will likely make appointment for the to see PCP to discuss further.   Gout: Fairly well controlled. Patient reports about 3 acute gout episodes per year. She is only taking allopurinol as needed. Recommended patient take allopurinol 100m64mERY daily to prevent gout. She can restart TartZannie Coveshe chooses. It has been shown to help prevent gout and is thought to be safe to use. Provided diet information regarding prevention of gout with food to avoid / limit.  Follow Up Plan: Telephone follow up appointment with care management team member scheduled for:  2 to 3 weeks.      Subjective: Briana Miller 66 y4. year old female who is a primary patient of O'SuDebbrah Alar.  The CCM team was consulted for assistance with disease management and care coordination needs.    Engaged with patient by telephone for initial visit in response to provider referral for pharmacy case management and/or care coordination services.   Consent to Services:  The patient was given the following information about Chronic Care Management services today, agreed to services, and gave verbal consent: 1. CCM service includes personalized support from designated clinical staff supervised by the primary care provider, including individualized plan of care and coordination with other care providers 2. 24/7 contact phone numbers for assistance for urgent and routine care needs. 3. Service will only be billed when office clinical staff spend 20 minutes or more in a month to coordinate care. 4. Only one practitioner may furnish and bill the service in a calendar month. 5.The patient may stop CCM  services at any time (effective at the end of the month) by phone call to the office staff. 6. The patient will be responsible for cost sharing (co-pay) of up to 20% of the service fee (after annual deductible is met). Patient agreed to  services and consent obtained.  Patient Care Team: Debbrah Alar, NP as PCP - General (Internal Medicine) Stanford Breed Denice Bors, MD as PCP - Cardiology (Cardiology) Cherre Robins, RPH-CPP (Pharmacist)  Recent office visits: 12/11/21-[name] Inda Castle , NP (PCP) Seen for diabetic follow up and urine oder visit.  Will increase ozempic from 0.65m to 0.575monce weekly. Samples provided. [facility]. Labs ordered. Hold Jardiance when your run out until we can look into patient assistance. Follow up in 3 months. 09/09/21-[name] O'Inda Castle NP (PCP, Video visit)  Seen for a wound check. Follow up in 2 weeks. 08/26/21-[name] O'Inda Castle NP (PCP, Video visit) Seen for neck pain and facial pain. 08/02/21-[name] O'Inda Castle NP (PCP) Diabetic follow up visit. Labs ordered. 07/22/21-[name] O'Inda Castle NP (PCP, Video visit) Seen for neck pain and headache. MRI ordered. Follow up in 2 weeks. 07/04/21-Edward Saguier PA-C (Video visit) Seen for a cough. Follow up in 7 days.  Recent consult visits: 10/17/21-Clinton D. YoAnnamaria BootsMD (Pulmonology) General follow up visit. Follow up in 4 weeks. 09/06/21-Gregg W. TaLovena LeMD (Cardiology) General cardiac follow up visit. Labs ordered. 08/02/21-Follow up cardiac visit. Start Jardiance 10 mg ( Take 1 Tablet Daily). Follow up in 2 months.  Hospital visits: None in previous 6 months  Objective:  Lab Results  Component Value Date   CREATININE 1.14 12/11/2021   CREATININE 1.56 (H) 09/06/2021   CREATININE 1.37 (H) 08/02/2021    Lab Results  Component Value Date   HGBA1C 6.4 12/11/2021   Last diabetic Eye exam: No results found for: "HMDIABEYEEXA"  Last diabetic Foot exam: No results found for: "HMDIABFOOTEX"      Component Value Date/Time   CHOL 170 12/02/2019 1434   TRIG 140 12/02/2019 1434   HDL 49 (L) 12/02/2019 1434   CHOLHDL 3.5 12/02/2019 1434   VLDL 23.8 01/21/2019  1350   LDLCALC 97 12/02/2019 1434       Latest Ref Rng & Units 02/15/2021   11:43 AM 04/08/2019    2:06 PM 01/21/2019    1:50 PM  Hepatic Function  Total Protein 6.0 - 8.3 g/dL 7.4  7.2  7.7   Albumin 3.5 - 5.2 g/dL 4.1  4.2  4.6   AST 0 - 37 U/L _0 ALT 0 - 35 U/L _1 Alk Phosphatase 39 - 117 U/L 96  87  95   Total Bilirubin 0.2 - 1.2 mg/dL 0.6  0.6  0.3   Bilirubin, Direct 0.0 - 0.3 mg/dL  0.1      Lab Results  Component Value Date/Time   TSH 1.97 02/15/2021 11:43 AM   TSH 2.954 08/02/2016 02:15 AM   TSH 1.53 03/23/2015 02:30 PM       Latest Ref Rng & Units 09/06/2021   11:05 AM 02/15/2021   11:43 AM 04/08/2019    2:06 PM  CBC  WBC 3.4 - 10.8 x10E3/uL 4.7  5.0  6.0   Hemoglobin 11.1 - 15.9 g/dL 13.1  12.1  11.9   Hematocrit 34.0 - 46.6 % 39.8  36.5  35.9   Platelets 150 - 450 x10E3/uL 270  264.0  269.0  No results found for: "VD25OH"  Clinical ASCVD: No  The 10-year ASCVD risk score (Arnett DK, et al., 2019) is: 23.9%   Values used to calculate the score:     Age: 66 years     Sex: Female     Is Non-Hispanic African American: Yes     Diabetic: Yes     Tobacco smoker: No     Systolic Blood Pressure: 465 mmHg     Is BP treated: Yes     HDL Cholesterol: 49 mg/dL     Total Cholesterol: 170 mg/dL    Other: (CHADS2VASc if Afib, PHQ9 if depression, MMRC or CAT for COPD, ACT, DEXA)  Social History   Tobacco Use  Smoking Status Never  Smokeless Tobacco Never   BP Readings from Last 3 Encounters:  12/11/21 (!) 149/84  10/17/21 122/80  09/16/21 110/69   Pulse Readings from Last 3 Encounters:  12/11/21 80  10/17/21 80  09/16/21 82   Wt Readings from Last 3 Encounters:  12/11/21 277 lb (125.6 kg)  10/17/21 225 lb (102.1 kg)  09/16/21 220 lb (99.8 kg)    Assessment: Review of patient past medical history, allergies, medications, health status, including review of consultants reports, laboratory and other test data, was performed as part  of comprehensive evaluation and provision of chronic care management services.   SDOH:  (Social Determinants of Health) assessments and interventions performed:  SDOH Interventions    Flowsheet Row Most Recent Value  SDOH Interventions   Food Insecurity Interventions Intervention Not Indicated  Financial Strain Interventions Other (Comment)  [patient screened for patient assistance programs and disease state fund. Household income did not meet requirements.]  Transportation Interventions Intervention Not Indicated  Depression Interventions/Treatment  Medication  [Will discuss with patient's PCP]       CCM Care Plan  Allergies  Allergen Reactions   Entresto [Sacubitril-Valsartan] Other (See Comments)    "Dry mouth"    Medications Reviewed Today     Reviewed by Cherre Robins, RPH-CPP (Pharmacist) on 12/20/21 at 71  Med List Status: <None>   Medication Order Taking? Sig Documenting Provider Last Dose Status Informant  ACCU-CHEK GUIDE test strip [id number] Yes USE UP TO 4 TIMES DAILY Debbrah Alar, NP Taking Active   Accu-Chek Softclix Lancets lancets [id number] Yes USE UP TO 4 TIMES DAILY Debbrah Alar, NP Taking Active   acetaminophen (TYLENOL) 500 MG tablet [id number] Yes Take 1,000 mg by mouth every 8 (eight) hours as needed for moderate pain or headache. [provider] Taking Active Self  albuterol (VENTOLIN HFA) 108 (90 Base) MCG/ACT inhaler [id number] Yes Inhale 2 puffs into the lungs every 6 (six) hours as needed for wheezing or shortness of breath. Baird Lyons D, MD Taking Active   allopurinol (ZYLOPRIM) 100 MG tablet [id number] Yes TAKE 1 TABLET BY MOUTH  DAILY  Patient taking differently: Take 100 mg by mouth daily as needed (gout).   Debbrah Alar, NP Taking Active Self  Blood Glucose Monitoring Suppl (ACCU-CHEK GUIDE) w/Device KIT [id number] Yes USE AS DIRECTED Debbrah Alar, NP Taking Active   carvedilol (COREG) 12.5 MG tablet [id number]  Yes TAKE 1 TABLET BY MOUTH  TWICE DAILY Croitoru, Mihai, MD Taking Active Self  celecoxib (CELEBREX) 100 MG capsule [id number] Yes Take 100 mg by mouth daily as needed for pain. [provider] Taking Active Self  cholecalciferol (VITAMIN D3) 25 MCG (1000 UNIT) tablet [id number] Yes Take 1,000 Units by mouth daily. [provider] Taking Active Self  empagliflozin (JARDIANCE)  10 MG TABS tablet [id number] Yes Take 1 tablet (10 mg total) by mouth daily before breakfast. Lenna Sciara, NP Taking Active Self  famotidine (PEPCID) 40 MG tablet [id number] Yes TAKE 1 TABLET BY MOUTH  TWICE DAILY Debbrah Alar, NP Taking Active Self  fluticasone (FLONASE) 50 MCG/ACT nasal spray [id number] Yes Place 2 sprays into both nostrils daily.  Patient taking differently: Place 2 sprays into both nostrils daily as needed for allergies.   Debbrah Alar, NP Taking Active Self  gabapentin (NEURONTIN) 300 MG capsule [id number] Yes TAKE 3 CAPSULES BY MOUTH 3  TIMES DAILY Debbrah Alar, NP Taking Active Self           Med Note Antony Contras, Lyne Khurana B   Fri Dec 20, 2021  1:51 PM) Taking as needed - usually 4 per day  latanoprost (XALATAN) 0.005 % ophthalmic solution [id number] Yes Place 1 drop into both eyes at bedtime.  [provider] Taking Active Self  losartan (COZAAR) 25 MG tablet [id number] Yes Take 1 tablet (25 mg total) by mouth daily. Lelon Perla, MD Taking Active Self  nystatin (MYCOSTATIN/NYSTOP) powder [id number] Yes APPLY TOPICALLY TO AFFECTED  AREA(S) 3 TIMES DAILY AS NEEDED  FOR Bennie Pierini, Lenna Sciara, NP Taking Active   ondansetron (ZOFRAN) 4 MG tablet [id number] Yes TAKE 1 TABLET BY MOUTH EVERY 4  TO 6 HOURS AS NEEDED FOR Cyndi Lennert, NP Taking Active   pantoprazole (PROTONIX) 40 MG tablet [id number] Yes TAKE 1 TABLET BY MOUTH  TWICE DAILY Milus Banister, MD Taking Active   potassium chloride SA (KLOR-CON M) 20 MEQ tablet [id number] Yes TAKE 1 TABLET BY MOUTH   DAILY Debbrah Alar, NP Taking Active   Semaglutide,0.25 or 0.5MG/DOS, (OZEMPIC, 0.25 OR 0.5 MG/DOSE,) 2 MG/3ML SOPN [id number] Yes Inject 0.5 mg into the skin once a week. Debbrah Alar, NP Taking Active   spironolactone (ALDACTONE) 25 MG tablet [id number] Yes TAKE 1 TABLET BY MOUTH  DAILY Lelon Perla, MD Taking Active Self  Tart Cherry 1200 MG CAPS [id number] No Take 1,200 mg by mouth 2 (two) times a week.  Patient not taking: Reported on 12/20/2021   [provider] Not Taking Active Self  timolol (BETIMOL) 0.5 % ophthalmic solution [id number] Yes Place 1 drop into both eyes every morning.  [provider] Taking Active Self  torsemide (DEMADEX) 20 MG tablet [id number] Yes Take 1 tablet (20 mg total) by mouth daily. TAKE 2 TABLETS BY MOUTH  ALTERNATING WITH 1 TABLET  EVERY OTHER DAY Stanford Breed Denice Bors, MD Taking Active             Patient Active Problem List   Diagnosis Date Noted   Intertrigo 12/11/2021   Chronic cough 10/17/2021   Pressure injury of right thigh, stage 2 ([place]) 09/09/2021   Neck pain 07/22/2021   Controlled type 2 diabetes mellitus without complication, without long-term current use of insulin ([place]) 05/07/2021   Bunion 02/18/2021   Gout    LBBB (left bundle branch block) 02/14/2020   Unilateral primary osteoarthritis, left knee 09/09/2017   Chronic pain syndrome 07/31/2017   Cervical myelopathy ([place])    Debility 08/01/2016   HTN (hypertension) 02/25/2016   Arthritis 01/20/2016   Overactive bladder 06/23/2015   GERD (gastroesophageal reflux disease) 06/23/2015   Chronic combined systolic and diastolic heart failure ([place]) 05/15/2015   Migraines 03/23/2015   Allergic rhinitis 06/01/2013   NICM (nonischemic cardiomyopathy) ([place]) 06/01/2013   Obstructive sleep apnea 04/08/2013   FH: colon cancer 04/08/2013  Morbid obesity due to excess calories ([place]) 04/08/2013    Immunization History  Administered Date(s) Administered    Influenza,inj,Quad PF,6+ Mos 04/08/2013, 04/01/2016, 04/27/2017, 08/27/2018, 04/08/2019, 02/15/2021   PFIZER(Purple Top)SARS-COV-2 Vaccination 09/03/2019, 09/15/2019, 06/29/2020   Tdap 03/23/2015    Conditions to be addressed/monitored: CHF, HTN, HLD, DMII, and Gout  Care Plan : General Pharmacy (Adult)  Updates made by Cherre Robins, RPH-CPP since 12/25/2021 12:00 AM     Problem: Chronic Conditions: type 2 diabetes; Congestive heart failure; cardiomyopathy; gout; acid reflux; arthritis; allergic rhinitis; hypertension   Priority: High  Onset Date: 12/20/2021     Long-Range Goal: Provide education, support and care coordination for medication therapy and chronic conditions   Start Date: 12/20/2021  Priority: High  Note:   Current Barriers:  Unable to independently afford treatment regimen Unable to independently monitor therapeutic efficacy Does not adhere to prescribed medication regimen  Pharmacist Clinical Goal(s):  Over the next 90 days, patient will verbalize ability to afford treatment regimen achieve adherence to monitoring guidelines and medication adherence to achieve therapeutic efficacy maintain control of type 2 diabetes and hypertension  as evidenced by A1c < 7.0% and blood pressure < 140/90  through collaboration with PharmD and provider.   Interventions: 1:1 collaboration with Debbrah Alar, NP regarding development and update of comprehensive plan of care as evidenced by provider attestation and co-signature Inter-disciplinary care team collaboration (see longitudinal plan of care) Comprehensive medication review performed; medication list updated in electronic medical record  Diabetes: Controlled; A1c goal < 7.0% Current treatment: Jardiance 41m daily  Ozempic 0.512minjected subcutaneously once weekly (dose increased 12/11/2021) Current glucose readings: 80 to 140 Denies hypoglycemic/hyperglycemic symptoms Patient is in Medicare Coverage gap and cost of  both Jardiance ( ~ $165/month) and Ozempic (~ $250/month) have increased.  She saw PCP 12/11/2021 who instructed her to complete current supply of Jardiance (patient states she has about 3 tablets left) and increase Ozempic to 0.19m71meekly. Patient was provided with 2 pen samples of Ozempic BMI - 44.71; Weight 277lbs.  Interventions:  Reviewed home blood glucose readings and reviewed goals  Fasting blood glucose goal (before meals) = 80 to 130 Blood glucose goal after a meal = less than 180  Assessed patient finances and screened for manufacturer medication assistance programs eligibility and for disease state assistance programs. Patient's husband currently has full time job and household income is > $100,000 per year making her ineligible for assistance programs at this time.  A short-term solution might be to change to [place] provide patient with coupon to get 30 days free. I would like to see her continue on an SGLT2 due to diagnosis of CHF / cardiomyopathy.  Our office does not keep samples ofr SGLT2s but suggested patient check with her cardiology office who might be able to offer samples of either Jardiance or [place] her PCP regarding trial of Farxiga in place of Jardiance. If she does continue SGLT2, then could lower dose of Ozempic to 0.219m76mekly and the sample pens she was given at last visit will last about 3 months.   Hypertension / Congestive Heart Failure / Cardiomyopathy: Blood pressure not at goal but CHF Controlled; Goal: blood pressure < 140/90 and to minimize symptoms of CHF and prevent hospitalization Current treatment: Jardiance 10mg19mly  Torsemide 20mg 93my  Spironolactone 219mg d31m  Carvedilol 12.19mg twi319ma day Losartan 219mg dai59motassium 20 mEq daily  Denies hypotensive/hypertensive symptoms Not checking weight at home - patient states she  is unable to stand on scales.  Interventions:  Educated on  signs and symptoms of CHF  exacerbation - weight gain, SOB, abdominal fullness, swelling in legs or abdomen, Fatigue and weakness, changes in ability to perform usual activities, persistent cough or wheezing with white or pink blood-tinged mucus, nausea and lack of appetite Patient has question about why she was taking 2 fluid pills. Counseled on benefits of taking both torsemide and spironolactone for CHF.   CVD Risk Recuction / Hyperlipidemia: No current statin therapy. LDL is less than 100 but would like to see < 70.  Current treatment: none Medications previously tried: none  Interventions:  Recommend statin therapy due to type 2 DM. Will discuss with PCP about trial of either rosuvastatin 43m or atorvastatin 248mdaily for CVD risk reduction.  Depression/Anxiety: Uncontrolled Patient mentions today that she has had a lot of anxiety surrounding her grandson's mental health and legal troubles / arrests.     12/20/2021    1:41 PM 12/02/2019    1:53 PM 04/27/2017    5:16 PM  PHQ9 SCORE ONLY  PHQ-9 Total Score 12 0 5  Current treatment:none  She is not currently connected with mental health professional and I don't thinks she has mentioned her anxiety / depression to her PCP yet.  Intervention:  Provided phone number for [facility] for patient to provide for her grandson. He can present to the clinic at anytime for assessment and treatment.  Coordinating with PCP regarding patient possibly starting SSRI to help with depression and anxiety. Will likely make appointment for the to see PCP to discuss further.   Gout:  Fairly well controlled. Patient reports about 3 acute gout episodes per year Current treatment:  Allopurinol 10051maily as needed per patient Tart Cherry supplement daily (stopped taking recently) Interventions:  Recommended patient take allopurinol 100m35mERY daily to prevent gout  She can restart TartZannie Coveshe chooses. It has been shown to help prevent gout and is thought  to be safe to use.  Provided diet information regarding prevention of gout with food to avoid / limit.  Patient Goals/Self-Care Activities Over the next 90 days, patient will:  take medications as prescribed  Restart allopurinol - take 1 tablet every day for gout prevention check glucose daily, document, and provide at future appointments collaborate with provider on medication access solutions. I am checking with your primary care provider about possible options for Jardiance.  Consider checking with your cardiologist to see if they might have samples for Jardiance.  Review diet information regarding prevention of gout - try to avoid / limit foods that might increase gout episodes. Monitor for signs and symptoms of Heart Failure exacerbation - weight gain, SOB, abdominal fullness, swelling in legs or abdomen, Fatigue and weakness, changes in ability to perform usual activities, persistent cough or wheezing with white or pink blood-tinged mucus, nausea and lack of appetite. Contact office immediately if you notice any of these symptoms.   Follow Up Plan: Telephone follow up appointment with care management team member scheduled for:  2 to 3 weeks.          Medication Assistance:  Screened for medication assistance program today - household income above limit for medication assistance program and also for disease state funds.   Patient's preferred pharmacy is:  [facility] [place]: [phone]: [phone]  [id number] - [place], [place] AT [address] [place] [address] Phone: [phone] Fax: [phone]  [facility] ([facility] ) - [place], [address] [place] [address] Phone: [phone] Fax: [phone]   Follow Up:   Patient agrees to Care Plan and Follow-up.  Plan: Telephone follow up appointment with care management team member scheduled for:  2 to 3 weeks.   Cherre Robins, PharmD Clinical Pharmacist [facility]

## 2021-12-25 ENCOUNTER — Telehealth: Payer: Self-pay | Admitting: Cardiology

## 2021-12-25 NOTE — Telephone Encounter (Signed)
Patient states she needs jardiance samples. Three weeks worth of jardiance 10mg  samples set aside for patient.

## 2021-12-25 NOTE — Telephone Encounter (Signed)
Pt c/o medication issue:  1. Name of Medication: empagliflozin (JARDIANCE) 10 MG TABS tablet  2. How are you currently taking this medication (dosage and times per day)? Take 1 tablet (10 mg total) by mouth daily before breakfast.  3. Are you having a reaction (difficulty breathing--STAT)? no  4. What is your medication issue? Patient calling to get help with the medication. She is unable to afford the medication. Please advise

## 2021-12-26 MED ORDER — DAPAGLIFLOZIN PROPANEDIOL 10 MG PO TABS: 10.0000 mg | ORAL_TABLET | Freq: Every day | ORAL | 0 refills | Status: DC

## 2021-12-26 MED ORDER — ROSUVASTATIN CALCIUM 10 MG PO TABS: 10.0000 mg | ORAL_TABLET | Freq: Every day | ORAL | 3 refills | Status: DC

## 2021-12-26 NOTE — Chronic Care Management (AMB) (Signed)
Provider approved Farxiga change from [place] and addition of rosuvastatin 10mg  daily.  Virtual appointment set up with PCP for 0717/2023 to discuss depression / anxiety.

## 2021-12-26 NOTE — Addendum Note (Signed)
Addended by: Henrene Pastor B on: 12/26/2021 04:48 PM   Modules accepted: Orders

## 2021-12-30 ENCOUNTER — Telehealth (INDEPENDENT_AMBULATORY_CARE_PROVIDER_SITE_OTHER): Payer: Medicare Other | Admitting: Family

## 2021-12-30 DIAGNOSIS — G4700 Insomnia, unspecified: Secondary | ICD-10-CM

## 2021-12-30 DIAGNOSIS — F329 Major depressive disorder, single episode, unspecified: Secondary | ICD-10-CM

## 2021-12-30 MED ORDER — TRAZODONE HCL 50 MG PO TABS: ORAL_TABLET | ORAL | 0 refills | Status: DC

## 2021-12-30 NOTE — Assessment & Plan Note (Signed)
I gave her some numbers to call to get set up to begin counseling. Support provided.  If symptoms worsen or fail to improve, could consider addition of medication.  For now though, I think that counseling would be the best place for her to start. Pt verbalizes understanding and is agreeable to plan.

## 2021-12-30 NOTE — Assessment & Plan Note (Signed)
New. Will give trial of prn trazodone.

## 2021-12-30 NOTE — Progress Notes (Signed)
Virtual telephone visit    Virtual Visit via Telephone Note   This visit type was conducted due to national recommendations for restrictions regarding the COVID-19 Pandemic (e.g. social distancing) in an effort to limit this patient's exposure and mitigate transmission in our community. Due to her co-morbid illnesses, this patient is at least at moderate risk for complications without adequate follow up. This format is felt to be most appropriate for this patient at this time. The patient did not have access to video technology or had technical difficulties with video requiring transitioning to audio format only (telephone). Physical exam was limited to content and character of the telephone converstion. CMA was able to get the patient set up on a telephone visit.   Patient location: Home. Patient and provider in visit Provider location: Office  I discussed the limitations of evaluation and management by telemedicine and the availability of in person appointments. The patient expressed understanding and agreed to proceed.   Visit Date: 12/30/2021  Today's healthcare provider: Nance Pear, NP     Subjective:    Patient ID: Briana Miller, female    DOB: 11/26/1955, 66 y.o.   MRN: [id number]  No chief complaint on file.   HPI  Reports that her grandson has been getting in a lot of trouble with the law. States that he has been doing sexually inappropriate things in public such as exposing himself and he is now in prison. She reports that things got bad about 3-4 weeks ago. She does report that the fact that he is incarcerated currently gives her a sense of relief. She states that she has been tearful, worried, sleeping very poorly due to this situation.  Past Medical History:  Diagnosis Date   Acute systolic congestive heart failure ([place]) 03/01/2014   Allergy    Anemia    Arthritis    Cardiomyopathy ([place])    Cervical myelopathy ([facility])    CHF (congestive heart  failure) ([facility])    GERD (gastroesophageal reflux disease)    Gout    Heart disease    Hyperlipidemia    Hypertension    Leg pain    Overactive bladder     Past Surgical History:  Procedure Laterality Date   ANTERIOR CERVICAL DECOMP/DISCECTOMY FUSION N/A 08/03/2016   Procedure: ANTERIOR CERVICAL DECOMPRESSION/DISCECTOMY FUSION C4-5, C5-6;  Surgeon: Kristeen Miss, MD;  Location: [facility];  Service: Neurosurgery;  Laterality: N/A;   BIV ICD INSERTION CRT-D N/A 09/16/2021   Procedure: BIV ICD INSERTION CRT-D;  Surgeon: Evans Lance, MD;  Location: [facility];  Service: Cardiovascular;  Laterality: N/A;   COLONOSCOPY     COLONOSCOPY WITH PROPOFOL N/A 02/24/2019   Procedure: COLONOSCOPY WITH PROPOFOL;  Surgeon: Milus Banister, MD;  Location: [facility];  Service: Endoscopy;  Laterality: N/A;   ESOPHAGOGASTRODUODENOSCOPY (EGD) WITH PROPOFOL N/A 04/25/2021   Procedure: ESOPHAGOGASTRODUODENOSCOPY (EGD) WITH PROPOFOL;  Surgeon: Milus Banister, MD;  Location: [facility];  Service: Endoscopy;  Laterality: N/A;   KNEE ARTHROSCOPY     2007   ROTATOR CUFF REPAIR Right 03/23/13   TOTAL KNEE ARTHROPLASTY  09/22/2011   Procedure: TOTAL KNEE ARTHROPLASTY;  Surgeon: Rudean Haskell, MD;  Location: [facility];  Service: Orthopedics;  Laterality: Right;    Family History  Problem Relation Age of Onset   Cancer Mother        colon 9 and breast 35- deceased   Colon cancer Mother    Diabetes Father  living   Asthma Father    Hypertension Father    Heart attack Father 64       medical management per pt   Glaucoma Father        had eye transplant   Kidney failure Father        acute renal failure- died 50   Esophageal cancer Neg Hx    Stomach cancer Neg Hx    Rectal cancer Neg Hx     Social History   Socioeconomic History   Marital status: Married    Spouse name: Not on file   Number of children: 2   Years of education: HS   Highest education level: Not on file  Occupational  History   Occupation: Psychiatric nurse: [employer]  Tobacco Use   Smoking status: Never   Smokeless tobacco: Never  Vaping Use   Vaping Use: Never used  Substance and Sexual Activity   Alcohol use: Not Currently    Alcohol/week: 2.0 standard drinks of alcohol    Types: 2 Glasses of wine per week   Drug use: No   Sexual activity: Not on file  Other Topics Concern   Not on file  Social History Narrative   Married- 10 years   2 children- grown daughter- lives in [place]- has 2 children   Grown son lives in [place]- 4 children   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   1-2 cups caffeine daily.   Lives at home with her husband.   Social Determinants of Health   Financial Resource Strain: Medium Risk (12/20/2021)   Overall Financial Resource Strain (CARDIA)    Difficulty of Paying Living Expenses: Somewhat hard  Food Insecurity: No Food Insecurity (12/20/2021)   Hunger Vital Sign    Worried About Running Out of Food in the Last Year: Never true    Ran Out of Food in the Last Year: Never true  Transportation Needs: No Transportation Needs (12/20/2021)   PRAPARE - Hydrologist (Medical): No    Lack of Transportation (Non-Medical): No  Physical Activity: Not on file  Stress: Not on file  Social Connections: Not on file  Intimate Partner Violence: Not on file    Outpatient Medications Prior to Visit  Medication Sig Dispense Refill   ACCU-CHEK GUIDE test strip USE UP TO 4 TIMES DAILY 400 strip 3   Accu-Chek Softclix Lancets lancets USE UP TO 4 TIMES DAILY 400 each 3   acetaminophen (TYLENOL) 500 MG tablet Take 1,000 mg by mouth every 8 (eight) hours as needed for moderate pain or headache.     albuterol (VENTOLIN HFA) 108 (90 Base) MCG/ACT inhaler Inhale 2 puffs into the lungs every 6 (six) hours as needed for wheezing or shortness of breath. 8 g  6   allopurinol (ZYLOPRIM) 100 MG tablet TAKE 1 TABLET BY MOUTH  DAILY (Patient taking differently: Take 100 mg by mouth daily as needed (gout).) 90 tablet 3   Blood Glucose Monitoring Suppl (ACCU-CHEK GUIDE) w/Device KIT USE AS DIRECTED 1 kit 0   carvedilol (COREG) 12.5 MG tablet TAKE 1 TABLET BY MOUTH  TWICE DAILY 180 tablet 4   celecoxib (CELEBREX) 100 MG capsule Take 100 mg by mouth daily as needed for pain.     cholecalciferol (VITAMIN D3) 25 MCG (1000 UNIT) tablet Take 1,000 Units by  mouth daily.     dapagliflozin propanediol (FARXIGA) 10 MG TABS tablet Take 1 tablet (10 mg total) by mouth daily before breakfast. 30 tablet 0   famotidine (PEPCID) 40 MG tablet TAKE 1 TABLET BY MOUTH  TWICE DAILY 180 tablet 3   fluticasone (FLONASE) 50 MCG/ACT nasal spray Place 2 sprays into both nostrils daily. (Patient taking differently: Place 2 sprays into both nostrils daily as needed for allergies.) 16 g 1   gabapentin (NEURONTIN) 300 MG capsule TAKE 3 CAPSULES BY MOUTH 3  TIMES DAILY 810 capsule 3   latanoprost (XALATAN) 0.005 % ophthalmic solution Place 1 drop into both eyes at bedtime.   11   losartan (COZAAR) 25 MG tablet Take 1 tablet (25 mg total) by mouth daily. 90 tablet 3   nystatin (MYCOSTATIN/NYSTOP) powder APPLY TOPICALLY TO AFFECTED  AREA(S) 3 TIMES DAILY AS NEEDED  FOR RASH 180 g 1   ondansetron (ZOFRAN) 4 MG tablet TAKE 1 TABLET BY MOUTH EVERY 4  TO 6 HOURS AS NEEDED FOR NAUSEA 90 tablet 0   pantoprazole (PROTONIX) 40 MG tablet TAKE 1 TABLET BY MOUTH  TWICE DAILY 180 tablet 3   potassium chloride SA (KLOR-CON M) 20 MEQ tablet TAKE 1 TABLET BY MOUTH  DAILY 90 tablet 1   rosuvastatin (CRESTOR) 10 MG tablet Take 1 tablet (10 mg total) by mouth daily. For cholesterol and heart. 90 tablet 3   Semaglutide,0.25 or 0.5MG/DOS, (OZEMPIC, 0.25 OR 0.5 MG/DOSE,) 2 MG/3ML SOPN Inject 0.5 mg into the skin once a week. 6 mL 0   spironolactone (ALDACTONE) 25 MG tablet TAKE 1 TABLET BY MOUTH  DAILY 90 tablet  3   Tart Cherry 1200 MG CAPS Take 1,200 mg by mouth 2 (two) times a week. (Patient not taking: Reported on 12/20/2021)     timolol (BETIMOL) 0.5 % ophthalmic solution Place 1 drop into both eyes every morning.      torsemide (DEMADEX) 20 MG tablet Take 1 tablet (20 mg total) by mouth daily. TAKE 2 TABLETS BY MOUTH  ALTERNATING WITH 1 TABLET  EVERY OTHER DAY 135 tablet 3   No facility-administered medications prior to visit.    Allergies  Allergen Reactions   Entresto [Sacubitril-Valsartan] Other (See Comments)    "Dry mouth"    ROS    See HPI Objective:    Physical Exam Neurological:     Mental Status: She is alert and oriented to person, place, and time.  Psychiatric:        Mood and Affect: Mood normal.        Behavior: Behavior normal.        Thought Content: Thought content normal.        Judgment: Judgment normal.     LMP 06/17/2007  Wt Readings from Last 3 Encounters:  12/11/21 277 lb (125.6 kg)  10/17/21 225 lb (102.1 kg)  09/16/21 220 lb (99.8 kg)        Assessment & Plan:   Problem List Items Addressed This Visit       Unprioritized   Reactive depression (situational)    I gave her some numbers to call to get set up to begin counseling. Support provided.  If symptoms worsen or fail to improve, could consider addition of medication.  For now though, I think that counseling would be the best place for her to start. Pt verbalizes understanding and is agreeable to plan.       Relevant Medications   traZODone (DESYREL) 50 MG tablet  Insomnia - Primary    New. Will give trial of prn trazodone.       Relevant Medications   traZODone (DESYREL) 50 MG tablet   11 minutes spent on today's phone call.  I am having Karren Burly start on traZODone. I am also having her maintain her latanoprost, timolol, spironolactone, gabapentin, allopurinol, carvedilol, celecoxib, cholecalciferol, Tart Cherry, famotidine, losartan, fluticasone, acetaminophen, torsemide,  pantoprazole, Accu-Chek Softclix Lancets, Accu-Chek Guide, potassium chloride SA, albuterol, ondansetron, Accu-Chek Guide, nystatin, Ozempic (0.25 or 0.5 MG/DOSE), dapagliflozin propanediol, and rosuvastatin.  Meds ordered this encounter  Medications   traZODone (DESYREL) 50 MG tablet    Sig: 1/2-1 tablet by mouth at bedtime as needed for sleep.    Dispense:  30 tablet    Refill:  0    Order Specific Question:   Supervising Provider    Answer:   Penni Homans A [[id number]]     I discussed the assessment and treatment plan with the patient. The patient was provided an opportunity to ask questions and all were answered. The patient agreed with the plan and demonstrated an understanding of the instructions.   The patient was advised to call back or seek an in-person evaluation if the symptoms worsen or if the condition fails to improve as anticipated.  Nance Pear, NP [employer] [facility] [phone] (phone) [phone] (fax)  [place]

## 2022-01-06 ENCOUNTER — Other Ambulatory Visit: Payer: Self-pay | Admitting: Neurology

## 2022-01-06 ENCOUNTER — Other Ambulatory Visit: Payer: Self-pay | Admitting: Family

## 2022-01-08 NOTE — Progress Notes (Signed)
Remote ICD transmission.   

## 2022-01-10 ENCOUNTER — Other Ambulatory Visit: Payer: Self-pay | Admitting: Family

## 2022-01-10 ENCOUNTER — Ambulatory Visit: Payer: Medicare Other | Admitting: Cardiology

## 2022-01-13 ENCOUNTER — Encounter: Payer: Self-pay | Admitting: *Deleted

## 2022-01-13 ENCOUNTER — Telehealth: Payer: Self-pay | Admitting: Cardiology

## 2022-01-13 DIAGNOSIS — E785 Hyperlipidemia, unspecified: Secondary | ICD-10-CM | POA: Diagnosis not present

## 2022-01-13 DIAGNOSIS — I10 Essential (primary) hypertension: Secondary | ICD-10-CM | POA: Diagnosis not present

## 2022-01-13 DIAGNOSIS — E119 Type 2 diabetes mellitus without complications: Secondary | ICD-10-CM | POA: Diagnosis not present

## 2022-01-13 NOTE — Telephone Encounter (Signed)
Spoke with pt, letter generated and sent to patient via my chart

## 2022-01-13 NOTE — Telephone Encounter (Signed)
Patient requesting a letter stating it is okay for her to travel to the [place] on a cruise.

## 2022-01-17 ENCOUNTER — Telehealth: Payer: Self-pay | Admitting: Pharmacist

## 2022-01-17 ENCOUNTER — Ambulatory Visit (INDEPENDENT_AMBULATORY_CARE_PROVIDER_SITE_OTHER): Payer: Medicare Other | Admitting: Pharmacist

## 2022-01-17 DIAGNOSIS — E785 Hyperlipidemia, unspecified: Secondary | ICD-10-CM

## 2022-01-17 DIAGNOSIS — M109 Gout, unspecified: Secondary | ICD-10-CM

## 2022-01-17 DIAGNOSIS — F329 Major depressive disorder, single episode, unspecified: Secondary | ICD-10-CM

## 2022-01-17 DIAGNOSIS — I10 Essential (primary) hypertension: Secondary | ICD-10-CM

## 2022-01-17 DIAGNOSIS — E119 Type 2 diabetes mellitus without complications: Secondary | ICD-10-CM

## 2022-01-17 MED ORDER — SCOPOLAMINE 1 MG/3DAYS TD PT72: 1.0000 | MEDICATED_PATCH | TRANSDERMAL | 0 refills | Status: DC

## 2022-01-17 NOTE — Patient Instructions (Addendum)
Briana Miller It was a pleasure speaking with you today.  Below is a summary of your health goals and our recent visit. You can also view your updated Chronic Care Management Care plan through your MyChart account.   Patient Goals/Self-Care Activities take medications as prescribed  check glucose daily, document, and provide at future appointments collaborate with provider on medication access solutions. I have provided information again to [facility] for 30 days of Farxiga for free. Please notify me if you have any issues getting this prescription.  Consider checking with your cardiologist to see if they might have samples for Jardiance.  Review diet information regarding prevention of gout - try to avoid / limit foods that might increase gout episodes. Monitor for signs and symptoms of Heart Failure exacerbation - weight gain, SOB, abdominal fullness, swelling in legs or abdomen, Fatigue and weakness, changes in ability to perform usual activities, persistent cough or wheezing with white or pink blood-tinged mucus, nausea and lack of appetite. Contact office immediately if you notice any of these symptoms.     As always if you have any questions or concerns especially regarding medications, please feel free to contact me either at the phone number below or with a MyChart message.   Keep up the good work!  Henrene Pastor, PharmD Clinical Pharmacist [facility] [phone] (direct line)  [phone] (main office number)   Patient verbalizes understanding of instructions and care plan provided today and agrees to view in MyChart. Active MyChart status and patient understanding of how to access instructions and care plan via MyChart confirmed with patient.

## 2022-01-17 NOTE — Telephone Encounter (Signed)
Patient is going on a cruise next week. She is asking if she have can have a prescription for scopolamine patch to use during cruise for motion sickness.

## 2022-01-17 NOTE — Chronic Care Management (AMB) (Signed)
Chronic Care Management Pharmacy Note  01/17/2022 Name:  Briana Miller MRN:  [id number] DOB:  08-Jan-1956  Summary: Type 2 DM and CHF: Patient is having difficulty with cost of medications. She received 3 weeks of Jardiance samples from her cardiologist 12/30/2021 but only has 4 or 5 days left. I had sent in Rx for Farxiga and 30 day free coupon but patient states when she tried to pick up from [employer] cost was $147. Coordianted with [facility] to reprocess with 30 day free card. Faxed copy of 30 day free trial to [facility]. Called back and verified cost was $0. Continue Ozempic to 0.90m weekly - has about 2 months of samples left.   Reviewed signs and symptoms of CHF exacerbation - weight gain, SOB, abdominal fullness, swelling in legs or abdomen, Fatigue and weakness, changes in ability to perform usual activities, persistent cough or wheezing with white or pink blood-tinged mucus, nausea and lack of appetite. Patient had question about why she was taking 2 fluid pills. Counseled on benefits of taking both torsemide and spironolactone for CHF.  CVD Risk Recuction / Hyperlipidemia: Patient endorses she started rosuvastatin 142mdaily for CVD risk reduction. Tolerating well.   Depression/Anxiety: patient reports improved anxiety and sleep since started trazodone 5071mShe has not seen counseolor yet.  Gout: restarted allopurinol 100m29mily after last visit. Tolerating well. No recent gout episodes.  Patient also asked about scopolamine patches to use on her upcoming cruise. Sent request to PCP.  Follow Up Plan: Telephone follow up appointment with care management team member scheduled for:  4 weeks.      Subjective: Briana Miller 66 y45. year old female who is a primary patient of O'SuDebbrah Miller.  The CCM team was consulted for assistance with disease management and care coordination needs.    Engaged with patient by telephone for follow up visit in  response to provider referral for pharmacy case management and/or care coordination services.   Consent to Services:  The patient was given information about Chronic Care Management services, agreed to services, and gave verbal consent prior to initiation of services.  Please see initial visit note for detailed documentation.   Patient Care Team: O'SuDebbrah Miller as PCP - General (Internal Medicine) CrenStanford BreedaDenice Bors as PCP - Cardiology (Cardiology) EckaCherre RobinsH-CPP (Pharmacist)  Recent office visits: 12/30/2021 - Int Med (O'SInda Castle) Video Visit. Seen for anxiety / stress. Referred for counseling and prescrived trazodone 50mg73mbedtime to help with sleep.  12/11/21-Briana O'SulInda Castle (PCP) Seen for diabetic follow up and urine oder visit.  Will increase ozempic from 0.25mg 15m.5mg on7mweekly. Samples provided. [facility]. Labs ordered. Hold Jardiance when your run out until we can look into patient assistance. Follow up in 3 months. 09/09/21-Briana O'SulliInda CastlePCP, Video visit)  Seen for a wound check. Follow up in 2 weeks. 08/26/21-Briana O'SulliInda CastlePCP, Video visit) Seen for neck pain and facial pain. 08/02/21-Briana O'SulliInda CastlePCP) Diabetic follow up visit. Labs ordered. 07/22/21-Briana O'SulliInda CastlePCP, Video visit) Seen for neck pain and headache. MRI ordered. Follow up in 2 weeks.  Recent consult visits: 10/17/21-Clinton D. Young, [facility]) General follow up visit. Follow up in 4 weeks. 09/06/21-Gregg W. Taylor,[facility]) General cardiac follow up visit. Labs ordered. 08/02/21-Follow up cardiac visit. Start Jardiance 10 mg ( Take 1 Tablet Daily). Follow  up in 2 months.  Hospital visits: None in previous 6 months  Objective:  Lab Results  Component Value Date   CREATININE 1.14 12/11/2021   CREATININE 1.56 (H) 09/06/2021   CREATININE 1.37 (H)  08/02/2021    Lab Results  Component Value Date   HGBA1C 6.4 12/11/2021   Last diabetic Eye exam: No results found for: "HMDIABEYEEXA"  Last diabetic Foot exam: No results found for: "HMDIABFOOTEX"      Component Value Date/Time   CHOL 170 12/02/2019 1434   TRIG 140 12/02/2019 1434   HDL 49 (L) 12/02/2019 1434   CHOLHDL 3.5 12/02/2019 1434   VLDL 23.8 01/21/2019 1350   LDLCALC 97 12/02/2019 1434       Latest Ref Rng & Units 02/15/2021   11:43 AM 04/08/2019    2:06 PM 01/21/2019    1:50 PM  Hepatic Function  Total Protein 6.0 - 8.3 g/dL 7.4  7.2  7.7   Albumin 3.5 - 5.2 g/dL 4.1  4.2  4.6   AST 0 - 37 U/L _0 ALT 0 - 35 U/L _1 Alk Phosphatase 39 - 117 U/L 96  87  95   Total Bilirubin 0.2 - 1.2 mg/dL 0.6  0.6  0.3   Bilirubin, Direct 0.0 - 0.3 mg/dL  0.1      Lab Results  Component Value Date/Time   TSH 1.97 02/15/2021 11:43 AM   TSH 2.954 08/02/2016 02:15 AM   TSH 1.53 03/23/2015 02:30 PM       Latest Ref Rng & Units 09/06/2021   11:05 AM 02/15/2021   11:43 AM 04/08/2019    2:06 PM  CBC  WBC 3.4 - 10.8 x10E3/uL 4.7  5.0  6.0   Hemoglobin 11.1 - 15.9 g/dL 13.1  12.1  11.9   Hematocrit 34.0 - 46.6 % 39.8  36.5  35.9   Platelets 150 - 450 x10E3/uL 270  264.0  269.0     No results found for: "VD25OH"  Clinical ASCVD: No  The 10-year ASCVD risk score (Arnett DK, et al., 2019) is: 24.7%   Values used to calculate the score:     Age: 26 years     Sex: Female     Is Non-Hispanic African American: Yes     Diabetic: Yes     Tobacco smoker: No     Systolic Blood Pressure: 947 mmHg     Is BP treated: Yes     HDL Cholesterol: 49 mg/dL     Total Cholesterol: 170 mg/dL    Other: (CHADS2VASc if Afib, PHQ9 if depression, MMRC or CAT for COPD, ACT, DEXA)  Social History   Tobacco Use  Smoking Status Never  Smokeless Tobacco Never   BP Readings from Last 3 Encounters:  12/11/21 (!) 149/84  10/17/21 122/80  09/16/21 110/69   Pulse Readings from  Last 3 Encounters:  12/11/21 80  10/17/21 80  09/16/21 82   Wt Readings from Last 3 Encounters:  12/11/21 277 lb (125.6 kg)  10/17/21 225 lb (102.1 kg)  09/16/21 220 lb (99.8 kg)    Assessment: Review of patient past medical history, allergies, medications, health status, including review of consultants reports, laboratory and other test data, was performed as part of comprehensive evaluation and provision of chronic care management services.   SDOH:  (Social Determinants of Health) assessments and interventions performed:  SDOH Interventions    Flowsheet Row Most Recent Value  SDOH Interventions   Stress Interventions Intervention Not Indicated  Depression Interventions/Treatment  Currently on Treatment       CCM Care Plan  Allergies  Allergen Reactions   Entresto [Sacubitril-Valsartan] Other (See Comments)    "Dry mouth"    Medications Reviewed Today     Reviewed by Cherre Robins, RPH-CPP (Pharmacist) on 12/20/21 at 1355  Med List Status: <None>   Medication Order Taking? Sig Documenting Provider Last Dose Status Informant  ACCU-CHEK GUIDE test strip [id number] Yes USE UP TO 4 TIMES DAILY Debbrah Alar, NP Taking Active   Accu-Chek Softclix Lancets lancets [id number] Yes USE UP TO 4 TIMES DAILY Debbrah Alar, NP Taking Active   acetaminophen (TYLENOL) 500 MG tablet [id number] Yes Take 1,000 mg by mouth every 8 (eight) hours as needed for moderate pain or headache. [provider] Taking Active Self  albuterol (VENTOLIN HFA) 108 (90 Base) MCG/ACT inhaler [id number] Yes Inhale 2 puffs into the lungs every 6 (six) hours as needed for wheezing or shortness of breath. Baird Lyons D, MD Taking Active   allopurinol (ZYLOPRIM) 100 MG tablet [id number] Yes TAKE 1 TABLET BY MOUTH  DAILY  Patient taking differently: Take 100 mg by mouth daily as needed (gout).   Debbrah Alar, NP Taking Active Self  Blood Glucose Monitoring Suppl (ACCU-CHEK GUIDE) w/Device  KIT [id number] Yes USE AS DIRECTED Debbrah Alar, NP Taking Active   carvedilol (COREG) 12.5 MG tablet [id number] Yes TAKE 1 TABLET BY MOUTH  TWICE DAILY Croitoru, Mihai, MD Taking Active Self  celecoxib (CELEBREX) 100 MG capsule [id number] Yes Take 100 mg by mouth daily as needed for pain. [provider] Taking Active Self  cholecalciferol (VITAMIN D3) 25 MCG (1000 UNIT) tablet [id number] Yes Take 1,000 Units by mouth daily. [provider] Taking Active Self  empagliflozin (JARDIANCE) 10 MG TABS tablet [id number] Yes Take 1 tablet (10 mg total) by mouth daily before breakfast. Lenna Sciara, NP Taking Active Self  famotidine (PEPCID) 40 MG tablet [id number] Yes TAKE 1 TABLET BY MOUTH  TWICE DAILY Debbrah Alar, NP Taking Active Self  fluticasone (FLONASE) 50 MCG/ACT nasal spray [id number] Yes Place 2 sprays into both nostrils daily.  Patient taking differently: Place 2 sprays into both nostrils daily as needed for allergies.   Debbrah Alar, NP Taking Active Self  gabapentin (NEURONTIN) 300 MG capsule [id number] Yes TAKE 3 CAPSULES BY MOUTH 3  TIMES DAILY Debbrah Alar, NP Taking Active Self           Med Note Antony Contras, Dashae Wilcher B   Fri Dec 20, 2021  1:51 PM) Taking as needed - usually 4 per day  latanoprost (XALATAN) 0.005 % ophthalmic solution [id number] Yes Place 1 drop into both eyes at bedtime.  [provider] Taking Active Self  losartan (COZAAR) 25 MG tablet [id number] Yes Take 1 tablet (25 mg total) by mouth daily. Lelon Perla, MD Taking Active Self  nystatin (MYCOSTATIN/NYSTOP) powder [id number] Yes APPLY TOPICALLY TO AFFECTED  AREA(S) 3 TIMES DAILY AS NEEDED  FOR Bennie Pierini, Lenna Sciara, NP Taking Active   ondansetron (ZOFRAN) 4 MG tablet [id number] Yes TAKE 1 TABLET BY MOUTH EVERY 4  TO 6 HOURS AS NEEDED FOR Cyndi Lennert, NP Taking Active   pantoprazole (PROTONIX) 40 MG tablet [id number] Yes TAKE 1 TABLET BY MOUTH  TWICE DAILY  Milus Banister, MD Taking Active   potassium chloride SA (KLOR-CON M) 20 MEQ tablet [id number] Yes TAKE 1 TABLET BY MOUTH  DAILY Debbrah Alar, NP  Taking Active   Semaglutide,0.25 or 0.5MG /DOS, (OZEMPIC, 0.25 OR 0.5 MG/DOSE,) 2 MG/3ML SOPN [id number] Yes Inject 0.5 mg into the skin once a week. Debbrah Alar, NP Taking Active   spironolactone (ALDACTONE) 25 MG tablet [id number] Yes TAKE 1 TABLET BY MOUTH  DAILY Lelon Perla, MD Taking Active Self  Tart Cherry 1200 MG CAPS [id number] No Take 1,200 mg by mouth 2 (two) times a week.  Patient not taking: Reported on 12/20/2021   [provider] Not Taking Active Self  timolol (BETIMOL) 0.5 % ophthalmic solution [id number] Yes Place 1 drop into both eyes every morning.  [provider] Taking Active Self  torsemide (DEMADEX) 20 MG tablet [id number] Yes Take 1 tablet (20 mg total) by mouth daily. TAKE 2 TABLETS BY MOUTH  ALTERNATING WITH 1 TABLET  EVERY OTHER DAY Stanford Breed Denice Bors, MD Taking Active             Patient Active Problem List   Diagnosis Date Noted   Reactive depression (situational) 12/30/2021   Insomnia 12/30/2021   Intertrigo 12/11/2021   Chronic cough 10/17/2021   Pressure injury of right thigh, stage 2 ([place]) 09/09/2021   Neck pain 07/22/2021   Controlled type 2 diabetes mellitus without complication, without long-term current use of insulin ([place]) 05/07/2021   Bunion 02/18/2021   Gout    LBBB (left bundle branch block) 02/14/2020   Unilateral primary osteoarthritis, left knee 09/09/2017   Chronic pain syndrome 07/31/2017   Cervical myelopathy ([place])    Debility 08/01/2016   HTN (hypertension) 02/25/2016   Arthritis 01/20/2016   Overactive bladder 06/23/2015   GERD (gastroesophageal reflux disease) 06/23/2015   Chronic combined systolic and diastolic heart failure ([place]) 05/15/2015   Migraines 03/23/2015   Allergic rhinitis 06/01/2013   NICM (nonischemic cardiomyopathy) ([place]) 06/01/2013    Obstructive sleep apnea 04/08/2013   FH: colon cancer 04/08/2013   Morbid obesity due to excess calories ([place]) 04/08/2013    Immunization History  Administered Date(s) Administered   Influenza,inj,Quad PF,6+ Mos 04/08/2013, 04/01/2016, 04/27/2017, 08/27/2018, 04/08/2019, 02/15/2021   PFIZER(Purple Top)SARS-COV-2 Vaccination 09/03/2019, 09/15/2019, 06/29/2020   Tdap 03/23/2015    Conditions to be addressed/monitored: CHF, HTN, HLD, DMII, and Gout  Care Plan : General Pharmacy (Adult)  Updates made by Cherre Robins, RPH-CPP since 01/17/2022 12:00 AM     Problem: Chronic Conditions: type 2 diabetes; Congestive heart failure; cardiomyopathy; gout; acid reflux; arthritis; allergic rhinitis; hypertension   Priority: High  Onset Date: 12/20/2021     Long-Range Goal: Provide education, support and care coordination for medication therapy and chronic conditions   Start Date: 12/20/2021  Priority: High  Note:   Current Barriers:  Unable to independently afford treatment regimen Unable to independently monitor therapeutic efficacy Does not adhere to prescribed medication regimen  Pharmacist Clinical Goal(s):  Over the next 90 days, patient will verbalize ability to afford treatment regimen achieve adherence to monitoring guidelines and medication adherence to achieve therapeutic efficacy maintain control of type 2 diabetes and hypertension  as evidenced by A1c < 7.0% and blood pressure < 140/90  through collaboration with PharmD and provider.   Interventions: 1:1 collaboration with Debbrah Alar, NP regarding development and update of comprehensive plan of care as evidenced by provider attestation and co-signature Inter-disciplinary care team collaboration (see longitudinal plan of care) Comprehensive medication review performed; medication list updated in electronic medical record  Diabetes: Controlled; A1c goal < 7.0% Current treatment: Jardiance 10mg  daily  Ozempic 0.5mg   injected subcutaneously once weekly (dose  increased 12/11/2021) Current glucose readings: 80 to 140 Denies hypoglycemic/hyperglycemic symptoms Patient is in Medicare Coverage gap and cost of both Jardiance ( ~ $165/month) and Ozempic (~ $250/month) have increased.  She saw PCP 12/11/2021 who instructed her to complete current supply of Jardiance (patient states she has about 3 tablets left) and increase Ozempic to 0.35m weekly. Patient was provided with 2 pen samples of Ozempic 01/17/2022: Rx for FWilder Gladewas sent to pharmacy in July but they did not use 30 days free card. Per patient cost was $147. Patient has 4 or 5 days of Jardiance left from samples provided by Dr CStanford Breed07/17/2023. BMI - 44.71; Weight 277lbs.  Interventions:  Reviewed home blood glucose readings and reviewed goals  Fasting blood glucose goal (before meals) = 80 to 130 Blood glucose goal after a meal = less than 180  Assessed patient finances and screened for manufacturer medication assistance programs eligibility and for disease state assistance programs. Patient's husband currently has full time job and household income is > $100,000 per year making her ineligible for assistance programs at this time.  [facility]ed [facility] and they reprocessed FIranwith 30 day free coupon card. Will start farxiga 144mafter she completed Jardiance 1025mamples.  Continue Ozempic    Hypertension / Congestive Heart Failure / Cardiomyopathy: Blood pressure not at goal but CHF Controlled; Goal: blood pressure < 140/90 and to minimize symptoms of CHF and prevent hospitalization Current treatment: Jardiance 64m75mily  Torsemide 20mg90mly  Spironolactone 25mg 89my  Carvedilol 12.5mg tw25m a day Losartan 25mg da96mPotassium 20 mEq daily  Denies hypotensive/hypertensive symptoms Not checking weight at home - patient states she is unable to stand on scales.  Interventions:  Educated on  signs and symptoms of CHF exacerbation - weight gain,  SOB, abdominal fullness, swelling in legs or abdomen, Fatigue and weakness, changes in ability to perform usual activities, persistent cough or wheezing with white or pink blood-tinged mucus, nausea and lack of appetite Continue current medications. Consider increasing losartan to 50mg dai53mf blood pressure remains above 140/90  CVD Risk Recuction / Hyperlipidemia: No current statin therapy. LDL is less than 100 but would like to see < 70.  Current treatment:  Rosuvastatin 64mg dail51mdications previously tried: none  Interventions:  Continue rosuvastatin 64mg daily69mepression/Anxiety: Improving since started trazodone. Patient mentions today that she has had a lot of anxiety surrounding her grandson's mental health and legal troubles. He is currently in prison but is getting psychiatric evaluation and assistance which is she grateful for.      01/17/2022   11:31 AM 12/20/2021    1:41 PM 12/02/2019    1:53 PM  PHQ9 SCORE ONLY  PHQ-9 Total Score 5 12 0  Current treatment: Trazodone 50mg - take47m to 1 tablet at bedtime if needed for sleep (Started 12/30/2021) She is not currently connected with mental health professional / counselor Intervention:  Continue trazodone as needed for sleep  Gout:  Improved since restarted taking allopurinol daily. Patient reports about 3 acute gout episodes per year Current treatment:  Allopurinol 100mg daily  65m Cherry supplement daily (stopped taking recently) Interventions:  Continue allopurinol 100mg EVERY da17mto prevent gout  She can restart Tart Cherry ifZannie Coves. It has been shown to help prevent gout and is thought to be safe to use.  Provided diet information regarding prevention of gout with food to avoid / limit.(Addressed at previous visit )   Patient Goals/Self-Care Activities Over the  next 90 days, patient will:  take medications as prescribed  check glucose daily, document, and provide at future appointments collaborate  with provider on medication access solutions. I have provided information again to [facility] for 30 days of Farxiga for free. Please notify me if you have any issues getting this prescription.  Consider checking with your cardiologist to see if they might have samples for Jardiance.  Review diet information regarding prevention of gout - try to avoid / limit foods that might increase gout episodes. Monitor for signs and symptoms of Heart Failure exacerbation - weight gain, SOB, abdominal fullness, swelling in legs or abdomen, Fatigue and weakness, changes in ability to perform usual activities, persistent cough or wheezing with white or pink blood-tinged mucus, nausea and lack of appetite. Contact office immediately if you notice any of these symptoms.   Follow Up Plan: Telephone follow up appointment with care management team member scheduled for:  4 weeks.          Medication Assistance:  Screened for medication assistance program today - household income above limit for medication assistance program and also for disease state funds.   Patient's preferred pharmacy is:  [employer] [facility]) - [place], [place] [facility] [address] [place] [address] [place] [address] Phone: [phone] Fax: [id number] - [place], [place] [address] Phone: [phone] Fax: [phone]  [facility] ([facility] ) - [place], [place] [address] [place] [address] Phone: [phone] Fax: [phone]   Follow Up:  Patient agrees to Care Plan and Follow-up.  Plan: Telephone follow up appointment with care management team member scheduled for:  4 weeks.   Cherre Robins, PharmD Clinical Pharmacist [facility]

## 2022-01-17 NOTE — Addendum Note (Signed)
Addended by: Sandford Craze on: 01/17/2022 01:06 PM   Modules accepted: Orders

## 2022-01-23 ENCOUNTER — Telehealth: Payer: Self-pay | Admitting: Cardiology

## 2022-01-23 NOTE — Telephone Encounter (Signed)
Spoke to patient . She states primary switch her to [place] 10 mg  due the cost factor.  Patient states she has not started medication  yet because she is going out of town for 10 days and was not sure how she would react to the new medication .  She has been taking Jardiance 10 mg until her return. She states she only has one pil left.  RN informed patient  will be apply to give a 2 week supply. No more samples after this time. She will need to start Fargixa or contact her primary  She voiced understanding.  Lot [id number] Exp Oct 2025  x 2

## 2022-01-23 NOTE — Telephone Encounter (Signed)
Patient calling the office for samples of medication:   1.  What medication and dosage are you requesting samples for? Jardiance 10mg  2.  Are you currently out of this medication? yes   

## 2022-02-13 DIAGNOSIS — I110 Hypertensive heart disease with heart failure: Secondary | ICD-10-CM

## 2022-02-13 DIAGNOSIS — I509 Heart failure, unspecified: Secondary | ICD-10-CM | POA: Diagnosis not present

## 2022-02-13 DIAGNOSIS — E785 Hyperlipidemia, unspecified: Secondary | ICD-10-CM | POA: Diagnosis not present

## 2022-02-13 DIAGNOSIS — E1159 Type 2 diabetes mellitus with other circulatory complications: Secondary | ICD-10-CM | POA: Diagnosis not present

## 2022-02-13 DIAGNOSIS — Z7985 Long-term (current) use of injectable non-insulin antidiabetic drugs: Secondary | ICD-10-CM

## 2022-02-13 DIAGNOSIS — F329 Major depressive disorder, single episode, unspecified: Secondary | ICD-10-CM

## 2022-02-18 ENCOUNTER — Telehealth (INDEPENDENT_AMBULATORY_CARE_PROVIDER_SITE_OTHER): Payer: Medicare Other | Admitting: Family

## 2022-02-18 DIAGNOSIS — R3915 Urgency of urination: Secondary | ICD-10-CM | POA: Diagnosis not present

## 2022-02-18 MED ORDER — BENZONATATE 100 MG PO CAPS: 100.0000 mg | ORAL_CAPSULE | Freq: Three times a day (TID) | ORAL | 0 refills | Status: DC | PRN

## 2022-02-18 MED ORDER — CEPHALEXIN 500 MG PO CAPS: 500.0000 mg | ORAL_CAPSULE | Freq: Four times a day (QID) | ORAL | 0 refills | Status: DC

## 2022-02-18 NOTE — Assessment & Plan Note (Addendum)
New post void incontinence. Recommended that she begin empiric keflex. If symptoms worsen or if symptoms fail to improve, recommend that she schedule in office visit for culture.

## 2022-02-18 NOTE — Progress Notes (Signed)
MyChart Video Visit    Virtual Visit via Video Note   This visit type was conducted due to national recommendations for restrictions regarding the COVID-19 Pandemic (e.g. social distancing) in an effort to limit this patient's exposure and mitigate transmission in our community. This patient is at least at moderate risk for complications without adequate follow up. This format is felt to be most appropriate for this patient at this time. Physical exam was limited by quality of the video and audio technology used for the visit. CMA was able to get the patient set up on a video visit.  Patient location: Home Patient and provider in visit Provider location: Office  I discussed the limitations of evaluation and management by telemedicine and the availability of in person appointments. The patient expressed understanding and agreed to proceed.  Visit Date: 02/18/2022  Today's healthcare provider: Nance Pear, NP     Subjective:    Patient ID: Briana Miller, female    DOB: 03-Dec-1955, 66 y.o.   MRN: [id number]  Chief Complaint  Patient presents with   Urinary Frequency    Patient complains of daytime urinary frequency. Started about 3 weeks ago.     Urinary Frequency  Associated symptoms include frequency.   Patient is in today for a virtual office visit.  Frequency: She complains of frequent urination and incontinence. Symptoms appeared about three weeks ago. When she does use the restroom, she notices a gush of urination soon after. She denies of any fever or dysuria.  Cough: She is requesting a medication of Tessalon for her coughing. She reports intermittent mild cough which is relieved by tessalon. Sleep Study: She has an appointment with her pulmonologist on 02/21/2022.  Past Medical History:  Diagnosis Date   Acute systolic congestive heart failure ([facility]) 03/01/2014   Allergy    Anemia    Arthritis    Cardiomyopathy ([facility])    Cervical myelopathy ([facility])     CHF (congestive heart failure) ([facility])    GERD (gastroesophageal reflux disease)    Gout    Heart disease    Hyperlipidemia    Hypertension    Leg pain    Overactive bladder     Past Surgical History:  Procedure Laterality Date   ANTERIOR CERVICAL DECOMP/DISCECTOMY FUSION N/A 08/03/2016   Procedure: ANTERIOR CERVICAL DECOMPRESSION/DISCECTOMY FUSION C4-5, C5-6;  Surgeon: Kristeen Miss, MD;  Location: [facility];  Service: Neurosurgery;  Laterality: N/A;   BIV ICD INSERTION CRT-D N/A 09/16/2021   Procedure: BIV ICD INSERTION CRT-D;  Surgeon: Evans Lance, MD;  Location: [facility];  Service: Cardiovascular;  Laterality: N/A;   COLONOSCOPY     COLONOSCOPY WITH PROPOFOL N/A 02/24/2019   Procedure: COLONOSCOPY WITH PROPOFOL;  Surgeon: Milus Banister, MD;  Location: [facility];  Service: Endoscopy;  Laterality: N/A;   ESOPHAGOGASTRODUODENOSCOPY (EGD) WITH PROPOFOL N/A 04/25/2021   Procedure: ESOPHAGOGASTRODUODENOSCOPY (EGD) WITH PROPOFOL;  Surgeon: Milus Banister, MD;  Location: [facility];  Service: Endoscopy;  Laterality: N/A;   KNEE ARTHROSCOPY     2007   ROTATOR CUFF REPAIR Right 03/23/13   TOTAL KNEE ARTHROPLASTY  09/22/2011   Procedure: TOTAL KNEE ARTHROPLASTY;  Surgeon: Rudean Haskell, MD;  Location: [facility];  Service: Orthopedics;  Laterality: Right;    Family History  Problem Relation Age of Onset   Cancer Mother        colon 78 and breast 9- deceased   Colon cancer Mother    Diabetes Father  living   Asthma Father    Hypertension Father    Heart attack Father 55       medical management per pt   Glaucoma Father        had eye transplant   Kidney failure Father        acute renal failure- died 54   Esophageal cancer Neg Hx    Stomach cancer Neg Hx    Rectal cancer Neg Hx     Social History   Socioeconomic History   Marital status: Married    Spouse name: Not on file   Number of children: 2   Years of education: HS   Highest education level: Not  on file  Occupational History   Occupation: Psychiatric nurse: [employer]  Tobacco Use   Smoking status: Never   Smokeless tobacco: Never  Vaping Use   Vaping Use: Never used  Substance and Sexual Activity   Alcohol use: Not Currently    Alcohol/week: 2.0 standard drinks of alcohol    Types: 2 Glasses of wine per week   Drug use: No   Sexual activity: Not on file  Other Topics Concern   Not on file  Social History Narrative   Married- 26 years   2 children- grown daughter- lives in [place]- has 2 children   Grown son lives in [place]- 4 children   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   1-2 cups caffeine daily.   Lives at home with her husband.   Social Determinants of Health   Financial Resource Strain: Medium Risk (12/20/2021)   Overall Financial Resource Strain (CARDIA)    Difficulty of Paying Living Expenses: Somewhat hard  Food Insecurity: No Food Insecurity (12/20/2021)   Hunger Vital Sign    Worried About Running Out of Food in the Last Year: Never true    Ran Out of Food in the Last Year: Never true  Transportation Needs: No Transportation Needs (12/20/2021)   PRAPARE - Hydrologist (Medical): No    Lack of Transportation (Non-Medical): No  Physical Activity: Not on file  Stress: No Stress Concern Present (01/17/2022)   [place]    Feeling of Stress : Only a little  Social Connections: Not on file  Intimate Partner Violence: Not on file    Outpatient Medications Prior to Visit  Medication Sig Dispense Refill   ACCU-CHEK GUIDE test strip USE UP TO 4 TIMES DAILY 400 strip 3   Accu-Chek Softclix Lancets lancets USE UP TO 4 TIMES DAILY 400 each 3   acetaminophen (TYLENOL) 500 MG tablet Take 1,000 mg by mouth every 8 (eight) hours as needed for moderate pain or  headache.     albuterol (VENTOLIN HFA) 108 (90 Base) MCG/ACT inhaler Inhale 2 puffs into the lungs every 6 (six) hours as needed for wheezing or shortness of breath. 8 g 6   allopurinol (ZYLOPRIM) 100 MG tablet TAKE 1 TABLET BY MOUTH  DAILY 90 tablet 1   Blood Glucose Monitoring Suppl (ACCU-CHEK GUIDE) w/Device KIT USE AS DIRECTED 1 kit 0   carvedilol (COREG) 12.5 MG tablet TAKE 1 TABLET BY MOUTH  TWICE DAILY 180 tablet 4   celecoxib (CELEBREX) 100 MG capsule Take 100 mg by mouth daily as needed for pain.     cholecalciferol (  VITAMIN D3) 25 MCG (1000 UNIT) tablet Take 1,000 Units by mouth daily.     dapagliflozin propanediol (FARXIGA) 10 MG TABS tablet Take 1 tablet (10 mg total) by mouth daily before breakfast. 30 tablet 0   famotidine (PEPCID) 40 MG tablet TAKE 1 TABLET BY MOUTH  TWICE DAILY 180 tablet 3   fluticasone (FLONASE) 50 MCG/ACT nasal spray Place 2 sprays into both nostrils daily. (Patient taking differently: Place 2 sprays into both nostrils daily as needed for allergies.) 16 g 1   gabapentin (NEURONTIN) 300 MG capsule TAKE 3 CAPSULES BY MOUTH 3  TIMES DAILY 810 capsule 3   latanoprost (XALATAN) 0.005 % ophthalmic solution Place 1 drop into both eyes at bedtime.   11   losartan (COZAAR) 25 MG tablet Take 1 tablet (25 mg total) by mouth daily. 90 tablet 3   nystatin (MYCOSTATIN/NYSTOP) powder APPLY TOPICALLY TO AFFECTED  AREA(S) 3 TIMES DAILY AS NEEDED  FOR RASH 180 g 1   ondansetron (ZOFRAN) 4 MG tablet TAKE 1 TABLET BY MOUTH EVERY 4  TO 6 HOURS AS NEEDED FOR NAUSEA 90 tablet 0   pantoprazole (PROTONIX) 40 MG tablet TAKE 1 TABLET BY MOUTH  TWICE DAILY 180 tablet 3   potassium chloride SA (KLOR-CON M) 20 MEQ tablet TAKE 1 TABLET BY MOUTH  DAILY 90 tablet 1   rosuvastatin (CRESTOR) 10 MG tablet Take 1 tablet (10 mg total) by mouth daily. For cholesterol and heart. 90 tablet 3   scopolamine (TRANSDERM-SCOP) 1 MG/3DAYS Place 1 patch (1.5 mg total) onto the skin every 3 (three) days. 3  patch 0   Semaglutide,0.25 or 0.5MG/DOS, (OZEMPIC, 0.25 OR 0.5 MG/DOSE,) 2 MG/3ML SOPN Inject 0.5 mg into the skin once a week. 6 mL 0   spironolactone (ALDACTONE) 25 MG tablet TAKE 1 TABLET BY MOUTH  DAILY 90 tablet 3   Tart Cherry 1200 MG CAPS Take 1,200 mg by mouth 2 (two) times a week.     timolol (BETIMOL) 0.5 % ophthalmic solution Place 1 drop into both eyes every morning.      torsemide (DEMADEX) 20 MG tablet Take 1 tablet (20 mg total) by mouth daily. TAKE 2 TABLETS BY MOUTH  ALTERNATING WITH 1 TABLET  EVERY OTHER DAY 135 tablet 3   traZODone (DESYREL) 50 MG tablet 1/2-1 tablet by mouth at bedtime as needed for sleep. 30 tablet 0   No facility-administered medications prior to visit.    Allergies  Allergen Reactions   Entresto [Sacubitril-Valsartan] Other (See Comments)    "Dry mouth"    Review of Systems  Constitutional:  Negative for fever.  Genitourinary:  Positive for frequency. Negative for dysuria.       (+) Urinary Incontinence       Objective:    Physical Exam  LMP 06/17/2007  Wt Readings from Last 3 Encounters:  12/11/21 277 lb (125.6 kg)  10/17/21 225 lb (102.1 kg)  09/16/21 220 lb (99.8 kg)    Gen: Awake, alert, no acute distress Resp: Breathing is even and non-labored Psych: calm/pleasant demeanor Neuro: Alert and Oriented x 3, + facial symmetry, speech is clear.     Assessment & Plan:   Problem List Items Addressed This Visit       Unprioritized   Urinary urgency - Primary    New post void incontinence. Recommended that she begin empiric keflex. If symptoms worsen or if symptoms fail to improve, recommend that she schedule in office visit for culture.  Meds ordered this encounter  Medications   cephALEXin (KEFLEX) 500 MG capsule    Sig: Take 1 capsule (500 mg total) by mouth 4 (four) times daily.    Dispense:  20 capsule    Refill:  0    Order Specific Question:   Supervising Provider    Answer:   Penni Homans A [[id number]]    benzonatate (TESSALON) 100 MG capsule    Sig: Take 1 capsule (100 mg total) by mouth 3 (three) times daily as needed.    Dispense:  20 capsule    Refill:  0    Order Specific Question:   Supervising Provider    Answer:   Penni Homans A [[id number]]    I discussed the assessment and treatment plan with the patient. The patient was provided an opportunity to ask questions and all were answered. The patient agreed with the plan and demonstrated an understanding of the instructions.   The patient was advised to call back or seek an in-person evaluation if the symptoms worsen or if the condition fails to improve as anticipated.  I provided 20 minutes of face-to-face time during this encounter.   I,Amber Collins,acting as a Education administrator for [employer], NP.,have documented all relevant documentation on the behalf of Nance Pear, NP,as directed by  Nance Pear, NP while in the presence of Nance Pear, NP.   Nance Pear, NP [employer] [facility] [employer] [facility] [phone] (phone) [phone] (fax)  [place]

## 2022-02-19 ENCOUNTER — Ambulatory Visit (INDEPENDENT_AMBULATORY_CARE_PROVIDER_SITE_OTHER): Payer: Medicare Other | Admitting: Pharmacist

## 2022-02-19 ENCOUNTER — Other Ambulatory Visit: Payer: Self-pay | Admitting: Pharmacist

## 2022-02-19 DIAGNOSIS — E785 Hyperlipidemia, unspecified: Secondary | ICD-10-CM

## 2022-02-19 DIAGNOSIS — I10 Essential (primary) hypertension: Secondary | ICD-10-CM

## 2022-02-19 DIAGNOSIS — E119 Type 2 diabetes mellitus without complications: Secondary | ICD-10-CM

## 2022-02-19 NOTE — Telephone Encounter (Signed)
Patient requesting RF for ondansetron 4mg  - 1 tablet every 4 to 6 hours.  Last filled #90 = 15 days supply on 01/21/2022.  Pharmacy: 03/23/2022 to PCP for review and approval.

## 2022-02-19 NOTE — Patient Instructions (Signed)
Briana Miller It was a pleasure speaking with you today.  Below is a summary of your health goals and summary of our recent visit. You can also view your updated Chronic Care Management Care plan through your MyChart account.    Patient Goals/Self-Care Activities Over the next 90 days, patient will:  take medications as prescribed  check glucose daily, document, and provide at future appointments collaborate with provider on medication access solutions. I have provided information again to [facility] for 30 days of Farxiga for free. Please notify me if you have any issues getting this prescription.  Consider checking with your cardiologist to see if they might have samples for Jardiance.  Review diet information regarding prevention of gout - try to avoid / limit foods that might increase gout episodes. Monitor for signs and symptoms of Heart Failure exacerbation - weight gain, SOB, abdominal fullness, swelling in legs or abdomen, Fatigue and weakness, changes in ability to perform usual activities, persistent cough or wheezing with white or pink blood-tinged mucus, nausea and lack of appetite. Contact office immediately if you notice any of these symptoms.  restart taking gabapentin 300mg  at night to see if nerve pain / tingling symptoms improve. Gradually increase gabapentin by 300mg  daily every 3 to 4 days if needed, until max dose of 900mg  3 times a day. If neuropathy does not improve with restarting regular gabapentin then call office for appointment with primary care provider. Monitor for drowsiness and changes in cognition.   As always if you have any questions or concerns especially regarding medications, please feel free to contact me either at the phone number below or with a MyChart message.   Keep up the good work!  , PharmD Clinical Pharmacist [facility] [phone] (direct line)  [phone] (main office number)   Patient  verbalizes understanding of instructions and care plan provided today and agrees to view in MyChart. Active MyChart status and patient understanding of how to access instructions and care plan via MyChart confirmed with patient.

## 2022-02-19 NOTE — Progress Notes (Deleted)
HPI- F never smoker referred last year for evaluation of cough and suspected OSA. Complicated by Migraine, HTN, CHF/ dilated CM, Allergic Rhinitis, GERD, Cervical Myelopathy, Osteoarthritis, Morbid Obesity, Chronic Pain, Borderline Diabetes, Glaucoma, Disability (Hasn't stood up since 2017),   PFT and Sleep study were ordered but not done.  =====================================================================  10/17/21-  65 yoF never smoker followed for evaluation of Cough and suspected OSA. Complicated by Migraine, HTN, CHF/ dilated CM/ BIVICD,  Allergic Rhinitis, GERD, Cervical Myelopathy, Osteoarthritis, Morbid Obesity, Chronic Pain, Borderline Diabetes, Glaucoma, Disability (Hasn't stood up since 2017),  - Albuterol hfa,   note timolol  PFT and Sleep study were never done. Covid vax- 3 Phizer Flu - had Breztri too expensive.  She remembers having an albuterol inhaler but does not remember effect.  Persistent morning cough productive of white mucus.  Discussed possibility this was related to the pulmonary vascular congestion on CXR. I think she is too heavy for [place] to fit in our PFT system but we discussed going ahead with home sleep test. CXR 09/16/21- IMPRESSION: 1. New ICD in place. 2. Cardiomegaly with central pulmonary vascular congestion.  02/20/22-66 yoF never smoker followed for evaluation of Cough and suspected OSA. Complicated by Migraine, HTN, CHF/ dilated CM/ BIVICD,  Allergic Rhinitis, GERD, Cervical Myelopathy, Osteoarthritis, Morbid Obesity, Chronic Pain, Borderline Diabetes, Glaucoma, Depression,  Disability (Hasn't stood up since 2017),  - Albuterol hfa,   note timolol  PFT and Sleep study were never done. Covid vax- 3 Phizer   ROS-see HPI   + = positive Constitutional:    weight loss, night sweats, fevers, chills,+ fatigue, lassitude. HEENT:    headaches, difficulty swallowing, tooth/dental problems, sore throat,       sneezing, itching, ear ache, nasal congestion, post  nasal drip, snoring CV:    chest pain, orthopnea, PND, swelling in lower extremities, anasarca,                                   dizziness, palpitations Resp:   shortness of breath with exertion or at rest.                +productive cough,   non-productive cough, coughing up of blood.              change in color of mucus.  wheezing.   Skin:    rash or lesions. GI:  No-   heartburn, indigestion, abdominal pain, nausea, vomiting, diarrhea,                 change in bowel habits, loss of appetite GU: dysuria, change in color of urine, no urgency or frequency.   flank pain. MS:  + joint pain, stiffness, decreased range of motion, back pain. Neuro-     nothing unusual Psych:  change in mood or affect.  depression or anxiety.   memory loss.  OBJ- Physical Exam General- Alert, Oriented, Affect-appropriate, Distress- none acute, +Morbidly Obese, +power wheelchair  +Looks heavier than her stated weight of 225 lbs. Skin- rash-none, lesions- none, excoriation- none Lymphadenopathy- none Head- atraumatic            Eyes- Gross vision intact, PERRLA, conjunctivae and secretions clear            Ears- Hearing, canals-normal            Nose- Clear, no-Septal dev, mucus, polyps, erosion, perforation  Throat- Mallampati III , mucosa clear , drainage- none, tonsils+ present Neck- flexible , trachea midline, no stridor , thyroid nl, carotid no bruit Chest - symmetrical excursion , unlabored           Heart/CV- RRR , no murmur , no gallop  , no rub, nl s1 s2                           - JVD- none , edema heavy legs stasis changes- none, varices- none           Lung-+ few rhonchi in bases, unlabored, wheeze- none, cough- none , dullness-none, rub- none           Chest wall-  Abd-  Br/ Gen/ Rectal- Not done, not indicated Extrem- +elastic hose Neuro- grossly intact to observation

## 2022-02-19 NOTE — Chronic Care Management (AMB) (Signed)
Chronic Care Management Pharmacy Note  02/19/2022 Name:  Briana Miller MRN:  [id number] DOB:  22-Mar-1956  Summary: Type 2 DM and CHF: Patient is having difficulty with cost of medications but household income did not qualify by any medication assistance programs. She does have 2 months of Farxiga on hand currently (changed form Jardiance 11m because we would get free 30 days of FIran. She has 1  more dose of Ozempic. Home blood glucose has been 90 to 115. Denies edema. Provided sample of Ozempic - at 0.281mweekly dose will last about 5 weeks.    Reviewed signs and symptoms of CHF exacerbation - weight gain, SOB, abdominal fullness, swelling in legs or abdomen, Fatigue and weakness, changes in ability to perform usual activities, persistent cough or wheezing with white or pink blood-tinged mucus, nausea and lack of appetite. Patient had question about why she was taking 2 fluid pills. Counseled on benefits of taking both torsemide and spironolactone for CHF.  CVD Risk Recuction / Hyperlipidemia: Prescribed rosuvastatin 106maily for CVD risk reduction 12/26/2021. Last LD was 97 (12/01/2021) Neuropathy: reports more tingling in feet and legs at night. Patient has not been taking gabapentin regularly. Recommended she restart taking gabapentin 300m38m night to see if symptoms improve. She can gradually increase gabapentin by 300mg69mly every 3 to 4 days if needed, until max dose per current Rx of 900mg 55mmes a day. If neuropathy does not improve with restarting regular gabapentin then she is to call office for appointment with PCP. Reminded patient to monitor for drowsiness and changes in cognition.  Patient also mentioned having cramp. She wonders if her potassium is low. Taking potassium supplement daily. Last potasium was 4.5 (06/28/203). She will see pulmonology and will ask if they can check during visit 09/07/203. If they can't then will come to our office to check - future order  placed.  Medication Management: Epic refill hisotry showed several medications filled in May 2023 that would be past due to refill. Called [facility] and confirmed that Albuterol inhaler #4 inhalers filled 01/21/2022. Losartan, potassium, torsemide and spironolactone flled for 90 days 01/07/2022. Nystatin powder #3 bottles and rosuvastatin 10mg f39md 12/30/2021 for 90 days and Famotidine filled 01/13/2022 for 90 days.   Follow Up Plan: Telephone follow up appointment with care management team member scheduled for:  4 weeks.      Subjective: Briana Bryton Waight66 y.o.35ear old female who is a primary patient of O'SulliDebbrah AlarThe CCM team was consulted for assistance with disease management and care coordination needs.    Engaged with patient by telephone for follow up visit in response to provider referral for pharmacy case management and/or care coordination services.   Consent to Services:  The patient was given information about Chronic Care Management services, agreed to services, and gave verbal consent prior to initiation of services.  Please see initial visit note for detailed documentation.   Patient Care Team: O'SulliDebbrah Alar PCP - General (Internal Medicine) CrenshaStanford BreedSDenice Bors PCP - Cardiology (Cardiology) Neysha Criado,Cherre RobinsPP (Pharmacist)  Recent office visits: 02/19/2022 - Int Med (O'SullInda Castleideo Visit for urinary urgency and cough. Noted post void incontinence. Started cephalexin 500mg 4 23ms a day for 5 days. Also prescribed benzonatate 100mg up 68m times a day as needed for cough 12/30/2021 - Int Med (O'SullivInda Castleeo Visit. Seen for anxiety / stress. Referred for counseling and prescrived trazodone 50mg32m  at bedtime to help with sleep.  12/11/21-[name] Inda Castle , NP (PCP) Seen for diabetic follow up and urine oder visit.  Will increase ozempic from 0.46m to 0.517monce weekly. Samples provided. [facility] Labs ordered. Hold Jardiance when your run out until we can look into patient assistance. Follow up in 3 months. 09/09/21-[name] O'Inda Castle NP (PCP, Video visit)  Seen for a wound check. Follow up in 2 weeks. 08/26/21-[name] O'Inda Castle NP (PCP, Video visit) Seen for neck pain and facial pain.   Recent consult visits: 10/17/21-Clinton D. YoAnnamaria BootsMD (Pulmonology) General follow up visit. Follow up in 4 weeks. 09/06/21-Gregg W. TaLovena LeMD (Cardiology) General cardiac follow up visit. Labs ordered. 08/02/21-Follow up cardiac visit. Start Jardiance 10 mg ( Take 1 Tablet Daily). Follow up in 2 months.  Hospital visits: None in previous 6 months  Objective:  Lab Results  Component Value Date   CREATININE 1.14 12/11/2021   CREATININE 1.56 (H) 09/06/2021   CREATININE 1.37 (H) 08/02/2021    Lab Results  Component Value Date   HGBA1C 6.4 12/11/2021   Last diabetic Eye exam: No results found for: "HMDIABEYEEXA"  Last diabetic Foot exam: No results found for: "HMDIABFOOTEX"      Component Value Date/Time   CHOL 170 12/02/2019 1434   TRIG 140 12/02/2019 1434   HDL 49 (L) 12/02/2019 1434   CHOLHDL 3.5 12/02/2019 1434   VLDL 23.8 01/21/2019 1350   LDLCALC 97 12/02/2019 1434       Latest Ref Rng & Units 02/15/2021   11:43 AM 04/08/2019    2:06 PM 01/21/2019    1:50 PM  Hepatic Function  Total Protein 6.0 - 8.3 g/dL 7.4  7.2  7.7   Albumin 3.5 - 5.2 g/dL 4.1  4.2  4.6   AST 0 - 37 U/L '16  16  14   ' ALT 0 - 35 U/L '16  13  11   ' Alk Phosphatase 39 - 117 U/L 96  87  95   Total Bilirubin 0.2 - 1.2 mg/dL 0.6  0.6  0.3   Bilirubin, Direct 0.0 - 0.3 mg/dL  0.1      Lab Results  Component Value Date/Time   TSH 1.97 02/15/2021 11:43 AM   TSH 2.954 08/02/2016 02:15 AM   TSH 1.53 03/23/2015 02:30 PM       Latest Ref Rng & Units 09/06/2021   11:05 AM 02/15/2021   11:43 AM 04/08/2019    2:06 PM  CBC  WBC 3.4 - 10.8 x10E3/uL 4.7   5.0  6.0   Hemoglobin 11.1 - 15.9 g/dL 13.1  12.1  11.9   Hematocrit 34.0 - 46.6 % 39.8  36.5  35.9   Platelets 150 - 450 x10E3/uL 270  264.0  269.0     No results found for: "VD25OH"  Clinical ASCVD: No  The 10-year ASCVD risk score (Arnett DK, et al., 2019) is: 24.7%   Values used to calculate the score:     Age: [age]ears     Sex: Female     Is Non-Hispanic African American: Yes     Diabetic: Yes     Tobacco smoker: No     Systolic Blood Pressure: 14979mHg     Is BP treated: Yes     HDL Cholesterol: 49 mg/dL     Total Cholesterol: 170 mg/dL    Other: (CHADS2VASc if Afib, PHQ9 if depression, MMRC or CAT  for COPD, ACT, DEXA)  Social History   Tobacco Use  Smoking Status Never  Smokeless Tobacco Never   BP Readings from Last 3 Encounters:  12/11/21 (!) 149/84  10/17/21 122/80  09/16/21 110/69   Pulse Readings from Last 3 Encounters:  12/11/21 80  10/17/21 80  09/16/21 82   Wt Readings from Last 3 Encounters:  12/11/21 277 lb (125.6 kg)  10/17/21 225 lb (102.1 kg)  09/16/21 220 lb (99.8 kg)    Assessment: Review of patient past medical history, allergies, medications, health status, including review of consultants reports, laboratory and other test data, was performed as part of comprehensive evaluation and provision of chronic care management services.   SDOH:  (Social Determinants of Health) assessments and interventions performed:  SDOH Interventions    Flowsheet Row Chronic Care Management from 01/17/2022 in [facility] at [place] Management from 12/20/2021 in [place] at [place] Interventions    Food Insecurity Interventions -- Intervention Not Indicated  Transportation Interventions -- Intervention Not Indicated  Depression Interventions/Treatment  Currently on Treatment Medication  [Will discuss with patient's PCP]  Financial Strain Interventions -- Other (Comment)  [patient  screened for patient assistance programs and disease state fund. Household income did not meet requirements.]  Stress Interventions Intervention Not Indicated --       CCM Care Plan  Allergies  Allergen Reactions   Entresto [Sacubitril-Valsartan] Other (See Comments)    "Dry mouth"    Medications Reviewed Today     Reviewed by Jiles Prows, CMA (Certified Medical Assistant) on 02/18/22 at 1128  Med List Status: <None>   Medication Order Taking? Sig Documenting Provider Last Dose Status Informant  ACCU-CHEK GUIDE test strip [id number] Yes USE UP TO 4 TIMES DAILY Debbrah Alar, NP Taking Active   Accu-Chek Softclix Lancets lancets [id number] Yes USE UP TO 4 TIMES DAILY Debbrah Alar, NP Taking Active   acetaminophen (TYLENOL) 500 MG tablet [id number] Yes Take 1,000 mg by mouth every 8 (eight) hours as needed for moderate pain or headache. [provider] Taking Active Self  albuterol (VENTOLIN HFA) 108 (90 Base) MCG/ACT inhaler [id number] Yes Inhale 2 puffs into the lungs every 6 (six) hours as needed for wheezing or shortness of breath. Baird Lyons D, MD Taking Active   allopurinol (ZYLOPRIM) 100 MG tablet [id number] Yes TAKE 1 TABLET BY MOUTH  DAILY Debbrah Alar, NP Taking Active   Blood Glucose Monitoring Suppl (ACCU-CHEK GUIDE) w/Device KIT [id number] Yes USE AS DIRECTED Debbrah Alar, NP Taking Active   carvedilol (COREG) 12.5 MG tablet [id number] Yes TAKE 1 TABLET BY MOUTH  TWICE DAILY Croitoru, Mihai, MD Taking Active Self  celecoxib (CELEBREX) 100 MG capsule [id number] Yes Take 100 mg by mouth daily as needed for pain. [provider] Taking Active Self  cholecalciferol (VITAMIN D3) 25 MCG (1000 UNIT) tablet [id number] Yes Take 1,000 Units by mouth daily. [provider] Taking Active Self  dapagliflozin propanediol (FARXIGA) 10 MG TABS tablet [id number] Yes Take 1 tablet (10 mg total) by mouth daily before breakfast. Colon Branch, MD  Taking Active   famotidine (PEPCID) 40 MG tablet [id number] Yes TAKE 1 TABLET BY MOUTH  TWICE DAILY Debbrah Alar, NP Taking Active Self  fluticasone (FLONASE) 50 MCG/ACT nasal spray [id number] Yes Place 2 sprays into both nostrils daily.  Patient taking differently: Place 2 sprays into both nostrils daily as needed for allergies.   Debbrah Alar, NP Taking  Active Self  gabapentin (NEURONTIN) 300 MG capsule [id number] Yes TAKE 3 CAPSULES BY MOUTH 3  TIMES DAILY Debbrah Alar, NP Taking Active Self           Med Note Antony Contras, Zeniah Briney B   Fri Dec 20, 2021  1:51 PM) Taking as needed - usually 4 per day  latanoprost (XALATAN) 0.005 % ophthalmic solution [id number] Yes Place 1 drop into both eyes at bedtime.  [provider] Taking Active Self  losartan (COZAAR) 25 MG tablet [id number] Yes Take 1 tablet (25 mg total) by mouth daily. Lelon Perla, MD Taking Active Self  nystatin (MYCOSTATIN/NYSTOP) powder [id number] Yes APPLY TOPICALLY TO AFFECTED  AREA(S) 3 TIMES DAILY AS NEEDED  FOR Bennie Pierini, Lenna Sciara, NP Taking Active   ondansetron (ZOFRAN) 4 MG tablet [id number] Yes TAKE 1 TABLET BY MOUTH EVERY 4  TO 6 HOURS AS NEEDED FOR Cyndi Lennert, NP Taking Active   pantoprazole (PROTONIX) 40 MG tablet [id number] Yes TAKE 1 TABLET BY MOUTH  TWICE DAILY Milus Banister, MD Taking Active   potassium chloride SA (KLOR-CON M) 20 MEQ tablet [id number] Yes TAKE 1 TABLET BY MOUTH  DAILY Debbrah Alar, NP Taking Active   rosuvastatin (CRESTOR) 10 MG tablet [id number] Yes Take 1 tablet (10 mg total) by mouth daily. For cholesterol and heart. Colon Branch, MD Taking Active   scopolamine (TRANSDERM-SCOP) 1 MG/3DAYS [id number] Yes Place 1 patch (1.5 mg total) onto the skin every 3 (three) days. Debbrah Alar, NP Taking Active   Semaglutide,0.25 or 0.5MG/DOS, (OZEMPIC, 0.25 OR 0.5 MG/DOSE,) 2 MG/3ML SOPN [id number] Yes Inject 0.5 mg into the skin once a week. Debbrah Alar, NP Taking Active   spironolactone (ALDACTONE) 25 MG tablet [id number] Yes TAKE 1 TABLET BY MOUTH  DAILY Lelon Perla, MD Taking Active Self  Tart Cherry 1200 MG CAPS [id number] Yes Take 1,200 mg by mouth 2 (two) times a week. [provider] Taking Active Self  timolol (BETIMOL) 0.5 % ophthalmic solution [id number] Yes Place 1 drop into both eyes every morning.  [provider] Taking Active Self  torsemide (DEMADEX) 20 MG tablet [id number] Yes Take 1 tablet (20 mg total) by mouth daily. TAKE 2 TABLETS BY MOUTH  ALTERNATING WITH 1 TABLET  EVERY OTHER DAY Stanford Breed, Denice Bors, MD Taking Active   traZODone (DESYREL) 50 MG tablet [id number] Yes 1/2-1 tablet by mouth at bedtime as needed for sleep. Debbrah Alar, NP Taking Active             Patient Active Problem List   Diagnosis Date Noted   Reactive depression (situational) 12/30/2021   Insomnia 12/30/2021   Intertrigo 12/11/2021   Chronic cough 10/17/2021   Pressure injury of right thigh, stage 2 ([place]) 09/09/2021   Neck pain 07/22/2021   Controlled type 2 diabetes mellitus without complication, without long-term current use of insulin ([place]) 05/07/2021   Bunion 02/18/2021   Gout    LBBB (left bundle branch block) 02/14/2020   Unilateral primary osteoarthritis, left knee 09/09/2017   Chronic pain syndrome 07/31/2017   Cervical myelopathy ([place])    Debility 08/01/2016   HTN (hypertension) 02/25/2016   Arthritis 01/20/2016   Overactive bladder 06/23/2015   GERD (gastroesophageal reflux disease) 06/23/2015   Chronic combined systolic and diastolic heart failure ([place]) 05/15/2015   Migraines 03/23/2015   Allergic rhinitis 06/01/2013   NICM (nonischemic cardiomyopathy) ([place]) 06/01/2013   Obstructive sleep apnea 04/08/2013   FH: colon cancer 04/08/2013   Urinary urgency 04/08/2013  Morbid obesity due to excess calories ([place]) 04/08/2013    Immunization History  Administered Date(s) Administered    Influenza,inj,Quad PF,6+ Mos 04/08/2013, 04/01/2016, 04/27/2017, 08/27/2018, 04/08/2019, 02/15/2021   PFIZER(Purple Top)SARS-COV-2 Vaccination 09/03/2019, 09/15/2019, 06/29/2020   Tdap 03/23/2015    Conditions to be addressed/monitored: CHF, HTN, HLD, DMII, and Gout  Care Plan : General Pharmacy (Adult)  Updates made by Cherre Robins, RPH-CPP since 02/19/2022 12:00 AM     Problem: Chronic Conditions: type 2 diabetes; Congestive heart failure; cardiomyopathy; gout; acid reflux; arthritis; allergic rhinitis; hypertension   Priority: High  Onset Date: 12/20/2021     Long-Range Goal: Provide education, support and care coordination for medication therapy and chronic conditions   Start Date: 12/20/2021  Priority: High  Note:   Current Barriers:  Unable to independently afford treatment regimen Unable to independently monitor therapeutic efficacy Does not adhere to prescribed medication regimen  Pharmacist Clinical Goal(s):  Over the next 90 days, patient will verbalize ability to afford treatment regimen achieve adherence to monitoring guidelines and medication adherence to achieve therapeutic efficacy maintain control of type 2 diabetes and hypertension  as evidenced by A1c < 7.0% and blood pressure < 140/90  through collaboration with PharmD and provider.   Interventions: 1:1 collaboration with Debbrah Alar, NP regarding development and update of comprehensive plan of care as evidenced by provider attestation and Miller-signature Inter-disciplinary care team collaboration (see longitudinal plan of care) Comprehensive medication review performed; medication list updated in electronic medical record  Diabetes: Controlled; A1c goal < 7.0% Current treatment: Farxiga 33m - take 0.5 tablet = 56mdaily  Ozempic 0.2570mnjected subcutaneously once weekly  Current glucose readings: 80 to 140 Denies hypoglycemic/hyperglycemic symptoms Patient is in Medicare Coverage gap and cost of both  Jardiance ( ~ $165/month) and Ozempic (~ $250/month) have increased.  She saw PCP 12/11/2021 who instructed her to complete current supply of Jardiance (patient states she has about 3 tablets left) and increase Ozempic to 0.5mg3mekly. Patient was provided with 2 pen samples of Ozempic 01/17/2022: Rx for FarxWilder Glade sent to pharmacy in July but they did not use 30 days free card. Per patient cost was $147. Patient has 4 or 5 days of Jardiance left from samples provided by Dr CrenStanford Breed17/2023. BMI - 44.71; Weight 277lbs.  Patient complains of increase in nerve pain at night - has not been taking gabapentin regularly Interventions:  Reviewed home blood glucose readings and reviewed goals  Fasting blood glucose goal (before meals) = 80 to 130 Blood glucose goal after a meal = less than 180  Continue current diabetes therapy Restart gabapentin   Hypertension / Congestive Heart Failure / Cardiomyopathy: Blood pressure not at goal but CHF Controlled; Goal: blood pressure < 140/90 and to minimize symptoms of CHF and prevent hospitalization Current treatment: Farxiga 10mg36make 0.5 tablet = 5mg d96my  Torsemide 20mg d57m  Spironolactone 25mg da58m Carvedilol 12.5mg twic43m day Losartan 25mg dail55mtassium 20 mEq daily  Denies hypotensive/hypertensive symptoms Not checking weight at home - patient states she is unable to stand on scales.  Interventions:  Educated on  signs and symptoms of CHF exacerbation - weight gain, SOB, abdominal fullness, swelling in legs or abdomen, Fatigue and weakness, changes in ability to perform usual activities, persistent cough or wheezing with white or pink blood-tinged mucus, nausea and lack of appetite Continue current medications. Consider increasing losartan to 50mg daily54mblood pressure remains above 140/90  CVD Risk Recuction / Hyperlipidemia: No  current statin therapy. LDL is less than 100 but would like to see < 70.  Current treatment:   Rosuvastatin 59m daily Medications previously tried: none  Interventions:  Continue rosuvastatin 16mdaily   Depression/Anxiety: Improving since started trazodone. Patient mentions today that she has had a lot of anxiety surrounding her grandson's mental health and legal troubles. He is currently in prison but is getting psychiatric evaluation and assistance which is she grateful for.      01/17/2022   11:31 AM 12/20/2021    1:41 PM 12/02/2019    1:53 PM  PHQ9 SCORE ONLY  PHQ-9 Total Score 5 12 0  Current treatment: Trazodone 5071m take 0.5 to 1 tablet at bedtime if needed for sleep (Started 12/30/2021) She is not currently connected with mental health professional / counselor Intervention:  Continue trazodone as needed for sleep  Gout:  Improved since restarted taking allopurinol daily. Patient reports about 3 acute gout episodes per year Current treatment:  Allopurinol 100m20mily  Tart Cherry supplement daily (stopped taking recently) Interventions:  Continue allopurinol 100mg72mRY daily to prevent gout  She can restart Tart Zannie Covehe chooses. It has been shown to help prevent gout and is thought to be safe to use.  Provided diet information regarding prevention of gout with food to avoid / limit.(Addressed at previous visit )   Patient Goals/Self-Care Activities Over the next 90 days, patient will:  take medications as prescribed  check glucose daily, document, and provide at future appointments collaborate with provider on medication access solutions. I have provided information [facility] Far[facility] for free. Please notify me if you have any issues getting this prescription.  Consider checking with your cardiologist to see if they might have samples for Jardiance.  Review diet information regarding prevention of gout - try to avoid / limit foods that might increase gout episodes. Monitor for signs and symptoms of Heart Failure exacerbation -  weight gain, SOB, abdominal fullness, swelling in legs or abdomen, Fatigue and weakness, changes in ability to perform usual activities, persistent cough or wheezing with white or pink blood-tinged mucus, nausea and lack of appetite. Contact office immediately if you notice any of these symptoms.  restart taking gabapentin 300mg 69might to see if nerve pain / tingling symptoms improve. Gradually increase gabapentin by 300mg d9m every 3 to 4 days if needed, until max dose of 900mg 3 68ms a day. If neuropathy does not improve with restarting regular gabapentin then call office for appointment with primary care provider. Monitor for drowsiness and changes in cognition.   Follow Up Plan: Telephone follow up appointment with care management team member scheduled for:  4 weeks.           Medication Assistance:  Screened for medication assistance program today - household income above limit for medication assistance program and also for disease state funds.   Patient's [facility] is:  [facility] [place] [facility] [phone]-[place]-#[id number] [phone] [address]506[facility] ([facility] - [phone] Up:  Patient agrees to Care Plan and Follow-up.  Plan: Telephone follow up appointment with care management team member scheduled for:  4 weeks.   Cherre Robins, PharmD Clinical Pharmacist [facility]

## 2022-02-20 MED ORDER — ONDANSETRON HCL 4 MG PO TABS: 4.0000 mg | ORAL_TABLET | Freq: Three times a day (TID) | ORAL | 0 refills | Status: DC | PRN

## 2022-02-21 ENCOUNTER — Ambulatory Visit: Payer: Medicare Other | Admitting: Internal Medicine

## 2022-02-28 ENCOUNTER — Other Ambulatory Visit (INDEPENDENT_AMBULATORY_CARE_PROVIDER_SITE_OTHER): Payer: Medicare Other

## 2022-02-28 DIAGNOSIS — I10 Essential (primary) hypertension: Secondary | ICD-10-CM | POA: Diagnosis not present

## 2022-02-28 LAB — BASIC METABOLIC PANEL
BUN: 21 mg/dL (ref 6–23)
CO2: 28 mEq/L (ref 19–32)
Calcium: 9.4 mg/dL (ref 8.4–10.5)
Chloride: 100 mEq/L (ref 96–112)
Creatinine, Ser: 1.32 mg/dL — ABNORMAL HIGH (ref 0.40–1.20)
GFR: 42.77 mL/min — ABNORMAL LOW (ref >60.00)
Glucose, Bld: 90 mg/dL (ref 70–99)
Potassium: 4.4 mEq/L (ref 3.5–5.1)
Sodium: 138 mEq/L (ref 135–145)

## 2022-03-04 ENCOUNTER — Other Ambulatory Visit: Payer: Self-pay | Admitting: Family

## 2022-03-05 ENCOUNTER — Encounter: Payer: Medicare Other | Admitting: Internal Medicine

## 2022-03-09 ENCOUNTER — Other Ambulatory Visit: Payer: Self-pay | Admitting: Family

## 2022-03-09 ENCOUNTER — Other Ambulatory Visit: Payer: Self-pay | Admitting: Cardiology

## 2022-03-09 DIAGNOSIS — I5042 Chronic combined systolic (congestive) and diastolic (congestive) heart failure: Secondary | ICD-10-CM

## 2022-03-09 DIAGNOSIS — R601 Generalized edema: Secondary | ICD-10-CM

## 2022-03-12 ENCOUNTER — Ambulatory Visit: Payer: Medicare Other | Admitting: Physician Assistant

## 2022-03-12 ENCOUNTER — Ambulatory Visit: Payer: Medicare Other | Admitting: Skilled Nursing Facility1

## 2022-03-14 ENCOUNTER — Ambulatory Visit: Payer: Medicare Other | Admitting: Dietician

## 2022-03-15 ENCOUNTER — Other Ambulatory Visit: Payer: Self-pay | Admitting: Family

## 2022-03-15 DIAGNOSIS — E119 Type 2 diabetes mellitus without complications: Secondary | ICD-10-CM | POA: Diagnosis not present

## 2022-03-15 DIAGNOSIS — I509 Heart failure, unspecified: Secondary | ICD-10-CM | POA: Diagnosis not present

## 2022-03-15 DIAGNOSIS — E785 Hyperlipidemia, unspecified: Secondary | ICD-10-CM

## 2022-03-15 DIAGNOSIS — I110 Hypertensive heart disease with heart failure: Secondary | ICD-10-CM | POA: Diagnosis not present

## 2022-03-16 ENCOUNTER — Other Ambulatory Visit: Payer: Self-pay | Admitting: Cardiology

## 2022-03-16 DIAGNOSIS — I428 Other cardiomyopathies: Secondary | ICD-10-CM

## 2022-03-19 ENCOUNTER — Ambulatory Visit (INDEPENDENT_AMBULATORY_CARE_PROVIDER_SITE_OTHER): Payer: Medicare Other

## 2022-03-19 DIAGNOSIS — I428 Other cardiomyopathies: Secondary | ICD-10-CM | POA: Diagnosis not present

## 2022-03-19 LAB — CUP PACEART REMOTE DEVICE CHECK
Battery Remaining Longevity: 44 mo
Battery Remaining Percentage: 82 %
Battery Voltage: 2.99 V
Brady Statistic AP VP Percent: 1 %
Brady Statistic AP VS Percent: 1 %
Brady Statistic AS VP Percent: 99 %
Brady Statistic AS VS Percent: 1 %
Brady Statistic RA Percent Paced: 1 %
Date Time Interrogation Session: 20231004040016
HighPow Impedance: 80 Ohm
HighPow Impedance: 80 Ohm
Implantable Lead Implant Date: 20230403
Implantable Lead Implant Date: 20230403
Implantable Lead Implant Date: 20230403
Implantable Lead Location: 753858
Implantable Lead Location: 753859
Implantable Lead Location: 753860
Implantable Pulse Generator Implant Date: 20230403
Lead Channel Impedance Value: 400 Ohm
Lead Channel Impedance Value: 550 Ohm
Lead Channel Impedance Value: 910 Ohm
Lead Channel Pacing Threshold Amplitude: 0.75 V
Lead Channel Pacing Threshold Amplitude: 1 V
Lead Channel Pacing Threshold Amplitude: 1.25 V
Lead Channel Pacing Threshold Pulse Width: 0.5 ms
Lead Channel Pacing Threshold Pulse Width: 0.5 ms
Lead Channel Pacing Threshold Pulse Width: 0.5 ms
Lead Channel Sensing Intrinsic Amplitude: 12 mV
Lead Channel Sensing Intrinsic Amplitude: 4.8 mV
Lead Channel Setting Pacing Amplitude: 3.5 V
Lead Channel Setting Pacing Amplitude: 3.5 V
Lead Channel Setting Pacing Amplitude: 3.5 V
Lead Channel Setting Pacing Pulse Width: 0.5 ms
Lead Channel Setting Pacing Pulse Width: 0.5 ms
Lead Channel Setting Sensing Sensitivity: 0.5 mV
Pulse Gen Serial Number: 9986369

## 2022-03-21 ENCOUNTER — Ambulatory Visit: Payer: Medicare Other | Admitting: Family

## 2022-03-21 ENCOUNTER — Ambulatory Visit: Payer: Medicare Other | Admitting: Internal Medicine

## 2022-03-21 ENCOUNTER — Encounter: Payer: Self-pay | Admitting: Internal Medicine

## 2022-03-21 ENCOUNTER — Ambulatory Visit: Payer: Medicare Other | Attending: Internal Medicine | Admitting: Internal Medicine

## 2022-03-21 VITALS — BP 138/74 | HR 76 | Ht 66.0 in

## 2022-03-21 DIAGNOSIS — Z95810 Presence of automatic (implantable) cardiac defibrillator: Secondary | ICD-10-CM | POA: Insufficient documentation

## 2022-03-21 DIAGNOSIS — I5042 Chronic combined systolic (congestive) and diastolic (congestive) heart failure: Secondary | ICD-10-CM | POA: Diagnosis not present

## 2022-03-21 NOTE — Progress Notes (Signed)
HPI Ms. Dickard returns for ongoing evaluation of chronic systolic heart failure s/p Biv ICD insertion. She has a h/o chronic systolic heart failure with class 2-3 symptoms despite maximal medical therapy. She has not had syncope. She has a h/o LBBB and her EF by CMRI is 31%. She underwent insertion of a [employer] Biv ICD about 6 months ago. She has done well in the interim. No chest pain or sob.  Allergies  Allergen Reactions   Entresto [Sacubitril-Valsartan] Other (See Comments)    "Dry mouth"     Current Outpatient Medications  Medication Sig Dispense Refill   ACCU-CHEK GUIDE test strip USE UP TO 4 TIMES DAILY 400 strip 3   Accu-Chek Softclix Lancets lancets USE UP TO 4 TIMES DAILY 400 each 3   acetaminophen (TYLENOL) 500 MG tablet Take 1,000 mg by mouth every 8 (eight) hours as needed for moderate pain or headache.     albuterol (VENTOLIN HFA) 108 (90 Base) MCG/ACT inhaler Inhale 2 puffs into the lungs every 6 (six) hours as needed for wheezing or shortness of breath. 8 g 6   allopurinol (ZYLOPRIM) 100 MG tablet TAKE 1 TABLET BY MOUTH  DAILY 90 tablet 1   Blood Glucose Monitoring Suppl (ACCU-CHEK GUIDE) w/Device KIT USE AS DIRECTED 1 kit 0   carvedilol (COREG) 12.5 MG tablet TAKE 1 TABLET BY MOUTH  TWICE DAILY 180 tablet 4   celecoxib (CELEBREX) 100 MG capsule Take 100 mg by mouth daily as needed for pain.     cephALEXin (KEFLEX) 500 MG capsule Take 1 capsule (500 mg total) by mouth 4 (four) times daily. 20 capsule 0   cholecalciferol (VITAMIN D3) 25 MCG (1000 UNIT) tablet Take 1,000 Units by mouth daily.     dapagliflozin propanediol (FARXIGA) 10 MG TABS tablet Take 1 tablet (10 mg total) by mouth daily before breakfast. (Patient taking differently: Take 5 mg by mouth daily before breakfast.) 30 tablet 0   famotidine (PEPCID) 40 MG tablet TAKE 1 TABLET BY MOUTH TWICE  DAILY 180 tablet 1   fluticasone (FLONASE) 50 MCG/ACT nasal spray Place 2 sprays into both nostrils daily.  (Patient taking differently: Place 2 sprays into both nostrils daily as needed for allergies.) 16 g 1   gabapentin (NEURONTIN) 300 MG capsule TAKE 3 CAPSULES BY MOUTH 3  TIMES DAILY 810 capsule 1   latanoprost (XALATAN) 0.005 % ophthalmic solution Place 1 drop into both eyes at bedtime.   11   losartan (COZAAR) 25 MG tablet TAKE 1 TABLET BY MOUTH DAILY 100 tablet 0   nystatin (NYAMYC) powder APPLY TOPICALLY TO THE AFFECTED  AREA(S) 3 TIMES DAILY AS NEEDED  FOR RASH 180 g 1   ondansetron (ZOFRAN) 4 MG tablet Take 1 tablet (4 mg total) by mouth every 8 (eight) hours as needed for nausea or vomiting. 90 tablet 0   pantoprazole (PROTONIX) 40 MG tablet TAKE 1 TABLET BY MOUTH  TWICE DAILY 180 tablet 3   potassium chloride SA (KLOR-CON M) 20 MEQ tablet TAKE 1 TABLET BY MOUTH DAILY 90 tablet 1   rosuvastatin (CRESTOR) 10 MG tablet Take 1 tablet (10 mg total) by mouth daily. For cholesterol and heart. 90 tablet 3   scopolamine (TRANSDERM-SCOP) 1 MG/3DAYS Place 1 patch (1.5 mg total) onto the skin every 3 (three) days. 3 patch 0   Semaglutide,0.25 or 0.5MG/DOS, (OZEMPIC, 0.25 OR 0.5 MG/DOSE,) 2 MG/3ML SOPN Inject 0.5 mg into the skin once a week. 6 mL 0  spironolactone (ALDACTONE) 25 MG tablet TAKE 1 TABLET BY MOUTH DAILY 90 tablet 3   Tart Cherry 1200 MG CAPS Take 1,200 mg by mouth 2 (two) times a week.     timolol (BETIMOL) 0.5 % ophthalmic solution Place 1 drop into both eyes every morning.      torsemide (DEMADEX) 20 MG tablet TAKE 2 TABLETS BY MOUTH  ALTERNATING WITH 1 TABLET BY  MOUTH EVERY OTHER DAY 135 tablet 3   traZODone (DESYREL) 50 MG tablet 1/2-1 tablet by mouth at bedtime as needed for sleep. 30 tablet 0   No current facility-administered medications for this visit.     Past Medical History:  Diagnosis Date   Acute systolic congestive heart failure ([place]) 03/01/2014   Allergy    Anemia    Arthritis    Cardiomyopathy ([place])    Cervical myelopathy (H[facility])    CHF (congestive heart failure)  (HCC)    GERD (gastroesophageal reflux disease)    Gout    Heart disease    Hyperlipidemia    Hypertension    Leg pain    Overactive bladder     ROS:   All systems reviewed and negative except as noted in the HPI.   Past Surgical History:  Procedure Laterality Date   ANTERIOR CERVICAL DECOMP/DISCECTOMY FUSION N/A 08/03/2016   Procedure: ANTERIOR CERVICAL DECOMPRESSION/DISCECTOMY FUSION C4-5, C5-6;  Surgeon: Kristeen Miss, MD;  Location: [facility];  Service: Neurosurgery;  Laterality: N/A;   BIV ICD INSERTION CRT-D N/A 09/16/2021   Procedure: BIV ICD INSERTION CRT-D;  Surgeon: Evans Lance, MD;  Location: [facility];  Service: Cardiovascular;  Laterality: N/A;   COLONOSCOPY     COLONOSCOPY WITH PROPOFOL N/A 02/24/2019   Procedure: COLONOSCOPY WITH PROPOFOL;  Surgeon: Milus Banister, MD;  [facility];  Service: Endoscopy;  Laterality: N/A;   ESOPHAGOGASTRODUODENOSCOPY (EGD) WITH PROPOFOL N/A 04/25/2021   Procedure: ESOPHAGOGASTRODUODENOSCOPY (EGD) WITH PROPOFOL;  Surgeon: Milus Banister, MD;  [facility];  Service: Endoscopy;  Laterality: N/A;   KNEE ARTHROSCOPY     2007   ROTATOR CUFF REPAIR Right 03/23/13   TOTAL KNEE ARTHROPLASTY  09/22/2011   Procedure: TOTAL KNEE ARTHROPLASTY;  Surgeon: Rudean Haskell, MD;  Location: [facility];  Service: Orthopedics;  Laterality: Right;     Family History  Problem Relation Age of Onset   Cancer Mother        colon 59 and breast 67- deceased   Colon cancer Mother    Diabetes Father        living   Asthma Father    Hypertension Father    Heart attack Father 42       medical management per pt   Glaucoma Father        had eye transplant   Kidney failure Father        acute renal failure- died 58   Esophageal cancer Neg Hx    Stomach cancer Neg Hx    Rectal cancer Neg Hx      Social History   Socioeconomic History   Marital status: Married    Spouse name: Not on file   Number of children: 2   Years of  education: HS   Highest education level: Not on file  Occupational History   Occupation: Assembly Financial planner: [employer]  Tobacco Use   Smoking status: Never   Smokeless tobacco: Never  Vaping Use   Vaping Use: Never used  Substance and  Sexual Activity   Alcohol use: Not Currently    Alcohol/week: 2.0 standard drinks of alcohol    Types: 2 Glasses of wine per week   Drug use: No   Sexual activity: Not on file  Other Topics Concern   Not on file  Social History Narrative   Married- 63 years   2 children- grown daughter- lives in [place]- has 2 children   Grown son lives in [place]- 4 children   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   1-2 cups caffeine daily.   Lives at home with her husband.   Social Determinants of Health   Financial Resource Strain: Medium Risk (12/20/2021)   Overall Financial Resource Strain (CARDIA)    Difficulty of Paying Living Expenses: Somewhat hard  Food Insecurity: No Food Insecurity (12/20/2021)   Hunger Vital Sign    Worried About Running Out of Food in the Last Year: Never true    Ran Out of Food in the Last Year: Never true  Transportation Needs: No Transportation Needs (12/20/2021)   PRAPARE - Hydrologist (Medical): No    Lack of Transportation (Non-Medical): No  Physical Activity: Not on file  Stress: No Stress Concern Present (01/17/2022)   [place]    Feeling of Stress : Only a little  Social Connections: Not on file  Intimate Partner Violence: Not on file     BP 138/74   Pulse 76   Ht 5' 6" (1.676 m)   LMP 06/17/2007   BMI 44.71 kg/m   Physical Exam:  Well appearing 66 yo woman, NAD HEENT: Unremarkable Neck:  No JVD, no thyromegally Lymphatics:  No adenopathy Back:  No CVA tenderness Lungs:  Clear with no wheezes HEART:   Regular rate rhythm, no murmurs, no rubs, no clicks Abd:  soft, positive bowel sounds, no organomegally, no rebound, no guarding Ext:  2 plus pulses, no edema, no cyanosis, no clubbing Skin:  No rashes no nodules Neuro:  CN II through XII intact, motor grossly intact  EKG - nsr with biv pacing  DEVICE  Normal device function.  See PaceArt for details.   Assess/Plan:   Chronic systolic heart failure - she is s/p biv ICD insertion. She will continue her current meds. Obesity - she is encouraged to lose weight. ICD - her [place] ICD is working normally. We will recheck in several months.   Carleene Overlie [place],MD

## 2022-03-21 NOTE — Patient Instructions (Signed)
Medication Instructions:  Your physician recommends that you continue on your current medications as directed. Please refer to the Current Medication list given to you today.  *If you need a refill on your cardiac medications before your next appointment, please call your pharmacy*  Lab Work: None ordered.  If you have labs (blood work) drawn today and your tests are completely normal, you will receive your results only by: [place] (if you have MyChart) OR A paper copy in the mail If you have any lab test that is abnormal or we need to change your treatment, we will call you to review the results.  Testing/Procedures: None ordered.  Follow-Up: At [facility], you and your health needs are our priority.  As part of our continuing mission to provide you with exceptional heart care, we have created designated Provider Care Teams.  These Care Teams include your primary Cardiologist (physician) and Advanced Practice Providers (APPs -  Physician Assistants and Nurse Practitioners) who all work together to provide you with the care you need, when you need it.  We recommend signing up for the patient portal called "MyChart".  Sign up information is provided on this After Visit Summary.  MyChart is used to connect with patients for Virtual Visits (Telemedicine).  Patients are able to view lab/test results, encounter notes, upcoming appointments, etc.  Non-urgent messages can be sent to your provider as well.   To learn more about what you can do with MyChart, go to [email or web address].    Your next appointment:   1 year(s)  The format for your next appointment:   In Person  Provider:   Cristopher Peru, MD{or one of the following Advanced Practice Providers on your designated Care Team:   Tommye Standard, [place] Legrand Como "Jonni Sanger" Chalmers Cater, [place]  Remote monitoring is used to monitor your ICD from home. This monitoring reduces the number of office visits required to check your device to one  time per year. It allows [place] to keep an eye on the functioning of your device to ensure it is working properly. You are scheduled for a device check from home on 06/18/22. You may send your transmission at any time that day. If you have a wireless device, the transmission will be sent automatically. After your physician reviews your transmission, you will receive a postcard with your next transmission date.  Important Information About Sugar

## 2022-03-25 ENCOUNTER — Telehealth: Payer: Medicare Other

## 2022-03-28 ENCOUNTER — Telehealth (INDEPENDENT_AMBULATORY_CARE_PROVIDER_SITE_OTHER): Payer: Medicare Other | Admitting: Family

## 2022-03-28 ENCOUNTER — Telehealth: Payer: Self-pay | Admitting: Family

## 2022-03-28 DIAGNOSIS — J069 Acute upper respiratory infection, unspecified: Secondary | ICD-10-CM | POA: Insufficient documentation

## 2022-03-28 DIAGNOSIS — R1032 Left lower quadrant pain: Secondary | ICD-10-CM

## 2022-03-28 DIAGNOSIS — B349 Viral infection, unspecified: Secondary | ICD-10-CM | POA: Insufficient documentation

## 2022-03-28 MED ORDER — DAPAGLIFLOZIN PROPANEDIOL 10 MG PO TABS: 10.0000 mg | ORAL_TABLET | Freq: Every day | ORAL | 5 refills | Status: DC

## 2022-03-28 NOTE — Telephone Encounter (Signed)
Samples ready for pick up, patient advised when she comes in to let front desk know this are refrigerated samples.

## 2022-03-28 NOTE — Progress Notes (Signed)
MyChart Video Visit    Virtual Visit via Video Note   This visit type was conducted due to national recommendations for restrictions regarding the COVID-19 Pandemic (e.g. social distancing) in an effort to limit this patient's exposure and mitigate transmission in our community. This patient is at least at moderate risk for complications without adequate follow up. This format is felt to be most appropriate for this patient at this time. Physical exam was limited by quality of the video and audio technology used for the visit. CMA was able to get the patient set up on a video visit.  Patient location: Home. Patient and provider in visit Provider location: Office  I discussed the limitations of evaluation and management by telemedicine and the availability of in person appointments. The patient expressed understanding and agreed to proceed.  Visit Date: 03/28/2022  Today's healthcare provider: Nance Pear, NP     Subjective:    Patient ID: Briana Miller, female    DOB: 1955-07-19, 66 y.o.   MRN: [id number]  Chief Complaint  Patient presents with   Abdominal Pain    Complains of pain on LLQ abdomen. Pain comes and goes     HPI  Pt reports pain in the left lower abdomen started 2 months ago.   She has not had any pain today. She last noticed on Wednesday.  Reports pain is close to the pelvis.  Not made worse by bowel movements. Notes mild tenderness to palpation.  Reports some mild tenderness to palpation today.    Had viral symptoms earlier this week, did not test herself.  First day of symptoms was on 10/9.     Past Medical History:  Diagnosis Date   Acute systolic congestive heart failure ([place]) 03/01/2014   Allergy    Anemia    Arthritis    Cardiomyopathy ([place])    Cervical myelopathy ([facility])    CHF (congestive heart failure) (H[facility])    GERD (gastroesophageal reflux disease)    Gout    Heart disease    Hyperlipidemia    Hypertension    Leg pain     Overactive bladder     Past Surgical History:  Procedure Laterality Date   ANTERIOR CERVICAL DECOMP/DISCECTOMY FUSION N/A 08/03/2016   Procedure: ANTERIOR CERVICAL DECOMPRESSION/DISCECTOMY FUSION C4-5, C5-6;  Surgeon: Kristeen Miss, MD;  Location: [facility];  Service: Neurosurgery;  Laterality: N/A;   BIV ICD INSERTION CRT-D N/A 09/16/2021   Procedure: BIV ICD INSERTION CRT-D;  Surgeon: Evans Lance, MD;  Location: [facility];  Service: Cardiovascular;  Laterality: N/A;   COLONOSCOPY     COLONOSCOPY WITH PROPOFOL N/A 02/24/2019   Procedure: COLONOSCOPY WITH PROPOFOL;  Surgeon: Milus Banister, MD;  [facility];  Service: Endoscopy;  Laterality: N/A;   ESOPHAGOGASTRODUODENOSCOPY (EGD) WITH PROPOFOL N/A 04/25/2021   Procedure: ESOPHAGOGASTRODUODENOSCOPY (EGD) WITH PROPOFOL;  Surgeon: Milus Banister, MD;  [facility];  Service: Endoscopy;  Laterality: N/A;   KNEE ARTHROSCOPY     2007   ROTATOR CUFF REPAIR Right 03/23/13   TOTAL KNEE ARTHROPLASTY  09/22/2011   Procedure: TOTAL KNEE ARTHROPLASTY;  Surgeon: Rudean Haskell, MD;  Location: [facility];  Service: Orthopedics;  Laterality: Right;    Family History  Problem Relation Age of Onset   Cancer Mother        colon 60 and breast 55- deceased   Colon cancer Mother    Diabetes Father        living  Asthma Father    Hypertension Father    Heart attack Father 68       medical management per pt   Glaucoma Father        had eye transplant   Kidney failure Father        acute renal failure- died 43   Esophageal cancer Neg Hx    Stomach cancer Neg Hx    Rectal cancer Neg Hx     Social History   Socioeconomic History   Marital status: Married    Spouse name: Not on file   Number of children: 2   Years of education: HS   Highest education level: Not on file  Occupational History   Occupation: Assembly Financial planner: [employer]  Tobacco Use   Smoking status: Never   Smokeless tobacco: Never  Vaping Use    Vaping Use: Never used  Substance and Sexual Activity   Alcohol use: Not Currently    Alcohol/week: 2.0 standard drinks of alcohol    Types: 2 Glasses of wine per week   Drug use: No   Sexual activity: Not on file  Other Topics Concern   Not on file  Social History Narrative   Married- 30 years   2 children- grown daughter- lives in [place]- has 2 children   Grown son lives in [place]- 4 children   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   1-2 cups caffeine daily.   Lives at home with her husband.   Social Determinants of Health   Financial Resource Strain: Medium Risk (12/20/2021)   Overall Financial Resource Strain (CARDIA)    Difficulty of Paying Living Expenses: Somewhat hard  Food Insecurity: No Food Insecurity (12/20/2021)   Hunger Vital Sign    Worried About Running Out of Food in the Last Year: Never true    Ran Out of Food in the Last Year: Never true  Transportation Needs: No Transportation Needs (12/20/2021)   PRAPARE - Hydrologist (Medical): No    Lack of Transportation (Non-Medical): No  Physical Activity: Not on file  Stress: No Stress Concern Present (01/17/2022)   [place]    Feeling of Stress : Only a little  Social Connections: Not on file  Intimate Partner Violence: Not on file    Outpatient Medications Prior to Visit  Medication Sig Dispense Refill   ACCU-CHEK GUIDE test strip USE UP TO 4 TIMES DAILY 400 strip 3   Accu-Chek Softclix Lancets lancets USE UP TO 4 TIMES DAILY 400 each 3   acetaminophen (TYLENOL) 500 MG tablet Take 1,000 mg by mouth every 8 (eight) hours as needed for moderate pain or headache.     albuterol (VENTOLIN HFA) 108 (90 Base) MCG/ACT inhaler Inhale 2 puffs into the lungs every 6 (six) hours as needed for wheezing or shortness of breath.  8 g 6   allopurinol (ZYLOPRIM) 100 MG tablet TAKE 1 TABLET BY MOUTH  DAILY 90 tablet 1   Blood Glucose Monitoring Suppl (ACCU-CHEK GUIDE) w/Device KIT USE AS DIRECTED 1 kit 0   carvedilol (COREG) 12.5 MG tablet TAKE 1 TABLET BY MOUTH  TWICE DAILY 180 tablet 4   celecoxib (CELEBREX) 100 MG capsule Take 100 mg by mouth daily as needed for pain.     cephALEXin (KEFLEX) 500 MG  capsule Take 1 capsule (500 mg total) by mouth 4 (four) times daily. 20 capsule 0   cholecalciferol (VITAMIN D3) 25 MCG (1000 UNIT) tablet Take 1,000 Units by mouth daily.     famotidine (PEPCID) 40 MG tablet TAKE 1 TABLET BY MOUTH TWICE  DAILY 180 tablet 1   fluticasone (FLONASE) 50 MCG/ACT nasal spray Place 2 sprays into both nostrils daily. (Patient taking differently: Place 2 sprays into both nostrils daily as needed for allergies.) 16 g 1   gabapentin (NEURONTIN) 300 MG capsule TAKE 3 CAPSULES BY MOUTH 3  TIMES DAILY 810 capsule 1   latanoprost (XALATAN) 0.005 % ophthalmic solution Place 1 drop into both eyes at bedtime.   11   losartan (COZAAR) 25 MG tablet TAKE 1 TABLET BY MOUTH DAILY 100 tablet 0   nystatin (NYAMYC) powder APPLY TOPICALLY TO THE AFFECTED  AREA(S) 3 TIMES DAILY AS NEEDED  FOR RASH 180 g 1   ondansetron (ZOFRAN) 4 MG tablet Take 1 tablet (4 mg total) by mouth every 8 (eight) hours as needed for nausea or vomiting. 90 tablet 0   pantoprazole (PROTONIX) 40 MG tablet TAKE 1 TABLET BY MOUTH  TWICE DAILY 180 tablet 3   potassium chloride SA (KLOR-CON M) 20 MEQ tablet TAKE 1 TABLET BY MOUTH DAILY 90 tablet 1   rosuvastatin (CRESTOR) 10 MG tablet Take 1 tablet (10 mg total) by mouth daily. For cholesterol and heart. 90 tablet 3   scopolamine (TRANSDERM-SCOP) 1 MG/3DAYS Place 1 patch (1.5 mg total) onto the skin every 3 (three) days. 3 patch 0   Semaglutide,0.25 or 0.5MG/DOS, (OZEMPIC, 0.25 OR 0.5 MG/DOSE,) 2 MG/3ML SOPN Inject 0.5 mg into the skin once a week. 6 mL 0   spironolactone (ALDACTONE) 25 MG tablet TAKE  1 TABLET BY MOUTH DAILY 90 tablet 3   Tart Cherry 1200 MG CAPS Take 1,200 mg by mouth 2 (two) times a week.     timolol (BETIMOL) 0.5 % ophthalmic solution Place 1 drop into both eyes every morning.      torsemide (DEMADEX) 20 MG tablet TAKE 2 TABLETS BY MOUTH  ALTERNATING WITH 1 TABLET BY  MOUTH EVERY OTHER DAY 135 tablet 3   traZODone (DESYREL) 50 MG tablet 1/2-1 tablet by mouth at bedtime as needed for sleep. 30 tablet 0   dapagliflozin propanediol (FARXIGA) 10 MG TABS tablet Take 1 tablet (10 mg total) by mouth daily before breakfast. (Patient taking differently: Take 5 mg by mouth daily before breakfast.) 30 tablet 0   No facility-administered medications prior to visit.    Allergies  Allergen Reactions   Entresto [Sacubitril-Valsartan] Other (See Comments)    "Dry mouth"    ROS See HPI    Objective:    Physical Exam  LMP 06/17/2007  Wt Readings from Last 3 Encounters:  12/11/21 277 lb (125.6 kg)  10/17/21 225 lb (102.1 kg)  09/16/21 220 lb (99.8 kg)   Gen: Awake, alert, no acute distress Resp: Breathing is even and non-labored Psych: calm/pleasant demeanor Neuro: Alert and Oriented x 3, + facial symmetry, speech is clear.      Assessment & Plan:   Problem List Items Addressed This Visit       Unprioritized   Viral URI    Clinically stable. She has had exposure to COVID, so I advised her to complete a home test kit and let us know if she tests positive for COVID.       Left lower quadrant abdominal pain -  Primary    New.  Will obtain pelvic US for further evaluation. She is advised to go to the ER if she develops fever, worsening abdominal pain.       Relevant Orders   US Pelvic Complete With Transvaginal    I am having Karren Burly maintain her latanoprost, timolol, carvedilol, celecoxib, cholecalciferol, Tart Cherry, fluticasone, acetaminophen, pantoprazole, Accu-Chek Softclix Lancets, Accu-Chek Guide, albuterol, Accu-Chek Guide, Ozempic (0.25 or 0.5  MG/DOSE), rosuvastatin, traZODone, allopurinol, scopolamine, cephALEXin, ondansetron, nystatin, gabapentin, spironolactone, potassium chloride SA, torsemide, famotidine, losartan, and dapagliflozin propanediol.  Meds ordered this encounter  Medications   dapagliflozin propanediol (FARXIGA) 10 MG TABS tablet    Sig: Take 1 tablet (10 mg total) by mouth daily before breakfast.    Dispense:  30 tablet    Refill:  5    Please use 30 day free card - do no process on Medicare Part D. (Card info BIN: [id number] / PCN: CN / ID: [id number] / GRP: [id number])    Order Specific Question:   Supervising Provider    Answer:   Penni Homans A [[id number]]    I discussed the assessment and treatment plan with the patient. The patient was provided an opportunity to ask questions and all were answered. The patient agreed with the plan and demonstrated an understanding of the instructions.   The patient was advised to call back or seek an in-person evaluation if the symptoms worsen or if the condition fails to improve as anticipated.  Nance Pear, NP [employer] [facility] [phone] (phone) [phone] (fax)  [place]

## 2022-03-28 NOTE — Progress Notes (Signed)
Remote ICD transmission.   

## 2022-03-28 NOTE — Assessment & Plan Note (Signed)
Clinically stable. She has had exposure to COVID, so I advised her to complete a home test kit and let us know if she tests positive for COVID.

## 2022-03-28 NOTE — Telephone Encounter (Signed)
Can you please prepare some ozempic samples for patient. She will stop by to pick up.

## 2022-03-28 NOTE — Assessment & Plan Note (Addendum)
New.  Will obtain pelvic US for further evaluation. She is advised to go to the ER if she develops fever, worsening abdominal pain.

## 2022-04-02 ENCOUNTER — Telehealth (HOSPITAL_BASED_OUTPATIENT_CLINIC_OR_DEPARTMENT_OTHER): Payer: Self-pay

## 2022-04-05 ENCOUNTER — Ambulatory Visit (HOSPITAL_BASED_OUTPATIENT_CLINIC_OR_DEPARTMENT_OTHER): Payer: Medicare Other

## 2022-04-11 ENCOUNTER — Ambulatory Visit: Payer: Medicare Other | Attending: Physician Assistant | Admitting: Physician Assistant

## 2022-04-11 ENCOUNTER — Other Ambulatory Visit: Payer: Self-pay | Admitting: Family

## 2022-04-11 ENCOUNTER — Encounter: Payer: Self-pay | Admitting: Physician Assistant

## 2022-04-11 VITALS — BP 127/77 | HR 85

## 2022-04-11 DIAGNOSIS — E119 Type 2 diabetes mellitus without complications: Secondary | ICD-10-CM

## 2022-04-11 DIAGNOSIS — I10 Essential (primary) hypertension: Secondary | ICD-10-CM | POA: Diagnosis not present

## 2022-04-11 DIAGNOSIS — I5022 Chronic systolic (congestive) heart failure: Secondary | ICD-10-CM | POA: Diagnosis not present

## 2022-04-11 DIAGNOSIS — Z95810 Presence of automatic (implantable) cardiac defibrillator: Secondary | ICD-10-CM | POA: Diagnosis not present

## 2022-04-11 DIAGNOSIS — I428 Other cardiomyopathies: Secondary | ICD-10-CM

## 2022-04-11 NOTE — Progress Notes (Unsigned)
Cardiology Office Note:    Date:  04/13/2022   ID:  Briana Miller, DOB 03/21/1956, MRN [id number]  PCP:  Debbrah Alar, NP   [facility] Cardiologist:  Kirk Ruths, MD Electrophysiologist:  Cristopher Peru, MD     Referring MD: Debbrah Alar, NP   Chief Complaint  Patient presents with   Follow-up    Seen for Dr. Stanford Breed    History of Present Illness:    Briana Miller is a 66 y.o. female with a hx of chronic systolic heart failure, nonischemic cardiomyopathy, hypertension, hyperlipidemia, DM2, cervical myelopathy, GERD, anemia, gout, obstructive sleep apnea not on CPAP, and obesity.  Echocardiogram in October 2014 showed EF 40 to 45%, mild LVH, mild LAE.  Myoview in December 2014 showed EF 51%, no evidence of ischemia.  A coronary CT obtained in August 2021 showed calcium score of 0, no evidence of CAD.  Repeat echocardiogram obtained in June 2022 showed EF 30 to 35%, moderately decreased LV function, global hypokinesis, mild V dilatation, grade 1 DD, mild to moderate MR.  Follow-up cardiac MRI was recommended to rule out infiltrative cardiomyopathy.  This was performed in February 2023 that showed EF 31%, septal dyskinesis consistent with LBBB, late gadolinium enhancement and RV insertion site, nonspecific scar pattern often seen in the setting of elevated pulmonary pressure, normal RV size and systolic function, no evidence of infiltrative cardiomyopathy.  Patient was subsequently referred to EP and has since underwent a Saint Jude CRT-D therapy on 09/16/2021 by Dr. Lovena Le.  Patient presents today for follow-up.  She denies any recent chest pain.  She says she may have a little  more short of breath than usual.  On physical exam, she appears to be euvolemic on exam.  She has no lower extremity edema.  She gets around with a electric wheelchair.  We discussed fluid restriction and salt restriction.  She is on appropriate heart failure  medication.  For some reason, she has been taking 5 mg of Farxiga instead of the 10 mg that is indicated for heart failure.  I asked her to go back to 10 mg daily of Farxiga.  I gave her 4 boxes of Farxiga samples.  She can follow-up with Dr. Stanford Breed again in 6 months.   Past Medical History:  Diagnosis Date   Acute systolic congestive heart failure ([place]) 03/01/2014   Allergy    Anemia    Arthritis    Cardiomyopathy ([place])    Cervical myelopathy (HCC)    CHF (congestive heart failure) (HCC)    GERD (gastroesophageal reflux disease)    Gout    Heart disease    Hyperlipidemia    Hypertension    Leg pain    Overactive bladder     Past Surgical History:  Procedure Laterality Date   ANTERIOR CERVICAL DECOMP/DISCECTOMY FUSION N/A 08/03/2016   Procedure: ANTERIOR CERVICAL DECOMPRESSION/DISCECTOMY FUSION C4-5, C5-6;  Surgeon: Kristeen Miss, MD;  Location: [facility];  Service: Neurosurgery;  Laterality: N/A;   BIV ICD INSERTION CRT-D N/A 09/16/2021   Procedure: BIV ICD INSERTION CRT-D;  Surgeon: Evans Lance, MD;  Location: [facility];  Service: Cardiovascular;  Laterality: N/A;   COLONOSCOPY     COLONOSCOPY WITH PROPOFOL N/A 02/24/2019   Procedure: COLONOSCOPY WITH PROPOFOL;  Surgeon: Milus Banister, MD;  Location: [facility];  Service: Endoscopy;  Laterality: N/A;   ESOPHAGOGASTRODUODENOSCOPY (EGD) WITH PROPOFOL N/A 04/25/2021   Procedure: ESOPHAGOGASTRODUODENOSCOPY (EGD) WITH PROPOFOL;  Surgeon: Owens Loffler  P, MD;  Location: [facility];  Service: Endoscopy;  Laterality: N/A;   KNEE ARTHROSCOPY     2007   ROTATOR CUFF REPAIR Right 03/23/13   TOTAL KNEE ARTHROPLASTY  09/22/2011   Procedure: TOTAL KNEE ARTHROPLASTY;  Surgeon: Rudean Haskell, MD;  Location: [facility];  Service: Orthopedics;  Laterality: Right;    Current Medications: Current Meds  Medication Sig   ACCU-CHEK GUIDE test strip USE UP TO 4 TIMES DAILY   Accu-Chek Softclix Lancets lancets USE UP TO 4 TIMES DAILY    acetaminophen (TYLENOL) 500 MG tablet Take 1,000 mg by mouth every 8 (eight) hours as needed for moderate pain or headache.   albuterol (VENTOLIN HFA) 108 (90 Base) MCG/ACT inhaler Inhale 2 puffs into the lungs every 6 (six) hours as needed for wheezing or shortness of breath.   allopurinol (ZYLOPRIM) 100 MG tablet TAKE 1 TABLET BY MOUTH  DAILY   Blood Glucose Monitoring Suppl (ACCU-CHEK GUIDE) w/Device KIT USE AS DIRECTED   carvedilol (COREG) 12.5 MG tablet TAKE 1 TABLET BY MOUTH  TWICE DAILY   celecoxib (CELEBREX) 100 MG capsule Take 100 mg by mouth daily as needed for pain.   cephALEXin (KEFLEX) 500 MG capsule Take 1 capsule (500 mg total) by mouth 4 (four) times daily.   cholecalciferol (VITAMIN D3) 25 MCG (1000 UNIT) tablet Take 1,000 Units by mouth daily.   dapagliflozin propanediol (FARXIGA) 10 MG TABS tablet Take 1 tablet (10 mg total) by mouth daily before breakfast. (Patient taking differently: Take 10 mg by mouth daily before breakfast. 1/2 tablet daily)   famotidine (PEPCID) 40 MG tablet TAKE 1 TABLET BY MOUTH TWICE  DAILY   fluticasone (FLONASE) 50 MCG/ACT nasal spray Place 2 sprays into both nostrils daily. (Patient taking differently: Place 2 sprays into both nostrils daily as needed for allergies.)   latanoprost (XALATAN) 0.005 % ophthalmic solution Place 1 drop into both eyes at bedtime.    losartan (COZAAR) 25 MG tablet TAKE 1 TABLET BY MOUTH DAILY   nystatin (NYAMYC) powder APPLY TOPICALLY TO THE AFFECTED  AREA(S) 3 TIMES DAILY AS NEEDED  FOR RASH   pantoprazole (PROTONIX) 40 MG tablet TAKE 1 TABLET BY MOUTH  TWICE DAILY   potassium chloride SA (KLOR-CON M) 20 MEQ tablet TAKE 1 TABLET BY MOUTH DAILY   rosuvastatin (CRESTOR) 10 MG tablet Take 1 tablet (10 mg total) by mouth daily. For cholesterol and heart.   scopolamine (TRANSDERM-SCOP) 1 MG/3DAYS Place 1 patch (1.5 mg total) onto the skin every 3 (three) days.   Semaglutide,0.25 or 0.5MG/DOS, (OZEMPIC, 0.25 OR 0.5 MG/DOSE,) 2  MG/3ML SOPN Inject 0.5 mg into the skin once a week.   spironolactone (ALDACTONE) 25 MG tablet TAKE 1 TABLET BY MOUTH DAILY   timolol (BETIMOL) 0.5 % ophthalmic solution Place 1 drop into both eyes every morning.    torsemide (DEMADEX) 20 MG tablet TAKE 2 TABLETS BY MOUTH  ALTERNATING WITH 1 TABLET BY  MOUTH EVERY OTHER DAY   traZODone (DESYREL) 50 MG tablet 1/2-1 tablet by mouth at bedtime as needed for sleep.   [DISCONTINUED] ondansetron (ZOFRAN) 4 MG tablet Take 1 tablet (4 mg total) by mouth every 8 (eight) hours as needed for nausea or vomiting.     Allergies:   Entresto [sacubitril-valsartan]   Social History   Socioeconomic History   Marital status: Married    Spouse name: Not on file   Number of children: 2   Years of education: HS   Highest education  level: Not on file  Occupational History   Occupation: Assembly Financial planner: [employer]  Tobacco Use   Smoking status: Never   Smokeless tobacco: Never  Vaping Use   Vaping Use: Never used  Substance and Sexual Activity   Alcohol use: Not Currently    Alcohol/week: 2.0 standard drinks of alcohol    Types: 2 Glasses of wine per week   Drug use: No   Sexual activity: Not on file  Other Topics Concern   Not on file  Social History Narrative   Married- 47 years   2 children- grown daughter- lives in [place]- has 2 children   Grown son lives in [place]- 4 children   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   1-2 cups caffeine daily.   Lives at home with her husband.   Social Determinants of Health   Financial Resource Strain: Medium Risk (12/20/2021)   Overall Financial Resource Strain (CARDIA)    Difficulty of Paying Living Expenses: Somewhat hard  Food Insecurity: No Food Insecurity (12/20/2021)   Hunger Vital Sign    Worried About Running Out of Food in the Last Year: Never true    Ran Out of Food in the Last  Year: Never true  Transportation Needs: No Transportation Needs (12/20/2021)   PRAPARE - Hydrologist (Medical): No    Lack of Transportation (Non-Medical): No  Physical Activity: Not on file  Stress: No Stress Concern Present (01/17/2022)   [place]    Feeling of Stress : Only a little  Social Connections: Not on file     Family History: The patient's family history includes Asthma in her father; Cancer in her mother; Colon cancer in her mother; Diabetes in her father; Glaucoma in her father; Heart attack (age of onset: 81) in her father; Hypertension in her father; Kidney failure in her father. There is no history of Esophageal cancer, Stomach cancer, or Rectal cancer.  ROS:   Please see the history of present illness.     All other systems reviewed and are negative.  EKGs/Labs/Other Studies Reviewed:    The following studies were reviewed today:  Echo 11/30/2020 1. Left ventricular ejection fraction, by estimation, is 30 to 35%. The  left ventricle has moderately decreased function. The left ventricle  demonstrates global hypokinesis. The left ventricular internal cavity size  was mildly dilated. Left ventricular  diastolic parameters are consistent with Grade I diastolic dysfunction  (impaired relaxation).   2. Right ventricular systolic function is normal. The right ventricular  size is normal. There is moderately elevated pulmonary artery systolic  pressure. The estimated right ventricular systolic pressure is 42.7 mmHg.   3. The mitral valve is abnormal. Mild to moderate mitral valve  regurgitation.   4. The aortic valve is tricuspid. Aortic valve regurgitation is not  visualized.   5. The inferior vena cava is normal in size with <50% respiratory  variability, suggesting right atrial pressure of 8 mmHg.   Comparison(s): No significant change from prior study. 03/30/2020: LVEF   30-35%, global hypokinesis.   EKG:  EKG is not ordered today.    Recent Labs: 09/06/2021: Hemoglobin 13.1; Platelets 270 02/28/2022: BUN 21; Creatinine, Ser 1.32; Potassium 4.4; Sodium 138  Recent Lipid Panel    Component Value Date/Time   CHOL 170  12/02/2019 1434   TRIG 140 12/02/2019 1434   HDL 49 (L) 12/02/2019 1434   CHOLHDL 3.5 12/02/2019 1434   VLDL 23.8 01/21/2019 1350   LDLCALC 97 12/02/2019 1434     Risk Assessment/Calculations:           Physical Exam:    VS:  BP 127/77 (BP Location: Left Arm, Patient Position: Sitting)   Pulse 85   LMP 06/17/2007   SpO2 99%        Wt Readings from Last 3 Encounters:  12/11/21 277 lb (125.6 kg)  10/17/21 225 lb (102.1 kg)  09/16/21 220 lb (99.8 kg)     GEN:  Well nourished, well developed in no acute distress HEENT: Normal NECK: No JVD; No carotid bruits LYMPHATICS: No lymphadenopathy CARDIAC: RRR, no murmurs, rubs, gallops RESPIRATORY:  Clear to auscultation without rales, wheezing or rhonchi  ABDOMEN: Soft, non-tender, non-distended MUSCULOSKELETAL:  No edema; No deformity  SKIN: Warm and dry NEUROLOGIC:  Alert and oriented x 3 PSYCHIATRIC:  Normal affect   ASSESSMENT:    1. NICM (nonischemic cardiomyopathy) ([place])   2. Chronic systolic heart failure ([place])   3. Essential hypertension   4. Controlled type 2 diabetes mellitus without complication, without long-term current use of insulin (HCC)   5. Cardiac resynchronization therapy defibrillator (CRT-D) in place    PLAN:    In order of problems listed above:  Nonischemic cardiomyopathy: Previous coronary CT showed no coronary artery disease.  On carvedilol, Farxiga, spironolactone and losartan.  For some reason, she is taking 5 mg of Farxiga rather than the 10 mg heart failure dosing.  According to the patient, this is more has to do with cost.  I have highly recommended she increase Farxiga back up to 10 mg daily.  I gave her 4 weeks of samples of  Farxiga  Chronic systolic heart failure: Euvolemic on exam  Hypertension: Blood pressure well controlled  DM 2: Managed by primary care provider  CRT-D: Underwent implantation by Dr. Lovena Le.  Followed by EP service.           Medication Adjustments/Labs and Tests Ordered: Current medicines are reviewed at length with the patient today.  Concerns regarding medicines are outlined above.  No orders of the defined types were placed in this encounter.  No orders of the defined types were placed in this encounter.   Patient Instructions  Medication Instructions:  TAKE Farxiga 10 mg (1 tablet) daily   *If you need a refill on your cardiac medications before your next appointment, please call your pharmacy*  Lab Work: NONE ordered at this time of appointment   If you have labs (blood work) drawn today and your tests are completely normal, you will receive your results only by: [place] (if you have MyChart) OR A paper copy in the mail If you have any lab test that is abnormal or we need to change your treatment, we will call you to review the results.  Testing/Procedures: NONE ordered at this time of appointment   Follow-Up: At [facility], you and your health needs are our priority.  As part of our continuing mission to provide you with exceptional heart care, we have created designated Provider Care Teams.  These Care Teams include your primary Cardiologist (physician) and Advanced Practice Providers (APPs -  Physician Assistants and Nurse Practitioners) who all work together to provide you with the care you need, when you need it.   Your next appointment:   6 month(s)  The format for your next appointment:   In Person  Provider:   Kirk Ruths, MD     Other Instructions Medication Samples have been provided to the patient.  Drug name: Wilder Glade (Dapagliflozin)       Strength: 10 mg         Qty: 4 boxes  LOT: [id number]  Exp.Date: 2024-10-13  Dosing  instructions: Take 1 tablet by mouth daily  The patient has been instructed regarding the correct time, dose, and frequency of taking this medication, including desired effects and most common side effects.   [name] 12:07 PM 04/11/2022   Important Information About Sugar         Hilbert Corrigan, PA  04/13/2022 11:19 PM    [facility]

## 2022-04-11 NOTE — Patient Instructions (Signed)
Medication Instructions:  TAKE Farxiga 10 mg (1 tablet) daily   *If you need a refill on your cardiac medications before your next appointment, please call your pharmacy*  Lab Work: NONE ordered at this time of appointment   If you have labs (blood work) drawn today and your tests are completely normal, you will receive your results only by: [place] (if you have MyChart) OR A paper copy in the mail If you have any lab test that is abnormal or we need to change your treatment, we will call you to review the results.  Testing/Procedures: NONE ordered at this time of appointment   Follow-Up: At [facility], you and your health needs are our priority.  As part of our continuing mission to provide you with exceptional heart care, we have created designated Provider Care Teams.  These Care Teams include your primary Cardiologist (physician) and Advanced Practice Providers (APPs -  Physician Assistants and Nurse Practitioners) who all work together to provide you with the care you need, when you need it.   Your next appointment:   6 month(s)  The format for your next appointment:   In Person  Provider:   Kirk Ruths, MD     Other Instructions Medication Samples have been provided to the patient.  Drug name: Wilder Glade (Dapagliflozin)       Strength: 10 mg         Qty: 4 boxes  LOT: [id number]  Exp.Date: 2024-10-13  Dosing instructions: Take 1 tablet by mouth daily  The patient has been instructed regarding the correct time, dose, and frequency of taking this medication, including desired effects and most common side effects.   Briana Miller 12:07 PM 04/11/2022   Important Information About Sugar

## 2022-04-13 ENCOUNTER — Encounter: Payer: Self-pay | Admitting: Physician Assistant

## 2022-04-14 ENCOUNTER — Other Ambulatory Visit: Payer: Self-pay | Admitting: Family

## 2022-04-15 ENCOUNTER — Other Ambulatory Visit: Payer: Self-pay | Admitting: Family

## 2022-04-17 ENCOUNTER — Encounter: Payer: Medicare Other | Attending: Family | Admitting: Dietician

## 2022-04-17 ENCOUNTER — Encounter: Payer: Self-pay | Admitting: Dietician

## 2022-04-17 VITALS — Ht 66.0 in

## 2022-04-17 DIAGNOSIS — E1169 Type 2 diabetes mellitus with other specified complication: Secondary | ICD-10-CM | POA: Insufficient documentation

## 2022-04-17 NOTE — Progress Notes (Signed)
Diabetes Self-Management Education  Visit Type: First/Initial  Appt. Start Time: 0910 Appt. End Time: 1010  04/17/2022  [name], identified by name and date of birth, is a 66 y.o. female with a diagnosis of Diabetes: Type 2.   ASSESSMENT  Primary concern: Pt is concerned about what foods she should be eating since her A1c has gone up.   History includes: allergies, anemia, arthritis, CHF, GERD, HLD, HTN, type 2 diabetes, COPD, asthma, cancer. Labs noted: 02/28/22: GFR 42.77, creatinine 1.32. 12/11/21, A1c 6.4% down from 6.7% on 08/02/21.  Medications include: faxiga, semaglutide. Supplements: vitamin D3  Patient lives with husband. Pt's husband does shopping and cooking.  Pt states her medications are expensive and has been getting free ozempic from her doctors office.   Pt states she gets weak and dizzy in the morning and when she eats it goes away. Pt does not like checking blood sugar due to fear but she occasionally lets her daughter check it once every two weeks.   Pt enjoys doing dancing in her wheel chair. She has purchased a wheel chair workout video in the past.   Pt states MD recommended 32-48 oz of fluids daily.   Pt states her stress levels are high and it effects her sleep. She states she is getting 4-5 hours nightly.   Pt with high sodium intake- pickles, doritos, bacon, etc.   Height 5\' 6"  (1.676 m), last menstrual period 06/17/2007. Body mass index is 44.71 kg/m.   Diabetes Self-Management Education - 04/17/22 0919       Visit Information   Visit Type First/Initial      Initial Visit   Diabetes Type Type 2    Date Diagnosed 10/2020    Are you currently following a meal plan? No    Are you taking your medications as prescribed? Yes      Health Coping   How would you rate your overall health? Fair      Psychosocial Assessment   Patient Belief/Attitude about Diabetes Motivated to manage diabetes    What is the hardest part about your  diabetes right now, causing you the most concern, or is the most worrisome to you about your diabetes?   Making healty food and beverage choices;Taking/obtaining medications    Self-care barriers None    Self-management support Doctor's office    Other persons present Patient    Patient Concerns Healthy Lifestyle    Special Needs None    Preferred Learning Style No preference indicated    Learning Readiness Ready    How often do you need to have someone help you when you read instructions, pamphlets, or other written materials from your doctor or pharmacy? 1 - Never    What is the last grade level you completed in school? 12th      Pre-Education Assessment   Patient understands the diabetes disease and treatment process. Needs Instruction    Patient understands incorporating nutritional management into lifestyle. Needs Instruction    Patient undertands incorporating physical activity into lifestyle. Needs Instruction    Patient understands using medications safely. Needs Instruction    Patient understands monitoring blood glucose, interpreting and using results Needs Instruction    Patient understands prevention, detection, and treatment of acute complications. Needs Instruction    Patient understands prevention, detection, and treatment of chronic complications. Needs Instruction    Patient understands how to develop strategies to address psychosocial issues. Needs Instruction    Patient understands how to develop strategies to  promote health/change behavior. Needs Instruction      Complications   Last HgB A1C per patient/outside source 6.4 %    How often do you check your blood sugar? 0 times/day (not testing)    Number of hypoglycemic episodes per month 3   pt states she gets weak and dizzy in the morning and when she eats it goes away.   Can you tell when your blood sugar is low? Yes    What do you do if your blood sugar is low? sips some orange juice    Have you had a dilated eye  exam in the past 12 months? Yes    Have you had a dental exam in the past 12 months? No    Are you checking your feet? Yes    How many days per week are you checking your feet? 1      Dietary Intake   Breakfast 2 pieces bacon and 1 toast OR cheese toast OR waits until lunch    Snack (morning) none OR pickles    Lunch sub 6 inch with deli meats and lettuce and mayo OR piece of chicken with bread and sometimes rice or green beans    Snack (afternoon) none OR pickles OR watermelon    Dinner baked chicken breast with mustard and red onion with broccoli and cheese and [employer] salad    Snack (evening) none OR doritos OR strawberries or banana    Beverage(s) coffee with cream, 16 oz gingerale,  32 oz water.      Activity / Exercise   Activity / Exercise Type Light (walking / raking leaves)    How many days per week do you exercise? 3    How many minutes per day do you exercise? 15    Total minutes per week of exercise 45      Patient Education   Previous Diabetes Education No    Disease Pathophysiology Explored patient's options for treatment of their diabetes;Definition of diabetes, type 1 and 2, and the diagnosis of diabetes;Factors that contribute to the development of diabetes    Healthy Eating Role of diet in the treatment of diabetes and the relationship between the three main macronutrients and blood glucose level;Food label reading, portion sizes and measuring food.;Plate Method;Carbohydrate counting;Reviewed blood glucose goals for pre and post meals and how to evaluate the patients' food intake on their blood glucose level.;Meal timing in regards to the patients' current diabetes medication.;Information on hints to eating out and maintain blood glucose control.;Meal options for control of blood glucose level and chronic complications.    Being Active Role of exercise on diabetes management, blood pressure control and cardiac health.;Identified with patient nutritional and/or medication  changes necessary with exercise.;Helped patient identify appropriate exercises in relation to his/her diabetes, diabetes complications and other health issue.    Medications Reviewed patients medication for diabetes, action, purpose, timing of dose and side effects.;Reviewed medication adjustment guidelines for hyperglycemia and sick days.    Monitoring Identified appropriate SMBG and/or A1C goals.;Daily foot exams;Yearly dilated eye exam    Acute complications Taught prevention, symptoms, and  treatment of hypoglycemia - the 15 rule.;Discussed and identified patients' prevention, symptoms, and treatment of hyperglycemia.;Educated on sick day management    Chronic complications Relationship between chronic complications and blood glucose control;Assessed and discussed foot care and prevention of foot problems;Lipid levels, blood glucose control and heart disease;Identified and discussed with patient  current chronic complications;Dental care;Retinopathy and reason for yearly dilated eye exams;Nephropathy, what  it is, prevention of, the use of ACE, ARB's and early detection of through urine microalbumia.;Reviewed with patient heart disease, higher risk of, and prevention    Diabetes Stress and Support Identified and addressed patients feelings and concerns about diabetes;Worked with patient to identify barriers to care and solutions;Role of stress on diabetes    Lifestyle and Health Coping Lifestyle issues that need to be addressed for better diabetes care      Individualized Goals (developed by patient)   Nutrition General guidelines for healthy choices and portions discussed    Physical Activity Exercise 3-5 times per week;30 minutes per day    Medications take my medication as prescribed    Monitoring  Test my blood glucose as discussed    Problem Solving Eating Pattern;Sleep Pattern    Reducing Risk examine blood glucose patterns;do foot checks daily;treat hypoglycemia with 15 grams of carbs if  blood glucose less than 70mg /dL    Health Coping Ask for help with psychological, social, or emotional issues      Post-Education Assessment   Patient understands the diabetes disease and treatment process. Comprehends key points    Patient understands incorporating nutritional management into lifestyle. Comprehends key points    Patient undertands incorporating physical activity into lifestyle. Comprehends key points    Patient understands using medications safely. Demonstrates understanding / competency    Patient understands monitoring blood glucose, interpreting and using results Comprehends key points    Patient understands prevention, detection, and treatment of acute complications. Comprehends key points    Patient understands prevention, detection, and treatment of chronic complications. Comprehends key points    Patient understands how to develop strategies to address psychosocial issues. Comprehends key points    Patient understands how to develop strategies to promote health/change behavior. Comprehends key points      Outcomes   Expected Outcomes Demonstrated interest in learning. Expect positive outcomes    Future DMSE 2 months    Program Status Not Completed             Individualized Plan for Diabetes Self-Management Training:   Learning Objective:  Patient will have a greater understanding of diabetes self-management. Patient education plan is to attend individual and/or group sessions per assessed needs and concerns.   Plan:   Patient Instructions  Aim for 150 minutes of physical activity weekly. -look up chair workouts on youtube -do music dancing  For breakfast try egg or low sodium peanut butter on toast instead of bacon  Eat more Non-Starchy Vegetables - aim to make 1/2 of your plate vegetables at least 2 times per day.  Minimize added sugars and refined grains. Rethink what you drink. Choose beverages without added sugar. Look for 0 carbs on the label.  Aim to reduce ginger ale intake or quit.   Make simple meals at home more often than eating out.  Reduce sodium intake. Look at recommendations in Heart Failure Nutrition Therapy handout.   Expected Outcomes:  Demonstrated interest in learning. Expect positive outcomes  Education material provided: ADA - How to Thrive: A Guide for Your Journey with Diabetes and My Plate, Nutrition Care Manual: Heart Failure Nutrition Therapy, AHA Healthy Sleep, Balanced Plate Food Groups.   If problems or questions, patient to contact team via:  Phone  Future DSME appointment: 2 months

## 2022-04-17 NOTE — Patient Instructions (Addendum)
Aim for 150 minutes of physical activity weekly. -look up chair workouts on youtube -do music dancing  For breakfast try egg or low sodium peanut butter on toast instead of bacon  Eat more Non-Starchy Vegetables - aim to make 1/2 of your plate vegetables at least 2 times per day.  Minimize added sugars and refined grains. Rethink what you drink. Choose beverages without added sugar. Look for 0 carbs on the label. Aim to reduce ginger ale intake or quit.   Make simple meals at home more often than eating out.  Reduce sodium intake. Look at recommendations in Heart Failure Nutrition Therapy handout.

## 2022-04-18 ENCOUNTER — Ambulatory Visit (HOSPITAL_COMMUNITY): Payer: Medicare Other

## 2022-04-29 ENCOUNTER — Other Ambulatory Visit: Payer: Self-pay | Admitting: Cardiovascular Disease

## 2022-05-09 ENCOUNTER — Other Ambulatory Visit: Payer: Self-pay | Admitting: Family

## 2022-05-14 ENCOUNTER — Telehealth: Payer: Self-pay | Admitting: Family

## 2022-05-14 NOTE — Telephone Encounter (Signed)
Patient called wanting to speak to Tammy regarding her medications. She would like a call back whenever possible.

## 2022-05-14 NOTE — Telephone Encounter (Signed)
Patient is in Medicare coverage gap and cost of Briana Miller is $150. I have screened for medication assistance program in past but patient did not qualify. She has already used [place] 30 day free card - can only use once per lifetime.  Called Briana Miller rep and we will try to get samples in the office in the next week to help patient thru until benefits restart in 2024.

## 2022-05-15 NOTE — Telephone Encounter (Signed)
Patient was notified. She has 6 days of Farxiga left.

## 2022-05-21 ENCOUNTER — Telehealth: Payer: Self-pay | Admitting: Cardiology

## 2022-05-21 NOTE — Telephone Encounter (Signed)
Pt c/o medication issue:  1. Name of Medication:   dapagliflozin propanediol (FARXIGA) 10 MG TABS tablet    2. How are you currently taking this medication (dosage and times per day)?   Take 1 tablet (10 mg total) by mouth daily before breakfast.Patient taking differently: Take 10 mg by mouth daily before breakfast. 1/2 tablet daily    3. Are you having a reaction (difficulty breathing--STAT)? no  4. What is your medication issue? Calling to see if our office has any samples that she can get. Please advise  .

## 2022-05-21 NOTE — Telephone Encounter (Signed)
Spoke to patient .  Office does not have samples. RN   asked patient if she contact primary office. Patient staes primary office stated would try to find her sme medication.  Patient states she is on  medicare - Ozempic has placed her in the [place] " [place]"   RN  recommend patient to contact primary - to see if they would be able start patient assistance for [place].  She verbalized understanding.

## 2022-05-22 ENCOUNTER — Telehealth: Payer: Self-pay | Admitting: Pharmacist

## 2022-05-22 MED ORDER — DAPAGLIFLOZIN PROPANEDIOL 10 MG PO TABS: 10.0000 mg | ORAL_TABLET | Freq: Every day | ORAL | 0 refills | Status: DC

## 2022-05-22 NOTE — Telephone Encounter (Signed)
Patient called to ask about samples of Farxiga 10mg . She has run out and is in medicare coverage gap.  We have screened for medication assistance program but patient does not qualify due to household income.  We just got some samples in yesterday.  Provided patient with #35 tablets of Farxiga 10mg  - take 1 tablet daily.

## 2022-05-23 ENCOUNTER — Other Ambulatory Visit (HOSPITAL_BASED_OUTPATIENT_CLINIC_OR_DEPARTMENT_OTHER): Payer: Self-pay

## 2022-05-23 MED ORDER — COMIRNATY 30 MCG/0.3ML IM SUSY
PREFILLED_SYRINGE | INTRAMUSCULAR | 0 refills | Status: DC
  Filled 2022-05-23: qty 0.3, 1d supply, fill #0

## 2022-05-27 NOTE — Telephone Encounter (Signed)
Medication Samples have been provided to the patient by Henrene Pastor, Clinical Pharmacist.   Drug name: Marcelline Deist       Strength: 10mg         Qty: 5 boxes (35 tablets) LOT:  Exp.Date: 11/13/2024  Dosing instructions: 1 tablet daily  The patient has been instructed regarding the correct time, dose, and frequency of taking this medication, including desired effects and most common side effects.   11/15/2024 4:27 PM 05/27/2022

## 2022-05-30 ENCOUNTER — Ambulatory Visit (HOSPITAL_COMMUNITY): Payer: Medicare Other

## 2022-06-01 ENCOUNTER — Other Ambulatory Visit: Payer: Self-pay | Admitting: Family

## 2022-06-01 ENCOUNTER — Other Ambulatory Visit: Payer: Self-pay | Admitting: Cardiology

## 2022-06-01 DIAGNOSIS — I428 Other cardiomyopathies: Secondary | ICD-10-CM

## 2022-06-18 ENCOUNTER — Ambulatory Visit (INDEPENDENT_AMBULATORY_CARE_PROVIDER_SITE_OTHER): Payer: Medicare Other

## 2022-06-18 DIAGNOSIS — I428 Other cardiomyopathies: Secondary | ICD-10-CM

## 2022-06-19 LAB — CUP PACEART REMOTE DEVICE CHECK
Battery Remaining Longevity: 67 mo
Battery Remaining Percentage: 79 %
Battery Voltage: 3.01 V
Brady Statistic AP VP Percent: 1 %
Brady Statistic AP VS Percent: 1 %
Brady Statistic AS VP Percent: 99 %
Brady Statistic AS VS Percent: 1 %
Brady Statistic RA Percent Paced: 1 %
Date Time Interrogation Session: 20240103040019
HighPow Impedance: 80 Ohm
HighPow Impedance: 80 Ohm
Implantable Lead Connection Status: 753985
Implantable Lead Connection Status: 753985
Implantable Lead Connection Status: 753985
Implantable Lead Implant Date: 20230403
Implantable Lead Implant Date: 20230403
Implantable Lead Implant Date: 20230403
Implantable Lead Location: 753858
Implantable Lead Location: 753859
Implantable Lead Location: 753860
Implantable Pulse Generator Implant Date: 20230403
Lead Channel Impedance Value: 400 Ohm
Lead Channel Impedance Value: 550 Ohm
Lead Channel Impedance Value: 960 Ohm
Lead Channel Pacing Threshold Amplitude: 0.75 V
Lead Channel Pacing Threshold Amplitude: 0.75 V
Lead Channel Pacing Threshold Amplitude: 1.25 V
Lead Channel Pacing Threshold Pulse Width: 0.5 ms
Lead Channel Pacing Threshold Pulse Width: 0.5 ms
Lead Channel Pacing Threshold Pulse Width: 0.5 ms
Lead Channel Sensing Intrinsic Amplitude: 5 mV
Lead Channel Sensing Intrinsic Amplitude: 8.4 mV
Lead Channel Setting Pacing Amplitude: 1.75 V
Lead Channel Setting Pacing Amplitude: 2 V
Lead Channel Setting Pacing Amplitude: 2.5 V
Lead Channel Setting Pacing Pulse Width: 0.5 ms
Lead Channel Setting Pacing Pulse Width: 0.5 ms
Lead Channel Setting Sensing Sensitivity: 0.5 mV
Pulse Gen Serial Number: 9986369
Zone Setting Status: 755011

## 2022-06-23 ENCOUNTER — Other Ambulatory Visit: Payer: Self-pay | Admitting: Gastroenterology

## 2022-06-23 ENCOUNTER — Other Ambulatory Visit: Payer: Self-pay | Admitting: Pharmacist

## 2022-06-23 MED ORDER — DAPAGLIFLOZIN PROPANEDIOL 10 MG PO TABS: 10.0000 mg | ORAL_TABLET | Freq: Every day | ORAL | 2 refills | Status: DC

## 2022-06-23 NOTE — Telephone Encounter (Signed)
This is a patient of Dr Jacobs.  Please advise refills as you are DOD am.  Thank you 

## 2022-06-23 NOTE — Telephone Encounter (Signed)
Yes okay to refill. She should follow up with [place] in the next 6 months or so for routine evaluation. Thanks

## 2022-06-23 NOTE — Telephone Encounter (Signed)
Patient changed her mind and asked for Wilder Glade to be sent to sent to her local pharmacy - [facility].  She states [facility] was trying to fill for 100 days but cost was $121. She would prefer to get 30 days for $43. She can still get at [facility] and it can be delivered to her. Patient declined and asked for Rx to be sent to [facility].  Called [facility] to make sure that they reverse Rx filled 06/21/2022 for 100 days. [facility] tech verified that the 100 day Rx was reversed. [facility] was able to fill 30 days.

## 2022-06-30 ENCOUNTER — Other Ambulatory Visit: Payer: Self-pay | Admitting: Family

## 2022-07-04 ENCOUNTER — Ambulatory Visit: Payer: Medicare Other | Admitting: Dietician

## 2022-07-08 ENCOUNTER — Other Ambulatory Visit: Payer: Self-pay | Admitting: Family

## 2022-07-11 IMAGING — DX DG CHEST 2V
3 series · 3 of 3 positions shown · non-contrast
Comparison: 9071

CLINICAL DATA: Cough

EXAM:
CHEST - 2 VIEW

[chest ap]
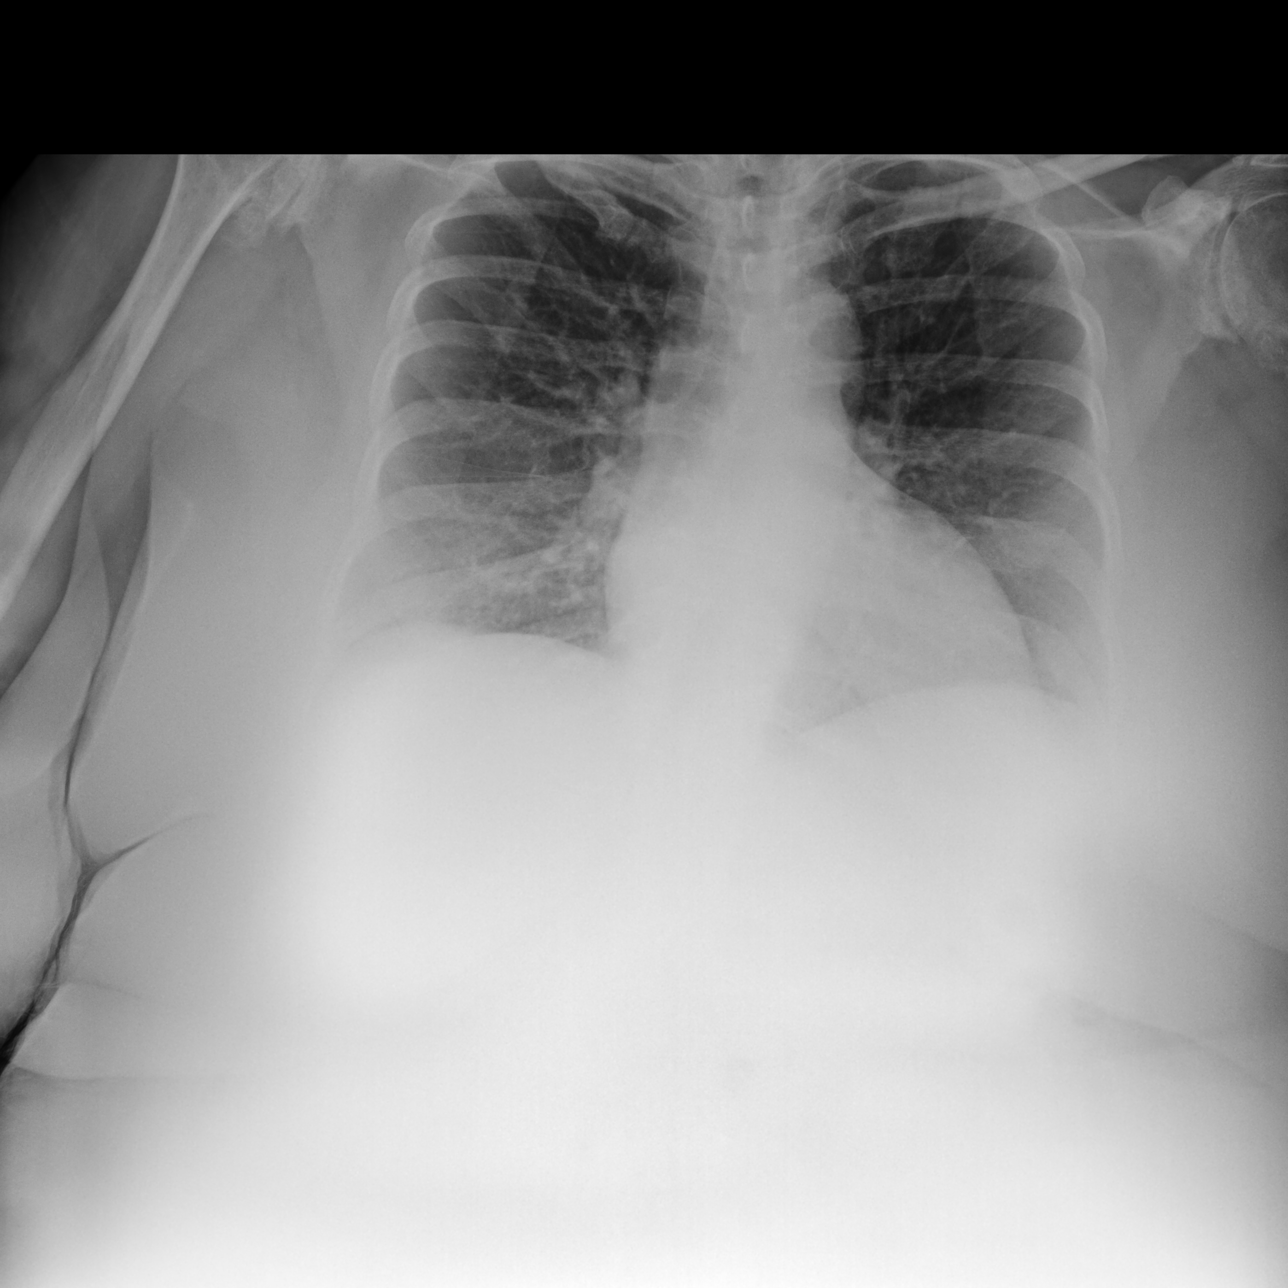

[chest lat (1 of 2)]
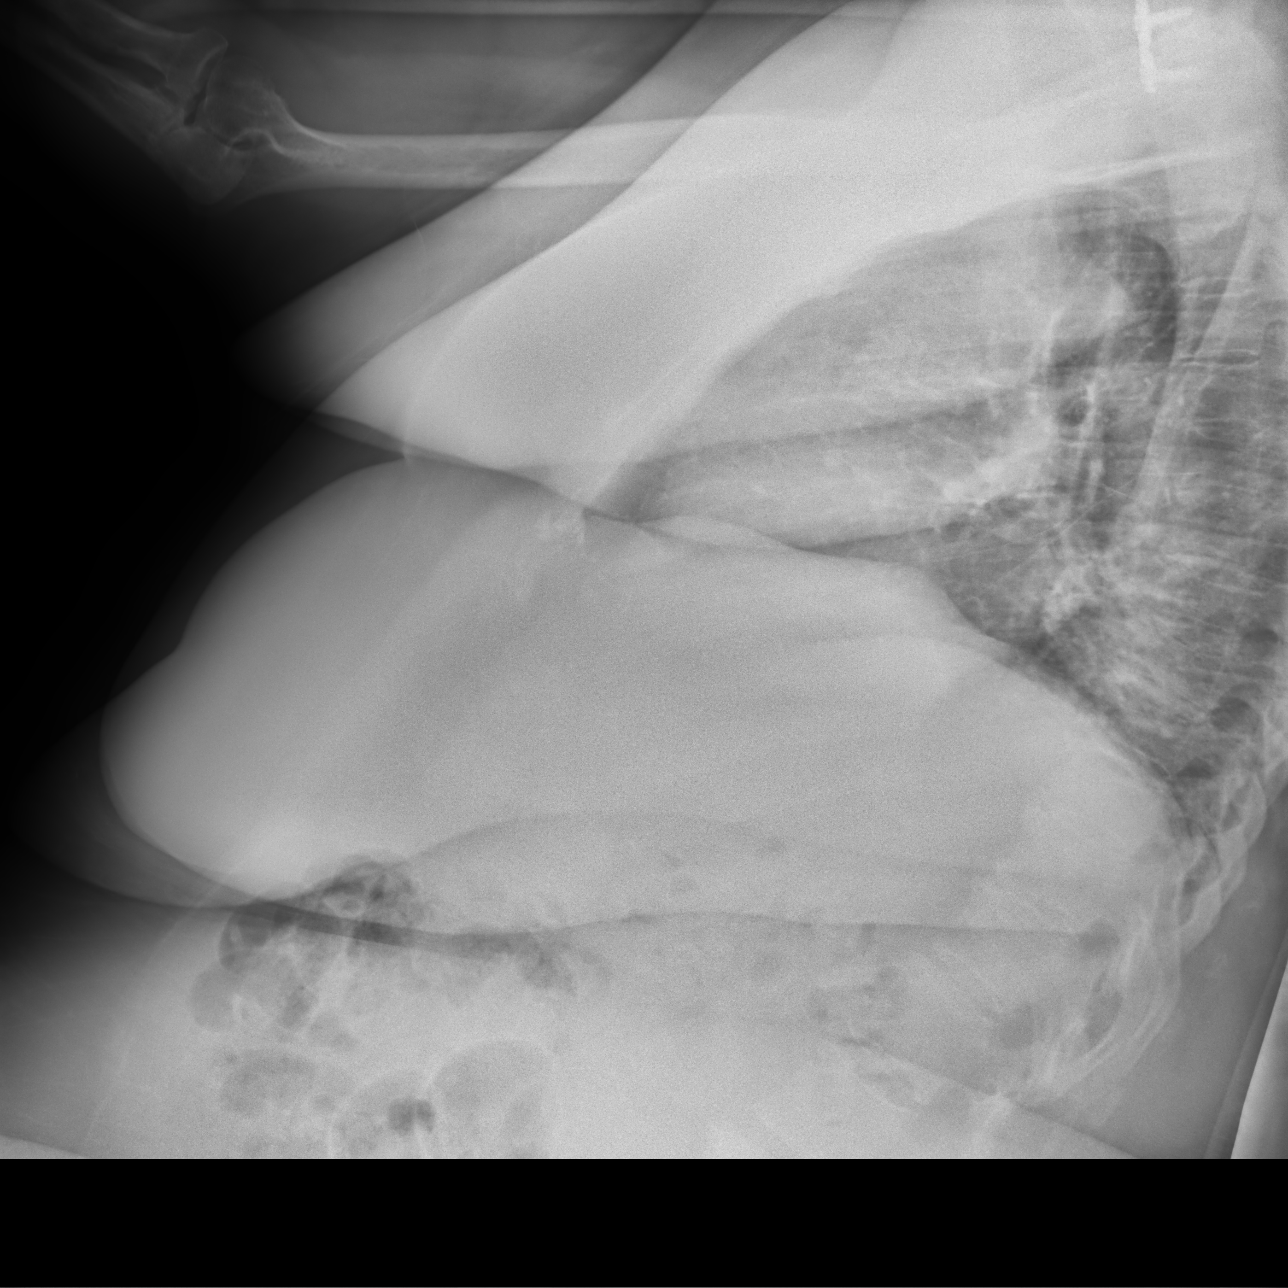

[chest lat (2 of 2)]
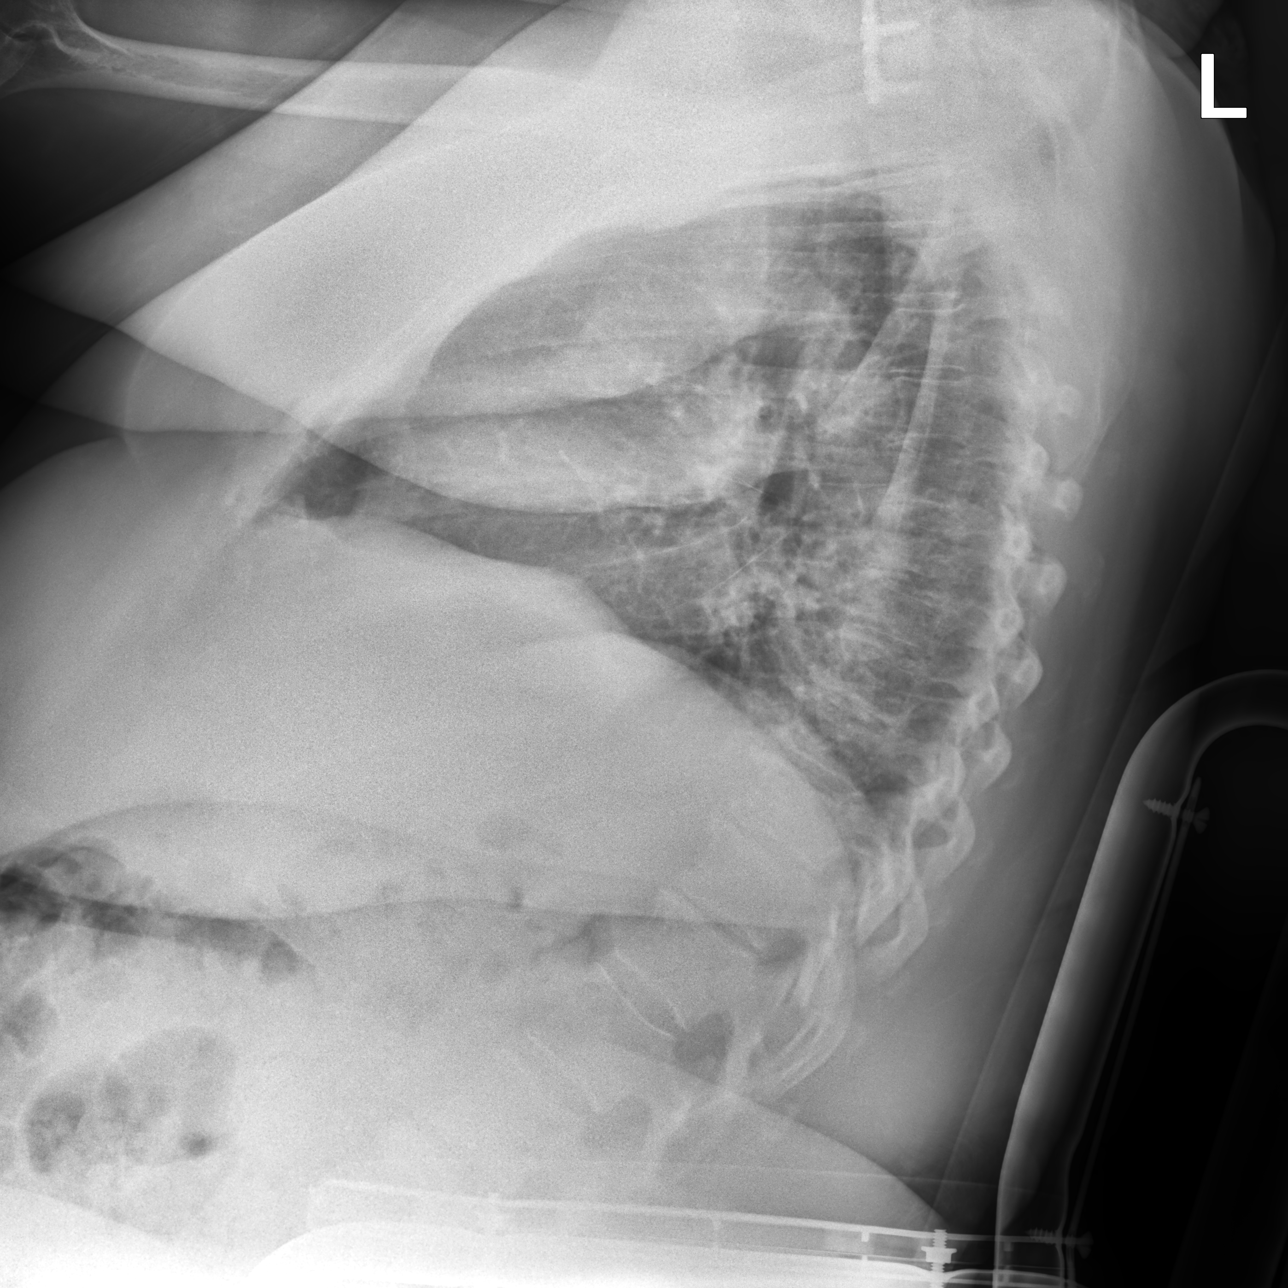

[3 of 3 positions shown; findings below may reference images not displayed]

FINDINGS: Low lung volumes. Increased interstitial prominence since 9071. No
pleural effusion. Cardiomediastinal silhouette is within normal
limits with normal heart size. Lower cervical anterior fusion.
Advanced degenerative changes at the left greater than right
glenohumeral joints.
IMPRESSION: Increased interstitial prominence since 9071. This may reflect
chronic interstitial changes or mild edema.

## 2022-07-14 NOTE — Progress Notes (Signed)
Remote ICD transmission.   

## 2022-07-21 ENCOUNTER — Other Ambulatory Visit: Payer: Self-pay | Admitting: Family

## 2022-08-07 ENCOUNTER — Other Ambulatory Visit: Payer: Self-pay | Admitting: Internal Medicine

## 2022-08-28 ENCOUNTER — Other Ambulatory Visit: Payer: Self-pay | Admitting: Family

## 2022-08-29 ENCOUNTER — Ambulatory Visit: Payer: Medicare Other | Admitting: Family

## 2022-09-04 ENCOUNTER — Other Ambulatory Visit: Payer: Self-pay | Admitting: Family

## 2022-09-08 ENCOUNTER — Other Ambulatory Visit: Payer: Self-pay | Admitting: Internal Medicine

## 2022-09-17 ENCOUNTER — Ambulatory Visit (INDEPENDENT_AMBULATORY_CARE_PROVIDER_SITE_OTHER): Payer: Medicare Other

## 2022-09-17 DIAGNOSIS — I428 Other cardiomyopathies: Secondary | ICD-10-CM

## 2022-09-17 LAB — CUP PACEART REMOTE DEVICE CHECK
Battery Remaining Longevity: 64 mo
Battery Remaining Percentage: 76 %
Battery Voltage: 2.99 V
Brady Statistic AP VP Percent: 1 %
Brady Statistic AP VS Percent: 1 %
Brady Statistic AS VP Percent: 99 %
Brady Statistic AS VS Percent: 1 %
Brady Statistic RA Percent Paced: 1 %
Date Time Interrogation Session: 20240403040016
HighPow Impedance: 83 Ohm
HighPow Impedance: 83 Ohm
Implantable Lead Connection Status: 753985
Implantable Lead Connection Status: 753985
Implantable Lead Connection Status: 753985
Implantable Lead Implant Date: 20230403
Implantable Lead Implant Date: 20230403
Implantable Lead Implant Date: 20230403
Implantable Lead Location: 753858
Implantable Lead Location: 753859
Implantable Lead Location: 753860
Implantable Pulse Generator Implant Date: 20230403
Lead Channel Impedance Value: 390 Ohm
Lead Channel Impedance Value: 490 Ohm
Lead Channel Impedance Value: 940 Ohm
Lead Channel Pacing Threshold Amplitude: 0.75 V
Lead Channel Pacing Threshold Amplitude: 0.75 V
Lead Channel Pacing Threshold Amplitude: 1.25 V
Lead Channel Pacing Threshold Pulse Width: 0.5 ms
Lead Channel Pacing Threshold Pulse Width: 0.5 ms
Lead Channel Pacing Threshold Pulse Width: 0.5 ms
Lead Channel Sensing Intrinsic Amplitude: 4.8 mV
Lead Channel Sensing Intrinsic Amplitude: 5 mV
Lead Channel Setting Pacing Amplitude: 1.75 V
Lead Channel Setting Pacing Amplitude: 2 V
Lead Channel Setting Pacing Amplitude: 2.5 V
Lead Channel Setting Pacing Pulse Width: 0.5 ms
Lead Channel Setting Pacing Pulse Width: 0.5 ms
Lead Channel Setting Sensing Sensitivity: 0.5 mV
Pulse Gen Serial Number: 9986369
Zone Setting Status: 755011

## 2022-09-24 NOTE — Progress Notes (Signed)
Subjective:   By signing my name below, I, Isabelle CourseRachel Rivera, attest that this documentation has been prepared under the direction and in the presence of Lemont FillersMelissa S O'Sullivan, NP 09/26/22   Patient ID: Briana CanBrenda Dunbar Miller, female    DOB: April 25, 1956, 67 y.o.   MRN: [id number]  Chief Complaint  Patient presents with   Diabetes    Here for follow up   Allergies    Complains of seasonal allergies    HPI Patient is in today for an office visit.   Headaches: She has been having headaches at an increased frequency lately. She notes her headaches are bad enough to make her need to go to bed. She was previously on Amitriptyline to prevent headaches but stopped taking it over a year ago. She is interested in restarting it.   Syncope: She reports 3 syncopal episodes in the last 6 months. She notes having a severe headache and lying in bed prior to each episode. She has been alone each during episode and reports about 30-45 minutes of unaccounted time. When regaining consciousness she tends to be unsure of what happened. She denies any loss of urinary control or tongue biting during these episodes.   Allergies: She endorses a productive cough in the mornings which she attributes to her seasonal allergies. She has been taking Benadryl, Flonase, and an off brand OTC pink allergy pill once daily.   Diabetes:  Her diabetes is overall well-controlled. She currently is not taking Ozempic due to being in the donut hole. She continues to take 10 mg [place]xiga daily.   Vision:  She is UTD on routine vision exams. Her next visit is next month.  Past Medical History:  Diagnosis Date   Acute systolic congestive heart failure 03/01/2014   Allergy    Anemia    Arthritis    Cardiomyopathy    Cervical myelopathy    CHF (congestive heart failure)    GERD (gastroesophageal reflux disease)    Gout    Heart disease    Hyperlipidemia    Hypertension    Leg pain    Overactive bladder     Past Surgical  History:  Procedure Laterality Date   ANTERIOR CERVICAL DECOMP/DISCECTOMY FUSION N/A 08/03/2016   Procedure: ANTERIOR CERVICAL DECOMPRESSION/DISCECTOMY FUSION C4-5, C5-6;  Surgeon: Barnett AbuHenry Elsner, MD;  [facility];  Service: Neurosurgery;  Laterality: N/A;   BIV ICD INSERTION CRT-D N/A 09/16/2021   Procedure: BIV ICD INSERTION CRT-D;  Surgeon: Marinus Mawaylor, Gregg W, MD;  [facility];  Service: Cardiovascular;  Laterality: N/A;   COLONOSCOPY     COLONOSCOPY WITH PROPOFOL N/A 02/24/2019   Procedure: COLONOSCOPY WITH PROPOFOL;  Surgeon: Rachael FeeJacobs, Daniel P, MD;  Location: [facility] ENDOSCOPY;  Service: Endoscopy;  Laterality: N/A;   ESOPHAGOGASTRODUODENOSCOPY (EGD) WITH PROPOFOL N/A 04/25/2021   Procedure: ESOPHAGOGASTRODUODENOSCOPY (EGD) WITH PROPOFOL;  Surgeon: Rachael FeeJacobs, Daniel P, MD;  Location: [facility]COPY;  Service: Endoscopy;  Laterality: N/A;   KNEE ARTHROSCOPY     2007   ROTATOR CUFF REPAIR Right 03/23/13   TOTAL KNEE ARTHROPLASTY  09/22/2011   Procedure: TOTAL KNEE ARTHROPLASTY;  Surgeon: Raymon MuttonStephen D Lucey, MD;  Location: [facility] OR;  Service: Orthopedics;  Laterality: Right;    Family History  Problem Relation Age of Onset   Cancer Mother        colon 560 and breast 1654- deceased   Colon cancer Mother    Diabetes Father        living   Asthma Father  Hypertension Father    Heart attack Father 66       medical management per pt   Glaucoma Father        had eye transplant   Kidney failure Father        acute renal failure- died 53   Esophageal cancer Neg Hx    Stomach cancer Neg Hx    Rectal cancer Neg Hx     Social History   Socioeconomic History   Marital status: Married    Spouse name: Not on file   Number of children: 2   Years of education: HS   Highest education level: Some college, no degree  Occupational History   Occupation: Acupuncturist: [employer]  Tobacco Use   Smoking status: Never   Smokeless tobacco: Never  Vaping Use   Vaping Use: Never used   Substance and Sexual Activity   Alcohol use: Not Currently    Alcohol/week: 2.0 standard drinks of alcohol    Types: 2 Glasses of wine per week   Drug use: No   Sexual activity: Not on file  Other Topics Concern   Not on file  Social History Narrative   Married- 32 years   2 children- grown daughter- lives in [place]- has 2 children   Grown son lives in [place]- 4 children   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   1-2 cups caffeine daily.   Lives at home with her husband.   Social Determinants of Health   Financial Resource Strain: Medium Risk (09/26/2022)   Overall Financial Resource Strain (CARDIA)    Difficulty of Paying Living Expenses: Somewhat hard  Food Insecurity: No Food Insecurity (09/26/2022)   Hunger Vital Sign    Worried About Running Out of Food in the Last Year: Never true    Ran Out of Food in the Last Year: Never true  Transportation Needs: No Transportation Needs (09/26/2022)   PRAPARE - Administrator, Civil Service (Medical): No    Lack of Transportation (Non-Medical): No  Physical Activity: Unknown (09/26/2022)   Exercise Vital Sign    Days of Exercise per Week: 0 days    Minutes of Exercise per Session: Not on file  Stress: No Stress Concern Present (09/26/2022)   [employer] of Occupational Health - Occupational Stress Questionnaire    Feeling of Stress : Only a little  Social Connections: Moderately Integrated (09/26/2022)   Social Connection and Isolation Panel [NHANES]    Frequency of Communication with Friends and Family: More than three times a week    Frequency of Social Gatherings with Friends and Family: Patient declined    Attends Religious Services: 1 to 4 times per year    Active Member of [employer] or Organizations: No    Attends Engineer, structural: Not on file    Marital Status: Married  Catering manager  Violence: Not on file    Outpatient Medications Prior to Visit  Medication Sig Dispense Refill   ACCU-CHEK GUIDE test strip USE UP TO 4 TIMES DAILY 400 strip 3   Accu-Chek Softclix Lancets lancets USE UP TO 4 TIMES DAILY 400 each 3   acetaminophen (TYLENOL) 500 MG tablet Take 1,000 mg by mouth every 8 (eight) hours as needed for moderate pain or headache.     albuterol (VENTOLIN HFA) 108 (90 Base) MCG/ACT inhaler  USE 2 INHALATIONS BY MOUTH EVERY 6 HOURS AS NEEDED 34 g 2   allopurinol (ZYLOPRIM) 100 MG tablet TAKE 1 TABLET BY MOUTH DAILY 100 tablet 1   Blood Glucose Monitoring Suppl (ACCU-CHEK GUIDE) w/Device KIT USE AS DIRECTED 1 kit 0   carvedilol (COREG) 12.5 MG tablet TAKE 1 TABLET BY MOUTH  TWICE DAILY 200 tablet 2   celecoxib (CELEBREX) 100 MG capsule Take 100 mg by mouth daily as needed for pain.     cholecalciferol (VITAMIN D3) 25 MCG (1000 UNIT) tablet Take 1,000 Units by mouth daily.     COVID-19 mRNA vaccine 2023-2024 (COMIRNATY) syringe Inject into the muscle. 0.3 mL 0   dapagliflozin propanediol (FARXIGA) 10 MG TABS tablet TAKE 1 TABLET(10 MG) BY MOUTH DAILY BEFORE BREAKFAST 90 tablet 0   famotidine (PEPCID) 40 MG tablet TAKE 1 TABLET BY MOUTH TWICE  DAILY 200 tablet 2   fluticasone (FLONASE) 50 MCG/ACT nasal spray Place 2 sprays into both nostrils daily. (Patient taking differently: Place 2 sprays into both nostrils daily as needed for allergies.) 16 g 1   gabapentin (NEURONTIN) 300 MG capsule TAKE 3 CAPSULES BY MOUTH 3 TIMES DAILY 900 capsule 2   latanoprost (XALATAN) 0.005 % ophthalmic solution Place 1 drop into both eyes at bedtime.   11   losartan (COZAAR) 25 MG tablet TAKE 1 TABLET BY MOUTH DAILY 100 tablet 2   nystatin (NYAMYC) powder APPLY TOPICALLY TO THE AFFECTED  AREA 3 TIMES DAILY AS NEEDED FOR RASH 240 g 1   ondansetron (ZOFRAN) 4 MG tablet TAKE 1 TABLET BY MOUTH EVERY 8  HOURS AS NEEDED FOR NAUSEA AND  VOMITING 90 tablet 0   pantoprazole (PROTONIX) 40 MG tablet TAKE 1  TABLET BY MOUTH TWICE  DAILY 200 tablet 2   potassium chloride SA (KLOR-CON M) 20 MEQ tablet TAKE 1 TABLET BY MOUTH DAILY 100 tablet 1   rosuvastatin (CRESTOR) 10 MG tablet Take 1 tablet (10 mg total) by mouth daily. For cholesterol and heart. 90 tablet 3   scopolamine (TRANSDERM-SCOP) 1 MG/3DAYS Place 1 patch (1.5 mg total) onto the skin every 3 (three) days. 3 patch 0   Semaglutide,0.25 or 0.5MG /DOS, (OZEMPIC, 0.25 OR 0.5 MG/DOSE,) 2 MG/3ML SOPN Inject 0.5 mg into the skin once a week. 6 mL 0   spironolactone (ALDACTONE) 25 MG tablet TAKE 1 TABLET BY MOUTH DAILY 90 tablet 3   Tart Cherry 1200 MG CAPS Take 1,200 mg by mouth 2 (two) times a week. (Patient not taking: Reported on 04/11/2022)     timolol (BETIMOL) 0.5 % ophthalmic solution Place 1 drop into both eyes every morning.      torsemide (DEMADEX) 20 MG tablet TAKE 2 TABLETS BY MOUTH  ALTERNATING WITH 1 TABLET BY  MOUTH EVERY OTHER DAY 135 tablet 3   traZODone (DESYREL) 50 MG tablet 1/2-1 tablet by mouth at bedtime as needed for sleep. 30 tablet 0   No facility-administered medications prior to visit.    Allergies  Allergen Reactions   Entresto [Sacubitril-Valsartan] Other (See Comments)    "Dry mouth"    Review of Systems  Neurological:  Positive for loss of consciousness (3 episode in the last 6 months) and headaches.  Endo/Heme/Allergies:  Positive for environmental allergies.       Objective:    Physical Exam Constitutional:      General: She is not in acute distress.    Appearance: Normal appearance. She is well-developed.  HENT:     Head: Normocephalic  and atraumatic.     Right Ear: External ear normal.     Left Ear: External ear normal.  Eyes:     General: No scleral icterus. Neck:     Thyroid: No thyromegaly.  Cardiovascular:     Rate and Rhythm: Normal rate and regular rhythm.     Pulses:          Dorsalis pedis pulses are 2+ on the right side and 2+ on the left side.       Posterior tibial pulses are 2+ on  the right side and 2+ on the left side.     Heart sounds: Normal heart sounds. No murmur heard. Pulmonary:     Effort: Pulmonary effort is normal. No respiratory distress.     Breath sounds: Normal breath sounds. No wheezing.  Chest:     Comments: Hypopigmentation left breast and otherwise normal Musculoskeletal:     Cervical back: Neck supple.     Right lower leg: 2+ Edema present.     Left lower leg: 2+ Edema present.  Skin:    General: Skin is warm and dry.  Neurological:     Mental Status: She is alert and oriented to person, place, and time.  Psychiatric:        Mood and Affect: Mood normal.        Behavior: Behavior normal.        Thought Content: Thought content normal.        Judgment: Judgment normal.     BP 131/61 (BP Location: Left Arm, Patient Position: Sitting, Cuff Size: Large)   Pulse 72   Temp 97.6 F (36.4 C) (Oral)   Resp 16   LMP 06/17/2007   SpO2 99%  Wt Readings from Last 3 Encounters:  12/11/21 277 lb (125.6 kg)  10/17/21 225 lb (102.1 kg)  09/16/21 220 lb (99.8 kg)       Assessment & Plan:  Controlled type 2 diabetes mellitus without complication, without long-term current use of insulin Assessment & Plan: She is not taking the ozempic regularly due to cost. She is favoring farxiga which I think is a good idea given her cardiac hx.  Last A1C was at goal.   Orders: -     Hemoglobin A1c -     Comprehensive metabolic panel -     Microalbumin / creatinine urine ratio  Hyperlipidemia, unspecified hyperlipidemia type Assessment & Plan: Pt is overdue for follow up lipid panel. Will obtain. Continue statin.   Orders: -     Lipid panel  Syncope, unspecified syncope type Assessment & Plan: 3 reported episodes unwitnessed.  Possible causes could be cardiac, CVA, seizure.  Will obtain CT head to rule out CVA. Refer to neuro due to HA and concern of seizure.  EKG tracing is personally reviewed.  EKG notes NSR (paced) No acute changes.    Orders: -      ECHOCARDIOGRAM COMPLETE; Future -     Ambulatory referral to Neurology -     EKG 12-Lead -     CT HEAD WO CONTRAST ( ); Future  Nonintractable headache, unspecified chronicity pattern, unspecified headache type -     Ambulatory referral to Neurology -     CT HEAD WO CONTRAST ( ); Future  Screening for cervical cancer -     Ambulatory referral to Obstetrics / Gynecology  Allergic rhinitis, unspecified seasonality, unspecified trigger Assessment & Plan: Uncontrolled despite otc generic antihistamine and flonase. Will give trial of singulair.    Other  migraine without status migrainosus, not intractable Assessment & Plan: Reports that her HA's were less frequent in the past when she was on elavil. Will resume.  D/c trazodone due to increased risk of serotonin syndrom.    Other orders -     Montelukast Sodium; Take 1 tablet (10 mg total) by mouth at bedtime.  Dispense: 90 tablet; Refill: 1 -     Amitriptyline HCl; Take 1 tablet (25 mg total) by mouth at bedtime.  Dispense: 90 tablet; Refill: 1     I,Rachel Rivera,acting as a scribe for Lemont Fillers, NP.,have documented all relevant documentation on the behalf of Lemont Fillers, NP,as directed by  Lemont Fillers, NP while in the presence of Lemont Fillers, NP.   I, Lemont Fillers, NP, personally preformed the services described in this documentation.  All medical record entries made by the scribe were at my direction and in my presence.  I have reviewed the chart and discharge instructions (if applicable) and agree that the record reflects my personal performance and is accurate and complete. 09/26/22   Lemont Fillers, NP

## 2022-09-26 ENCOUNTER — Ambulatory Visit (INDEPENDENT_AMBULATORY_CARE_PROVIDER_SITE_OTHER): Payer: Medicare Other | Admitting: Family

## 2022-09-26 VITALS — BP 131/61 | HR 72 | Temp 97.6°F | Resp 16

## 2022-09-26 DIAGNOSIS — R519 Headache, unspecified: Secondary | ICD-10-CM | POA: Diagnosis not present

## 2022-09-26 DIAGNOSIS — J309 Allergic rhinitis, unspecified: Secondary | ICD-10-CM | POA: Diagnosis not present

## 2022-09-26 DIAGNOSIS — G43809 Other migraine, not intractable, without status migrainosus: Secondary | ICD-10-CM

## 2022-09-26 DIAGNOSIS — E119 Type 2 diabetes mellitus without complications: Secondary | ICD-10-CM | POA: Diagnosis not present

## 2022-09-26 DIAGNOSIS — Z124 Encounter for screening for malignant neoplasm of cervix: Secondary | ICD-10-CM

## 2022-09-26 DIAGNOSIS — R55 Syncope and collapse: Secondary | ICD-10-CM | POA: Diagnosis not present

## 2022-09-26 DIAGNOSIS — E785 Hyperlipidemia, unspecified: Secondary | ICD-10-CM

## 2022-09-26 LAB — LIPID PANEL
Cholesterol: 199 mg/dL (ref 0–200)
HDL: 62.3 mg/dL (ref >39.00)
LDL Cholesterol: 114 mg/dL — ABNORMAL HIGH (ref 0–99)
NonHDL: 136.63
Total CHOL/HDL Ratio: 3
Triglycerides: 112 mg/dL (ref 0.0–149.0)
VLDL: 22.4 mg/dL (ref 0.0–40.0)

## 2022-09-26 LAB — COMPREHENSIVE METABOLIC PANEL
ALT: 11 U/L (ref 0–35)
AST: 14 U/L (ref 0–37)
Albumin: 4.4 g/dL (ref 3.5–5.2)
Alkaline Phosphatase: 93 U/L (ref 39–117)
BUN: 14 mg/dL (ref 6–23)
CO2: 31 mEq/L (ref 19–32)
Calcium: 9.3 mg/dL (ref 8.4–10.5)
Chloride: 101 mEq/L (ref 96–112)
Creatinine, Ser: 1.04 mg/dL (ref 0.40–1.20)
GFR: 56.71 mL/min — ABNORMAL LOW (ref >60.00)
Glucose, Bld: 105 mg/dL — ABNORMAL HIGH (ref 70–99)
Potassium: 4 mEq/L (ref 3.5–5.1)
Sodium: 141 mEq/L (ref 135–145)
Total Bilirubin: 0.5 mg/dL (ref 0.2–1.2)
Total Protein: 7.4 g/dL (ref 6.0–8.3)

## 2022-09-26 LAB — HEMOGLOBIN A1C: Hgb A1c MFr Bld: 6.8 % — ABNORMAL HIGH (ref 4.6–6.5)

## 2022-09-26 LAB — MICROALBUMIN / CREATININE URINE RATIO
Creatinine,U: 104.6 mg/dL
Microalb Creat Ratio: 0.7 mg/g (ref 0.0–30.0)
Microalb, Ur: <0.7 mg/dL (ref 0.0–1.9)

## 2022-09-26 MED ORDER — AMITRIPTYLINE HCL 25 MG PO TABS: 25.0000 mg | ORAL_TABLET | Freq: Every day | ORAL | 1 refills | Status: DC

## 2022-09-26 MED ORDER — MONTELUKAST SODIUM 10 MG PO TABS: 10.0000 mg | ORAL_TABLET | Freq: Every day | ORAL | 1 refills | Status: DC

## 2022-09-26 NOTE — Assessment & Plan Note (Signed)
She is not taking the ozempic regularly due to cost. She is favoring farxiga which I think is a good idea given her cardiac hx.  Last A1C was at goal.

## 2022-09-26 NOTE — Assessment & Plan Note (Signed)
Reports that her HA's were less frequent in the past when she was on elavil. Will resume.  D/c trazodone due to increased risk of serotonin syndrom.

## 2022-09-26 NOTE — Assessment & Plan Note (Signed)
Uncontrolled despite otc generic antihistamine and flonase. Will give trial of singulair.

## 2022-09-26 NOTE — Assessment & Plan Note (Signed)
Pt is overdue for follow up lipid panel. Will obtain. Continue statin.

## 2022-09-26 NOTE — Assessment & Plan Note (Signed)
3 reported episodes unwitnessed.  Possible causes could be cardiac, CVA, seizure.  Will obtain CT head to rule out CVA. Refer to neuro due to HA and concern of seizure.  EKG tracing is personally reviewed.  EKG notes NSR (paced) No acute changes.

## 2022-09-29 ENCOUNTER — Telehealth: Payer: Self-pay

## 2022-09-29 ENCOUNTER — Telehealth (HOSPITAL_BASED_OUTPATIENT_CLINIC_OR_DEPARTMENT_OTHER): Payer: Self-pay

## 2022-09-29 NOTE — Telephone Encounter (Signed)
Called patient to schedule their New Physical in June. Left our contact information for them call back.

## 2022-09-30 ENCOUNTER — Other Ambulatory Visit: Payer: Self-pay | Admitting: Family

## 2022-10-02 ENCOUNTER — Telehealth (HOSPITAL_BASED_OUTPATIENT_CLINIC_OR_DEPARTMENT_OTHER): Payer: Self-pay

## 2022-10-06 ENCOUNTER — Telehealth (HOSPITAL_BASED_OUTPATIENT_CLINIC_OR_DEPARTMENT_OTHER): Payer: Self-pay

## 2022-10-10 ENCOUNTER — Other Ambulatory Visit: Payer: Self-pay | Admitting: Family

## 2022-10-10 ENCOUNTER — Encounter: Payer: Self-pay | Admitting: Family

## 2022-10-10 MED ORDER — OZEMPIC (0.25 OR 0.5 MG/DOSE) 2 MG/3ML ~~LOC~~ SOPN: 0.2500 mg | PEN_INJECTOR | SUBCUTANEOUS | 2 refills | Status: AC

## 2022-10-10 MED ORDER — ONDANSETRON HCL 4 MG PO TABS: 4.0000 mg | ORAL_TABLET | Freq: Three times a day (TID) | ORAL | 2 refills | Status: DC | PRN

## 2022-10-10 NOTE — Progress Notes (Deleted)
HPI: FU cardiomyopathy. Echocardiogram in October of 2014 showed an ejection fraction of 40-45%, mild left ventricular hypertrophy and mild left atrial enlargement. Nuclear study in December of 2014 showed an ejection fraction of 51% and normal perfusion.  Cardiac CTA August 2021 showed calcium score of 0 and no coronary disease. Echocardiogram June 2022 showed ejection fraction 30 to 35%, grade 1 diastolic dysfunction, moderate pulmonary hypertension, mild to moderate mitral regurgitation.  Cardiac MRI February 2023 showed ejection fraction 31%, septal dyskinesis consistent with left bundle branch block and normal RV function.  Had biventricular ICD placed April 2023.  Since last seen   Current Outpatient Medications  Medication Sig Dispense Refill   ACCU-CHEK GUIDE test strip USE UP TO 4 TIMES DAILY 400 strip 3   Accu-Chek Softclix Lancets lancets USE UP TO 4 TIMES DAILY 400 each 3   acetaminophen (TYLENOL) 500 MG tablet Take 1,000 mg by mouth every 8 (eight) hours as needed for moderate pain or headache.     albuterol (VENTOLIN HFA) 108 (90 Base) MCG/ACT inhaler USE 2 INHALATIONS BY MOUTH EVERY 6 HOURS AS NEEDED 34 g 2   allopurinol (ZYLOPRIM) 100 MG tablet TAKE 1 TABLET BY MOUTH DAILY 100 tablet 1   amitriptyline (ELAVIL) 25 MG tablet Take 1 tablet (25 mg total) by mouth at bedtime. 90 tablet 1   Blood Glucose Monitoring Suppl (ACCU-CHEK GUIDE) w/Device KIT USE AS DIRECTED 1 kit 0   carvedilol (COREG) 12.5 MG tablet TAKE 1 TABLET BY MOUTH  TWICE DAILY 200 tablet 2   celecoxib (CELEBREX) 100 MG capsule Take 100 mg by mouth daily as needed for pain.     cholecalciferol (VITAMIN D3) 25 MCG (1000 UNIT) tablet Take 1,000 Units by mouth daily.     COVID-19 mRNA vaccine 2023-2024 (COMIRNATY) syringe Inject into the muscle. 0.3 mL 0   dapagliflozin propanediol (FARXIGA) 10 MG TABS tablet TAKE 1 TABLET(10 MG) BY MOUTH DAILY BEFORE BREAKFAST 90 tablet 0   famotidine (PEPCID) 40 MG tablet TAKE 1  TABLET BY MOUTH TWICE  DAILY 200 tablet 2   fluticasone (FLONASE) 50 MCG/ACT nasal spray Place 2 sprays into both nostrils daily. (Patient taking differently: Place 2 sprays into both nostrils daily as needed for allergies.) 16 g 1   gabapentin (NEURONTIN) 300 MG capsule TAKE 3 CAPSULES BY MOUTH 3 TIMES DAILY 900 capsule 2   latanoprost (XALATAN) 0.005 % ophthalmic solution Place 1 drop into both eyes at bedtime.   11   losartan (COZAAR) 25 MG tablet TAKE 1 TABLET BY MOUTH DAILY 100 tablet 2   montelukast (SINGULAIR) 10 MG tablet Take 1 tablet (10 mg total) by mouth at bedtime. 90 tablet 1   nystatin (NYAMYC) powder APPLY TOPICALLY TO THE AFFECTED  AREA 3 TIMES DAILY AS NEEDED FOR RASH 240 g 1   ondansetron (ZOFRAN) 4 MG tablet TAKE 1 TABLET BY MOUTH EVERY 8  HOURS AS NEEDED FOR NAUSEA AND  VOMITING 90 tablet 0   pantoprazole (PROTONIX) 40 MG tablet TAKE 1 TABLET BY MOUTH TWICE  DAILY 200 tablet 2   potassium chloride SA (KLOR-CON M) 20 MEQ tablet TAKE 1 TABLET BY MOUTH DAILY 100 tablet 1   rosuvastatin (CRESTOR) 10 MG tablet Take 1 tablet (10 mg total) by mouth daily. For cholesterol and heart. 90 tablet 3   scopolamine (TRANSDERM-SCOP) 1 MG/3DAYS Place 1 patch (1.5 mg total) onto the skin every 3 (three) days. 3 patch 0   Semaglutide,0.25 or 0.5MG /DOS, (OZEMPIC, 0.25  OR 0.5 MG/DOSE,) 2 MG/3ML SOPN Inject 0.25 mg into the skin once a week for 4 doses. 3 mL 2   spironolactone (ALDACTONE) 25 MG tablet TAKE 1 TABLET BY MOUTH DAILY 90 tablet 3   Tart Cherry 1200 MG CAPS Take 1,200 mg by mouth 2 (two) times a week. (Patient not taking: Reported on 04/11/2022)     timolol (BETIMOL) 0.5 % ophthalmic solution Place 1 drop into both eyes every morning.      torsemide (DEMADEX) 20 MG tablet TAKE 2 TABLETS BY MOUTH  ALTERNATING WITH 1 TABLET BY  MOUTH EVERY OTHER DAY 135 tablet 3   No current facility-administered medications for this visit.     Past Medical History:  Diagnosis Date   Acute systolic  congestive heart failure (HCC) 03/01/2014   Allergy    Anemia    Arthritis    Cardiomyopathy (HCC)    Cervical myelopathy (HCC)    CHF (congestive heart failure) (HCC)    GERD (gastroesophageal reflux disease)    Gout    Heart disease    Hyperlipidemia    Hypertension    Leg pain    Overactive bladder     Past Surgical History:  Procedure Laterality Date   ANTERIOR CERVICAL DECOMP/DISCECTOMY FUSION N/A 08/03/2016   Procedure: ANTERIOR CERVICAL DECOMPRESSION/DISCECTOMY FUSION C4-5, C5-6;  Surgeon: Barnett Abu, MD;  Location[facility] OR;  Service: Neurosurgery;  Laterality: N/A;   BIV ICD INSERTION CRT-D N/A 09/16/2021   Procedure: BIV ICD INSERTION CRT-D;  Surgeon: Marinus Maw, MD;  Location: [facility] INVASIVE CV LAB;  Service: Cardiovascular;  Laterality: N/A;   COLONOSCOPY     COLONOSCOPY WITH PROPOFOL N/A 02/24/2019   Procedure: COLONOSCOPY WITH PROPOFOL;  Surgeon: Rachael Fee, MD;  Location: [facility]COPY;  Service: Endoscopy;  Laterality: N/A;   ESOPHAGOGASTRODUODENOSCOPY (EGD) WITH PROPOFOL N/A 04/25/2021   Procedure: ESOPHAGOGASTRODUODENOSCOPY (EGD) WITH PROPOFOL;  Surgeon: Rachael Fee, MD;  Location: [facility];  Service: Endoscopy;  Laterality: N/A;   KNEE ARTHROSCOPY     2007   ROTATOR CUFF REPAIR Right 03/23/13   TOTAL KNEE ARTHROPLASTY  09/22/2011   Procedure: TOTAL KNEE ARTHROPLASTY;  Surgeon: Raymon Mutton, MD;  Location: [facility] OR;  Service: Orthopedics;  Laterality: Right;    Social History   Socioeconomic History   Marital status: Married    Spouse name: Not on file   Number of children: 2   Years of education: HS   Highest education level: Some college, no degree  Occupational History   Occupation: Acupuncturist: [employer]  Tobacco Use   Smoking status: Never   Smokeless tobacco: Never  Vaping Use   Vaping Use: Never used  Substance and Sexual Activity   Alcohol use: Not Currently    Alcohol/week: 2.0 standard drinks of alcohol    Types: 2  Glasses of wine per week   Drug use: No   Sexual activity: Not on file  Other Topics Concern   Not on file  Social History Narrative   Married- 32 years   2 children- grown daughter- lives in [place]- has 2 children   Grown son lives in [place]- 4 children   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   1-2 cups caffeine daily.   Lives at home with her husband.   Social Determinants of Health   Financial Resource Strain:  Medium Risk (09/26/2022)   Overall Financial Resource Strain (CARDIA)    Difficulty of Paying Living Expenses: Somewhat hard  Food Insecurity: No Food Insecurity (09/26/2022)   Hunger Vital Sign    Worried About Running Out of Food in the Last Year: Never true    Ran Out of Food in the Last Year: Never true  Transportation Needs: No Transportation Needs (09/26/2022)   PRAPARE - Administrator, Civil Service (Medical): No    Lack of Transportation (Non-Medical): No  Physical Activity: Unknown (09/26/2022)   Exercise Vital Sign    Days of Exercise per Week: 0 days    Minutes of Exercise per Session: Not on file  Stress: No Stress Concern Present (09/26/2022)   [employer] of Occupational Health - Occupational Stress Questionnaire    Feeling of Stress : Only a little  Social Connections: Moderately Integrated (09/26/2022)   Social Connection and Isolation Panel [NHANES]    Frequency of Communication with Friends and Family: More than three times a week    Frequency of Social Gatherings with Friends and Family: Patient declined    Attends Religious Services: 1 to 4 times per year    Active Member of [employer] or Organizations: No    Attends Engineer, structural: Not on file    Marital Status: Married  Catering manager Violence: Not on file    Family History  Problem Relation Age of Onset   Cancer Mother        colon 42 and breast 25-  deceased   Colon cancer Mother    Diabetes Father        living   Asthma Father    Hypertension Father    Heart attack Father 73       medical management per pt   Glaucoma Father        had eye transplant   Kidney failure Father        acute renal failure- died 54   Esophageal cancer Neg Hx    Stomach cancer Neg Hx    Rectal cancer Neg Hx     ROS: no fevers or chills, productive cough, hemoptysis, dysphasia, odynophagia, melena, hematochezia, dysuria, hematuria, rash, seizure activity, orthopnea, PND, pedal edema, claudication. Remaining systems are negative.  Physical Exam: Well-developed well-nourished in no acute distress.  Skin is warm and dry.  HEENT is normal.  Neck is supple.  Chest is clear to auscultation with normal expansion.  Cardiovascular exam is regular rate and rhythm.  Abdominal exam nontender or distended. No masses palpated. Extremities show no edema. neuro grossly intact  ECG- personally reviewed  A/P  1 nonischemic cardiomyopathy-continue Entresto, spironolactone and carvedilol. Will repeat echo.   2 chronic combined systolic/diastolic congestive heart failure-patient doing well from a symptomatic standpoint.  Will continue diuretic at present dose.  Add Farxiga 10 mg daily.  Check potassium and renal function in 1 week.  3 hypertension-blood pressure controlled.  Continue present medications.  4 morbid obesity-we discussed the importance of diet, exercise and weight loss.  5 obstructive sleep apnea-Per pulmonary.  6 CRT-D-followed by Dr. Ladona Ridgel.  Olga Millers, MD

## 2022-10-17 ENCOUNTER — Ambulatory Visit: Payer: Medicare Other | Admitting: Cardiology

## 2022-10-24 NOTE — Progress Notes (Signed)
Remote ICD transmission.   

## 2022-11-11 ENCOUNTER — Telehealth: Payer: Self-pay | Admitting: Family

## 2022-11-11 NOTE — Telephone Encounter (Addendum)
  Please call Dr. Dione Booze to request a copy of DM eye

## 2022-11-11 NOTE — Telephone Encounter (Signed)
Electronic request sent 

## 2022-11-17 ENCOUNTER — Other Ambulatory Visit: Payer: Self-pay | Admitting: Family

## 2022-11-20 ENCOUNTER — Ambulatory Visit (HOSPITAL_BASED_OUTPATIENT_CLINIC_OR_DEPARTMENT_OTHER): Payer: Medicare Other

## 2022-11-22 ENCOUNTER — Encounter: Payer: Self-pay | Admitting: Family

## 2022-11-23 MED ORDER — ONDANSETRON HCL 4 MG PO TABS: 4.0000 mg | ORAL_TABLET | Freq: Three times a day (TID) | ORAL | 2 refills | Status: DC | PRN

## 2022-12-04 ENCOUNTER — Other Ambulatory Visit: Payer: Self-pay | Admitting: Family

## 2022-12-05 ENCOUNTER — Telehealth: Payer: Self-pay | Admitting: Family

## 2022-12-05 DIAGNOSIS — Z0000 Encounter for general adult medical examination without abnormal findings: Secondary | ICD-10-CM

## 2022-12-05 NOTE — Telephone Encounter (Signed)
Pt called requesting a referral to be set to the following office and provider:  Oneita Kras, MD [facility] [address], [address] [place], [place] [address] [phone]  Advised pt that OB/GYN is typically self referral and advised that they follow up with their office to get scheduled. Pt stated that she had called and the office had told her a referral was required.

## 2022-12-05 NOTE — Addendum Note (Signed)
Addended by: Sandford Craze on: 12/05/2022 02:45 PM   Modules accepted: Orders

## 2022-12-10 ENCOUNTER — Telehealth: Payer: Self-pay | Admitting: Family

## 2022-12-10 DIAGNOSIS — Z0000 Encounter for general adult medical examination without abnormal findings: Secondary | ICD-10-CM

## 2022-12-10 NOTE — Telephone Encounter (Signed)
Lori with [facility] called to advise that they are unable to see patient as she is wheelchair bound and they are not equipped to assist her. Pt will need to be referred to another office.

## 2022-12-13 ENCOUNTER — Other Ambulatory Visit: Payer: Self-pay | Admitting: Family

## 2022-12-15 NOTE — Telephone Encounter (Signed)
Could you please call the [facility] and ask if they are equipped to handle exam for wheelchair bound patient. tks

## 2022-12-15 NOTE — Telephone Encounter (Signed)
Tried calling office- currently closed.

## 2022-12-16 NOTE — Addendum Note (Signed)
Addended by: Sandford Craze on: 12/16/2022 11:50 AM   Modules accepted: Orders

## 2022-12-16 NOTE — Telephone Encounter (Signed)
Spoke w/ GYN office across the hall- they don't have a lift- but the GYN office on [address] in [place] does.

## 2022-12-17 ENCOUNTER — Ambulatory Visit (INDEPENDENT_AMBULATORY_CARE_PROVIDER_SITE_OTHER): Payer: Medicare Other

## 2022-12-17 DIAGNOSIS — I428 Other cardiomyopathies: Secondary | ICD-10-CM

## 2022-12-17 LAB — CUP PACEART REMOTE DEVICE CHECK
Battery Remaining Longevity: 61 mo
Battery Remaining Percentage: 73 %
Battery Voltage: 2.99 V
Brady Statistic AP VP Percent: 1 %
Brady Statistic AP VS Percent: 1 %
Brady Statistic AS VP Percent: 99 %
Brady Statistic AS VS Percent: 1 %
Brady Statistic RA Percent Paced: 1 %
Date Time Interrogation Session: 20240703052246
HighPow Impedance: 77 Ohm
HighPow Impedance: 77 Ohm
Implantable Lead Connection Status: 753985
Implantable Lead Connection Status: 753985
Implantable Lead Connection Status: 753985
Implantable Lead Implant Date: 20230403
Implantable Lead Implant Date: 20230403
Implantable Lead Implant Date: 20230403
Implantable Lead Location: 753858
Implantable Lead Location: 753859
Implantable Lead Location: 753860
Implantable Pulse Generator Implant Date: 20230403
Lead Channel Impedance Value: 360 Ohm
Lead Channel Impedance Value: 450 Ohm
Lead Channel Impedance Value: 860 Ohm
Lead Channel Pacing Threshold Amplitude: 0.75 V
Lead Channel Pacing Threshold Amplitude: 0.75 V
Lead Channel Pacing Threshold Amplitude: 1.25 V
Lead Channel Pacing Threshold Pulse Width: 0.5 ms
Lead Channel Pacing Threshold Pulse Width: 0.5 ms
Lead Channel Pacing Threshold Pulse Width: 0.5 ms
Lead Channel Sensing Intrinsic Amplitude: 1.9 mV
Lead Channel Sensing Intrinsic Amplitude: 3.8 mV
Lead Channel Setting Pacing Amplitude: 1.75 V
Lead Channel Setting Pacing Amplitude: 2 V
Lead Channel Setting Pacing Amplitude: 2.5 V
Lead Channel Setting Pacing Pulse Width: 0.5 ms
Lead Channel Setting Pacing Pulse Width: 0.5 ms
Lead Channel Setting Sensing Sensitivity: 0.5 mV
Pulse Gen Serial Number: 9986369
Zone Setting Status: 755011

## 2022-12-22 NOTE — Progress Notes (Signed)
HPI: FU cardiomyopathy. Echocardiogram in October of 2014 showed an ejection fraction of 40-45%, mild left ventricular hypertrophy and mild left atrial enlargement. Cardiac CTA August 2021 showed calcium score of 0 and no coronary disease. Sherryll Burger was discontinued previously as patient thought it was causing dry mouth.  Echocardiogram June 2022 showed ejection fraction 30 to 35%, mild left ventricular enlargement, grade 1 diastolic dysfunction, moderate pulmonary hypertension, mild to moderate mitral regurgitation.  Cardiac MRI February 2023 showed ejection fraction 31% with septal dyskinesis consistent with left bundle branch block but no infiltrative process.  Patient had biventricular ICD placed April 2023.  Since last seen she has dyspnea with more vigorous activities but no orthopnea, PND, pedal edema or CP.  Current Outpatient Medications  Medication Sig Dispense Refill   Accu-Chek Softclix Lancets lancets TEST UP TO 4 TIMES DAILY 400 each 1   acetaminophen (TYLENOL) 500 MG tablet Take 1,000 mg by mouth every 8 (eight) hours as needed for moderate pain or headache.     albuterol (VENTOLIN HFA) 108 (90 Base) MCG/ACT inhaler USE 2 INHALATIONS BY MOUTH EVERY 6 HOURS AS NEEDED 34 g 2   allopurinol (ZYLOPRIM) 100 MG tablet TAKE 1 TABLET BY MOUTH DAILY 100 tablet 1   Blood Glucose Monitoring Suppl (ACCU-CHEK GUIDE) w/Device KIT USE AS DIRECTED 1 kit 0   carvedilol (COREG) 12.5 MG tablet TAKE 1 TABLET BY MOUTH  TWICE DAILY 200 tablet 2   cholecalciferol (VITAMIN D3) 25 MCG (1000 UNIT) tablet Take 1,000 Units by mouth daily.     COVID-19 mRNA vaccine 2023-2024 (COMIRNATY) syringe Inject into the muscle. 0.3 mL 0   dapagliflozin propanediol (FARXIGA) 10 MG TABS tablet TAKE 1 TABLET(10 MG) BY MOUTH DAILY BEFORE BREAKFAST 90 tablet 0   famotidine (PEPCID) 40 MG tablet TAKE 1 TABLET BY MOUTH TWICE  DAILY 200 tablet 2   fluticasone (FLONASE) 50 MCG/ACT nasal spray Place 2 sprays into both nostrils  daily. (Patient taking differently: Place 2 sprays into both nostrils daily as needed for allergies.) 16 g 1   gabapentin (NEURONTIN) 300 MG capsule TAKE 3 CAPSULES BY MOUTH 3 TIMES DAILY 900 capsule 2   glucose blood (ACCU-CHEK GUIDE) test strip USE UP TO 4 TIMES DAILY 400 strip 1   latanoprost (XALATAN) 0.005 % ophthalmic solution Place 1 drop into both eyes at bedtime.   11   losartan (COZAAR) 25 MG tablet TAKE 1 TABLET BY MOUTH DAILY 100 tablet 2   montelukast (SINGULAIR) 10 MG tablet Take 1 tablet (10 mg total) by mouth at bedtime. 90 tablet 0   nystatin (NYAMYC) powder APPLY TOPICALLY TO THE AFFECTED  AREA 3 TIMES DAILY AS NEEDED FOR RASH 240 g 1   ondansetron (ZOFRAN) 4 MG tablet Take 1 tablet (4 mg total) by mouth every 8 (eight) hours as needed for nausea or vomiting. 90 tablet 2   pantoprazole (PROTONIX) 40 MG tablet TAKE 1 TABLET BY MOUTH TWICE  DAILY 200 tablet 2   potassium chloride SA (KLOR-CON M) 20 MEQ tablet TAKE 1 TABLET BY MOUTH DAILY 100 tablet 1   rosuvastatin (CRESTOR) 10 MG tablet Take 1 tablet (10 mg total) by mouth daily. For cholesterol and heart. 90 tablet 3   Tart Cherry 1200 MG CAPS Take 1,200 mg by mouth 2 (two) times a week.     timolol (BETIMOL) 0.5 % ophthalmic solution Place 1 drop into both eyes every morning.      torsemide (DEMADEX) 20 MG tablet TAKE 2  TABLETS BY MOUTH  ALTERNATING WITH 1 TABLET BY  MOUTH EVERY OTHER DAY 135 tablet 3   amitriptyline (ELAVIL) 25 MG tablet Take 1 tablet (25 mg total) by mouth at bedtime. (Patient not taking: Reported on 01/02/2023) 90 tablet 1   celecoxib (CELEBREX) 100 MG capsule Take 100 mg by mouth daily as needed for pain. (Patient not taking: Reported on 01/02/2023)     scopolamine (TRANSDERM-SCOP) 1 MG/3DAYS Place 1 patch (1.5 mg total) onto the skin every 3 (three) days. (Patient not taking: Reported on 01/02/2023) 3 patch 0   spironolactone (ALDACTONE) 25 MG tablet TAKE 1 TABLET BY MOUTH DAILY (Patient not taking: Reported on  01/02/2023) 90 tablet 3   No current facility-administered medications for this visit.     Past Medical History:  Diagnosis Date   Acute systolic congestive heart failure (HCC) 03/01/2014   Allergy    Anemia    Arthritis    Cardiomyopathy (HCC)    Cervical myelopathy (HCC)    CHF (congestive heart failure) (HCC)    GERD (gastroesophageal reflux disease)    Gout    Heart disease    Hyperlipidemia    Hypertension    Leg pain    Overactive bladder     Past Surgical History:  Procedure Laterality Date   ANTERIOR CERVICAL DECOMP/DISCECTOMY FUSION N/A 08/03/2016   Procedure: ANTERIOR CERVICAL DECOMPRESSION/DISCECTOMY FUSION C4-5, C5-6;  Surgeon: Barnett Abu, MD;  Location[facility] OR;  Service: Neurosurgery;  Laterality: N/A;   BIV ICD INSERTION CRT-D N/A 09/16/2021   Procedure: BIV ICD INSERTION CRT-D;  Surgeon: Marinus Maw, MD;  Location: [facility];  Service: Cardiovascular;  Laterality: N/A;   COLONOSCOPY     COLONOSCOPY WITH PROPOFOL N/A 02/24/2019   Procedure: COLONOSCOPY WITH PROPOFOL;  Surgeon: Rachael Fee, MD;  Location: [facility];  Service: Endoscopy;  Laterality: N/A;   ESOPHAGOGASTRODUODENOSCOPY (EGD) WITH PROPOFOL N/A 04/25/2021   Procedure: ESOPHAGOGASTRODUODENOSCOPY (EGD) WITH PROPOFOL;  Surgeon: Rachael Fee, MD;  Location: [facility];  Service: Endoscopy;  Laterality: N/A;   KNEE ARTHROSCOPY     2007   ROTATOR CUFF REPAIR Right 03/23/13   TOTAL KNEE ARTHROPLASTY  09/22/2011   Procedure: TOTAL KNEE ARTHROPLASTY;  Surgeon: Raymon Mutton, MD;  Location: [facility] OR;  Service: Orthopedics;  Laterality: Right;    Social History   Socioeconomic History   Marital status: Married    Spouse name: Not on file   Number of children: 2   Years of education: HS   Highest education level: Some college, no degree  Occupational History   Occupation: Acupuncturist: [employer]  Tobacco Use   Smoking status: Never   Smokeless tobacco: Never  Vaping  Use   Vaping status: Never Used  Substance and Sexual Activity   Alcohol use: Not Currently    Alcohol/week: 2.0 standard drinks of alcohol    Types: 2 Glasses of wine per week   Drug use: No   Sexual activity: Not on file  Other Topics Concern   Not on file  Social History Narrative   Married- 32 years   2 children- grown daughter- lives in [place]- has 2 children   Grown son lives in [place]- 4 children   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   1-2 cups caffeine daily.   Lives at home with her husband.  Social Determinants of Health   Financial Resource Strain: Medium Risk (09/26/2022)   Overall Financial Resource Strain (CARDIA)    Difficulty of Paying Living Expenses: Somewhat hard  Food Insecurity: No Food Insecurity (09/26/2022)   Hunger Vital Sign    Worried About Running Out of Food in the Last Year: Never true    Ran Out of Food in the Last Year: Never true  Transportation Needs: No Transportation Needs (09/26/2022)   PRAPARE - Administrator, Civil Service (Medical): No    Lack of Transportation (Non-Medical): No  Physical Activity: Unknown (09/26/2022)   Exercise Vital Sign    Days of Exercise per Week: 0 days    Minutes of Exercise per Session: Not on file  Stress: No Stress Concern Present (09/26/2022)   [employer] of Occupational Health - Occupational Stress Questionnaire    Feeling of Stress : Only a little  Social Connections: Moderately Integrated (09/26/2022)   Social Connection and Isolation Panel [NHANES]    Frequency of Communication with Friends and Family: More than three times a week    Frequency of Social Gatherings with Friends and Family: Patient declined    Attends Religious Services: 1 to 4 times per year    Active Member of [employer] or Organizations: No    Attends Engineer, structural: Not on file    Marital Status:  Married  Catering manager Violence: Not on file    Family History  Problem Relation Age of Onset   Cancer Mother        colon 64 and breast 30- deceased   Colon cancer Mother    Diabetes Father        living   Asthma Father    Hypertension Father    Heart attack Father 51       medical management per pt   Glaucoma Father        had eye transplant   Kidney failure Father        acute renal failure- died 29   Esophageal cancer Neg Hx    Stomach cancer Neg Hx    Rectal cancer Neg Hx     ROS: no fevers or chills, productive cough, hemoptysis, dysphasia, odynophagia, melena, hematochezia, dysuria, hematuria, rash, seizure activity, orthopnea, PND, pedal edema, claudication. Remaining systems are negative.  Physical Exam: Well-developed obese in no acute distress.  Skin is warm and dry.  HEENT is normal.  Neck is supple.  Chest is clear to auscultation with normal expansion.  Cardiovascular exam is regular rate and rhythm.  Abdominal exam nontender or distended. No masses palpated. Extremities show trace edema. neuro grossly intact  A/P  1 nonischemic cardiomyopathy-continue losartan (did not tolerate entresto), carvedilol, farxiga and spironolactone.  Check potassium.  Repeat echocardiogram.  2 history of CRT-D-follow-up Dr. Ladona Ridgel.  3 chronic systolic congestive heart failure-patient appears to be euvolemic.  Continue Demadex, spironolactone and farxiga.  4 hypertension-blood pressure controlled.  Continue present medical regimen.  5 obstructive sleep apnea-managed by Dr. Maple Hudson.  6 morbid obesity-we discussed the importance of weight loss.  7 preoperative evaluation prior to knee replacement-no CP; not in CHF; previous ischemia eval neg; she may proceed without further cardiac eval.  Olga Millers, MD

## 2023-01-02 ENCOUNTER — Ambulatory Visit: Payer: Medicare Other | Attending: Cardiology | Admitting: Cardiology

## 2023-01-02 ENCOUNTER — Encounter: Payer: Self-pay | Admitting: Cardiology

## 2023-01-02 VITALS — BP 122/70 | HR 82 | Ht 66.0 in | Wt 277.0 lb

## 2023-01-02 DIAGNOSIS — I10 Essential (primary) hypertension: Secondary | ICD-10-CM

## 2023-01-02 DIAGNOSIS — Z95810 Presence of automatic (implantable) cardiac defibrillator: Secondary | ICD-10-CM | POA: Diagnosis not present

## 2023-01-02 DIAGNOSIS — I5022 Chronic systolic (congestive) heart failure: Secondary | ICD-10-CM

## 2023-01-02 DIAGNOSIS — I428 Other cardiomyopathies: Secondary | ICD-10-CM | POA: Diagnosis not present

## 2023-01-02 NOTE — Patient Instructions (Signed)
    Testing/Procedures:  Your physician has requested that you have an echocardiogram. Echocardiography is a painless test that uses sound waves to create images of your heart. It provides your doctor with information about the size and shape of your heart and how well your heart's chambers and valves are working. This procedure takes approximately one hour. There are no restrictions for this procedure. Please do NOT wear cologne, perfume, aftershave, or lotions (deodorant is allowed). Please arrive 15 minutes prior to your appointment time. [address]   Follow-Up: At [facility], you and your health needs are our priority.  As part of our continuing mission to provide you with exceptional heart care, we have created designated Provider Care Teams.  These Care Teams include your primary Cardiologist (physician) and Advanced Practice Providers (APPs -  Physician Assistants and Nurse Practitioners) who all work together to provide you with the care you need, when you need it.  We recommend signing up for the patient portal called "MyChart".  Sign up information is provided on this After Visit Summary.  MyChart is used to connect with patients for Virtual Visits (Telemedicine).  Patients are able to view lab/test results, encounter notes, upcoming appointments, etc.  Non-urgent messages can be sent to your provider as well.   To learn more about what you can do with MyChart, go to [email or web address].    Your next appointment:   12 month(s)  Provider:   Olga Millers, MD

## 2023-01-03 LAB — BASIC METABOLIC PANEL
BUN/Creatinine Ratio: 15 (ref 12–28)
BUN: 15 mg/dL (ref 8–27)
CO2: 24 mmol/L (ref 20–29)
Calcium: 9.1 mg/dL (ref 8.7–10.3)
Chloride: 103 mmol/L (ref 96–106)
Creatinine, Ser: 1.03 mg/dL — ABNORMAL HIGH (ref 0.57–1.00)
Glucose: 111 mg/dL — ABNORMAL HIGH (ref 70–99)
Potassium: 4.3 mmol/L (ref 3.5–5.2)
Sodium: 141 mmol/L (ref 134–144)
eGFR: 60 mL/min/{1.73_m2} (ref >59)

## 2023-01-03 LAB — SPECIMEN STATUS REPORT

## 2023-01-08 NOTE — Progress Notes (Signed)
Remote ICD transmission.   

## 2023-01-15 ENCOUNTER — Other Ambulatory Visit: Payer: Self-pay | Admitting: Family

## 2023-01-19 ENCOUNTER — Telehealth: Payer: Self-pay | Admitting: Family Medicine

## 2023-01-19 NOTE — Telephone Encounter (Signed)
Attempted to reach patient about her appointment. Left her a detailed message about coming into the office for a scheduled appointment at 8:15.

## 2023-01-20 ENCOUNTER — Telehealth (INDEPENDENT_AMBULATORY_CARE_PROVIDER_SITE_OTHER): Payer: Medicare Other | Admitting: Family

## 2023-01-20 DIAGNOSIS — R32 Unspecified urinary incontinence: Secondary | ICD-10-CM

## 2023-01-20 DIAGNOSIS — G43809 Other migraine, not intractable, without status migrainosus: Secondary | ICD-10-CM | POA: Diagnosis not present

## 2023-01-20 DIAGNOSIS — J019 Acute sinusitis, unspecified: Secondary | ICD-10-CM

## 2023-01-20 MED ORDER — NORTRIPTYLINE HCL 10 MG PO CAPS: ORAL_CAPSULE | ORAL | 0 refills | Status: DC

## 2023-01-20 MED ORDER — AMOXICILLIN-POT CLAVULANATE 875-125 MG PO TABS: 1.0000 | ORAL_TABLET | Freq: Two times a day (BID) | ORAL | 0 refills | Status: DC

## 2023-01-20 MED ORDER — FLUCONAZOLE 150 MG PO TABS: 150.0000 mg | ORAL_TABLET | Freq: Once | ORAL | 0 refills | Status: AC

## 2023-01-20 NOTE — Progress Notes (Signed)
MyChart Video Visit    Virtual Visit via Video Note    Patient location: Home. Patient and provider in visit Provider location: Office  I discussed the limitations of evaluation and management by telemedicine and the availability of in person appointments. The patient expressed understanding and agreed to proceed.  Visit Date: 01/20/2023  Today's healthcare provider: Lemont Fillers, NP     Subjective:    Patient ID: Briana Miller, female    DOB: Aug 18, 1955, 67 y.o.   MRN: [id number]  Chief Complaint  Patient presents with   Urinary Incontinence    Patient complains of urinary incontinence that started about 2 weeks ago.    Nasal Congestion    Patient complains of congestion and cough for about 2 weeks.     HPI The patient, with a history of quadriplegia due to cervical myelopathy, presents with a two-week history of sinus congestion and cough. She describes the cough as productive of mucus. She has been self-managing with Singulair and Benadryl, with minimal relief. She denies significant nasal discharge but reports postnasal drip.  In addition, she reports a recurrence of headaches, which she describes as located at the top of her head. The headaches had previously been managed with nortriptyline, which she found effective. She is requesting a refill.   She also reports a two-week history of urinary incontinence, characterized by urgency and occasional rapid loss of urine. She denies any known infection and recalls a previous similar episode that was managed with antibiotics.  Past Medical History:  Diagnosis Date   Acute systolic congestive heart failure (HCC) 03/01/2014   Allergy    Anemia    Arthritis    Cardiomyopathy (HCC)    Cervical myelopathy (HCC)    CHF (congestive heart failure) (HCC)    GERD (gastroesophageal reflux disease)    Gout    Heart disease    Hyperlipidemia    Hypertension    Leg pain    Overactive bladder     Past  Surgical History:  Procedure Laterality Date   ANTERIOR CERVICAL DECOMP/DISCECTOMY FUSION N/A 08/03/2016   Procedure: ANTERIOR CERVICAL DECOMPRESSION/DISCECTOMY FUSION C4-5, C5-6;  Surgeon: Barnett Abu, MD;  Location: [facility] OR;  Service: Neurosurgery;  Laterality: N/A;   BIV ICD INSERTION CRT-D N/A 09/16/2021   Procedure: BIV ICD INSERTION CRT-D;  Surgeon: Marinus Maw, MD;  Location: [facility] INVASIVE CV LAB;  Service: Cardiovascular;  Laterality: N/A;   COLONOSCOPY     COLONOSCOPY WITH PROPOFOL N/A 02/24/2019   Procedure: COLONOSCOPY WITH PROPOFOL;  Surgeon: Rachael Fee, MD;  Location: [facility] ENDOSCOPY;  Service: Endoscopy;  Laterality: N/A;   ESOPHAGOGASTRODUODENOSCOPY (EGD) WITH PROPOFOL N/A 04/25/2021   Procedure: ESOPHAGOGASTRODUODENOSCOPY (EGD) WITH PROPOFOL;  Surgeon: Rachael Fee, MD;  Location: [facility] ENDOSCOPY;  Service: Endoscopy;  Laterality: N/A;   KNEE ARTHROSCOPY     2007   ROTATOR CUFF REPAIR Right 03/23/13   TOTAL KNEE ARTHROPLASTY  09/22/2011   Procedure: TOTAL KNEE ARTHROPLASTY;  Surgeon: Raymon Mutton, MD;  Location: [facility] OR;  Service: Orthopedics;  Laterality: Right;    Family History  Problem Relation Age of Onset   Cancer Mother        colon 52 and breast 65- deceased   Colon cancer Mother    Diabetes Father        living   Asthma Father    Hypertension Father    Heart attack Father 69       medical management per pt  Glaucoma Father        had eye transplant   Kidney failure Father        acute renal failure- died 101   Esophageal cancer Neg Hx    Stomach cancer Neg Hx    Rectal cancer Neg Hx     Social History   Socioeconomic History   Marital status: Married    Spouse name: Not on file   Number of children: 2   Years of education: HS   Highest education level: Some college, no degree  Occupational History   Occupation: Acupuncturist: [employer]  Tobacco Use   Smoking status: Never   Smokeless tobacco: Never  Vaping Use   Vaping status:  Never Used  Substance and Sexual Activity   Alcohol use: Not Currently    Alcohol/week: 2.0 standard drinks of alcohol    Types: 2 Glasses of wine per week   Drug use: No   Sexual activity: Not on file  Other Topics Concern   Not on file  Social History Narrative   Married- 32 years   2 children- grown daughter- lives in [place]- has 2 children   Grown son lives in [place]- 4 children   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   1-2 cups caffeine daily.   Lives at home with her husband.   Social Determinants of Health   Financial Resource Strain: Medium Risk (09/26/2022)   Overall Financial Resource Strain (CARDIA)    Difficulty of Paying Living Expenses: Somewhat hard  Food Insecurity: No Food Insecurity (09/26/2022)   Hunger Vital Sign    Worried About Running Out of Food in the Last Year: Never true    Ran Out of Food in the Last Year: Never true  Transportation Needs: No Transportation Needs (09/26/2022)   PRAPARE - Administrator, Civil Service (Medical): No    Lack of Transportation (Non-Medical): No  Physical Activity: Unknown (09/26/2022)   Exercise Vital Sign    Days of Exercise per Week: 0 days    Minutes of Exercise per Session: Not on file  Stress: No Stress Concern Present (09/26/2022)   [employer] of Occupational Health - Occupational Stress Questionnaire    Feeling of Stress : Only a little  Social Connections: Moderately Integrated (09/26/2022)   Social Connection and Isolation Panel [NHANES]    Frequency of Communication with Friends and Family: More than three times a week    Frequency of Social Gatherings with Friends and Family: Patient declined    Attends Religious Services: 1 to 4 times per year    Active Member of [employer] or Organizations: No    Attends Engineer, structural: Not on file    Marital Status: Married  Careers information officer Violence: Not on file    Outpatient Medications Prior to Visit  Medication Sig Dispense Refill   Accu-Chek Softclix Lancets lancets TEST UP TO 4 TIMES DAILY 400 each 1   acetaminophen (TYLENOL) 500 MG tablet Take 1,000 mg by mouth every 8 (eight) hours as needed for moderate pain or headache.     albuterol (VENTOLIN HFA) 108 (90 Base) MCG/ACT inhaler USE 2 INHALATIONS BY MOUTH EVERY 6 HOURS AS NEEDED 34 g 2   allopurinol (ZYLOPRIM) 100 MG tablet TAKE 1 TABLET BY MOUTH DAILY 100 tablet 1   Blood Glucose Monitoring Suppl (ACCU-CHEK  GUIDE) w/Device KIT USE AS DIRECTED 1 kit 0   carvedilol (COREG) 12.5 MG tablet TAKE 1 TABLET BY MOUTH  TWICE DAILY 200 tablet 2   celecoxib (CELEBREX) 100 MG capsule Take 100 mg by mouth daily as needed for pain.     cholecalciferol (VITAMIN D3) 25 MCG (1000 UNIT) tablet Take 1,000 Units by mouth daily.     COVID-19 mRNA vaccine 2023-2024 (COMIRNATY) syringe Inject into the muscle. 0.3 mL 0   dapagliflozin propanediol (FARXIGA) 10 MG TABS tablet TAKE 1 TABLET(10 MG) BY MOUTH DAILY BEFORE BREAKFAST 90 tablet 0   famotidine (PEPCID) 40 MG tablet TAKE 1 TABLET BY MOUTH TWICE  DAILY 200 tablet 2   fluticasone (FLONASE) 50 MCG/ACT nasal spray Place 2 sprays into both nostrils daily. (Patient taking differently: Place 2 sprays into both nostrils daily as needed for allergies.) 16 g 1   gabapentin (NEURONTIN) 300 MG capsule TAKE 3 CAPSULES BY MOUTH 3 TIMES DAILY 900 capsule 2   glucose blood (ACCU-CHEK GUIDE) test strip USE UP TO 4 TIMES DAILY 400 strip 1   latanoprost (XALATAN) 0.005 % ophthalmic solution Place 1 drop into both eyes at bedtime.   11   losartan (COZAAR) 25 MG tablet TAKE 1 TABLET BY MOUTH DAILY 100 tablet 2   montelukast (SINGULAIR) 10 MG tablet Take 1 tablet (10 mg total) by mouth at bedtime. 90 tablet 0   nystatin (NYAMYC) powder APPLY TOPICALLY TO THE AFFECTED  AREA 3 TIMES DAILY AS NEEDED FOR RASH 240 g 1   ondansetron (ZOFRAN) 4 MG tablet TAKE  1 TABLET BY MOUTH EVERY 8  HOURS AS NEEDED FOR NAUSEA AND  VOMITING 270 tablet 0   pantoprazole (PROTONIX) 40 MG tablet TAKE 1 TABLET BY MOUTH TWICE  DAILY 200 tablet 2   potassium chloride SA (KLOR-CON M) 20 MEQ tablet TAKE 1 TABLET BY MOUTH DAILY 100 tablet 1   rosuvastatin (CRESTOR) 10 MG tablet Take 1 tablet (10 mg total) by mouth daily. For cholesterol and heart. 90 tablet 3   scopolamine (TRANSDERM-SCOP) 1 MG/3DAYS Place 1 patch (1.5 mg total) onto the skin every 3 (three) days. 3 patch 0   spironolactone (ALDACTONE) 25 MG tablet TAKE 1 TABLET BY MOUTH DAILY 90 tablet 3   Tart Cherry 1200 MG CAPS Take 1,200 mg by mouth 2 (two) times a week.     timolol (BETIMOL) 0.5 % ophthalmic solution Place 1 drop into both eyes every morning.      torsemide (DEMADEX) 20 MG tablet TAKE 2 TABLETS BY MOUTH  ALTERNATING WITH 1 TABLET BY  MOUTH EVERY OTHER DAY 135 tablet 3   amitriptyline (ELAVIL) 25 MG tablet Take 1 tablet (25 mg total) by mouth at bedtime. 90 tablet 1   No facility-administered medications prior to visit.    Allergies  Allergen Reactions   Ozempic (0.25 Or 0.5 Mg-Dose) [Semaglutide(0.25 Or 0.5mg -Dos)]    Entresto [Sacubitril-Valsartan] Other (See Comments)    "Dry mouth"    ROS See HPI    Objective:    Physical Exam  LMP 06/17/2007  Wt Readings from Last 3 Encounters:  01/02/23 277 lb (125.6 kg)  12/11/21 277 lb (125.6 kg)  10/17/21 225 lb (102.1 kg)    Gen: Awake, alert, no acute distress Resp: Breathing is even and non-labored Psych: calm/pleasant demeanor Neuro: Alert and Oriented x 3, + facial symmetry, speech is clear.     Assessment & Plan:   Problem List Items Addressed This Visit  Unprioritized   Urinary incontinence    Recent onset of urgency and incontinence. No recent history of urinary tract infection. -Prescribe Augmentin 875mg  twice daily for 7 days. -If symptoms persist, patient to come in for urine culture.      Migraines     Uncontrolled with recurrence of headaches, previously managed with Nortriptyline. -Refill Nortriptyline prescription with instructions to take one at bedtime for a week, then increase to two at bedtime. -If headaches persist, patient to notify provider.      Relevant Medications   nortriptyline (PAMELOR) 10 MG capsule   Acute sinusitis - Primary    Two-week history of sinus congestion with postnasal drip and cough. No significant nasal discharge. Currently on Singulair and Benadryl. -Prescribe Augmentin 875mg  twice daily for 7 days. -Continue Singulair and Benadryl.  History of yeast infections with antibiotic use. -Prescribe Diflucan to be taken if symptoms of yeast infection develop.      Relevant Medications   amoxicillin-clavulanate (AUGMENTIN) 875-125 MG tablet   fluconazole (DIFLUCAN) 150 MG tablet    I am having Briana Miller start on amoxicillin-clavulanate, nortriptyline, and fluconazole. I am also having her maintain her latanoprost, timolol, celecoxib, cholecalciferol, Tart Cherry, fluticasone, acetaminophen, Accu-Chek Guide, rosuvastatin, scopolamine, spironolactone, torsemide, allopurinol, carvedilol, Comirnaty, losartan, pantoprazole, potassium chloride SA, gabapentin, Farxiga, famotidine, albuterol, amitriptyline, montelukast, Accu-Chek Guide, nystatin, Accu-Chek Softclix Lancets, and ondansetron.  Meds ordered this encounter  Medications   amoxicillin-clavulanate (AUGMENTIN) 875-125 MG tablet    Sig: Take 1 tablet by mouth 2 (two) times daily.    Dispense:  14 tablet    Refill:  0    Order Specific Question:   Supervising Provider    Answer:   Danise Edge A [[id number]]   nortriptyline (PAMELOR) 10 MG capsule    Sig: Take 1 capsule by mouth at bedtime for a week, then 2 capsules at bedtime.    Dispense:  180 capsule    Refill:  0    Order Specific Question:   Supervising Provider    Answer:   Danise Edge A [[id number]]   fluconazole (DIFLUCAN) 150 MG tablet    Sig:  Take 1 tablet (150 mg total) by mouth once for 1 dose.    Dispense:  1 tablet    Refill:  0    Order Specific Question:   Supervising Provider    Answer:   Danise Edge A [[id number]]    I discussed the assessment and treatment plan with the patient. The patient was provided an opportunity to ask questions and all were answered. The patient agreed with the plan and demonstrated an understanding of the instructions.   The patient was advised to call back or seek an in-person evaluation if the symptoms worsen or if the condition fails to improve as anticipated.   Lemont Fillers, NP [facility] at [facility] [phone] (phone) [phone] (fax)  [facility]

## 2023-01-20 NOTE — Assessment & Plan Note (Addendum)
Two-week history of sinus congestion with postnasal drip and cough. No significant nasal discharge. Currently on Singulair and Benadryl. -Prescribe Augmentin 875mg  twice daily for 7 days. -Continue Singulair and Benadryl.  History of yeast infections with antibiotic use. -Prescribe Diflucan to be taken if symptoms of yeast infection develop.

## 2023-01-20 NOTE — Assessment & Plan Note (Signed)
Uncontrolled with recurrence of headaches, previously managed with Nortriptyline. -Refill Nortriptyline prescription with instructions to take one at bedtime for a week, then increase to two at bedtime. -If headaches persist, patient to notify provider.

## 2023-01-20 NOTE — Assessment & Plan Note (Signed)
Recent onset of urgency and incontinence. No recent history of urinary tract infection. -Prescribe Augmentin 875mg  twice daily for 7 days. -If symptoms persist, patient to come in for urine culture.

## 2023-01-20 NOTE — Patient Instructions (Signed)
VISIT SUMMARY:  During your visit, we discussed your ongoing sinus infection and cough, recurrence of headaches, and recent urinary incontinence. We also discussed your upcoming gynecology appointment and planned a routine follow-up in three months.  YOUR PLAN:  -SINUSITIS: Sinusitis is an inflammation or swelling of the tissue lining the sinuses. We will treat this with a course of Augmentin, an antibiotic, while continuing your current medications, Singulair and Benadryl.  -URINARY INCONTINENCE: Urinary incontinence is a loss of bladder control. We will treat this with a course of Augmentin, an antibiotic. If symptoms persist, we will need to conduct a urine culture.  -MIGRAINE: Migraines are a type of headache characterized by severe pain. We will manage this by refilling your Nortriptyline prescription. If headaches persist, please notify us.  -RISK OF YEAST INFECTION: Antibiotics can sometimes lead to yeast infections. We will prescribe Diflucan, a medication to treat yeast infections, to be taken if symptoms develop.  -GYNECOLOGY FOLLOW-UP: You have a gynecology appointment scheduled for 02/20/2023. Please confirm the appointment details with the gynecology office.  INSTRUCTIONS:  Please take the prescribed medications as directed. If your symptoms persist or worsen, or if you develop symptoms of a yeast infection, please contact us immediately. Also, remember to confirm your upcoming gynecology appointment.

## 2023-01-29 NOTE — Progress Notes (Deleted)
01/29/2023 Briana Miller [id number] 1955-07-22  Referring provider: Sandford Craze, NP Primary GI doctor: (Dr. Christella Hartigan)  ASSESSMENT AND PLAN:   There are no diagnoses linked to this encounter.   Patient Care Team: Sandford Craze, NP as PCP - General (Internal Medicine) Jens Som Madolyn Frieze, MD as PCP - Cardiology (Cardiology) Marinus Maw, MD as PCP - Electrophysiology (Clinical Cardiac Electrophysiology) Henrene Pastor, RPH-CPP (Pharmacist) [facility]  HISTORY OF PRESENT ILLNESS: 67 y.o. female with a past medical history of ***and others listed below presents for evaluation of GERD and medication refill.   1.Family history of colon cancer; mother in her early 20s; colonoscopy Dr. Christella Hartigan 05/2013 found diverticulosis, no polyps, recommended recall at 5 years.  Colonoscopy September 2020 found diverticulosis but was otherwise normal. Recall 02/2024 due to family history of colon CA 2. RUQ pains: clinically seemed biliary-like; 05/2013 [place] [facility] was normal; 05/2013 HIDA scan with GB EF was normal.05/2013 LFTs were normal.  Was recommended, scheduled for EGD but she cancelled it.  EGD 05/2014 Dr. Christella Hartigan, mild to moderate gastritis; path showed no H. Pylori.  She had been taking BC powders twice a day (around 2013-2015).  Repeat EGD June 2017, Dr. Christella Hartigan.  This was requested by her bariatric surgeon prior to surgery given her history of gastritis.  I noted a small about of food residue in the stomach, the examination was otherwise normal. Repeat EGD 04/2021 in the hospital due to obestiy/immobility showed normal GI tract 3. Morbid obesity BMI 41 (10/2017 data)  Pending Echo 09/2022   She {Actions; denies-reports:120008} blood thinner use.  She {Actions; denies-reports:120008} NSAID use.  She {Actions; denies-reports:120008} ETOH use.   She {Actions; denies-reports:120008} tobacco use.  She {Actions; denies-reports:120008} drug use.    She   reports that she has never smoked. She has never used smokeless tobacco. She reports that she does not currently use alcohol after a past usage of about 2.0 standard drinks of alcohol per week. She reports that she does not use drugs.  RELEVANT LABS AND IMAGING: CBC    Component Value Date/Time   WBC 4.7 09/06/2021 1105   WBC 5.0 02/15/2021 1143   RBC 4.69 09/06/2021 1105   RBC 4.40 02/15/2021 1143   HGB 13.1 09/06/2021 1105   HCT 39.8 09/06/2021 1105   PLT 270 09/06/2021 1105   MCV 85 09/06/2021 1105   MCH 27.9 09/06/2021 1105   MCH 29.1 08/05/2016 0541   MCHC 32.9 09/06/2021 1105   MCHC 33.1 02/15/2021 1143   RDW 14.4 09/06/2021 1105   LYMPHSABS 1.5 09/06/2021 1105   MONOABS 0.7 02/15/2021 1143   EOSABS 0.2 09/06/2021 1105   BASOSABS 0.0 09/06/2021 1105   No results for input(s): "HGB" in the last 8760 hours.  CMP     Component Value Date/Time   NA 141 01/02/2023 0000   K 4.3 01/02/2023 0000   CL 103 01/02/2023 0000   CO2 24 01/02/2023 0000   GLUCOSE 111 (H) 01/02/2023 0000   GLUCOSE 105 (H) 09/26/2022 1159   BUN 15 01/02/2023 0000   CREATININE 1.03 (H) 01/02/2023 0000   CREATININE 0.95 12/02/2019 1434   CALCIUM 9.1 01/02/2023 0000   PROT 7.4 09/26/2022 1159   ALBUMIN 4.4 09/26/2022 1159   AST 14 09/26/2022 1159   ALT 11 09/26/2022 1159   ALKPHOS 93 09/26/2022 1159   BILITOT 0.5 09/26/2022 1159   GFRNONAA 45 (L) 07/27/2020 1446   GFRAA 52 (L) 07/27/2020 1446  Latest Ref Rng & Units 09/26/2022   11:59 AM 02/15/2021   11:43 AM 04/08/2019    2:06 PM  Hepatic Function  Total Protein 6.0 - 8.3 g/dL 7.4  7.4  7.2   Albumin 3.5 - 5.2 g/dL 4.4  4.1  4.2   AST 0 - 37 U/L 14  16  16    ALT 0 - 35 U/L 11  16  13    Alk Phosphatase 39 - 117 U/L 93  96  87   Total Bilirubin 0.2 - 1.2 mg/dL 0.5  0.6  0.6   Bilirubin, Direct 0.0 - 0.3 mg/dL   0.1       Current Medications:   Current Outpatient Medications (Endocrine & Metabolic):    dapagliflozin propanediol  (FARXIGA) 10 MG TABS tablet, TAKE 1 TABLET(10 MG) BY MOUTH DAILY BEFORE BREAKFAST  Current Outpatient Medications (Cardiovascular):    carvedilol (COREG) 12.5 MG tablet, TAKE 1 TABLET BY MOUTH  TWICE DAILY   losartan (COZAAR) 25 MG tablet, TAKE 1 TABLET BY MOUTH DAILY   rosuvastatin (CRESTOR) 10 MG tablet, Take 1 tablet (10 mg total) by mouth daily. For cholesterol and heart.   spironolactone (ALDACTONE) 25 MG tablet, TAKE 1 TABLET BY MOUTH DAILY   torsemide (DEMADEX) 20 MG tablet, TAKE 2 TABLETS BY MOUTH  ALTERNATING WITH 1 TABLET BY  MOUTH EVERY OTHER DAY  Current Outpatient Medications (Respiratory):    albuterol (VENTOLIN HFA) 108 (90 Base) MCG/ACT inhaler, USE 2 INHALATIONS BY MOUTH EVERY 6 HOURS AS NEEDED   fluticasone (FLONASE) 50 MCG/ACT nasal spray, Place 2 sprays into both nostrils daily. (Patient taking differently: Place 2 sprays into both nostrils daily as needed for allergies.)   montelukast (SINGULAIR) 10 MG tablet, Take 1 tablet (10 mg total) by mouth at bedtime.  Current Outpatient Medications (Analgesics):    acetaminophen (TYLENOL) 500 MG tablet, Take 1,000 mg by mouth every 8 (eight) hours as needed for moderate pain or headache.   allopurinol (ZYLOPRIM) 100 MG tablet, TAKE 1 TABLET BY MOUTH DAILY   celecoxib (CELEBREX) 100 MG capsule, Take 100 mg by mouth daily as needed for pain.   Current Outpatient Medications (Other):    Accu-Chek Softclix Lancets lancets, TEST UP TO 4 TIMES DAILY   amitriptyline (ELAVIL) 25 MG tablet, Take 1 tablet (25 mg total) by mouth at bedtime.   amoxicillin-clavulanate (AUGMENTIN) 875-125 MG tablet, Take 1 tablet by mouth 2 (two) times daily.   Blood Glucose Monitoring Suppl (ACCU-CHEK GUIDE) w/Device KIT, USE AS DIRECTED   cholecalciferol (VITAMIN D3) 25 MCG (1000 UNIT) tablet, Take 1,000 Units by mouth daily.   COVID-19 mRNA vaccine 2023-2024 (COMIRNATY) syringe, Inject into the muscle.   famotidine (PEPCID) 40 MG tablet, TAKE 1 TABLET BY  MOUTH TWICE  DAILY   gabapentin (NEURONTIN) 300 MG capsule, TAKE 3 CAPSULES BY MOUTH 3 TIMES DAILY   glucose blood (ACCU-CHEK GUIDE) test strip, USE UP TO 4 TIMES DAILY   latanoprost (XALATAN) 0.005 % ophthalmic solution, Place 1 drop into both eyes at bedtime.    nortriptyline (PAMELOR) 10 MG capsule, Take 1 capsule by mouth at bedtime for a week, then 2 capsules at bedtime.   nystatin (NYAMYC) powder, APPLY TOPICALLY TO THE AFFECTED  AREA 3 TIMES DAILY AS NEEDED FOR RASH   ondansetron (ZOFRAN) 4 MG tablet, TAKE 1 TABLET BY MOUTH EVERY 8  HOURS AS NEEDED FOR NAUSEA AND  VOMITING   pantoprazole (PROTONIX) 40 MG tablet, TAKE 1 TABLET BY MOUTH TWICE  DAILY  potassium chloride SA (KLOR-CON M) 20 MEQ tablet, TAKE 1 TABLET BY MOUTH DAILY   scopolamine (TRANSDERM-SCOP) 1 MG/3DAYS, Place 1 patch (1.5 mg total) onto the skin every 3 (three) days.   Tart Cherry 1200 MG CAPS, Take 1,200 mg by mouth 2 (two) times a week.   timolol (BETIMOL) 0.5 % ophthalmic solution, Place 1 drop into both eyes every morning.   Medical History:  Past Medical History:  Diagnosis Date   Acute systolic congestive heart failure (HCC) 03/01/2014   Allergy    Anemia    Arthritis    Cardiomyopathy (HCC)    Cervical myelopathy (HCC)    CHF (congestive heart failure) (HCC)    GERD (gastroesophageal reflux disease)    Gout    Heart disease    Hyperlipidemia    Hypertension    Leg pain    Overactive bladder    Allergies:  Allergies  Allergen Reactions   Ozempic (0.25 Or 0.5 Mg-Dose) [Semaglutide(0.25 Or 0.5mg -Dos)]    Entresto [Sacubitril-Valsartan] Other (See Comments)    "Dry mouth"     Surgical History:  She  has a past surgical history that includes Knee arthroscopy; Total knee arthroplasty (09/22/2011); Rotator cuff repair (Right, 03/23/13); Colonoscopy; Anterior cervical decomp/discectomy fusion (N/A, 08/03/2016); Colonoscopy with propofol (N/A, 02/24/2019); Esophagogastroduodenoscopy (egd) with propofol (N/A,  04/25/2021); and BIV ICD INSERTION CRT-D (N/A, 09/16/2021). Family History:  Her family history includes Asthma in her father; Cancer in her mother; Colon cancer in her mother; Diabetes in her father; Glaucoma in her father; Heart attack (age of onset: 42) in her father; Hypertension in her father; Kidney failure in her father.  REVIEW OF SYSTEMS  : All other systems reviewed and negative except where noted in the History of Present Illness.  PHYSICAL EXAM: LMP 06/17/2007  General Appearance: Well nourished, in no apparent distress. Head:   Normocephalic and atraumatic. Eyes:  sclerae anicteric,conjunctive pink  Respiratory: Respiratory effort normal, BS equal bilaterally without rales, rhonchi, wheezing. Cardio: RRR with no MRGs. Peripheral pulses intact.  Abdomen: Soft,  {BlankSingle:19197::"Flat","Obese","Non-distended"} ,active bowel sounds. {actendernessAB:27319} tenderness {anatomy; site abdomen:5010}. {BlankMultiple:19196::"Without guarding","With guarding","Without rebound","With rebound"}. No masses. Rectal: {acrectalexam:27461} Musculoskeletal: Full ROM, {PSY - GAIT AND STATION:22860} gait. {With/Without:304960234} edema. Skin:  Dry and intact without significant lesions or rashes Neuro: Alert and  oriented x4;  No focal deficits. Psych:  Cooperative. Normal mood and affect.    Doree Albee, PA-C 8:28 AM

## 2023-01-30 ENCOUNTER — Ambulatory Visit: Payer: Medicare Other | Admitting: Physician Assistant

## 2023-01-30 ENCOUNTER — Other Ambulatory Visit (HOSPITAL_COMMUNITY): Payer: Medicare Other

## 2023-02-13 DIAGNOSIS — L819 Disorder of pigmentation, unspecified: Secondary | ICD-10-CM | POA: Diagnosis not present

## 2023-02-20 ENCOUNTER — Ambulatory Visit: Payer: Medicare Other | Admitting: Family Medicine

## 2023-03-06 ENCOUNTER — Ambulatory Visit (HOSPITAL_COMMUNITY): Payer: Medicare Other

## 2023-03-12 ENCOUNTER — Encounter: Payer: Medicare Other | Admitting: Obstetrics and Gynecology

## 2023-03-18 LAB — CUP PACEART REMOTE DEVICE CHECK
Battery Remaining Longevity: 58 mo
Battery Remaining Percentage: 69 %
Battery Voltage: 2.98 V
Brady Statistic AP VP Percent: 1 %
Brady Statistic AP VS Percent: 1 %
Brady Statistic AS VP Percent: 99 %
Brady Statistic AS VS Percent: 1 %
Brady Statistic RA Percent Paced: 1 %
Date Time Interrogation Session: 20241002055419
HighPow Impedance: 78 Ohm
HighPow Impedance: 78 Ohm
Implantable Lead Connection Status: 753985
Implantable Lead Connection Status: 753985
Implantable Lead Connection Status: 753985
Implantable Lead Implant Date: 20230403
Implantable Lead Implant Date: 20230403
Implantable Lead Implant Date: 20230403
Implantable Lead Location: 753858
Implantable Lead Location: 753859
Implantable Lead Location: 753860
Implantable Pulse Generator Implant Date: 20230403
Lead Channel Impedance Value: 390 Ohm
Lead Channel Impedance Value: 510 Ohm
Lead Channel Impedance Value: 810 Ohm
Lead Channel Pacing Threshold Amplitude: 0.75 V
Lead Channel Pacing Threshold Amplitude: 0.75 V
Lead Channel Pacing Threshold Amplitude: 1.25 V
Lead Channel Pacing Threshold Pulse Width: 0.5 ms
Lead Channel Pacing Threshold Pulse Width: 0.5 ms
Lead Channel Pacing Threshold Pulse Width: 0.5 ms
Lead Channel Sensing Intrinsic Amplitude: 3.2 mV
Lead Channel Sensing Intrinsic Amplitude: 3.8 mV
Lead Channel Setting Pacing Amplitude: 1.75 V
Lead Channel Setting Pacing Amplitude: 2 V
Lead Channel Setting Pacing Amplitude: 2.5 V
Lead Channel Setting Pacing Pulse Width: 0.5 ms
Lead Channel Setting Pacing Pulse Width: 0.5 ms
Lead Channel Setting Sensing Sensitivity: 0.5 mV
Pulse Gen Serial Number: 9986369
Zone Setting Status: 755011

## 2023-03-19 ENCOUNTER — Ambulatory Visit (INDEPENDENT_AMBULATORY_CARE_PROVIDER_SITE_OTHER): Payer: Medicare Other

## 2023-03-19 DIAGNOSIS — I428 Other cardiomyopathies: Secondary | ICD-10-CM | POA: Diagnosis not present

## 2023-04-02 ENCOUNTER — Other Ambulatory Visit: Payer: Self-pay | Admitting: Family

## 2023-04-02 NOTE — Telephone Encounter (Signed)
Farxiga refill sent to pharmacy. Pt needs 3 month f/u on or soon after 04/22/23.  Sent mychart pt to message to schedule OV.

## 2023-04-03 NOTE — Progress Notes (Signed)
Remote ICD transmission.   

## 2023-04-08 ENCOUNTER — Telehealth: Payer: Self-pay | Admitting: Cardiology

## 2023-04-08 ENCOUNTER — Telehealth: Payer: Self-pay | Admitting: Family

## 2023-04-08 NOTE — Telephone Encounter (Signed)
Pt called asking if we had any samples of Farxiga available in the office and wanted to speak to Tammy about getting some.

## 2023-04-08 NOTE — Telephone Encounter (Signed)
Pt c/o medication issue:  1. Name of Medication: FARXIGA 10 MG TABS tablet   2. How are you currently taking this medication (dosage and times per day)?    3. Are you having a reaction (difficulty breathing--STAT)? no  4. What is your medication issue? Calling to see if we have any samples in the office. And if not can she go back on metoprolo because she can't afford [place]. Please advise

## 2023-04-08 NOTE — Telephone Encounter (Signed)
Please advise 

## 2023-04-09 ENCOUNTER — Other Ambulatory Visit: Payer: Self-pay | Admitting: Pharmacist

## 2023-04-09 ENCOUNTER — Other Ambulatory Visit (HOSPITAL_COMMUNITY): Payer: Self-pay

## 2023-04-09 ENCOUNTER — Encounter: Payer: Self-pay | Admitting: Pharmacist

## 2023-04-09 MED ORDER — ROSUVASTATIN CALCIUM 10 MG PO TABS: 10.0000 mg | ORAL_TABLET | Freq: Every day | ORAL | 3 refills | Status: AC

## 2023-04-09 NOTE — Telephone Encounter (Signed)
Spoke with patient regarding Briana Miller. She did not qualify this year for medication assistance program and our office did not have any samples of Farxiga to provide. She states she did speak to her cardiologist office and they are providing some samples.

## 2023-04-09 NOTE — Telephone Encounter (Signed)
Called and spoke with patient. Advised per message  below. Samples and patient assistance application placed up front for patient to pick up.      Patient can have Farxiga samples and a [place] from [place]. Metoprolol is not an alternative to [place].

## 2023-04-09 NOTE — Telephone Encounter (Signed)
Patient can have Farxiga samples and a patient [place] from [place]. Metoprolol is not an alternative to [place].

## 2023-04-09 NOTE — Progress Notes (Signed)
04/09/2023 Name: Akayla Braniff MRN: [id number] DOB: 1955-09-04  Chief Complaint  Patient presents with   Medication Management    Earna Caputi is a 67 y.o. year old female who presented for a telephone visit.   They were referred to the pharmacist by their PCP for assistance in managing medication access.    Subjective:  Medication Access/Adherence   Patient reports affordability concerns with their medications: Yes  Patient reports access/transportation concerns to their pharmacy: No  Patient reports adherence concerns with their medications:  No      Heart Failure:  Current medications:  ACEi/ARB/ARNI: losartan SGLT2i: Farxiga Beta blocker: carvedilol Mineralocorticoid Receptor Antagonist: spironolactone Diuretic regimen: torsemide  Patient has missed a few doses of Farxiga due to being in Medicare coverage gap. Cost of Marcelline Deist is > $150.  In 2023 we screened her for medication assistance program for both Farxiga and Jardiance but she was not eligible due to her husbands income.  Patient also has type 2 DM.   Current medication access support: none  Objective:  Lab Results  Component Value Date   HGBA1C 6.8 (H) 09/26/2022    Lab Results  Component Value Date   CREATININE 1.03 (H) 01/02/2023   BUN 15 01/02/2023   NA 141 01/02/2023   K 4.3 01/02/2023   CL 103 01/02/2023   CO2 24 01/02/2023    Lab Results  Component Value Date   CHOL 199 09/26/2022   HDL 62.30 09/26/2022   LDLCALC 114 (H) 09/26/2022   TRIG 112.0 09/26/2022   CHOLHDL 3 09/26/2022    Medications Reviewed Today   Medications were not reviewed in this encounter        Assessment/Plan:   Heart Failure: - Currently appropriately managed - Screened for 2024 medication assistance program for both Farxiga and Jardiance - household income was too high.  - Screened for [employer] for cardiomyopathy. Patient was eligible. Pre-approved for $10,000 from  04/09/2023 thru 04/07/2024. [employer] can be used for any medications for cardiomyopathy / heart failure. Called [facility] and provided information - patient's Marcelline Deist is not $0 - Reviewed to weigh daily and when to contact cardiology with weight gain - Continue to follow up with Dr Jens Som and to take Farxiga, torsemide, carvedilol, spironolactone.   - Updated rosuvastatin refills at [facility].  - Discussed 2025 Medicare changes. Provided number for [facility] office for patient to set up appointment to review 2025 options.   Follow Up Plan: 3 months  Henrene Pastor, PharmD Clinical Pharmacist [facility]  Population Health [phone]

## 2023-04-09 NOTE — Addendum Note (Signed)
Addended by: Henrene Pastor B on: 04/09/2023 02:14 PM   Modules accepted: Orders

## 2023-04-24 NOTE — Telephone Encounter (Signed)
Briana Miller,  Pt last seen by you in August and was recommended for pt to follow up with you in 3 months.  Pt saw Tammy for medication management. Just want to verify that you still need to see her in 3 month intervals?

## 2023-04-27 ENCOUNTER — Other Ambulatory Visit: Payer: Self-pay | Admitting: Family

## 2023-05-08 ENCOUNTER — Other Ambulatory Visit: Payer: Self-pay | Admitting: Family

## 2023-05-10 ENCOUNTER — Other Ambulatory Visit: Payer: Self-pay | Admitting: Cardiology

## 2023-05-10 ENCOUNTER — Encounter: Payer: Self-pay | Admitting: Pharmacist

## 2023-05-10 DIAGNOSIS — I428 Other cardiomyopathies: Secondary | ICD-10-CM

## 2023-05-10 NOTE — Progress Notes (Signed)
Pharmacy Quality Measure Review  This patient is appearing on a report for being at risk of failing the adherence measure for diabetes medications this calendar year. Although, she is on [place] for heart failure indication.  Medication: Farxiga 10 mg  Last fill date: 10/24 for 30 day supply  Contacted pharmacy to facilitate refills and ensure prescription was billed with [employer]. Confirmed $0 copay.  Jarrett Ables, PharmD PGY-1 Pharmacy Resident

## 2023-05-13 ENCOUNTER — Other Ambulatory Visit: Payer: Self-pay | Admitting: Family

## 2023-05-13 MED ORDER — NORTRIPTYLINE HCL 10 MG PO CAPS: ORAL_CAPSULE | ORAL | 1 refills | Status: DC

## 2023-05-28 ENCOUNTER — Telehealth: Payer: Self-pay | Admitting: Cardiology

## 2023-05-28 ENCOUNTER — Other Ambulatory Visit: Payer: Self-pay

## 2023-05-28 ENCOUNTER — Encounter: Payer: Self-pay | Admitting: Family

## 2023-05-28 ENCOUNTER — Telehealth: Payer: Self-pay | Admitting: Internal Medicine

## 2023-05-28 DIAGNOSIS — I428 Other cardiomyopathies: Secondary | ICD-10-CM

## 2023-05-28 NOTE — Telephone Encounter (Signed)
error 

## 2023-05-28 NOTE — Telephone Encounter (Signed)
Patient stated she been having a stinging feeling where her defib is placed. Patient stated the feeling comes and goes for 2 months now. Patient stated she has been having bad headaches, dizziness, and feeling off. Please advise.

## 2023-05-28 NOTE — Telephone Encounter (Signed)
Patient identification verified by 2 forms. Shade Flood, RN     Called and spoke to patient  Patient states: she would still like echo to be completed.              Interventions/Plan: Informed patient recent echo in September was cancelled due to not being covered by insurance. Patient is aware and said insurance covers the echo being done at [facility] or [facility]. Per [facility] team current echo order no longer in active request que so new order has to be placed. ECHO complete ordered today. Patient aware scheduling will reach out with date and time.   Patient agrees with plan, no further questions at this time

## 2023-05-28 NOTE — Telephone Encounter (Signed)
Patient was calling to schedule an appt for an echo. There is not an order placed yet. Please advise.

## 2023-05-28 NOTE — Telephone Encounter (Signed)
Spoke with pt who complains of intermittent stinging sensation at defib site x 2 months.  Pt denies current fever, chills or chest pain.  Pt reports she takes medication for her headaches which resolves them.  She states when she gets the stinging feeling around her device it "just makes me nervous."  Pt does not have any current vital signs.  She is taking medications as prescribed. Pt is past due f/u with Dr Ladona Ridgel.  Requested she send a transmission for review.  Appointment scheduled with Otilio Saber, PA-C for 06/05/2023.  Will forward to device clinic to review transmission.  Pt verbalizes understanding and agrees with current plan.

## 2023-05-29 NOTE — Telephone Encounter (Signed)
Outreach made to Pt.  Advised transmission sent yesterday was not received.  Walked Pt through sending a transmission.    Await transmission and return Pt call.

## 2023-05-29 NOTE — Telephone Encounter (Signed)
Transmission received.  Device function WNL.  Pt has scheduled follow up.

## 2023-05-29 NOTE — Telephone Encounter (Signed)
Transmission received. Pt in normal sinus rhythm. Everything looks good.

## 2023-06-03 ENCOUNTER — Other Ambulatory Visit: Payer: Self-pay | Admitting: Family

## 2023-06-03 NOTE — Progress Notes (Deleted)
  Electrophysiology Office Note:   ID:  Briana, Miller 02/02/1956, MRN [id number]  Primary Cardiologist: Olga Millers, MD Electrophysiologist: Lewayne Bunting, MD *** {Click to update primary MD,subspecialty MD or APP then REFRESH:1}    History of Present Illness:   Briana Miller is a 67 y.o. female with h/o *** seen today for {VISITTYPE:28148}  Review of systems complete and found to be negative unless listed in HPI.   EP Information / Studies Reviewed:    {EKGtoday:28818}       ICD Interrogation-  reviewed in detail today,  See PACEART report.  Device History: {INDUSTRY:28136} {Blank single:19197::"Dual Chamber","Single Chamber","BiV","S-ICD"} ICD implanted *** for *** History of appropriate therapy: {yes/no:20286} History of AAD therapy: {Blank single:19197::"No","Yes; currently on ***","Yes; previously tolerated ***"}   ***  Physical Exam:   VS:  LMP 06/17/2007    Wt Readings from Last 3 Encounters:  01/02/23 277 lb (125.6 kg)  12/11/21 277 lb (125.6 kg)  10/17/21 225 lb (102.1 kg)     GEN: Well nourished, well developed in no acute distress NECK: No JVD; No carotid bruits CARDIAC: {EPRHYTHM:28826}, no murmurs, rubs, gallops RESPIRATORY:  Clear to auscultation without rales, wheezing or rhonchi  ABDOMEN: Soft, non-tender, non-distended EXTREMITIES:  No edema; No deformity   ASSESSMENT AND PLAN:    {Blank single:19197::"Ventricular arrhythmia","Aborted Cardiac Arrest","Chronic systolic CHF","HCM"}  s/p {INDUSTRY:28136} {Blank single:19197::"***","single chamber ICD","dual chamber ICD","CRT-D","S-ICD"}  euvolemic today Stable on an appropriate medical regimen Normal ICD function See Pace Art report No changes today  Disposition:   Follow up with {EPPROVIDERS:28135} {EPFOLLOW UP:28173}   Signed, Graciella Freer, PA-C

## 2023-06-04 ENCOUNTER — Other Ambulatory Visit: Payer: Self-pay | Admitting: Cardiovascular Disease

## 2023-06-04 ENCOUNTER — Other Ambulatory Visit: Payer: Self-pay | Admitting: Family

## 2023-06-05 ENCOUNTER — Ambulatory Visit: Payer: Medicare Other | Admitting: Student

## 2023-06-14 ENCOUNTER — Other Ambulatory Visit: Payer: Self-pay | Admitting: Internal Medicine

## 2023-06-16 ENCOUNTER — Other Ambulatory Visit: Payer: Self-pay

## 2023-06-16 ENCOUNTER — Other Ambulatory Visit: Payer: Self-pay | Admitting: Family

## 2023-06-18 ENCOUNTER — Other Ambulatory Visit: Payer: Self-pay | Admitting: Family

## 2023-06-19 ENCOUNTER — Ambulatory Visit (INDEPENDENT_AMBULATORY_CARE_PROVIDER_SITE_OTHER): Payer: Medicare Other

## 2023-06-19 DIAGNOSIS — I428 Other cardiomyopathies: Secondary | ICD-10-CM

## 2023-06-19 LAB — CUP PACEART REMOTE DEVICE CHECK
Battery Remaining Longevity: 56 mo
Battery Remaining Percentage: 66 %
Battery Voltage: 2.98 V
Brady Statistic AP VP Percent: 1 %
Brady Statistic AP VS Percent: 1 %
Brady Statistic AS VP Percent: 99 %
Brady Statistic AS VS Percent: 1 %
Brady Statistic RA Percent Paced: 1 %
Date Time Interrogation Session: 20250103032712
HighPow Impedance: 84 Ohm
HighPow Impedance: 84 Ohm
Implantable Lead Connection Status: 753985
Implantable Lead Connection Status: 753985
Implantable Lead Connection Status: 753985
Implantable Lead Implant Date: 20230403
Implantable Lead Implant Date: 20230403
Implantable Lead Implant Date: 20230403
Implantable Lead Location: 753858
Implantable Lead Location: 753859
Implantable Lead Location: 753860
Implantable Pulse Generator Implant Date: 20230403
Lead Channel Impedance Value: 400 Ohm
Lead Channel Impedance Value: 550 Ohm
Lead Channel Impedance Value: 940 Ohm
Lead Channel Pacing Threshold Amplitude: 0.75 V
Lead Channel Pacing Threshold Amplitude: 0.75 V
Lead Channel Pacing Threshold Amplitude: 1.25 V
Lead Channel Pacing Threshold Pulse Width: 0.5 ms
Lead Channel Pacing Threshold Pulse Width: 0.5 ms
Lead Channel Pacing Threshold Pulse Width: 0.5 ms
Lead Channel Sensing Intrinsic Amplitude: 3.8 mV
Lead Channel Sensing Intrinsic Amplitude: 5 mV
Lead Channel Setting Pacing Amplitude: 1.75 V
Lead Channel Setting Pacing Amplitude: 2 V
Lead Channel Setting Pacing Amplitude: 2.5 V
Lead Channel Setting Pacing Pulse Width: 0.5 ms
Lead Channel Setting Pacing Pulse Width: 0.5 ms
Lead Channel Setting Sensing Sensitivity: 0.5 mV
Pulse Gen Serial Number: 9986369
Zone Setting Status: 755011

## 2023-06-26 ENCOUNTER — Ambulatory Visit: Payer: Medicare Other | Admitting: Student

## 2023-06-28 ENCOUNTER — Other Ambulatory Visit: Payer: Self-pay | Admitting: Gastroenterology

## 2023-07-02 ENCOUNTER — Encounter: Payer: Self-pay | Admitting: Family

## 2023-07-02 ENCOUNTER — Other Ambulatory Visit: Payer: Self-pay | Admitting: Family

## 2023-07-10 ENCOUNTER — Ambulatory Visit (HOSPITAL_COMMUNITY): Payer: Medicare Other

## 2023-07-20 NOTE — Progress Notes (Deleted)
  Cardiology Office Note:  .   Date:  07/20/2023  ID:  Briana Miller, DOB 30-Aug-1955, MRN [id number] PCP: Daryl Setter, NP  [facility] Providers Cardiologist:  Redell Shallow, MD Electrophysiologist:  Danelle Birmingham, MD {  History of Present Illness: .   Briana Miller is a 68 y.o. female w/PMHx of *** DM, HTN, HLD, LBBB, NICM, chronic CHF, ICD  Saw Dr. Birmingham 03/21/22, pas s/p ICD implant, doing well, device functioning well, no changes made  Saw Dr. Shallow 01/02/23, she was pending knee surgery, felt optimized/reasonable to proceed.  Patient called with c/o stinging type discomfort at her ICD pocket  Today's visit is scheduled to evaluate ICD site/discomfort  ROS:   *** site? *** symptoms otherwise *** remote ?? NSVTs, AMS?? *** volume *** DM?  Device information SJM CRT-D implanted 09/16/21   Studies Reviewed: SABRA    EKG done today and reviewed by myself:  ***  DEVICE interrogation done today and reviewed by myself *** Battery and lead measurements are good ***   -- Echocardiogram in October of 2014 showed an ejection fraction of 40-45%, mild left ventricular hypertrophy and mild left atrial enlargement. -- Cardiac CTA August 2021 showed calcium  score of 0 and no coronary disease.  -- Echocardiogram June 2022 showed ejection fraction 30 to 35%, mild left ventricular enlargement, grade 1 diastolic dysfunction, moderate pulmonary hypertension, mild to moderate mitral regurgitation.   -- Cardiac MRI February 2023 showed ejection fraction 31% with septal dyskinesis consistent with left bundle branch block but no infiltrative process.    Risk Assessment/Calculations:    Physical Exam:   VS:  LMP 06/17/2007    Wt Readings from Last 3 Encounters:  01/02/23 277 lb (125.6 kg)  12/11/21 277 lb (125.6 kg)  10/17/21 225 lb (102.1 kg)    GEN: Well nourished, well developed in no acute distress NECK: No JVD; No carotid bruits CARDIAC: ***RRR, no  murmurs, rubs, gallops RESPIRATORY:  *** CTA b/l without rales, wheezing or rhonchi  ABDOMEN: Soft, non-tender, non-distended EXTREMITIES:  *** No edema; No deformity   ICD site: *** is stable, no thinning, fluctuation, tethering  ASSESSMENT AND PLAN: .    CRT-D *** intact function *** no programming changes made *** site  NICM Chronic CHF Intolerant of Entresto  2/2 dry mouth *** C/w Dr. Harley  HTN ***     {Are you ordering a CV Procedure (e.g. stress test, cath, DCCV, TEE, etc)?   Press F2        :[id number]}     Dispo: ***  Signed, Charlies Macario Arthur, PA-C

## 2023-07-21 NOTE — Telephone Encounter (Signed)
 Copied from CRM [id number]. Topic: Clinical - Medication Refill >> Jul 21, 2023 11:01 AM Corin V wrote: Most Recent Primary Care Visit:  Provider: O'SULLIVAN, [name]  Department: [facility]  Visit Type: MYCHART VIDEO VISIT  Date: 01/20/2023  Medication: Semaglutide ,0.25 or 0.5MG /DOS, (OZEMPIC , 0.25 OR 0.5 MG/DOSE,) 2 MG/3ML SOPN- pharmacy requesting refill  Has the patient contacted their pharmacy? Yes (Agent: If no, request that the patient contact the pharmacy for the refill. If patient does not wish to contact the pharmacy document the reason why and proceed with request.) (Agent: If yes, when and what did the pharmacy advise?)  Is this the correct pharmacy for this prescription? Yes If no, delete pharmacy and type the correct one.  This is the patient's preferred pharmacy:  [facility]) - [place], [place] - [address] [place] [address] Phone: [phone] Fax: [phone]  Has the prescription been filled recently? No  Is the patient out of the medication? No  Has the patient been seen for an appointment in the last year OR does the patient have an upcoming appointment? Yes  Can we respond through MyChart? No  Agent: Please be advised that Rx refills may take up to 3 business days. We ask that you follow-up with your pharmacy.

## 2023-07-22 ENCOUNTER — Telehealth (INDEPENDENT_AMBULATORY_CARE_PROVIDER_SITE_OTHER): Payer: Medicare Other | Admitting: Family

## 2023-07-22 ENCOUNTER — Ambulatory Visit (HOSPITAL_COMMUNITY): Payer: Medicare Other

## 2023-07-22 ENCOUNTER — Ambulatory Visit (HOSPITAL_COMMUNITY): Payer: Medicare Other | Admitting: Physician Assistant

## 2023-07-22 DIAGNOSIS — J069 Acute upper respiratory infection, unspecified: Secondary | ICD-10-CM

## 2023-07-22 MED ORDER — BENZONATATE 100 MG PO CAPS: 100.0000 mg | ORAL_CAPSULE | Freq: Three times a day (TID) | ORAL | 0 refills | Status: DC | PRN

## 2023-07-22 NOTE — Progress Notes (Signed)
 MyChart Video Visit    Virtual Visit via Video Note    Patient location: Home. Patient and provider in visit Provider location: Office  I discussed the limitations of evaluation and management by telemedicine and the availability of in person appointments. The patient expressed understanding and agreed to proceed.  Visit Date: 07/22/2023  Today's healthcare provider: Eleanor GORMAN Ponto, NP     Subjective:    Patient ID: Briana Miller, female    DOB: [birth year], 68 y.o.   MRN: [id number]  Chief Complaint  Patient presents with   Cough    Complains of having a productive cough for 2 days   Headache    Complains of headache that started yesterday    Nasal Congestion    Patient complains of congestion     Cough Associated symptoms include headaches.  Headache  Associated symptoms include coughing.   Briana Miller is a 68 year old female who presents with a cough and headache.  She has been experiencing a productive cough for the past two days, producing yellowish sputum. She has been using Robitussin to manage the cough but finds it insufficient.  She also has intermittent headaches over the same two-day period. No fever is present, but she has body aches, particularly noticeable this morning.  No other significant symptoms have been noted, and she has not tried any other over-the-counter medications besides Robitussin. Past Medical History:  Diagnosis Date   Acute systolic congestive heart failure (HCC) 03/01/2014   Allergy    Anemia    Arthritis    Cardiomyopathy (HCC)    Cervical myelopathy (HCC)    CHF (congestive heart failure) (HCC)    GERD (gastroesophageal reflux disease)    Gout    Heart disease    Hyperlipidemia    Hypertension    Leg pain    Overactive bladder     Past Surgical History:  Procedure Laterality Date   ANTERIOR CERVICAL DECOMP/DISCECTOMY FUSION N/A 08/03/2016   Procedure: ANTERIOR CERVICAL  DECOMPRESSION/DISCECTOMY FUSION C4-5, C5-6;  Surgeon: Victory Gens, MD;  Location: [facility] OR;  Service: Neurosurgery;  Laterality: N/A;   BIV ICD INSERTION CRT-D N/A 09/16/2021   Procedure: BIV ICD INSERTION CRT-D;  Surgeon: Waddell Danelle ORN, MD;  Location: [facility] INVASIVE CV LAB;  Service: Cardiovascular;  Laterality: N/A;   COLONOSCOPY     COLONOSCOPY WITH PROPOFOL  N/A 02/24/2019   Procedure: COLONOSCOPY WITH PROPOFOL ;  Surgeon: Teressa Toribio SQUIBB, MD;  Location: [facility] ENDOSCOPY;  Service: Endoscopy;  Laterality: N/A;   ESOPHAGOGASTRODUODENOSCOPY (EGD) WITH PROPOFOL  N/A 04/25/2021   Procedure: ESOPHAGOGASTRODUODENOSCOPY (EGD) WITH PROPOFOL ;  Surgeon: Teressa Toribio SQUIBB, MD;  Location: [facility] ENDOSCOPY;  Service: Endoscopy;  Laterality: N/A;   KNEE ARTHROSCOPY     2007   ROTATOR CUFF REPAIR Right 03/23/13   TOTAL KNEE ARTHROPLASTY  09/22/2011   Procedure: TOTAL KNEE ARTHROPLASTY;  Surgeon: Garnette JONETTA Raman, MD;  Location: [facility] OR;  Service: Orthopedics;  Laterality: Right;    Family History  Problem Relation Age of Onset   Cancer Mother        colon 29 and breast 33- deceased   Colon cancer Mother    Diabetes Father        living   Asthma Father    Hypertension Father    Heart attack Father 84       medical management per pt   Glaucoma Father        had eye transplant   Kidney failure Father  acute renal failure- died 15   Esophageal cancer Neg Hx    Stomach cancer Neg Hx    Rectal cancer Neg Hx     Social History   Socioeconomic History   Marital status: Married    Spouse name: Not on file   Number of children: 2   Years of education: HS   Highest education level: Some college, no degree  Occupational History   Occupation: Acupuncturist: [employer]  Tobacco Use   Smoking status: Never   Smokeless tobacco: Never  Vaping Use   Vaping status: Never Used  Substance and Sexual Activity   Alcohol use: Not Currently    Alcohol/week: 2.0 standard drinks of alcohol    Types: 2  Glasses of wine per week   Drug use: No   Sexual activity: Not on file  Other Topics Concern   Not on file  Social History Narrative   Married- 32 years   2 children- grown daughter- lives in [place]- has 2 children   Grown son lives in [place]- 4 children   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   1-2 cups caffeine daily.   Lives at home with her husband.   Social Drivers of Corporate Investment Banker Strain: Low Risk  (07/22/2023)   Overall Financial Resource Strain (CARDIA)    Difficulty of Paying Living Expenses: Not hard at all  Food Insecurity: No Food Insecurity (07/22/2023)   Hunger Vital Sign    Worried About Running Out of Food in the Last Year: Never true    Ran Out of Food in the Last Year: Never true  Transportation Needs: No Transportation Needs (07/22/2023)   PRAPARE - Administrator, Civil Service (Medical): No    Lack of Transportation (Non-Medical): No  Physical Activity: Unknown (07/22/2023)   Exercise Vital Sign    Days of Exercise per Week: 0 days    Minutes of Exercise per Session: Not on file  Stress: No Stress Concern Present (07/22/2023)   [employer] of Occupational Health - Occupational Stress Questionnaire    Feeling of Stress : Only a little  Social Connections: Moderately Isolated (07/22/2023)   Social Connection and Isolation Panel [NHANES]    Frequency of Communication with Friends and Family: Once a week    Frequency of Social Gatherings with Friends and Family: Once a week    Attends Religious Services: 1 to 4 times per year    Active Member of [employer] or Organizations: No    Attends Engineer, Structural: Not on file    Marital Status: Married  Catering Manager Violence: Not on file    Outpatient Medications Prior to Visit  Medication Sig Dispense Refill   Accu-Chek Softclix Lancets lancets TEST UP TO 4 TIMES DAILY 400  each 1   acetaminophen  (TYLENOL ) 500 MG tablet Take 1,000 mg by mouth every 8 (eight) hours as needed for moderate pain or headache.     albuterol  (VENTOLIN  HFA) 108 (90 Base) MCG/ACT inhaler USE 2 INHALATIONS BY MOUTH EVERY 6 HOURS AS NEEDED 34 g 2   allopurinol  (ZYLOPRIM ) 100 MG tablet TAKE 1 TABLET BY MOUTH DAILY 100 tablet 2   Blood Glucose Monitoring Suppl (ACCU-CHEK GUIDE) w/Device KIT USE AS DIRECTED 1 kit 0   carvedilol  (COREG ) 12.5 MG tablet TAKE 1 TABLET BY MOUTH TWICE  DAILY 180 tablet 3   celecoxib  (CELEBREX ) 100 MG capsule Take 100 mg by mouth daily as needed for pain.     cholecalciferol (VITAMIN D3) 25 MCG (1000 UNIT) tablet Take 1,000 Units by mouth daily.     famotidine  (PEPCID ) 40 MG tablet TAKE 1 TABLET BY MOUTH TWICE  DAILY 200 tablet 2   FARXIGA  10 MG TABS tablet TAKE 1 TABLET(10 MG) BY MOUTH DAILY BEFORE BREAKFAST 90 tablet 0   fluticasone  (FLONASE ) 50 MCG/ACT nasal spray SHAKE LIQUID AND USE 2 SPRAYS IN EACH NOSTRIL DAILY 16 g 1   gabapentin  (NEURONTIN ) 300 MG capsule TAKE 3 CAPSULES BY MOUTH 3 TIMES DAILY (Patient taking differently: 600 mg 2 (two) times daily.) 900 capsule 2   glucose blood (ACCU-CHEK GUIDE) test strip USE UP TO 4 TIMES DAILY 400 strip 1   latanoprost (XALATAN) 0.005 % ophthalmic solution Place 1 drop into both eyes at bedtime.   11   losartan  (COZAAR ) 25 MG tablet TAKE 1 TABLET BY MOUTH DAILY 100 tablet 2   montelukast  (SINGULAIR ) 10 MG tablet TAKE 1 TABLET BY MOUTH AT  BEDTIME 90 tablet 1   nortriptyline  (PAMELOR ) 10 MG capsule Take 1 capsule by mouth at bedtime for a week, then 2 capsules at bedtime. 180 capsule 1   nystatin  (NYAMYC ) powder APPLY TOPICALLY TO THE AFFECTED  AREA 3 TIMES DAILY  AS NEEDED FOR RASH 300 g 1   ondansetron  (ZOFRAN ) 4 MG tablet TAKE 1 TABLET BY MOUTH EVERY 8  HOURS AS NEEDED FOR NAUSEA AND  VOMITING 270 tablet 0   pantoprazole  (PROTONIX ) 40 MG tablet TAKE 1 TABLET BY MOUTH TWICE  DAILY 200 tablet 2   potassium chloride  SA  (KLOR-CON  M) 20 MEQ tablet Take 1 tablet (20 mEq total) by mouth daily. 90 tablet 0   rosuvastatin  (CRESTOR ) 10 MG tablet Take 1 tablet (10 mg total) by mouth daily. For cholesterol and heart. 90 tablet 3   spironolactone  (ALDACTONE ) 25 MG tablet TAKE 1 TABLET BY MOUTH DAILY 90 tablet 3   Tart Cherry 1200 MG CAPS Take 1,200 mg by mouth 2 (two) times a week.     timolol (BETIMOL) 0.5 % ophthalmic solution Place 1 drop into both eyes every morning.      torsemide  (DEMADEX ) 20 MG tablet TAKE 2 TABLETS BY MOUTH  ALTERNATING WITH 1 TABLET BY  MOUTH EVERY OTHER DAY 135 tablet 3   No facility-administered medications prior to visit.    Allergies  Allergen Reactions   Ozempic  (0.25 Or 0.5 Mg-Dose) [Semaglutide (0.25 Or 0.5mg -Dos)]    Entresto  [Sacubitril-Valsartan] Other (See Comments)    Dry mouth    Review of Systems  Respiratory:  Positive for cough.   Neurological:  Positive for headaches.       Objective:    Physical Exam  LMP 06/17/2007  Wt Readings from Last 3 Encounters:  01/02/23 277 lb (125.6 kg)  12/11/21 277 lb (125.6 kg)  10/17/21 225 lb (102.1 kg)    Gen: Awake, alert, no acute distress Resp: Breathing is even and non-labored Psych: calm/pleasant demeanor Neuro: Alert and Oriented x 3, + facial symmetry, speech is clear.     Assessment & Plan:   Problem List Items Addressed This Visit   None Visit Diagnoses       Viral URI with cough    -  Primary      Symptoms of cough with yellow sputum, headache, and body aches for 2 days. Likely viral etiology. No fever  reported. -Recommend that she purchas and perform over-the-counter COVID and flu swab test to determine if specific antiviral treatment is needed. Notify me if positive. -Prescribe tessalon  PRN be picked up at [facility] on [address] -Advise rest and hydration. -If symptoms worsen (e.g., high fever, shortness of breath, worsening cough), patient should return to the office for further  evaluation and examination.  I am having Briana Miller start on benzonatate . I am also having her maintain her latanoprost, timolol, celecoxib , cholecalciferol, Tart Cherry, acetaminophen , Accu-Chek Guide, spironolactone , torsemide , pantoprazole , gabapentin , famotidine , albuterol , Accu-Chek Guide, Accu-Chek Softclix Lancets, Farxiga , rosuvastatin , allopurinol , nystatin , losartan , nortriptyline , montelukast , potassium chloride  SA, carvedilol , fluticasone , and ondansetron .  Meds ordered this encounter  Medications   benzonatate  (TESSALON ) 100 MG capsule    Sig: Take 1 capsule (100 mg total) by mouth 3 (three) times daily as needed.    Dispense:  20 capsule    Refill:  0    Supervising Provider:   DOMENICA BLACKBIRD A [[id number]]    I discussed the assessment and treatment plan with the patient. The patient was provided an opportunity to ask questions and all were answered. The patient agreed with the plan and demonstrated an understanding of the instructions.   The patient was advised to call back or seek an in-person evaluation if the symptoms worsen or if the condition fails to improve as anticipated.   Eleanor GORMAN Ponto, NP [facility] [phone] (phone) [phone] (fax)  [facility]

## 2023-07-22 NOTE — Patient Instructions (Signed)
 VISIT SUMMARY:  Briana Miller, a 68 year old female, visited today due to a productive cough and intermittent headaches over the past two days. She has been using Robitussin but finds it insufficient. No fever is present, but she has body aches, particularly noticeable this morning.  YOUR PLAN:  -UPPER RESPIRATORY INFECTION: An upper respiratory infection is a viral infection that affects the nose, throat, and airways. [name] has symptoms of a cough with yellow sputum, headache, and body aches for 2 days. It is likely viral given the current prevalence of flu and COVID-19. We recommend taking an over-the-counter COVID and flu swab test to determine if specific antiviral treatment is needed. A stronger cough suppressant has been prescribed and can be picked up at [facility] on [address] and [address]. Rest and hydration are advised. If symptoms worsen, such as developing a high fever, shortness of breath, or a worsening cough, please return to the office for further evaluation.   INSTRUCTIONS:  Please take an over-the-counter COVID and flu swab test and let me know if you test positive. Pick up the prescribed stronger cough suppressant at [facility] on [address] and [address]. Ensure you get plenty of rest and stay hydrated. If your symptoms worsen, such as developing a high fever, shortness of breath, or a worsening cough, return to the office for further evaluation.

## 2023-07-24 ENCOUNTER — Other Ambulatory Visit: Payer: Self-pay | Admitting: Family

## 2023-07-24 MED ORDER — DAPAGLIFLOZIN PROPANEDIOL 10 MG PO TABS: 10.0000 mg | ORAL_TABLET | Freq: Every day | ORAL | 0 refills | Status: DC

## 2023-07-28 NOTE — Progress Notes (Signed)
Remote ICD transmission.

## 2023-07-30 NOTE — Telephone Encounter (Signed)
Can you please contact patient and ask her if she stopped ozempic and wants to restart?

## 2023-07-30 NOTE — Telephone Encounter (Signed)
Please advise- Ozempic no longer on med list?

## 2023-08-05 ENCOUNTER — Ambulatory Visit (HOSPITAL_COMMUNITY): Payer: Medicare Other

## 2023-08-07 NOTE — Telephone Encounter (Signed)
I have pended the rx's. Can you please see if she would like the amoxicillin local? I am assuming she wants the other two to [facility]? tks

## 2023-08-11 ENCOUNTER — Other Ambulatory Visit: Payer: Self-pay | Admitting: Gastroenterology

## 2023-08-11 MED ORDER — AMOXICILLIN 500 MG PO CAPS: 1000.0000 mg | ORAL_CAPSULE | Freq: Once | ORAL | 0 refills | Status: AC

## 2023-08-11 MED ORDER — SCOPOLAMINE 1 MG/3DAYS TD PT72: 1.0000 | MEDICATED_PATCH | TRANSDERMAL | 0 refills | Status: DC

## 2023-08-11 MED ORDER — SEMAGLUTIDE(0.25 OR 0.5MG/DOS) 2 MG/3ML ~~LOC~~ SOPN: 0.2500 mg | PEN_INJECTOR | SUBCUTANEOUS | 0 refills | Status: DC

## 2023-08-17 ENCOUNTER — Other Ambulatory Visit: Payer: Self-pay | Admitting: Gastroenterology

## 2023-08-23 NOTE — Progress Notes (Deleted)
  Cardiology Office Note:  .   Date:  08/23/2023  ID:  Briana Miller, DOB 02-26-1956, MRN [id number] PCP: Sandford Craze, NP  [facility] Providers Cardiologist:  Olga Millers, MD Electrophysiologist:  Lewayne Bunting, MD {  History of Present Illness: .   Briana Miller is a 68 y.o. female w/PMHx of  NICM, chronic CHF, LBBB > CRT-D  Saw Dr. Ladona Ridgel 03/21/22, doing well, no changes were made  Saw Dr Jens Som 01/02/23, reported SOB with more vigorous activities, no CP, edema, orthopnea or PND Planned for updated echo  Echo not done >> eventually scheduled for 09/30/23  Dec 2024 there are phone notes about a stinging at her ICD, headache mediation seemed to help it Transmission sent with normal function  Today's visit is scheduled to evaluate pain at devic site  ROS:   *** site *** device only  Device information SJM CRT-D implanted 09/16/21   Studies Reviewed: Marland Kitchen    EKG not done today  DEVICE interrogation done today and reviewed by myself *** Battery and lead measurements are good ***    Cardiac CTA August 2021 showed calcium score of 0 and no coronary disease.   Echocardiogram June 2022 showed ejection fraction 30 to 35%, mild left ventricular enlargement, grade 1 diastolic dysfunction, moderate pulmonary hypertension, mild to moderate mitral regurgitation.   Cardiac MRI February 2023 showed ejection fraction 31% with septal dyskinesis consistent with left bundle branch block but no infiltrative process.    Risk Assessment/Calculations:    Physical Exam:   VS:  LMP 06/17/2007    Wt Readings from Last 3 Encounters:  01/02/23 277 lb (125.6 kg)  12/11/21 277 lb (125.6 kg)  10/17/21 225 lb (102.1 kg)    GEN: Well nourished, well developed in no acute distress NECK: No JVD; No carotid bruits CARDIAC: ***RRR, no murmurs, rubs, gallops RESPIRATORY:  *** CTA b/l without rales, wheezing or rhonchi  ABDOMEN: Soft, non-tender,  non-distended EXTREMITIES:  No edema; No deformity   PPM/ICD/ILR site: *** is stable, no thinning, fluctuation, tethering  ASSESSMENT AND PLAN: .    CRT-D *** intact function *** no programming changes made  NICM Chronic CHF (systolic) *** C/w Dr. Ala Bent     {Are you ordering a CV Procedure (e.g. stress test, cath, DCCV, TEE, etc)?   Press F2        :[id number]}     Dispo: ***  Signed, Sheilah Pigeon, PA-C

## 2023-08-24 ENCOUNTER — Other Ambulatory Visit: Payer: Self-pay | Admitting: Gastroenterology

## 2023-08-26 ENCOUNTER — Other Ambulatory Visit: Payer: Self-pay | Admitting: Family

## 2023-08-26 ENCOUNTER — Ambulatory Visit: Payer: Medicare Other | Admitting: Physician Assistant

## 2023-08-31 ENCOUNTER — Encounter: Payer: Medicare Other | Admitting: Obstetrics and Gynecology

## 2023-09-03 ENCOUNTER — Other Ambulatory Visit: Payer: Self-pay

## 2023-09-03 DIAGNOSIS — R601 Generalized edema: Secondary | ICD-10-CM

## 2023-09-03 DIAGNOSIS — I5042 Chronic combined systolic (congestive) and diastolic (congestive) heart failure: Secondary | ICD-10-CM

## 2023-09-03 MED ORDER — TORSEMIDE 20 MG PO TABS: ORAL_TABLET | ORAL | 1 refills | Status: DC

## 2023-09-03 NOTE — Addendum Note (Signed)
 Addended by: Margaret Pyle D on: 09/03/2023 12:31 PM   Modules accepted: Orders

## 2023-09-08 ENCOUNTER — Ambulatory Visit: Admitting: Family

## 2023-09-09 ENCOUNTER — Ambulatory Visit: Admitting: Family

## 2023-09-15 ENCOUNTER — Other Ambulatory Visit: Payer: Self-pay | Admitting: Family

## 2023-09-21 ENCOUNTER — Ambulatory Visit (INDEPENDENT_AMBULATORY_CARE_PROVIDER_SITE_OTHER): Payer: Medicare Other

## 2023-09-21 DIAGNOSIS — I447 Left bundle-branch block, unspecified: Secondary | ICD-10-CM

## 2023-09-21 DIAGNOSIS — I428 Other cardiomyopathies: Secondary | ICD-10-CM

## 2023-09-21 LAB — CUP PACEART REMOTE DEVICE CHECK
Battery Remaining Longevity: 53 mo
Battery Remaining Percentage: 62 %
Battery Voltage: 2.98 V
Brady Statistic AP VP Percent: 1 %
Brady Statistic AP VS Percent: 1 %
Brady Statistic AS VP Percent: 99 %
Brady Statistic AS VS Percent: 1 %
Brady Statistic RA Percent Paced: 1 %
Date Time Interrogation Session: 20250407020017
HighPow Impedance: 81 Ohm
HighPow Impedance: 81 Ohm
Implantable Lead Connection Status: 753985
Implantable Lead Connection Status: 753985
Implantable Lead Connection Status: 753985
Implantable Lead Implant Date: 20230403
Implantable Lead Implant Date: 20230403
Implantable Lead Implant Date: 20230403
Implantable Lead Location: 753858
Implantable Lead Location: 753859
Implantable Lead Location: 753860
Implantable Pulse Generator Implant Date: 20230403
Lead Channel Impedance Value: 410 Ohm
Lead Channel Impedance Value: 550 Ohm
Lead Channel Impedance Value: 940 Ohm
Lead Channel Pacing Threshold Amplitude: 0.75 V
Lead Channel Pacing Threshold Amplitude: 0.75 V
Lead Channel Pacing Threshold Amplitude: 1.25 V
Lead Channel Pacing Threshold Pulse Width: 0.5 ms
Lead Channel Pacing Threshold Pulse Width: 0.5 ms
Lead Channel Pacing Threshold Pulse Width: 0.5 ms
Lead Channel Sensing Intrinsic Amplitude: 5 mV
Lead Channel Sensing Intrinsic Amplitude: 5.8 mV
Lead Channel Setting Pacing Amplitude: 1.75 V
Lead Channel Setting Pacing Amplitude: 2 V
Lead Channel Setting Pacing Amplitude: 2.5 V
Lead Channel Setting Pacing Pulse Width: 0.5 ms
Lead Channel Setting Pacing Pulse Width: 0.5 ms
Lead Channel Setting Sensing Sensitivity: 0.5 mV
Pulse Gen Serial Number: 9986369
Zone Setting Status: 755011

## 2023-09-22 ENCOUNTER — Other Ambulatory Visit: Payer: Self-pay | Admitting: Family

## 2023-09-22 ENCOUNTER — Encounter: Payer: Self-pay | Admitting: Internal Medicine

## 2023-09-23 ENCOUNTER — Ambulatory Visit: Admitting: Family

## 2023-09-30 ENCOUNTER — Ambulatory Visit (HOSPITAL_COMMUNITY)
Admission: RE | Admit: 2023-09-30 | Discharge: 2023-09-30 | Disposition: A | Discharge status: Home / Self Care | Payer: Medicare Other | Source: Ambulatory Visit | Attending: Cardiology | Admitting: Cardiology

## 2023-09-30 DIAGNOSIS — G4730 Sleep apnea, unspecified: Secondary | ICD-10-CM | POA: Diagnosis not present

## 2023-09-30 DIAGNOSIS — I081 Rheumatic disorders of both mitral and tricuspid valves: Secondary | ICD-10-CM | POA: Insufficient documentation

## 2023-09-30 DIAGNOSIS — E119 Type 2 diabetes mellitus without complications: Secondary | ICD-10-CM | POA: Insufficient documentation

## 2023-09-30 DIAGNOSIS — I428 Other cardiomyopathies: Secondary | ICD-10-CM

## 2023-09-30 DIAGNOSIS — I10 Essential (primary) hypertension: Secondary | ICD-10-CM | POA: Insufficient documentation

## 2023-09-30 LAB — ECHOCARDIOGRAM COMPLETE
AR max vel: 1.76 cm2
AV Peak grad: 9.7 mmHg
Ao pk vel: 1.56 m/s
Area-P 1/2: 3.3 cm2
MV M vel: 5.24 m/s
MV Peak grad: 109.8 mmHg
MV VTI: 2.53 cm2
Radius: 0.5 cm
S' Lateral: 3.9 cm

## 2023-10-01 ENCOUNTER — Encounter: Payer: Self-pay | Admitting: *Deleted

## 2023-10-01 ENCOUNTER — Other Ambulatory Visit: Payer: Self-pay | Admitting: *Deleted

## 2023-10-01 ENCOUNTER — Other Ambulatory Visit: Payer: Self-pay | Admitting: Family

## 2023-10-01 DIAGNOSIS — I059 Rheumatic mitral valve disease, unspecified: Secondary | ICD-10-CM

## 2023-10-14 ENCOUNTER — Other Ambulatory Visit (HOSPITAL_COMMUNITY)
Admission: RE | Admit: 2023-10-14 | Discharge: 2023-10-14 | Disposition: A | Discharge status: Home / Self Care | Source: Ambulatory Visit | Attending: Obstetrics and Gynecology | Admitting: Obstetrics and Gynecology

## 2023-10-14 ENCOUNTER — Ambulatory Visit: Payer: Medicare Other | Admitting: Obstetrics and Gynecology

## 2023-10-14 ENCOUNTER — Encounter: Payer: Self-pay | Admitting: Obstetrics and Gynecology

## 2023-10-14 VITALS — BP 121/85 | HR 83

## 2023-10-14 DIAGNOSIS — G8929 Other chronic pain: Secondary | ICD-10-CM

## 2023-10-14 DIAGNOSIS — R102 Pelvic and perineal pain: Secondary | ICD-10-CM

## 2023-10-14 DIAGNOSIS — Z1151 Encounter for screening for human papillomavirus (HPV): Secondary | ICD-10-CM | POA: Insufficient documentation

## 2023-10-14 DIAGNOSIS — Z01419 Encounter for gynecological examination (general) (routine) without abnormal findings: Secondary | ICD-10-CM | POA: Diagnosis not present

## 2023-10-14 DIAGNOSIS — Z124 Encounter for screening for malignant neoplasm of cervix: Secondary | ICD-10-CM

## 2023-10-14 NOTE — Progress Notes (Signed)
 ANNUAL EXAM Patient name: Briana Miller MRN [id number]  Date of birth: 02/12/1956 Chief Complaint:   No chief complaint on file.  History of Present Illness:   Briana Miller is a 68 y.o. No obstetric history on file. being seen today for a routine annual exam.  Current complaints: pap, intermittent pain  Menstrual concerns? No   Breast or nipple changes? No, due for Contraception use? Yes menopause Sexually active? Yes no pain with intercourse  Primarily left sided lower abdominal pain, not tirgguer by any partiuclar activity APAP to help; sometimes will take benadryl . Hx of prior total knee replacement in 2012 and told she will need another replacement. Completely wheelchair dependent at this time. Has been in the chair since 2018 due to significant arthritis in knee; pain has been ongoing for a while.   Patient's last menstrual period was 06/17/2007.   The pregnancy intention screening data noted above was reviewed. Potential methods of contraception were discussed. The patient elected to proceed with No data recorded.   Last pap No results found for: "DIAGPAP", "HPVHIGH", "ADEQPAP" Last mammogram: 08/2021 [facility] 1.  .      09/26/2022   11:18 AM 04/17/2022    9:15 AM 01/17/2022   11:31 AM 12/20/2021    1:41 PM 12/02/2019    1:53 PM  Depression screen PHQ 2/9  Decreased Interest 0 0 1 2 0  Down, Depressed, Hopeless 0 0 1 2 0  PHQ - 2 Score 0 0 2 4 0  Altered sleeping 0  0 2   Tired, decreased energy 0  1 3   Change in appetite 0  1 1   Feeling bad or failure about yourself  0  1 1   Trouble concentrating 0  0 1   Moving slowly or fidgety/restless 0  0 0   Suicidal thoughts 0  0 0   PHQ-9 Score 0  5 12   Difficult doing work/chores Not difficult at all  Somewhat difficult Somewhat difficult          No data to display           Review of Systems:   Pertinent items are noted in HPI Denies any headaches, blurred vision, fatigue, shortness of breath,  chest pain, abdominal pain, abnormal vaginal discharge/itching/odor/irritation, problems with periods, bowel movements, urination, or intercourse unless otherwise stated above. Pertinent History Reviewed:  Reviewed past medical,surgical, social and family history.  Reviewed problem list, medications and allergies. Physical Assessment:   Vitals:   10/14/23 1324  BP: 121/85  Pulse: 83  There is no height or weight on file to calculate BMI.        Physical Examination:   General appearance - well appearing, and in no distress  Mental status - alert, oriented to person, place, and time  Psych:  She has a normal mood and affect  Skin - warm and dry, normal color, no suspicious lesions noted  Chest - effort normal, all lung fields clear to auscultation bilaterally  Heart - normal rate and regular rhythm  Abdomen - soft, nontender, nondistended, no masses or organomegaly  Pelvic -  VULVA: normal appearing vulva with no masses, tenderness or lesions   VAGINA: normal appearing vagina with normal color and discharge, no lesions   CERVIX: normal appearing cervix without discharge or lesions, no CMT  Thin prep pap is done with HR HPV cotesting  UTERUS: uterus is felt to be normal size, shape, consistency and nontender  ADNEXA: No adnexal masses or tenderness noted.  Extremities:  No swelling or varicosities noted  Chaperone present for exam  No results found. However, due to the size of the patient record, not all encounters were searched. Please check Results Review for a complete set of results.    Assessment & Plan:  1. Well woman exam with routine gynecological exam (Primary) - Cervical cancer screening: Discussed guidelines related to screening beyond 65. Based on guidelines, the patient meets criteria for cessation of pap smears.  Patient would like:  to discontinue if normal - Breast Health: Encouraged self-breast awareness/SBE. Discussed limits of clinical breast exam for detecting  breast cancer. Discussed importance of annual MXR. Rx given for MXR - Colonoscopy: Per PCP - F/U 12 months and prn  - MM 3D SCREENING MAMMOGRAM BILATERAL BREAST; Future - Cytology - PAP  2. Screening for cervical cancer Pap collected - Cytology - PAP  3. Chronic pelvic pain in female May be due to altered/limited ambulation due to other MSK issues. Recommend re-assessment after ortho procedures are completed   No orders of the defined types were placed in this encounter.   Meds: No orders of the defined types were placed in this encounter.   Follow-up: No follow-ups on file.  Kiki Pelton, MD 10/14/2023 1:49 PM

## 2023-10-16 LAB — CYTOLOGY - PAP
Adequacy: ABSENT
Comment: NEGATIVE
Diagnosis: NEGATIVE
High risk HPV: NEGATIVE

## 2023-10-19 ENCOUNTER — Encounter: Payer: Self-pay | Admitting: Obstetrics and Gynecology

## 2023-10-20 DIAGNOSIS — Z0279 Encounter for issue of other medical certificate: Secondary | ICD-10-CM

## 2023-10-20 NOTE — Progress Notes (Unsigned)
 Cardiology Office Note:  .   Date:  10/20/2023  ID:  Briana Miller, DOB 12-30-1955, MRN [id number] PCP: Dorrene Gaucher, NP  [facility] Providers Cardiologist:  Alexandria Angel, MD Electrophysiologist:  Manya Sells, MD {  History of Present Illness: .   Briana Miller is a 68 y.o. female w/PMHx of  HTN, HLD, gout NICM,  -- Cardiac CTA August 2021 showed calcium  score of 0 and no coronary disease  -- Cardiac MRI February 2023 showed ejection fraction 31% with septal dyskinesis consistent with left bundle branch block but no infiltrative process   She saw Dre. Carolynne Citron 03/21/22, doing well post implant  She saw Dr. Kennon Peaks 01/02/23, doing well, no reported complaints/concerns, planned for updated labs and echo  LVEF 35-40% (30-35%, pre-device)  Today's visit is scheduled to f/u on c/o pain at device site (dec 2024) ROS:   She comes today in a motorized chair, says she hasn't walked for quite a long time 2/2 terrible knees She gets a random sharp and fleeting pain just above her ICD > locates physically more towards her shoulder, sometimes shoots down her arm, not always, happens rarely  No CP, palpitations Denies SOB No near syncope or syncope   Device information SJM CRT-D implanted 09/16/21 (+CS lead)  Studies Reviewed: Aaron Aas    EKG done today and reviewed by myself:  SR/V paced, QRS morphology is good,  DEVICE interrogation done today and reviewed by myself Battery and lead measurements are good + seven NSVTs, longest 6 seconds  + AMS Most are ATach and short 6-8 seconds A couple true AFlutters also seconds, though one did last 1 hour 23 minutes   09/30/23: TTE  1. Left ventricular ejection fraction, by estimation, is 35 to 40%. The  left ventricle has moderately decreased function. The left ventricle  demonstrates global hypokinesis. The left ventricular internal cavity size  was mildly dilated. Left ventricular  diastolic parameters  are consistent with Grade I diastolic dysfunction  (impaired relaxation).   2. Right ventricular systolic function is mildly reduced. The right  ventricular size is normal. There is normal pulmonary artery systolic  pressure.   3. Left atrial size was mild to moderately dilated.   4. The mitral valve is normal in structure. Moderate to severe mitral  valve regurgitation. No evidence of mitral stenosis.   5. Tricuspid valve regurgitation is moderate.   6. The aortic valve is normal in structure. Aortic valve regurgitation is  not visualized. No aortic stenosis is present.   7. The inferior vena cava is normal in size with greater than 50%  respiratory variability, suggesting right atrial pressure of 3 mmHg.    Risk Assessment/Calculations:    Physical Exam:   VS:  LMP 06/17/2007    Wt Readings from Last 3 Encounters:  01/02/23 277 lb (125.6 kg)  12/11/21 277 lb (125.6 kg)  10/17/21 225 lb (102.1 kg)    GEN: Well nourished, well developed in no acute distress NECK: No JVD; No carotid bruits CARDIAC: RRR, no murmurs, rubs, gallops RESPIRATORY:  CTA b/l without rales, wheezing or rhonchi  ABDOMEN: Soft, non-tender, non-distended EXTREMITIES:  No edema; No deformity   PPM/ICD/ILR site: is stable, no thinning, fluctuation, tethering  ASSESSMENT AND PLAN: .    ICD intact function no programming changes made  Pocket looks good EKG morphology and duration look good Suspect she may be a true non-responder  NICM Chronic CHF >99 % BP CoreVue suggests volume though no symptoms or  exam findings to support Get labs today  C/w Dr. Leeland Pugh > due to see them in July  4. [facility] (flutter) Follow  Dispo: remotes as usual, back in 64mo, monitor AMS burden, sooner if needed  Signed, Debbie Fails, PA-C

## 2023-10-21 ENCOUNTER — Ambulatory Visit: Attending: Physician Assistant | Admitting: Physician Assistant

## 2023-10-21 VITALS — BP 126/76 | HR 71 | Ht 66.0 in | Wt 277.0 lb

## 2023-10-21 DIAGNOSIS — I4892 Unspecified atrial flutter: Secondary | ICD-10-CM

## 2023-10-21 DIAGNOSIS — Z95810 Presence of automatic (implantable) cardiac defibrillator: Secondary | ICD-10-CM

## 2023-10-21 DIAGNOSIS — I5022 Chronic systolic (congestive) heart failure: Secondary | ICD-10-CM | POA: Diagnosis not present

## 2023-10-21 DIAGNOSIS — I5042 Chronic combined systolic (congestive) and diastolic (congestive) heart failure: Secondary | ICD-10-CM | POA: Diagnosis not present

## 2023-10-21 DIAGNOSIS — I428 Other cardiomyopathies: Secondary | ICD-10-CM

## 2023-10-21 LAB — CUP PACEART INCLINIC DEVICE CHECK
Battery Remaining Longevity: 50 mo
Brady Statistic RA Percent Paced: 0.02 %
Brady Statistic RV Percent Paced: 99.63 %
Date Time Interrogation Session: 20250507165655
HighPow Impedance: 69.75 Ohm
Implantable Lead Connection Status: 753985
Implantable Lead Connection Status: 753985
Implantable Lead Connection Status: 753985
Implantable Lead Implant Date: 20230403
Implantable Lead Implant Date: 20230403
Implantable Lead Implant Date: 20230403
Implantable Lead Location: 753858
Implantable Lead Location: 753859
Implantable Lead Location: 753860
Implantable Pulse Generator Implant Date: 20230403
Lead Channel Impedance Value: 400 Ohm
Lead Channel Impedance Value: 525 Ohm
Lead Channel Impedance Value: 862.5 Ohm
Lead Channel Pacing Threshold Amplitude: 0.75 V
Lead Channel Pacing Threshold Amplitude: 0.75 V
Lead Channel Pacing Threshold Amplitude: 0.75 V
Lead Channel Pacing Threshold Amplitude: 0.75 V
Lead Channel Pacing Threshold Amplitude: 1.25 V
Lead Channel Pacing Threshold Amplitude: 1.25 V
Lead Channel Pacing Threshold Pulse Width: 0.5 ms
Lead Channel Pacing Threshold Pulse Width: 0.5 ms
Lead Channel Pacing Threshold Pulse Width: 0.5 ms
Lead Channel Pacing Threshold Pulse Width: 0.5 ms
Lead Channel Pacing Threshold Pulse Width: 0.5 ms
Lead Channel Pacing Threshold Pulse Width: 0.5 ms
Lead Channel Sensing Intrinsic Amplitude: 4.5 mV
Lead Channel Sensing Intrinsic Amplitude: 6.1 mV
Lead Channel Setting Pacing Amplitude: 1.75 V
Lead Channel Setting Pacing Amplitude: 2 V
Lead Channel Setting Pacing Amplitude: 2.5 V
Lead Channel Setting Pacing Pulse Width: 0.5 ms
Lead Channel Setting Pacing Pulse Width: 0.5 ms
Lead Channel Setting Sensing Sensitivity: 0.5 mV
Pulse Gen Serial Number: 9986369
Zone Setting Status: 755011

## 2023-10-21 NOTE — Patient Instructions (Addendum)
 Medication Instructions:   Your physician recommends that you continue on your current medications as directed. Please refer to the Current Medication list given to you today.   *If you need a refill on your cardiac medications before your next appointment, please call your pharmacy*   Lab Work:  BMET MAG AND BNP TODAY  ON THE FIRST FLOOR    If you have labs (blood work) drawn today and your tests are completely normal, you will receive your results only by: MyChart Message (if you have MyChart) OR A paper copy in the mail If you have any lab test that is abnormal or we need to change your treatment, we will call you to review the results.   Testing/Procedures: NONE ORDERED  TODAY     Follow-Up: At [facility], you and your health needs are our priority.  As part of our continuing mission to provide you with exceptional heart care, our providers are all part of one team.  This team includes your primary Cardiologist (physician) and Advanced Practice Providers or APPs (Physician Assistants and Nurse Practitioners) who all work together to provide you with the care you need, when you need it.   Your next appointment:   WITH DR Audery Blazing / APP IN JULY  ( RECALL)  6 month(s)    Provider:   Mertha Abrahams, PA-C    We recommend signing up for the patient portal called "MyChart".  Sign up information is provided on this After Visit Summary.  MyChart is used to connect with patients for Virtual Visits (Telemedicine).  Patients are able to view lab/test results, encounter notes, upcoming appointments, etc.  Non-urgent messages can be sent to your provider as well.   To learn more about what you can do with MyChart, go to [email or web address].   Other Instructions

## 2023-10-22 LAB — BASIC METABOLIC PANEL WITH GFR
BUN/Creatinine Ratio: 15 (ref 12–28)
BUN: 13 mg/dL (ref 8–27)
CO2: 24 mmol/L (ref 20–29)
Calcium: 9.2 mg/dL (ref 8.7–10.3)
Chloride: 105 mmol/L (ref 96–106)
Creatinine, Ser: 0.89 mg/dL (ref 0.57–1.00)
Glucose: 87 mg/dL (ref 70–99)
Potassium: 4.8 mmol/L (ref 3.5–5.2)
Sodium: 140 mmol/L (ref 134–144)
eGFR: 72 mL/min/{1.73_m2} (ref >59)

## 2023-10-22 LAB — PRO B NATRIURETIC PEPTIDE: NT-Pro BNP: 282 pg/mL (ref 0–301)

## 2023-10-22 LAB — MAGNESIUM: Magnesium: 2.4 mg/dL — ABNORMAL HIGH (ref 1.6–2.3)

## 2023-10-23 ENCOUNTER — Encounter (HOSPITAL_COMMUNITY): Payer: Self-pay

## 2023-10-30 ENCOUNTER — Other Ambulatory Visit: Payer: Self-pay | Admitting: Family

## 2023-10-30 ENCOUNTER — Ambulatory Visit

## 2023-11-01 ENCOUNTER — Telehealth: Payer: Self-pay | Admitting: Family

## 2023-11-01 NOTE — Telephone Encounter (Signed)
Please contact pt to schedule follow up visit.

## 2023-11-02 NOTE — Telephone Encounter (Signed)
Lvm 2 schedule.

## 2023-11-05 NOTE — Progress Notes (Signed)
 Remote ICD transmission.

## 2023-11-06 ENCOUNTER — Other Ambulatory Visit: Payer: Self-pay | Admitting: Family

## 2023-11-11 ENCOUNTER — Other Ambulatory Visit: Payer: Self-pay | Admitting: Cardiology

## 2023-11-11 DIAGNOSIS — I5042 Chronic combined systolic (congestive) and diastolic (congestive) heart failure: Secondary | ICD-10-CM

## 2023-11-11 DIAGNOSIS — R601 Generalized edema: Secondary | ICD-10-CM

## 2023-11-13 ENCOUNTER — Ambulatory Visit: Admitting: Family

## 2023-11-20 ENCOUNTER — Encounter: Payer: Self-pay | Admitting: Cardiology

## 2023-12-04 ENCOUNTER — Encounter: Payer: Self-pay | Admitting: Family

## 2023-12-04 ENCOUNTER — Other Ambulatory Visit: Payer: Self-pay | Admitting: Family

## 2023-12-04 ENCOUNTER — Other Ambulatory Visit: Payer: Self-pay | Admitting: Gastroenterology

## 2023-12-22 ENCOUNTER — Ambulatory Visit: Payer: Medicare Other

## 2023-12-22 DIAGNOSIS — I428 Other cardiomyopathies: Secondary | ICD-10-CM

## 2023-12-22 NOTE — Progress Notes (Deleted)
 12/22/2023 Briana Miller [id number] Feb 17, 1956  Referring provider: Daryl Setter, NP Primary GI doctor: {[id number]} ( Dr. Teressa)  ASSESSMENT AND PLAN:  Gastritis with RUQ pain 05/2013 US  [facility] was normal 05/2013 HIDA scan with GB EF was normal 2021 CTAP WC diverticulosis, otherwise normal EGD 2022 normal, some food residue on previous EGD  Family history of colon cancer in mother Colonoscopy September 2020 found diverticulosis but was otherwise normal.  [facility] follows Dr. Pietro, last seen 10/2023 2021 cardiac calcium  score 0 Cardiac MRI 07/2021 31% S/p AICD 2023  10/10/2023 Echo LVEF improved to 35-40%, no AS  Atrial flutter  Patient Care Team: Daryl Setter, NP as PCP - General (Internal Medicine) Pietro Redell RAMAN, MD as PCP - Cardiology (Cardiology) Waddell Danelle ORN, MD as PCP - Electrophysiology (Clinical Cardiac Electrophysiology) Carla Milling, RPH-CPP (Pharmacist) [facility]  HISTORY OF PRESENT ILLNESS: 68 y.o. female with a past medical history listed below presents for evaluation of ***.   Last seen by Dr. Teressa 03/2021  *** Discussed the use of AI scribe software for clinical note transcription with the patient, who gave verbal consent to proceed.  History of Present Illness            She  reports that she has never smoked. She has never used smokeless tobacco. She reports that she does not currently use alcohol after a past usage of about 2.0 standard drinks of alcohol per week. She reports that she does not use drugs.  RELEVANT GI HISTORY, IMAGING AND LABS: Results         Review of pertinent gastrointestinal problems: 1. Family history of colon cancer; mother in her early 71s; colonoscopy Dr. Teressa 05/2013 found diverticulosis, no polyps, recommended recall at 5 years.  Colonoscopy September 2020 found diverticulosis but was otherwise normal. 2. RUQ pains: clinically seemed biliary-like; 05/2013  [facility] was normal; 05/2013 HIDA scan with GB EF was normal.05/2013 LFTs were normal.  Was recommended, scheduled for EGD but she cancelled it.  EGD 05/2014 Dr. Teressa, mild to moderate gastritis; path showed no H. Pylori.  She had been taking BC powders twice a day (around 2013-2015).  Repeat EGD June 2017, Dr. Teressa.  This was requested by her bariatric surgeon prior to surgery given her history of gastritis.  I noted a small about of food residue in the stomach, the examination was otherwise normal.   Repeat EGD Nov 2022 unremarkable, no specimens 3. Morbid obesity BMI 41 (10/2017 data) CBC    Component Value Date/Time   WBC 4.7 09/06/2021 1105   WBC 5.0 02/15/2021 1143   RBC 4.69 09/06/2021 1105   RBC 4.40 02/15/2021 1143   HGB 13.1 09/06/2021 1105   HCT 39.8 09/06/2021 1105   PLT 270 09/06/2021 1105   MCV 85 09/06/2021 1105   MCH 27.9 09/06/2021 1105   MCH 29.1 08/05/2016 0541   MCHC 32.9 09/06/2021 1105   MCHC 33.1 02/15/2021 1143   RDW 14.4 09/06/2021 1105   LYMPHSABS 1.5 09/06/2021 1105   MONOABS 0.7 02/15/2021 1143   EOSABS 0.2 09/06/2021 1105   BASOSABS 0.0 09/06/2021 1105   No results for input(s): HGB in the last 8760 hours.  CMP     Component Value Date/Time   NA 140 10/21/2023 1505   K 4.8 10/21/2023 1505   CL 105 10/21/2023 1505   CO2 24 10/21/2023 1505   GLUCOSE 87 10/21/2023 1505   GLUCOSE 105 (H) 09/26/2022 1159   BUN 13  10/21/2023 1505   CREATININE 0.89 10/21/2023 1505   CREATININE 0.95 12/02/2019 1434   CALCIUM  9.2 10/21/2023 1505   PROT 7.4 09/26/2022 1159   ALBUMIN 4.4 09/26/2022 1159   AST 14 09/26/2022 1159   ALT 11 09/26/2022 1159   ALKPHOS 93 09/26/2022 1159   BILITOT 0.5 09/26/2022 1159   GFRNONAA 45 (L) 07/27/2020 1446   GFRAA 52 (L) 07/27/2020 1446      Latest Ref Rng & Units 09/26/2022   11:59 AM 02/15/2021   11:43 AM 04/08/2019    2:06 PM  Hepatic Function  Total Protein 6.0 - 8.3 g/dL 7.4  7.4  7.2   Albumin 3.5 - 5.2 g/dL 4.4   4.1  4.2   AST 0 - 37 U/L 14  16  16    ALT 0 - 35 U/L 11  16  13    Alk Phosphatase 39 - 117 U/L 93  96  87   Total Bilirubin 0.2 - 1.2 mg/dL 0.5  0.6  0.6   Bilirubin, Direct 0.0 - 0.3 mg/dL   0.1       Current Medications:   Current Outpatient Medications (Endocrine & Metabolic):    FARXIGA  10 MG TABS tablet, TAKE 1 TABLET(10 MG) BY MOUTH DAILY   Semaglutide ,0.25 or 0.5MG /DOS, 2 MG/3ML SOPN, Inject 0.25 mg into the skin once a week.  Current Outpatient Medications (Cardiovascular):    carvedilol  (COREG ) 12.5 MG tablet, TAKE 1 TABLET BY MOUTH TWICE  DAILY   losartan  (COZAAR ) 25 MG tablet, TAKE 1 TABLET BY MOUTH DAILY   rosuvastatin  (CRESTOR ) 10 MG tablet, Take 1 tablet (10 mg total) by mouth daily. For cholesterol and heart.   spironolactone  (ALDACTONE ) 25 MG tablet, TAKE 1 TABLET BY MOUTH DAILY   torsemide  (DEMADEX ) 20 MG tablet, TAKE 2 TABLETS BY MOUTH  ALTERNATING WITH 1 TABLET BY  MOUTH EVERY OTHER DAY  Current Outpatient Medications (Respiratory):    albuterol  (VENTOLIN  HFA) 108 (90 Base) MCG/ACT inhaler, USE 2 INHALATIONS BY MOUTH EVERY 6 HOURS AS NEEDED   benzonatate  (TESSALON ) 100 MG capsule, Take 1 capsule (100 mg total) by mouth 3 (three) times daily as needed.   fluticasone  (FLONASE ) 50 MCG/ACT nasal spray, SHAKE LIQUID AND USE 2 SPRAYS IN EACH NOSTRIL DAILY   montelukast  (SINGULAIR ) 10 MG tablet, TAKE 1 TABLET BY MOUTH AT  BEDTIME  Current Outpatient Medications (Analgesics):    acetaminophen  (TYLENOL ) 500 MG tablet, Take 1,000 mg by mouth every 8 (eight) hours as needed for moderate pain or headache.   allopurinol  (ZYLOPRIM ) 100 MG tablet, TAKE 1 TABLET BY MOUTH DAILY   celecoxib  (CELEBREX ) 100 MG capsule, Take 100 mg by mouth daily as needed for pain.   Current Outpatient Medications (Other):    ACCU-CHEK GUIDE TEST test strip, TEST UP TO 4 TIMES DAILY   Accu-Chek Softclix Lancets lancets, TEST UP TO 4 TIMES DAILY   Blood Glucose Monitoring Suppl (ACCU-CHEK GUIDE)  w/Device KIT, USE AS DIRECTED   cholecalciferol (VITAMIN D3) 25 MCG (1000 UNIT) tablet, Take 1,000 Units by mouth daily.   famotidine  (PEPCID ) 40 MG tablet, TAKE 1 TABLET BY MOUTH TWICE  DAILY   gabapentin  (NEURONTIN ) 300 MG capsule, TAKE 3 CAPSULES BY MOUTH 3 TIMES DAILY   latanoprost (XALATAN) 0.005 % ophthalmic solution, Place 1 drop into both eyes at bedtime.    nortriptyline  (PAMELOR ) 10 MG capsule, TAKE 1 CAPSULE BY MOUTH AT  BEDTIME FOR A WEEK, THEN 2  CAPSULES BY MOUTH AT BEDTIME   nystatin  (  NYAMYC ) powder, APPLY TOPICALLY TO THE AFFECTED  AREA 3 TIMES DAILY  AS NEEDED FOR RASH   ondansetron  (ZOFRAN ) 4 MG tablet, TAKE 1 TABLET BY MOUTH EVERY 8  HOURS AS NEEDED FOR NAUSEA AND  VOMITING   pantoprazole  (PROTONIX ) 40 MG tablet, TAKE 1 TABLET BY MOUTH TWICE  DAILY   potassium chloride  SA (KLOR-CON  M) 20 MEQ tablet, Take 1 tablet (20 mEq total) by mouth daily.   scopolamine  (TRANSDERM-SCOP) 1 MG/3DAYS, Place 1 patch (1.5 mg total) onto the skin every 3 (three) days.   Tart Cherry 1200 MG CAPS, Take 1,200 mg by mouth 2 (two) times a week.   timolol (BETIMOL) 0.5 % ophthalmic solution, Place 1 drop into both eyes every morning.   Medical History:  Past Medical History:  Diagnosis Date   Acute systolic congestive heart failure (HCC) 03/01/2014   Allergy    Anemia    Arthritis    Cardiomyopathy (HCC)    Cervical myelopathy (HCC)    CHF (congestive heart failure) (HCC)    Chronic pain of left knee 09/14/2015   GERD (gastroesophageal reflux disease)    Gout    Heart disease    Hyperlipidemia    Hypertension    Hypertension, benign essential, goal below 140/90 11/03/2014   Impingement syndrome of left shoulder 09/07/2014   Knee joint pain 06/07/2013   Leg pain    Obesity, Class III, BMI 40-49.9 (morbid obesity) 01/26/2015   Overactive bladder    S/P arthroscopy of shoulder 03/28/2013   Shoulder joint pain 12/16/2012   Status post total right knee replacement using cement 12/11/2015    Allergies:  Allergies  Allergen Reactions   Entresto  [Sacubitril-Valsartan] Other (See Comments)    Dry mouth     Surgical History:  She  has a past surgical history that includes Knee arthroscopy; Total knee arthroplasty (09/22/2011); Rotator cuff repair (Right, 03/23/13); Colonoscopy; Anterior cervical decomp/discectomy fusion (N/A, 08/03/2016); Colonoscopy with propofol  (N/A, 02/24/2019); Esophagogastroduodenoscopy (egd) with propofol  (N/A, 04/25/2021); and BIV ICD INSERTION CRT-D (N/A, 09/16/2021). Family History:  Her family history includes Asthma in her father; Cancer in her mother; Colon cancer in her mother; Diabetes in her father; Glaucoma in her father; Heart attack (age of onset: 35) in her father; Hypertension in her father; Kidney failure in her father.  REVIEW OF SYSTEMS  : All other systems reviewed and negative except where noted in the History of Present Illness.  PHYSICAL EXAM: LMP 06/17/2007  Physical Exam          Alan JONELLE Coombs, PA-C 1:15 PM

## 2023-12-24 ENCOUNTER — Ambulatory Visit: Admitting: Physician Assistant

## 2023-12-24 ENCOUNTER — Ambulatory Visit: Payer: Self-pay | Admitting: Internal Medicine

## 2023-12-24 LAB — CUP PACEART REMOTE DEVICE CHECK
Battery Remaining Longevity: 50 mo
Battery Remaining Percentage: 59 %
Battery Voltage: 2.96 V
Brady Statistic AP VP Percent: 1 %
Brady Statistic AP VS Percent: 1 %
Brady Statistic AS VP Percent: 99 %
Brady Statistic AS VS Percent: 1 %
Brady Statistic RA Percent Paced: 1 %
Date Time Interrogation Session: 20250708032827
HighPow Impedance: 80 Ohm
HighPow Impedance: 80 Ohm
Implantable Lead Connection Status: 753985
Implantable Lead Connection Status: 753985
Implantable Lead Connection Status: 753985
Implantable Lead Implant Date: 20230403
Implantable Lead Implant Date: 20230403
Implantable Lead Implant Date: 20230403
Implantable Lead Location: 753858
Implantable Lead Location: 753859
Implantable Lead Location: 753860
Implantable Pulse Generator Implant Date: 20230403
Lead Channel Impedance Value: 400 Ohm
Lead Channel Impedance Value: 550 Ohm
Lead Channel Impedance Value: 940 Ohm
Lead Channel Pacing Threshold Amplitude: 0.75 V
Lead Channel Pacing Threshold Amplitude: 0.75 V
Lead Channel Pacing Threshold Amplitude: 1.25 V
Lead Channel Pacing Threshold Pulse Width: 0.5 ms
Lead Channel Pacing Threshold Pulse Width: 0.5 ms
Lead Channel Pacing Threshold Pulse Width: 0.5 ms
Lead Channel Sensing Intrinsic Amplitude: 11.7 mV
Lead Channel Sensing Intrinsic Amplitude: 4.6 mV
Lead Channel Setting Pacing Amplitude: 1.75 V
Lead Channel Setting Pacing Amplitude: 2 V
Lead Channel Setting Pacing Amplitude: 2.5 V
Lead Channel Setting Pacing Pulse Width: 0.5 ms
Lead Channel Setting Pacing Pulse Width: 0.5 ms
Lead Channel Setting Sensing Sensitivity: 0.5 mV
Pulse Gen Serial Number: 9986369
Zone Setting Status: 755011

## 2024-01-10 ENCOUNTER — Other Ambulatory Visit: Payer: Self-pay | Admitting: Gastroenterology

## 2024-01-24 ENCOUNTER — Other Ambulatory Visit: Payer: Self-pay | Admitting: Cardiology

## 2024-01-24 ENCOUNTER — Other Ambulatory Visit: Payer: Self-pay | Admitting: Gastroenterology

## 2024-01-24 DIAGNOSIS — I428 Other cardiomyopathies: Secondary | ICD-10-CM

## 2024-01-26 ENCOUNTER — Other Ambulatory Visit: Payer: Self-pay | Admitting: Internal Medicine

## 2024-02-19 ENCOUNTER — Ambulatory Visit: Admitting: Family

## 2024-02-23 ENCOUNTER — Other Ambulatory Visit: Payer: Self-pay | Admitting: Gastroenterology

## 2024-02-23 ENCOUNTER — Other Ambulatory Visit: Payer: Self-pay | Admitting: *Deleted

## 2024-02-23 MED ORDER — ONDANSETRON HCL 4 MG PO TABS: 4.0000 mg | ORAL_TABLET | Freq: Three times a day (TID) | ORAL | 0 refills | Status: DC | PRN

## 2024-02-25 ENCOUNTER — Other Ambulatory Visit: Payer: Self-pay | Admitting: Family

## 2024-02-26 ENCOUNTER — Ambulatory Visit: Admitting: Family

## 2024-03-07 NOTE — Progress Notes (Deleted)
 HPI: FU cardiomyopathy. Echocardiogram in October of 2014 showed an ejection fraction of 40-45%, mild left ventricular hypertrophy and mild left atrial enlargement. Cardiac CTA August 2021 showed calcium  score of 0 and no coronary disease. Entresto  was discontinued previously as patient thought it was causing dry mouth.  Cardiac MRI February 2023 showed ejection fraction 31% with septal dyskinesis consistent with left bundle branch block but no infiltrative process.  Patient had biventricular ICD placed April 2023.  Echocardiogram April 2025 showed ejection fraction 35 to 40%, mild left ventricular enlargement, grade 1 diastolic dysfunction, mild to moderate left atrial enlargement, moderate to severe mitral regurgitation, moderate tricuspid regurgitation.  Since last seen    Current Outpatient Medications  Medication Sig Dispense Refill   ACCU-CHEK GUIDE TEST test strip TEST UP TO 4 TIMES DAILY 400 strip 2   Accu-Chek Softclix Lancets lancets TEST UP TO 4 TIMES DAILY 400 each 2   acetaminophen  (TYLENOL ) 500 MG tablet Take 1,000 mg by mouth every 8 (eight) hours as needed for moderate pain or headache.     albuterol  (VENTOLIN  HFA) 108 (90 Base) MCG/ACT inhaler USE 2 INHALATIONS BY MOUTH EVERY 6 HOURS AS NEEDED 34 g 2   allopurinol  (ZYLOPRIM ) 100 MG tablet TAKE 1 TABLET BY MOUTH DAILY 100 tablet 2   benzonatate  (TESSALON ) 100 MG capsule Take 1 capsule (100 mg total) by mouth 3 (three) times daily as needed. 20 capsule 0   Blood Glucose Monitoring Suppl (ACCU-CHEK GUIDE) w/Device KIT USE AS DIRECTED 1 kit 0   carvedilol  (COREG ) 12.5 MG tablet TAKE 1 TABLET BY MOUTH TWICE  DAILY 180 tablet 3   celecoxib  (CELEBREX ) 100 MG capsule Take 100 mg by mouth daily as needed for pain.     cholecalciferol (VITAMIN D3) 25 MCG (1000 UNIT) tablet Take 1,000 Units by mouth daily.     dapagliflozin  propanediol (FARXIGA ) 10 MG TABS tablet Take 1 tablet (10 mg total) by mouth daily. 90 tablet 0   famotidine   (PEPCID ) 40 MG tablet TAKE 1 TABLET BY MOUTH TWICE  DAILY 200 tablet 1   fluticasone  (FLONASE ) 50 MCG/ACT nasal spray SHAKE LIQUID AND USE 2 SPRAYS IN EACH NOSTRIL DAILY 16 g 1   gabapentin  (NEURONTIN ) 300 MG capsule TAKE 3 CAPSULES BY MOUTH 3 TIMES DAILY 900 capsule 2   latanoprost (XALATAN) 0.005 % ophthalmic solution Place 1 drop into both eyes at bedtime.   11   losartan  (COZAAR ) 25 MG tablet TAKE 1 TABLET BY MOUTH DAILY 100 tablet 2   montelukast  (SINGULAIR ) 10 MG tablet TAKE 1 TABLET BY MOUTH AT  BEDTIME 90 tablet 1   nortriptyline  (PAMELOR ) 10 MG capsule TAKE 1 CAPSULE BY MOUTH AT  BEDTIME FOR A WEEK, THEN 2  CAPSULES BY MOUTH AT BEDTIME 180 capsule 3   nystatin  (NYAMYC ) powder APPLY TOPICALLY TO THE AFFECTED  AREA 3 TIMES DAILY  AS NEEDED FOR RASH 300 g 1   ondansetron  (ZOFRAN ) 4 MG tablet Take 1 tablet (4 mg total) by mouth every 8 (eight) hours as needed for nausea or vomiting. 270 tablet 0   pantoprazole  (PROTONIX ) 40 MG tablet TAKE 1 TABLET BY MOUTH TWICE  DAILY 100 tablet 0   potassium chloride  SA (KLOR-CON  M) 20 MEQ tablet Take 1 tablet (20 mEq total) by mouth daily. 90 tablet 0   rosuvastatin  (CRESTOR ) 10 MG tablet Take 1 tablet (10 mg total) by mouth daily. For cholesterol and heart. 90 tablet 3   scopolamine  (TRANSDERM-SCOP) 1 MG/3DAYS Place  1 patch (1.5 mg total) onto the skin every 3 (three) days. 3 patch 0   Semaglutide ,0.25 or 0.5MG /DOS, 2 MG/3ML SOPN Inject 0.25 mg into the skin once a week. 3 mL 0   spironolactone  (ALDACTONE ) 25 MG tablet TAKE 1 TABLET BY MOUTH DAILY 90 tablet 3   Tart Cherry 1200 MG CAPS Take 1,200 mg by mouth 2 (two) times a week.     timolol (BETIMOL) 0.5 % ophthalmic solution Place 1 drop into both eyes every morning.      torsemide  (DEMADEX ) 20 MG tablet TAKE 2 TABLETS BY MOUTH  ALTERNATING WITH 1 TABLET BY  MOUTH EVERY OTHER DAY 135 tablet 0   No current facility-administered medications for this visit.     Past Medical History:  Diagnosis Date    Acute systolic congestive heart failure (HCC) 03/01/2014   Allergy    Anemia    Arthritis    Cardiomyopathy (HCC)    Cervical myelopathy (HCC)    CHF (congestive heart failure) (HCC)    Chronic pain of left knee 09/14/2015   GERD (gastroesophageal reflux disease)    Gout    Heart disease    Hyperlipidemia    Hypertension    Hypertension, benign essential, goal below 140/90 11/03/2014   Impingement syndrome of left shoulder 09/07/2014   Knee joint pain 06/07/2013   Leg pain    Obesity, Class III, BMI 40-49.9 (morbid obesity) 01/26/2015   Overactive bladder    S/P arthroscopy of shoulder 03/28/2013   Shoulder joint pain 12/16/2012   Status post total right knee replacement using cement 12/11/2015    Past Surgical History:  Procedure Laterality Date   ANTERIOR CERVICAL DECOMP/DISCECTOMY FUSION N/A 08/03/2016   Procedure: ANTERIOR CERVICAL DECOMPRESSION/DISCECTOMY FUSION C4-5, C5-6;  Surgeon: Victory Gens, MD;  Location: [facility] OR;  Service: Neurosurgery;  Laterality: N/A;   BIV ICD INSERTION CRT-D N/A 09/16/2021   Procedure: BIV ICD INSERTION CRT-D;  Surgeon: Waddell Danelle ORN, MD;  Location: [facility] INVASIVE CV LAB;  Service: Cardiovascular;  Laterality: N/A;   COLONOSCOPY     COLONOSCOPY WITH PROPOFOL  N/A 02/24/2019   Procedure: COLONOSCOPY WITH PROPOFOL ;  Surgeon: Teressa Toribio SQUIBB, MD;  Location: [facility] ENDOSCOPY;  Service: Endoscopy;  Laterality: N/A;   ESOPHAGOGASTRODUODENOSCOPY (EGD) WITH PROPOFOL  N/A 04/25/2021   Procedure: ESOPHAGOGASTRODUODENOSCOPY (EGD) WITH PROPOFOL ;  Surgeon: Teressa Toribio SQUIBB, MD;  Location: [facility] ENDOSCOPY;  Service: Endoscopy;  Laterality: N/A;   KNEE ARTHROSCOPY     2007   ROTATOR CUFF REPAIR Right 03/23/13   TOTAL KNEE ARTHROPLASTY  09/22/2011   Procedure: TOTAL KNEE ARTHROPLASTY;  Surgeon: Garnette JONETTA Raman, MD;  Location: [facility] OR;  Service: Orthopedics;  Laterality: Right;    Social History   Socioeconomic History   Marital status: Married    Spouse name: Not on file    Number of children: 2   Years of education: HS   Highest education level: Some college, no degree  Occupational History   Occupation: Acupuncturist: [employer]  Tobacco Use   Smoking status: Never   Smokeless tobacco: Never  Vaping Use   Vaping status: Never Used  Substance and Sexual Activity   Alcohol use: Not Currently    Alcohol/week: 2.0 standard drinks of alcohol    Types: 2 Glasses of wine per week   Drug use: No   Sexual activity: Not on file  Other Topics Concern   Not on file  Social History Narrative   Married- 32 years  2 children- grown daughter- lives in [place]- has 2 children   Grown son lives in [place]- 4 children   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   1-2 cups caffeine daily.   Lives at home with her husband.   Social Drivers of Corporate investment banker Strain: Low Risk  (07/22/2023)   Overall Financial Resource Strain (CARDIA)    Difficulty of Paying Living Expenses: Not hard at all  Food Insecurity: No Food Insecurity (07/22/2023)   Hunger Vital Sign    Worried About Running Out of Food in the Last Year: Never true    Ran Out of Food in the Last Year: Never true  Transportation Needs: No Transportation Needs (07/22/2023)   PRAPARE - Administrator, Civil Service (Medical): No    Lack of Transportation (Non-Medical): No  Physical Activity: Unknown (07/22/2023)   Exercise Vital Sign    Days of Exercise per Week: 0 days    Minutes of Exercise per Session: Not on file  Stress: No Stress Concern Present (07/22/2023)   [employer] of Occupational Health - Occupational Stress Questionnaire    Feeling of Stress : Only a little  Social Connections: Moderately Isolated (07/22/2023)   Social Connection and Isolation Panel    Frequency of Communication with Friends and Family: Once a week    Frequency of Social Gatherings with  Friends and Family: Once a week    Attends Religious Services: 1 to 4 times per year    Active Member of [employer] or Organizations: No    Attends Engineer, structural: Not on file    Marital Status: Married  Catering manager Violence: Not on file    Family History  Problem Relation Age of Onset   Cancer Mother        colon 39 and breast 55- deceased   Colon cancer Mother    Diabetes Father        living   Asthma Father    Hypertension Father    Heart attack Father 1       medical management per pt   Glaucoma Father        had eye transplant   Kidney failure Father        acute renal failure- died 12   Esophageal cancer Neg Hx    Stomach cancer Neg Hx    Rectal cancer Neg Hx     ROS: no fevers or chills, productive cough, hemoptysis, dysphasia, odynophagia, melena, hematochezia, dysuria, hematuria, rash, seizure activity, orthopnea, PND, pedal edema, claudication. Remaining systems are negative.  Physical Exam: Well-developed well-nourished in no acute distress.  Skin is warm and dry.  HEENT is normal.  Neck is supple.  Chest is clear to auscultation with normal expansion.  Cardiovascular exam is regular rate and rhythm.  Abdominal exam nontender or distended. No masses palpated. Extremities show no edema. neuro grossly intact  ECG- personally reviewed  A/P  1 nonischemic cardiomyopathy-we will continue losartan , carvedilol , Farxiga  and spironolactone .  She did not tolerate Entresto .  2 CRT-D-per electrophysiology.  3 chronic combined systolic/diastolic congestive heart failure-she appears to be euvolemic on examination today.  Continue Demadex , spironolactone  and Farxiga .  4 hypertension-patient's blood pressure is controlled.  Continue present medications.  5 obstructive sleep apnea-managed by pulmonary.  6 mitral regurgitation-moderate to severe on most recent echocardiogram.  7 morbid obesity-we discussed  the importance of weight loss.  Redell Shallow, MD

## 2024-03-11 ENCOUNTER — Ambulatory Visit: Admitting: Family

## 2024-03-11 VITALS — BP 121/76 | HR 91 | Temp 98.0°F | Resp 16

## 2024-03-11 DIAGNOSIS — R102 Pelvic and perineal pain: Secondary | ICD-10-CM | POA: Diagnosis not present

## 2024-03-11 DIAGNOSIS — Z23 Encounter for immunization: Secondary | ICD-10-CM

## 2024-03-11 DIAGNOSIS — K047 Periapical abscess without sinus: Secondary | ICD-10-CM | POA: Diagnosis not present

## 2024-03-11 DIAGNOSIS — K219 Gastro-esophageal reflux disease without esophagitis: Secondary | ICD-10-CM

## 2024-03-11 DIAGNOSIS — M109 Gout, unspecified: Secondary | ICD-10-CM | POA: Diagnosis not present

## 2024-03-11 DIAGNOSIS — I10 Essential (primary) hypertension: Secondary | ICD-10-CM | POA: Diagnosis not present

## 2024-03-11 DIAGNOSIS — E785 Hyperlipidemia, unspecified: Secondary | ICD-10-CM

## 2024-03-11 DIAGNOSIS — E119 Type 2 diabetes mellitus without complications: Secondary | ICD-10-CM

## 2024-03-11 DIAGNOSIS — M5412 Radiculopathy, cervical region: Secondary | ICD-10-CM

## 2024-03-11 DIAGNOSIS — Z780 Asymptomatic menopausal state: Secondary | ICD-10-CM

## 2024-03-11 DIAGNOSIS — G959 Disease of spinal cord, unspecified: Secondary | ICD-10-CM

## 2024-03-11 DIAGNOSIS — I428 Other cardiomyopathies: Secondary | ICD-10-CM

## 2024-03-11 DIAGNOSIS — G4733 Obstructive sleep apnea (adult) (pediatric): Secondary | ICD-10-CM

## 2024-03-11 DIAGNOSIS — N3281 Overactive bladder: Secondary | ICD-10-CM

## 2024-03-11 DIAGNOSIS — G43809 Other migraine, not intractable, without status migrainosus: Secondary | ICD-10-CM

## 2024-03-11 DIAGNOSIS — Z1231 Encounter for screening mammogram for malignant neoplasm of breast: Secondary | ICD-10-CM

## 2024-03-11 DIAGNOSIS — E1159 Type 2 diabetes mellitus with other circulatory complications: Secondary | ICD-10-CM

## 2024-03-11 MED ORDER — AMOXICILLIN-POT CLAVULANATE 875-125 MG PO TABS: 1.0000 | ORAL_TABLET | Freq: Two times a day (BID) | ORAL | 0 refills | Status: DC

## 2024-03-11 MED ORDER — OXYBUTYNIN CHLORIDE ER 5 MG PO TB24: 5.0000 mg | ORAL_TABLET | Freq: Every day | ORAL | 1 refills | Status: DC

## 2024-03-11 MED ORDER — PANTOPRAZOLE SODIUM 40 MG PO TBEC
40.0000 mg | DELAYED_RELEASE_TABLET | Freq: Every day | ORAL | 1 refills | Status: AC
Start: 2024-03-11 — End: ?

## 2024-03-11 MED ORDER — FAMOTIDINE 40 MG PO TABS
40.0000 mg | ORAL_TABLET | Freq: Every day | ORAL | 1 refills | Status: AC
Start: 2024-03-11 — End: ?

## 2024-03-11 MED ORDER — RYBELSUS 3 MG PO TABS: 3.0000 mg | ORAL_TABLET | Freq: Every day | ORAL | 0 refills | Status: DC

## 2024-03-11 NOTE — Assessment & Plan Note (Signed)
 Trial of ditropan , myrbetriq  and gemtesa are not on her formulary.

## 2024-03-11 NOTE — Assessment & Plan Note (Addendum)
 Lab Results  Component Value Date   HGBA1C 6.8 (H) 09/26/2022   HGBA1C 6.4 12/11/2021   HGBA1C 6.7 (H) 08/02/2021   Lab Results  Component Value Date   LDLCALC 114 (H) 09/26/2022   CREATININE 0.89 10/21/2023   States rybelsus  is covered by insurance and she would like to try for weight loss. Continue farxiga .

## 2024-03-11 NOTE — Assessment & Plan Note (Signed)
 Lab Results  Component Value Date   CHOL 199 09/26/2022   HDL 62.30 09/26/2022   LDLCALC 114 (H) 09/26/2022   TRIG 112.0 09/26/2022   CHOLHDL 3 09/26/2022   Continues crestor , update lipid panel.

## 2024-03-11 NOTE — Assessment & Plan Note (Signed)
 Had some symptoms 2 weeks ago- admits to not taking allopurinol .  Recommended that she tries to take allopurinol  every day.

## 2024-03-11 NOTE — Assessment & Plan Note (Signed)
 Not using CPAP, wishes to hold off on sleep apnea evaluation until her husband retires.

## 2024-03-11 NOTE — Patient Instructions (Signed)
 VISIT SUMMARY:  Today, we addressed your leg cramps, diabetes management, GERD, urge incontinence, abdominal pain, gout, migraines, and general health maintenance. We made some adjustments to your medications and planned necessary tests and screenings.  YOUR PLAN:  TYPE 2 DIABETES MELLITUS: You are switching from Ozempic  to Rybelsus  due to insurance coverage. -Start Rybelsus  3 mg daily for one month, then adjust as needed. -We will send the prescription to [facility]. -Check your A1c level today. -Rybelsus  may need prior authorization and might not help with GERD symptoms.  GASTROESOPHAGEAL REFLUX DISEASE WITH CHRONIC NAUSEA: You have chronic GERD with nausea, managed with pantoprazole , famotidine , and ondansetron . -Continue taking pantoprazole  and famotidine . -Continue taking ondansetron  for nausea. -Be aware that Rybelsus  might worsen GERD symptoms.  LEG CRAMPS AND EDEMA: Your leg cramps are linked to torsemide  use and have improved with magnesium. Edema has increased with reduced torsemide . -Resume taking torsemide  daily. -Continue taking a daily magnesium supplement.  URGE INCONTINENCE: You have urge incontinence with no abnormalities found in previous evaluations. -Start taking Ditropan  once daily. -Be aware that Ditropan  may cause dry mouth.  LEFT LOWER ABDOMINAL PAIN, POSSIBLE OVARIAN ORIGIN: You have intermittent pain that may be related to ovarian cysts. -We will order a pelvic ultrasound, including a transvaginal ultrasound.  GOUT: You had a recent gout flare-up after eating purine-rich foods and have not been taking allopurinol  regularly. -Take allopurinol  daily as prescribed. -Avoid purine-rich foods like beef and steak.  MIGRAINE: You experience migraines 2-3 times per week, possibly linked to untreated sleep apnea. -Consider re-evaluating your sleep apnea.  HYPERTENSION: Your blood pressure is well-controlled. -Continue taking carvedilol  and losartan .  HYPERLIPIDEMIA:  You are overdue for a cholesterol check. -We will check your cholesterol levels today.  GENERAL HEALTH MAINTENANCE: You are due for several screenings and vaccinations. -Administer the pneumonia vaccine today. -Schedule a flu shot. -Order a mammogram and bone density scan. -Check your urine for protein. -We discussed the safety of the flu shot and its potential link to Bell's palsy.

## 2024-03-11 NOTE — Assessment & Plan Note (Signed)
 Increase torsemide  back to once daily- continue kdur and otc magnesium supplements to help with leg cramping.

## 2024-03-11 NOTE — Assessment & Plan Note (Signed)
 Has cracked tooth- plans to get in with dentist for possible extraction. She is requesting rx to have on handy for cruise in case infection flares up.

## 2024-03-11 NOTE — Progress Notes (Signed)
 Subjective:     Patient ID: Briana Miller, female    DOB: July 14, 1955, 68 y.o.   MRN: [id number]  Chief Complaint  Patient presents with   Diabetes    Here for follow up   Leg Pain    Patient complains of bilateral leg cramps, more on the right    HPI  Discussed the use of AI scribe software for clinical note transcription with the patient, who gave verbal consent to proceed.  History of Present Illness  Briana Miller is a 68 year old female who presents for a follow-up visit.  She experiences leg cramps two to three times a night for the past month. Potassium pills and adjusting torsemide  intake to every other day have reduced the severity, and daily magnesium has significantly helped. She is interested in starting Rybelsus , as it is covered by her insurance, and has not taken Ozempic  for about five months. She has not checked her blood sugar at home recently and is due for an A1c update.  She requests refills for her nasal spray, antibiotics for a dental procedure, and Protonix  for reflux, which she takes once daily with famotidine . She experiences nausea at night and takes ondansetron  nightly.  She reports urinary incontinence with a strong urge to urinate and occasional left lower abdominal pain. She experiences migraines two to three times a week and has not been using her CPAP for sleep apnea. She had a recent gout flare-up after consuming beef and steak, managed with medication and cherry vitamins. She is due for a cholesterol check, bone density scan, and mammogram, and needs to reschedule missed eye appointments.     Health Maintenance Due  Topic Date Due   Medicare Annual Wellness (AWV)  Never done   Diabetic kidney evaluation - Urine ACR  Never done   Zoster Vaccines- Shingrix (1 of 2) Never done   OPHTHALMOLOGY EXAM  12/12/2022   DEXA SCAN  Never done   Mammogram  09/07/2023   COVID-19 Vaccine (5 - 2025-26 season) 02/15/2024   Colonoscopy   02/24/2024    Past Medical History:  Diagnosis Date   Acute systolic congestive heart failure (HCC) 03/01/2014   Allergy    Anemia    Arthritis    Cardiomyopathy (HCC)    Cervical myelopathy (HCC)    CHF (congestive heart failure) (HCC)    Chronic pain of left knee 09/14/2015   GERD (gastroesophageal reflux disease)    Gout    Heart disease    Hyperlipidemia    Hypertension    Hypertension, benign essential, goal below 140/90 11/03/2014   Impingement syndrome of left shoulder 09/07/2014   Knee joint pain 06/07/2013   Leg pain    Obesity, Class III, BMI 40-49.9 (morbid obesity) 01/26/2015   Overactive bladder    S/P arthroscopy of shoulder 03/28/2013   Shoulder joint pain 12/16/2012   Status post total right knee replacement using cement 12/11/2015    Past Surgical History:  Procedure Laterality Date   ANTERIOR CERVICAL DECOMP/DISCECTOMY FUSION N/A 08/03/2016   Procedure: ANTERIOR CERVICAL DECOMPRESSION/DISCECTOMY FUSION C4-5, C5-6;  Surgeon: Victory Gens, MD;  Location: [facility] OR;  Service: Neurosurgery;  Laterality: N/A;   BIV ICD INSERTION CRT-D N/A 09/16/2021   Procedure: BIV ICD INSERTION CRT-D;  Surgeon: Waddell Danelle ORN, MD;  Location: [facility] INVASIVE CV LAB;  Service: Cardiovascular;  Laterality: N/A;   COLONOSCOPY     COLONOSCOPY WITH PROPOFOL  N/A 02/24/2019   Procedure: COLONOSCOPY WITH PROPOFOL ;  Surgeon:  Teressa Toribio SQUIBB, MD;  Location: [facility];  Service: Endoscopy;  Laterality: N/A;   ESOPHAGOGASTRODUODENOSCOPY (EGD) WITH PROPOFOL  N/A 04/25/2021   Procedure: ESOPHAGOGASTRODUODENOSCOPY (EGD) WITH PROPOFOL ;  Surgeon: Teressa Toribio SQUIBB, MD;  Location: [facility];  Service: Endoscopy;  Laterality: N/A;   KNEE ARTHROSCOPY     2007   ROTATOR CUFF REPAIR Right 03/23/13   TOTAL KNEE ARTHROPLASTY  09/22/2011   Procedure: TOTAL KNEE ARTHROPLASTY;  Surgeon: Garnette JONETTA Raman, MD;  Location: [facility] OR;  Service: Orthopedics;  Laterality: Right;    Family History  Problem Relation Age  of Onset   Cancer Mother        colon 44 and breast 53- deceased   Colon cancer Mother    Diabetes Father        living   Asthma Father    Hypertension Father    Heart attack Father 44       medical management per pt   Glaucoma Father        had eye transplant   Kidney failure Father        acute renal failure- died 59   Esophageal cancer Neg Hx    Stomach cancer Neg Hx    Rectal cancer Neg Hx     Social History   Socioeconomic History   Marital status: Married    Spouse name: Not on file   Number of children: 2   Years of education: HS   Highest education level: Some college, no degree  Occupational History   Occupation: Acupuncturist: [employer]  Tobacco Use   Smoking status: Never   Smokeless tobacco: Never  Vaping Use   Vaping status: Never Used  Substance and Sexual Activity   Alcohol use: Not Currently    Alcohol/week: 2.0 standard drinks of alcohol    Types: 2 Glasses of wine per week   Drug use: No   Sexual activity: Not on file  Other Topics Concern   Not on file  Social History Narrative   Married- 32 years   2 children- grown daughter- lives in [place]- has 2 children   Grown son lives in [place]- 4 children   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   1-2 cups caffeine daily.   Lives at home with her husband.   Social Drivers of Corporate investment banker Strain: Low Risk  (07/22/2023)   Overall Financial Resource Strain (CARDIA)    Difficulty of Paying Living Expenses: Not hard at all  Food Insecurity: No Food Insecurity (07/22/2023)   Hunger Vital Sign    Worried About Running Out of Food in the Last Year: Never true    Ran Out of Food in the Last Year: Never true  Transportation Needs: No Transportation Needs (07/22/2023)   PRAPARE - Administrator, Civil Service (Medical): No    Lack of Transportation (Non-Medical): No   Physical Activity: Unknown (07/22/2023)   Exercise Vital Sign    Days of Exercise per Week: 0 days    Minutes of Exercise per Session: Not on file  Stress: No Stress Concern Present (07/22/2023)   [employer] of Occupational Health - Occupational Stress Questionnaire    Feeling of Stress : Only a little  Social Connections: Moderately Isolated (07/22/2023)   Social Connection and Isolation Panel    Frequency of Communication with Friends  and Family: Once a week    Frequency of Social Gatherings with Friends and Family: Once a week    Attends Religious Services: 1 to 4 times per year    Active Member of [employer] or Organizations: No    Attends Engineer, structural: Not on file    Marital Status: Married  Catering manager Violence: Not on file    Outpatient Medications Prior to Visit  Medication Sig Dispense Refill   ACCU-CHEK GUIDE TEST test strip TEST UP TO 4 TIMES DAILY 400 strip 2   Accu-Chek Softclix Lancets lancets TEST UP TO 4 TIMES DAILY 400 each 2   acetaminophen  (TYLENOL ) 500 MG tablet Take 1,000 mg by mouth every 8 (eight) hours as needed for moderate pain or headache.     albuterol  (VENTOLIN  HFA) 108 (90 Base) MCG/ACT inhaler USE 2 INHALATIONS BY MOUTH EVERY 6 HOURS AS NEEDED 34 g 2   allopurinol  (ZYLOPRIM ) 100 MG tablet TAKE 1 TABLET BY MOUTH DAILY 100 tablet 2   benzonatate  (TESSALON ) 100 MG capsule Take 1 capsule (100 mg total) by mouth 3 (three) times daily as needed. 20 capsule 0   Blood Glucose Monitoring Suppl (ACCU-CHEK GUIDE) w/Device KIT USE AS DIRECTED 1 kit 0   carvedilol  (COREG ) 12.5 MG tablet TAKE 1 TABLET BY MOUTH TWICE  DAILY 180 tablet 3   cholecalciferol (VITAMIN D3) 25 MCG (1000 UNIT) tablet Take 1,000 Units by mouth daily.     dapagliflozin  propanediol (FARXIGA ) 10 MG TABS tablet Take 1 tablet (10 mg total) by mouth daily. 90 tablet 0   fluticasone  (FLONASE ) 50 MCG/ACT nasal spray SHAKE LIQUID AND USE 2 SPRAYS IN EACH NOSTRIL DAILY 16 g 1    gabapentin  (NEURONTIN ) 300 MG capsule TAKE 3 CAPSULES BY MOUTH 3 TIMES DAILY 900 capsule 2   latanoprost (XALATAN) 0.005 % ophthalmic solution Place 1 drop into both eyes at bedtime.   11   losartan  (COZAAR ) 25 MG tablet TAKE 1 TABLET BY MOUTH DAILY 100 tablet 2   montelukast  (SINGULAIR ) 10 MG tablet TAKE 1 TABLET BY MOUTH AT  BEDTIME 90 tablet 1   nortriptyline  (PAMELOR ) 10 MG capsule TAKE 1 CAPSULE BY MOUTH AT  BEDTIME FOR A WEEK, THEN 2  CAPSULES BY MOUTH AT BEDTIME 180 capsule 3   nystatin  (NYAMYC ) powder APPLY TOPICALLY TO THE AFFECTED  AREA 3 TIMES DAILY  AS NEEDED FOR RASH 300 g 1   ondansetron  (ZOFRAN ) 4 MG tablet Take 1 tablet (4 mg total) by mouth every 8 (eight) hours as needed for nausea or vomiting. 270 tablet 0   potassium chloride  SA (KLOR-CON  M) 20 MEQ tablet Take 1 tablet (20 mEq total) by mouth daily. 90 tablet 0   rosuvastatin  (CRESTOR ) 10 MG tablet Take 1 tablet (10 mg total) by mouth daily. For cholesterol and heart. 90 tablet 3   spironolactone  (ALDACTONE ) 25 MG tablet TAKE 1 TABLET BY MOUTH DAILY 90 tablet 3   Tart Cherry 1200 MG CAPS Take 1,200 mg by mouth 2 (two) times a week.     timolol (BETIMOL) 0.5 % ophthalmic solution Place 1 drop into both eyes every morning.      torsemide  (DEMADEX ) 20 MG tablet TAKE 2 TABLETS BY MOUTH  ALTERNATING WITH 1 TABLET BY  MOUTH EVERY OTHER DAY 135 tablet 0   famotidine  (PEPCID ) 40 MG tablet TAKE 1 TABLET BY MOUTH TWICE  DAILY 200 tablet 1   pantoprazole  (PROTONIX ) 40 MG tablet TAKE 1 TABLET BY MOUTH TWICE  DAILY 100 tablet 0   celecoxib  (CELEBREX ) 100 MG capsule Take 100 mg by mouth daily as needed for pain.     scopolamine  (TRANSDERM-SCOP) 1 MG/3DAYS Place 1 patch (1.5 mg total) onto the skin every 3 (three) days. 3 patch 0   Semaglutide ,0.25 or 0.5MG /DOS, 2 MG/3ML SOPN Inject 0.25 mg into the skin once a week. 3 mL 0   No facility-administered medications prior to visit.    Allergies  Allergen Reactions   Entresto   [Sacubitril-Valsartan] Other (See Comments)    Dry mouth    ROS See HPI    Objective:    Physical Exam Constitutional:      General: She is not in acute distress.    Appearance: Normal appearance. She is well-developed.  HENT:     Head: Normocephalic and atraumatic.     Right Ear: External ear normal.     Left Ear: External ear normal.  Eyes:     General: No scleral icterus. Neck:     Thyroid : No thyromegaly.  Cardiovascular:     Rate and Rhythm: Normal rate and regular rhythm.     Heart sounds: Normal heart sounds. No murmur heard. Pulmonary:     Effort: Pulmonary effort is normal. No respiratory distress.     Breath sounds: Normal breath sounds. No wheezing.  Musculoskeletal:     Cervical back: Neck supple.  Skin:    General: Skin is warm and dry.  Neurological:     Mental Status: She is alert and oriented to person, place, and time.  Psychiatric:        Mood and Affect: Mood normal.        Behavior: Behavior normal.        Thought Content: Thought content normal.        Judgment: Judgment normal.    Diabetic Foot Exam - Simple   Simple Foot Form Diabetic Foot exam was performed with the following findings: Yes 03/11/2024  2:00 PM  Visual Inspection No deformities, no ulcerations, no other skin breakdown bilaterally: Yes Sensation Testing See comments: Yes Pulse Check Posterior Tibialis and Dorsalis pulse intact bilaterally: Yes Comments Sensation intact bilaterally but slightly diminished on the right foot       BP 121/76 (BP Location: Left Arm, Patient Position: Sitting, Cuff Size: Large)   Pulse 91   Temp 98 F (36.7 C) (Oral)   Resp 16   LMP 06/17/2007   SpO2 96%  Wt Readings from Last 3 Encounters:  10/21/23 277 lb (125.6 kg)  01/02/23 277 lb (125.6 kg)  12/11/21 277 lb (125.6 kg)       Assessment & Plan:   Problem List Items Addressed This Visit       Unprioritized   Type 2 diabetes mellitus with other circulatory complications  (HCC) - Primary   Lab Results  Component Value Date   HGBA1C 6.8 (H) 09/26/2022   HGBA1C 6.4 12/11/2021   HGBA1C 6.7 (H) 08/02/2021   Lab Results  Component Value Date   LDLCALC 114 (H) 09/26/2022   CREATININE 0.89 10/21/2023   States rybelsus  is covered by insurance and she would like to try for weight loss. Continue farxiga .        Relevant Medications   Semaglutide  (RYBELSUS ) 3 MG TABS   Overactive bladder   Trial of ditropan , myrbetriq  and gemtesa are not on her formulary.      Relevant Medications   oxybutynin  (DITROPAN  XL) 5 MG 24 hr tablet   Obstructive sleep apnea  Not using CPAP, wishes to hold off on sleep apnea evaluation until her husband retires.      NICM (nonischemic cardiomyopathy) (HCC)   Increase torsemide  back to once daily- continue kdur and otc magnesium supplements to help with leg cramping.       Migraines   Uncontrolled- suspect uncontrolled OSA could be a contributor.  Discussed this with patient.      Hyperlipidemia   Lab Results  Component Value Date   CHOL 199 09/26/2022   HDL 62.30 09/26/2022   LDLCALC 114 (H) 09/26/2022   TRIG 112.0 09/26/2022   CHOLHDL 3 09/26/2022   Continues crestor , update lipid panel.       Relevant Orders   Lipid panel (Completed)   HTN (hypertension)   BP Readings from Last 3 Encounters:  03/11/24 121/76  10/21/23 126/76  10/14/23 121/85   At goal on losartan  and carvedilol .       Gout   Had some symptoms 2 weeks ago- admits to not taking allopurinol .  Recommended that she tries to take allopurinol  every day.       Esophageal reflux   Fair control on current meds.  Continue same as well as dietary modification.      Relevant Medications   pantoprazole  (PROTONIX ) 40 MG tablet   famotidine  (PEPCID ) 40 MG tablet   Dental infection   Has cracked tooth- plans to get in with dentist for possible extraction. She is requesting rx to have on handy for cruise in case infection flares up.       Relevant Medications   amoxicillin -clavulanate (AUGMENTIN ) 875-125 MG tablet   Other Visit Diagnoses       Pelvic pain       Relevant Orders   US  Pelvic Complete With Transvaginal     Breast cancer screening by mammogram       Relevant Orders   MM 3D SCREENING MAMMOGRAM BILATERAL BREAST     Postmenopausal estrogen deficiency       Relevant Orders   DG Bone Density     Need for pneumococcal 20-valent conjugate vaccination       Relevant Orders   Pneumococcal conjugate vaccine 20-valent (Prevnar 20) (Completed)     Cervical myelopathy with cervical radiculopathy (HCC)         Morbid obesity due to excess calories (HCC)       Relevant Medications   Semaglutide  (RYBELSUS ) 3 MG TABS       I have discontinued Thatiana D. Crespin's celecoxib , scopolamine , and Semaglutide (0.25 or 0.5MG /DOS). I have also changed her pantoprazole  and famotidine . Additionally, I am having her start on Rybelsus , oxybutynin , and amoxicillin -clavulanate. Lastly, I am having her maintain her latanoprost, timolol, cholecalciferol, Tart Cherry, acetaminophen , Accu-Chek Guide, spironolactone , albuterol , rosuvastatin , allopurinol , nystatin , montelukast , carvedilol , fluticasone , benzonatate , gabapentin , nortriptyline , Accu-Chek Softclix Lancets, Accu-Chek Guide Test, potassium chloride  SA, torsemide , losartan , ondansetron , and dapagliflozin  propanediol.  Meds ordered this encounter  Medications   Semaglutide  (RYBELSUS ) 3 MG TABS    Sig: Take 1 tablet (3 mg total) by mouth daily.    Dispense:  30 tablet    Refill:  0    Supervising Provider:   DOMENICA BLACKBIRD A [[id number]]   pantoprazole  (PROTONIX ) 40 MG tablet    Sig: Take 1 tablet (40 mg total) by mouth daily.    Dispense:  100 tablet    Refill:  1    Please send a replace/new response with 100-Day Supply if appropriate to maximize member benefit. Requesting 1 year supply.  Supervising Provider:   DOMENICA BLACKBIRD A [[id number]]   famotidine  (PEPCID ) 40 MG tablet    Sig: Take  1 tablet (40 mg total) by mouth daily.    Dispense:  100 tablet    Refill:  1    Please send a replace/new response with 100-Day Supply if appropriate to maximize member benefit. Requesting 1 year supply.    Supervising Provider:   DOMENICA BLACKBIRD A [[id number]]   oxybutynin  (DITROPAN  XL) 5 MG 24 hr tablet    Sig: Take 1 tablet (5 mg total) by mouth at bedtime.    Dispense:  100 tablet    Refill:  1    Supervising Provider:   DOMENICA BLACKBIRD A [[id number]]   amoxicillin -clavulanate (AUGMENTIN ) 875-125 MG tablet    Sig: Take 1 tablet by mouth 2 (two) times daily.    Dispense:  14 tablet    Refill:  0    Supervising Provider:   DOMENICA BLACKBIRD A [[id number]]

## 2024-03-11 NOTE — Assessment & Plan Note (Signed)
 Fair control on current meds.  Continue same as well as dietary modification.

## 2024-03-11 NOTE — Assessment & Plan Note (Signed)
 BP Readings from Last 3 Encounters:  03/11/24 121/76  10/21/23 126/76  10/14/23 121/85   At goal on losartan  and carvedilol .

## 2024-03-11 NOTE — Assessment & Plan Note (Signed)
 Uncontrolled- suspect uncontrolled OSA could be a contributor.  Discussed this with patient.

## 2024-03-12 LAB — HEMOGLOBIN A1C
Hgb A1c MFr Bld: 6.1 % — ABNORMAL HIGH (ref <5.7)
Mean Plasma Glucose: 128 mg/dL
eAG (mmol/L): 7.1 mmol/L

## 2024-03-12 LAB — COMPREHENSIVE METABOLIC PANEL WITH GFR
AG Ratio: 1.4 (calc) (ref 1.0–2.5)
ALT: 12 U/L (ref 6–29)
AST: 18 U/L (ref 10–35)
Albumin: 4.3 g/dL (ref 3.6–5.1)
Alkaline phosphatase (APISO): 88 U/L (ref 37–153)
BUN: 12 mg/dL (ref 7–25)
CO2: 25 mmol/L (ref 20–32)
Calcium: 9.1 mg/dL (ref 8.6–10.4)
Chloride: 103 mmol/L (ref 98–110)
Creat: 0.95 mg/dL (ref 0.50–1.05)
Globulin: 3.1 g/dL (ref 1.9–3.7)
Glucose, Bld: 101 mg/dL — ABNORMAL HIGH (ref 65–99)
Potassium: 4.2 mmol/L (ref 3.5–5.3)
Sodium: 139 mmol/L (ref 135–146)
Total Bilirubin: 0.4 mg/dL (ref 0.2–1.2)
Total Protein: 7.4 g/dL (ref 6.1–8.1)
eGFR: 66 mL/min/1.73m2 (ref >=60)

## 2024-03-12 LAB — LIPID PANEL
Cholesterol: 187 mg/dL (ref <200)
HDL: 71 mg/dL (ref >=50)
LDL Cholesterol (Calc): 99 mg/dL
Non-HDL Cholesterol (Calc): 116 mg/dL (ref <130)
Total CHOL/HDL Ratio: 2.6 (calc) (ref <5.0)
Triglycerides: 83 mg/dL (ref <150)

## 2024-03-14 ENCOUNTER — Ambulatory Visit: Payer: Self-pay | Admitting: Family

## 2024-03-18 ENCOUNTER — Ambulatory Visit: Admitting: Cardiology

## 2024-03-23 ENCOUNTER — Ambulatory Visit: Payer: Medicare Other

## 2024-03-23 DIAGNOSIS — I428 Other cardiomyopathies: Secondary | ICD-10-CM | POA: Diagnosis not present

## 2024-03-24 LAB — CUP PACEART REMOTE DEVICE CHECK
Battery Remaining Longevity: 47 mo
Battery Remaining Percentage: 56 %
Battery Voltage: 2.96 V
Brady Statistic AP VP Percent: 1 %
Brady Statistic AP VS Percent: 1 %
Brady Statistic AS VP Percent: 99 %
Brady Statistic AS VS Percent: 1 %
Brady Statistic RA Percent Paced: 1 %
Date Time Interrogation Session: 20251007035943
HighPow Impedance: 78 Ohm
HighPow Impedance: 78 Ohm
Implantable Lead Connection Status: 753985
Implantable Lead Connection Status: 753985
Implantable Lead Connection Status: 753985
Implantable Lead Implant Date: 20230403
Implantable Lead Implant Date: 20230403
Implantable Lead Implant Date: 20230403
Implantable Lead Location: 753858
Implantable Lead Location: 753859
Implantable Lead Location: 753860
Implantable Pulse Generator Implant Date: 20230403
Lead Channel Impedance Value: 410 Ohm
Lead Channel Impedance Value: 550 Ohm
Lead Channel Impedance Value: 840 Ohm
Lead Channel Pacing Threshold Amplitude: 0.75 V
Lead Channel Pacing Threshold Amplitude: 0.75 V
Lead Channel Pacing Threshold Amplitude: 1.25 V
Lead Channel Pacing Threshold Pulse Width: 0.5 ms
Lead Channel Pacing Threshold Pulse Width: 0.5 ms
Lead Channel Pacing Threshold Pulse Width: 0.5 ms
Lead Channel Sensing Intrinsic Amplitude: 5 mV
Lead Channel Sensing Intrinsic Amplitude: 6.5 mV
Lead Channel Setting Pacing Amplitude: 1.75 V
Lead Channel Setting Pacing Amplitude: 2 V
Lead Channel Setting Pacing Amplitude: 2.5 V
Lead Channel Setting Pacing Pulse Width: 0.5 ms
Lead Channel Setting Pacing Pulse Width: 0.5 ms
Lead Channel Setting Sensing Sensitivity: 0.5 mV
Pulse Gen Serial Number: 9986369
Zone Setting Status: 755011

## 2024-03-25 NOTE — Progress Notes (Signed)
 Remote ICD Transmission

## 2024-03-26 ENCOUNTER — Other Ambulatory Visit: Payer: Self-pay | Admitting: Cardiology

## 2024-03-26 ENCOUNTER — Other Ambulatory Visit: Payer: Self-pay | Admitting: Family

## 2024-03-26 DIAGNOSIS — R601 Generalized edema: Secondary | ICD-10-CM

## 2024-03-26 DIAGNOSIS — I5042 Chronic combined systolic (congestive) and diastolic (congestive) heart failure: Secondary | ICD-10-CM

## 2024-03-28 NOTE — Progress Notes (Signed)
 Remote ICD Transmission

## 2024-03-30 ENCOUNTER — Ambulatory Visit: Payer: Self-pay | Admitting: Internal Medicine

## 2024-04-19 ENCOUNTER — Other Ambulatory Visit: Payer: Self-pay | Admitting: Family

## 2024-04-19 DIAGNOSIS — E119 Type 2 diabetes mellitus without complications: Secondary | ICD-10-CM

## 2024-04-19 MED ORDER — RYBELSUS 7 MG PO TABS: 7.0000 mg | ORAL_TABLET | Freq: Every day | ORAL | 0 refills | Status: DC

## 2024-04-19 NOTE — Telephone Encounter (Signed)
 Copied from CRM #[id number]. Topic: Clinical - Medication Refill >> Apr 19, 2024  9:32 AM Jasmin G wrote: Medication: Semaglutide  (RYBELSUS ) 3 MG TABS. Pt requested for prescription to be filled for 5-6 months and to be upped to 7 MG.  Has the patient contacted their pharmacy? No (Agent: If no, request that the patient contact the pharmacy for the refill. If patient does not wish to contact the pharmacy document the reason why and proceed with request.) (Agent: If yes, when and what did the pharmacy advise?)  This is the patient's preferred pharmacy:  [facility] - [place], [place] - [address] [place] [address] [place] [address] Phone: [phone] Fax: [phone]  Is this the correct pharmacy for this prescription? Yes If no, delete pharmacy and type the correct one.   Has the prescription been filled recently? Yes  Is the patient out of the medication? No  Has the patient been seen for an appointment in the last year OR does the patient have an upcoming appointment? Yes  Can we respond through MyChart? No  Agent: Please be advised that Rx refills may take up to 3 business days. We ask that you follow-up with your pharmacy.

## 2024-04-19 NOTE — Telephone Encounter (Signed)
 Rx sent for 7 mg to her mail order. I would like to see her back before Christmas to follow up on her progress.

## 2024-04-24 ENCOUNTER — Other Ambulatory Visit: Payer: Self-pay | Admitting: Family

## 2024-04-27 NOTE — Telephone Encounter (Signed)
LMOM asking for call back to schedule appt.  

## 2024-04-27 NOTE — Progress Notes (Signed)
 Briana Miller                                          MRN: [id number]   04/27/2024   The [facility] Quality Team Specialist reviewed this patient medical record for the purposes of chart review for care gap closure. The following were reviewed: chart review for care gap closure-kidney health evaluation for diabetes:eGFR  and uACR. uACR ordered but not yet completed.    [facility] Quality Team

## 2024-04-30 ENCOUNTER — Other Ambulatory Visit: Payer: Self-pay | Admitting: Family

## 2024-05-10 ENCOUNTER — Telehealth: Payer: Self-pay | Admitting: Family

## 2024-05-10 ENCOUNTER — Telehealth (INDEPENDENT_AMBULATORY_CARE_PROVIDER_SITE_OTHER): Admitting: Family

## 2024-05-10 ENCOUNTER — Other Ambulatory Visit: Payer: Self-pay | Admitting: Family

## 2024-05-10 DIAGNOSIS — R197 Diarrhea, unspecified: Secondary | ICD-10-CM

## 2024-05-10 DIAGNOSIS — J069 Acute upper respiratory infection, unspecified: Secondary | ICD-10-CM

## 2024-05-10 MED ORDER — BENZONATATE 100 MG PO CAPS: 100.0000 mg | ORAL_CAPSULE | Freq: Three times a day (TID) | ORAL | 0 refills | Status: AC | PRN

## 2024-05-10 NOTE — Patient Instructions (Signed)
  VISIT SUMMARY: Today, you were seen for diarrhea and upper respiratory symptoms. You have been experiencing diarrhea for about a week, with several episodes of stool incontinence, but no fever or recent antibiotic use. You also have upper respiratory symptoms, similar to your husband's cold symptoms.  YOUR PLAN: -ACUTE UPPER RESPIRATORY INFECTION: An acute upper respiratory infection is a viral infection that affects the nose, throat, and airways. You do not have a fever, and we have started symptomatic treatment with Tessalon  Perles to help relieve your symptoms.  -DIARRHEA: Diarrhea is the frequent passage of loose or watery stools. You have had several episodes of stool incontinence and diarrhea without a fever or recent antibiotic use. We have ordered a GI pathogen panel for you to complete at home, and you can use Imodium as needed. Please contact us  if your symptoms worsen or do not improve by next Monday for an in-person visit.  INSTRUCTIONS: Please complete the GI pathogen panel at home as instructed. Use Imodium as needed for diarrhea. If your symptoms worsen or do not improve by next Monday, contact us  for an in-person visit.

## 2024-05-10 NOTE — Progress Notes (Signed)
 MyChart Video Visit    Virtual Visit via Video Note    Patient location: Home. Patient and provider in visit Provider location: Office  I discussed the limitations of evaluation and management by telemedicine and the availability of in person appointments. The patient expressed understanding and agreed to proceed.  Visit Date: 05/10/2024  Today's healthcare provider: Eleanor GORMAN Ponto, NP     Subjective:    Patient ID: Briana Miller, female    DOB: [birth year], 68 y.o.   MRN: [id number]  Chief Complaint  Patient presents with   Nasal Congestion    Runny nose    Cough    Cough  Discussed the use of AI scribe software for clinical note transcription with the patient, who gave verbal consent to proceed.  History of Present Illness Briana Miller is a 68 year old female who presents with diarrhea and upper respiratory symptoms.  She began experiencing diarrhea a week ago Friday, characterized by several episodes of stool incontinence during sleep and diarrhea when using the toilet. No recent antibiotic use and no fever. No other household members have experienced diarrhea.  She also presents with upper respiratory symptoms. Her husband has experienced some cold symptoms.    Past Medical History:  Diagnosis Date   Acute systolic congestive heart failure (HCC) 03/01/2014   Allergy    Anemia    Arthritis    Cardiomyopathy (HCC)    Cervical myelopathy (HCC)    CHF (congestive heart failure) (HCC)    Chronic pain of left knee 09/14/2015   GERD (gastroesophageal reflux disease)    Gout    Heart disease    Hyperlipidemia    Hypertension    Hypertension, benign essential, goal below 140/90 11/03/2014   Impingement syndrome of left shoulder 09/07/2014   Knee joint pain 06/07/2013   Leg pain    Obesity, Class III, BMI 40-49.9 (morbid obesity) 01/26/2015   Overactive bladder    S/P arthroscopy of shoulder 03/28/2013   Shoulder joint pain  12/16/2012   Status post total right knee replacement using cement 12/11/2015    Past Surgical History:  Procedure Laterality Date   ANTERIOR CERVICAL DECOMP/DISCECTOMY FUSION N/A 08/03/2016   Procedure: ANTERIOR CERVICAL DECOMPRESSION/DISCECTOMY FUSION C4-5, C5-6;  Surgeon: Victory Gens, MD;  Location[facility] OR;  Service: Neurosurgery;  Laterality: N/A;   BIV ICD INSERTION CRT-D N/A 09/16/2021   Procedure: BIV ICD INSERTION CRT-D;  Surgeon: Waddell Danelle ORN, MD;  Location: [facility] INVASIVE CV LAB;  Service: Cardiovascular;  Laterality: N/A;   COLONOSCOPY     COLONOSCOPY WITH PROPOFOL  N/A 02/24/2019   Procedure: COLONOSCOPY WITH PROPOFOL ;  Surgeon: Teressa Toribio SQUIBB, MD;  Location: [facility] ENDOSCOPY;  Service: Endoscopy;  Laterality: N/A;   ESOPHAGOGASTRODUODENOSCOPY (EGD) WITH PROPOFOL  N/A 04/25/2021   Procedure: ESOPHAGOGASTRODUODENOSCOPY (EGD) WITH PROPOFOL ;  Surgeon: Teressa Toribio SQUIBB, MD;  Location: [facility] ENDOSCOPY;  Service: Endoscopy;  Laterality: N/A;   KNEE ARTHROSCOPY     2007   ROTATOR CUFF REPAIR Right 03/23/13   TOTAL KNEE ARTHROPLASTY  09/22/2011   Procedure: TOTAL KNEE ARTHROPLASTY;  Surgeon: Garnette JONETTA Raman, MD;  Location: [facility] OR;  Service: Orthopedics;  Laterality: Right;    Family History  Problem Relation Age of Onset   Cancer Mother        colon 84 and breast 11- deceased   Colon cancer Mother    Diabetes Father        living   Asthma Father    Hypertension Father  Heart attack Father 64       medical management per pt   Glaucoma Father        had eye transplant   Kidney failure Father        acute renal failure- died 47   Esophageal cancer Neg Hx    Stomach cancer Neg Hx    Rectal cancer Neg Hx     Social History   Socioeconomic History   Marital status: Married    Spouse name: Not on file   Number of children: 2   Years of education: HS   Highest education level: Some college, no degree  Occupational History   Occupation: Acupuncturist: [employer]  Tobacco  Use   Smoking status: Never   Smokeless tobacco: Never  Vaping Use   Vaping status: Never Used  Substance and Sexual Activity   Alcohol use: Not Currently    Alcohol/week: 2.0 standard drinks of alcohol    Types: 2 Glasses of wine per week   Drug use: No   Sexual activity: Not on file  Other Topics Concern   Not on file  Social History Narrative   Married- 32 years   2 children- grown daughter- lives in [place]- has 2 children   Grown son lives in [place]- 4 children   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   1-2 cups caffeine daily.   Lives at home with her husband.   Social Drivers of Corporate Investment Banker Strain: Low Risk  (07/22/2023)   Overall Financial Resource Strain (CARDIA)    Difficulty of Paying Living Expenses: Not hard at all  Food Insecurity: No Food Insecurity (07/22/2023)   Hunger Vital Sign    Worried About Running Out of Food in the Last Year: Never true    Ran Out of Food in the Last Year: Never true  Transportation Needs: No Transportation Needs (07/22/2023)   PRAPARE - Administrator, Civil Service (Medical): No    Lack of Transportation (Non-Medical): No  Physical Activity: Unknown (07/22/2023)   Exercise Vital Sign    Days of Exercise per Week: 0 days    Minutes of Exercise per Session: Not on file  Stress: No Stress Concern Present (07/22/2023)   [employer] of Occupational Health - Occupational Stress Questionnaire    Feeling of Stress : Only a little  Social Connections: Moderately Isolated (07/22/2023)   Social Connection and Isolation Panel    Frequency of Communication with Friends and Family: Once a week    Frequency of Social Gatherings with Friends and Family: Once a week    Attends Religious Services: 1 to 4 times per year    Active Member of [employer] or Organizations: No    Attends Engineer, Structural: Not on file     Marital Status: Married  Catering Manager Violence: Not on file    Outpatient Medications Prior to Visit  Medication Sig Dispense Refill   acetaminophen  (TYLENOL ) 500 MG tablet Take 1,000 mg by mouth every 8 (eight) hours as needed for moderate pain or headache.     albuterol  (VENTOLIN  HFA) 108 (90 Base) MCG/ACT inhaler USE 2 INHALATIONS BY MOUTH EVERY 6 HOURS AS NEEDED 34 g 2   allopurinol  (ZYLOPRIM ) 100 MG tablet TAKE 1 TABLET BY MOUTH DAILY 100 tablet 2   carvedilol  (COREG ) 12.5 MG  tablet TAKE 1 TABLET BY MOUTH TWICE  DAILY 180 tablet 3   cholecalciferol (VITAMIN D3) 25 MCG (1000 UNIT) tablet Take 1,000 Units by mouth daily.     dapagliflozin  propanediol (FARXIGA ) 10 MG TABS tablet Take 1 tablet (10 mg total) by mouth daily. 90 tablet 0   famotidine  (PEPCID ) 40 MG tablet Take 1 tablet (40 mg total) by mouth daily. 100 tablet 1   fluticasone  (FLONASE ) 50 MCG/ACT nasal spray SHAKE LIQUID AND USE 2 SPRAYS IN EACH NOSTRIL DAILY 16 g 1   gabapentin  (NEURONTIN ) 300 MG capsule TAKE 3 CAPSULES BY MOUTH 3 TIMES DAILY 900 capsule 2   latanoprost (XALATAN) 0.005 % ophthalmic solution Place 1 drop into both eyes at bedtime.   11   losartan  (COZAAR ) 25 MG tablet TAKE 1 TABLET BY MOUTH DAILY 100 tablet 2   montelukast  (SINGULAIR ) 10 MG tablet TAKE 1 TABLET BY MOUTH AT  BEDTIME 90 tablet 1   nortriptyline  (PAMELOR ) 10 MG capsule TAKE 1 CAPSULE BY MOUTH AT  BEDTIME FOR A WEEK, THEN 2  CAPSULES BY MOUTH AT BEDTIME 180 capsule 3   nystatin  (NYAMYC ) powder APPLY TOPICALLY TO THE AFFECTED  AREA 3 TIMES DAILY AS NEEDED FOR RASH 360 g 1   ondansetron  (ZOFRAN ) 4 MG tablet Take 1 tablet (4 mg total) by mouth every 8 (eight) hours as needed for nausea or vomiting. 270 tablet 0   oxybutynin  (DITROPAN  XL) 5 MG 24 hr tablet Take 1 tablet (5 mg total) by mouth at bedtime. 100 tablet 1   pantoprazole  (PROTONIX ) 40 MG tablet Take 1 tablet (40 mg total) by mouth daily. 100 tablet 1   potassium chloride  SA (KLOR-CON  M)  20 MEQ tablet TAKE 1 TABLET BY MOUTH DAILY 90 tablet 3   rosuvastatin  (CRESTOR ) 10 MG tablet Take 1 tablet (10 mg total) by mouth daily. For cholesterol and heart. 90 tablet 3   Semaglutide  (RYBELSUS ) 7 MG TABS Take 1 tablet (7 mg total) by mouth daily. 90 tablet 0   Tart Cherry 1200 MG CAPS Take 1,200 mg by mouth 2 (two) times a week.     timolol (BETIMOL) 0.5 % ophthalmic solution Place 1 drop into both eyes every morning.      torsemide  (DEMADEX ) 20 MG tablet TAKE 2 TABLETS BY MOUTH  ALTERNATING WITH 1 TABLET BY  MOUTH EVERY OTHER DAY 135 tablet 0   ACCU-CHEK GUIDE TEST test strip TEST UP TO 4 TIMES DAILY (Patient not taking: Reported on 05/10/2024) 400 strip 2   Accu-Chek Softclix Lancets lancets TEST UP TO 4 TIMES DAILY (Patient not taking: Reported on 05/10/2024) 400 each 2   Blood Glucose Monitoring Suppl (ACCU-CHEK GUIDE) w/Device KIT USE AS DIRECTED (Patient not taking: Reported on 05/10/2024) 1 kit 0   amoxicillin -clavulanate (AUGMENTIN ) 875-125 MG tablet Take 1 tablet by mouth 2 (two) times daily. 14 tablet 0   benzonatate  (TESSALON ) 100 MG capsule Take 1 capsule (100 mg total) by mouth 3 (three) times daily as needed. 20 capsule 0   spironolactone  (ALDACTONE ) 25 MG tablet TAKE 1 TABLET BY MOUTH DAILY 90 tablet 3   No facility-administered medications prior to visit.    Allergies  Allergen Reactions   Entresto  [Sacubitril-Valsartan] Other (See Comments)    Dry mouth    Review of Systems  Respiratory:  Positive for cough.        Objective:    Physical Exam Constitutional:      General: She is not in acute distress.  Appearance: Normal appearance. She is well-developed.  HENT:     Head: Normocephalic and atraumatic.     Right Ear: External ear normal.     Left Ear: External ear normal.  Eyes:     General: No scleral icterus. Neck:     Thyroid : No thyromegaly.  Cardiovascular:     Rate and Rhythm: Normal rate and regular rhythm.     Heart sounds: Normal heart  sounds. No murmur heard. Pulmonary:     Effort: Pulmonary effort is normal. No respiratory distress.     Breath sounds: Normal breath sounds. No wheezing.  Musculoskeletal:     Cervical back: Neck supple.  Skin:    General: Skin is warm and dry.  Neurological:     Mental Status: She is alert and oriented to person, place, and time.  Psychiatric:        Mood and Affect: Mood normal.        Behavior: Behavior normal.        Thought Content: Thought content normal.        Judgment: Judgment normal.     LMP 06/17/2007  Wt Readings from Last 3 Encounters:  10/21/23 277 lb (125.6 kg)  01/02/23 277 lb (125.6 kg)  12/11/21 277 lb (125.6 kg)    Gen: Awake, alert, no acute distress Resp: Breathing is even and non-labored Psych: calm/pleasant demeanor Neuro: Alert and Oriented x 3, + facial symmetry, speech is clear.     Assessment & Plan:   Problem List Items Addressed This Visit   None Visit Diagnoses       Viral URI with cough    -  Primary   Relevant Medications   benzonatate  (TESSALON ) 100 MG capsule     Diarrhea, unspecified type       Relevant Orders   GI pathogen panel by PCR, stool     Acute upper respiratory infection No fever. Symptomatic treatment initiated. - Treated with Tessalon  Perles for symptomatic relief.  Diarrhea Several episodes of stool incontinence and diarrhea. No fever. No recent antibiotic use. - Ordered GI pathogen panel for home completion. - Advised use of Imodium as needed. - Instructed to contact provider if symptoms worsen or persist by next Monday for in-person visit. Assessment & Plan     I have discontinued Erminio BIRCH. Marion's spironolactone , benzonatate , and amoxicillin -clavulanate. I am also having her start on benzonatate . Additionally, I am having her maintain her latanoprost, timolol, cholecalciferol, Tart Cherry, acetaminophen , Accu-Chek Guide, albuterol , rosuvastatin , allopurinol , montelukast , carvedilol , fluticasone ,  gabapentin , nortriptyline , Accu-Chek Softclix Lancets, Accu-Chek Guide Test, losartan , ondansetron , dapagliflozin  propanediol, pantoprazole , famotidine , oxybutynin , torsemide , potassium chloride  SA, Rybelsus , and nystatin .  Meds ordered this encounter  Medications   benzonatate  (TESSALON ) 100 MG capsule    Sig: Take 1 capsule (100 mg total) by mouth 3 (three) times daily as needed.    Dispense:  30 capsule    Refill:  0    Supervising Provider:   DOMENICA BLACKBIRD A [[id number]]    I discussed the assessment and treatment plan with the patient. The patient was provided an opportunity to ask questions and all were answered. The patient agreed with the plan and demonstrated an understanding of the instructions.   The patient was advised to call back or seek an in-person evaluation if the symptoms worsen or if the condition fails to improve as anticipated.  Eleanor GORMAN Ponto, NP [facility] [phone] (phone) [phone] (fax)  [facility]

## 2024-05-10 NOTE — Telephone Encounter (Signed)
 I placed a future stool panel order. Can you please coordinate with patient so that her family can pick up the specimen kit?

## 2024-05-11 NOTE — Addendum Note (Signed)
 Addended by: DARYL SETTER on: 05/11/2024 09:05 AM   Modules accepted: Orders

## 2024-05-11 NOTE — Telephone Encounter (Signed)
 Patient is trying to arrange pick up for today. Lab tech will have kit ready for them.

## 2024-05-16 NOTE — Telephone Encounter (Signed)
 Per patient she was not able to have someone come in to pick up stool kit. She reports diarrhea has stopped and she will wait and see how she does.

## 2024-05-19 ENCOUNTER — Other Ambulatory Visit: Payer: Self-pay | Admitting: Cardiology

## 2024-05-25 MED ORDER — DAPAGLIFLOZIN PROPANEDIOL 10 MG PO TABS: 10.0000 mg | ORAL_TABLET | Freq: Every day | ORAL | 0 refills | Status: AC

## 2024-06-08 NOTE — Progress Notes (Signed)
 "    HPI: FU cardiomyopathy. Echocardiogram in October of 2014 showed an ejection fraction of 40-45%, mild left ventricular hypertrophy and mild left atrial enlargement. Cardiac CTA August 2021 showed calcium  score of 0 and no coronary disease. Entresto  was discontinued previously as patient thought it was causing dry mouth.  Cardiac MRI February 2023 showed ejection fraction 31% with septal dyskinesis consistent with left bundle branch block but no infiltrative process.  Patient had biventricular ICD placed April 2023.  Echocardiogram April 2025 showed ejection fraction 35 to 40%, grade 1 diastolic dysfunction, mild to moderate left atrial enlargement, moderate to severe mitral regurgitation, moderate tricuspid regurgitation.  Since last seen she has dyspnea on exertion unchanged.  No orthopnea, PND, pedal edema, chest pain or syncope.  She is only taking her Demadex  3 times weekly due to muscle cramps.  Current Outpatient Medications  Medication Sig Dispense Refill   ACCU-CHEK GUIDE TEST test strip TEST UP TO 4 TIMES DAILY 400 strip 2   Accu-Chek Softclix Lancets lancets TEST UP TO 4 TIMES DAILY 400 each 2   acetaminophen  (TYLENOL ) 500 MG tablet Take 1,000 mg by mouth every 8 (eight) hours as needed for moderate pain or headache.     albuterol  (VENTOLIN  HFA) 108 (90 Base) MCG/ACT inhaler USE 2 INHALATIONS BY MOUTH EVERY 6 HOURS AS NEEDED 34 g 2   allopurinol  (ZYLOPRIM ) 100 MG tablet TAKE 1 TABLET BY MOUTH DAILY 100 tablet 2   benzonatate  (TESSALON ) 100 MG capsule Take 1 capsule (100 mg total) by mouth 3 (three) times daily as needed. 30 capsule 0   Blood Glucose Monitoring Suppl (ACCU-CHEK GUIDE) w/Device KIT USE AS DIRECTED 1 kit 0   carvedilol  (COREG ) 12.5 MG tablet TAKE 1 TABLET BY MOUTH TWICE  DAILY 180 tablet 3   cholecalciferol (VITAMIN D3) 25 MCG (1000 UNIT) tablet Take 1,000 Units by mouth daily. (Patient taking differently: Take 1,000 Units by mouth 3 (three) times a week.)      dapagliflozin  propanediol (FARXIGA ) 10 MG TABS tablet Take 1 tablet (10 mg total) by mouth daily. 90 tablet 0   famotidine  (PEPCID ) 40 MG tablet Take 1 tablet (40 mg total) by mouth daily. 100 tablet 1   fluticasone  (FLONASE ) 50 MCG/ACT nasal spray Place 2 sprays into both nostrils daily. (Patient taking differently: Place 2 sprays into both nostrils as needed.) 16 g 1   gabapentin  (NEURONTIN ) 300 MG capsule TAKE 3 CAPSULES BY MOUTH 3 TIMES DAILY 900 capsule 2   latanoprost (XALATAN) 0.005 % ophthalmic solution Place 1 drop into both eyes at bedtime.   11   losartan  (COZAAR ) 25 MG tablet TAKE 1 TABLET BY MOUTH DAILY 100 tablet 2   montelukast  (SINGULAIR ) 10 MG tablet TAKE 1 TABLET BY MOUTH AT  BEDTIME (Patient taking differently: Take 10 mg by mouth as needed.) 90 tablet 1   nortriptyline  (PAMELOR ) 10 MG capsule TAKE 1 CAPSULE BY MOUTH AT  BEDTIME FOR A WEEK, THEN 2  CAPSULES BY MOUTH AT BEDTIME 180 capsule 3   nystatin  (NYAMYC ) powder APPLY TOPICALLY TO THE AFFECTED  AREA 3 TIMES DAILY AS NEEDED FOR RASH 360 g 1   ondansetron  (ZOFRAN ) 4 MG tablet TAKE 1 TABLET BY MOUTH EVERY 8  HOURS AS NEEDED FOR NAUSEA AND  VOMITING 270 tablet 0   pantoprazole  (PROTONIX ) 40 MG tablet Take 1 tablet (40 mg total) by mouth daily. 100 tablet 1   potassium chloride  SA (KLOR-CON  M) 20 MEQ tablet TAKE 1 TABLET BY MOUTH DAILY  90 tablet 3   rosuvastatin  (CRESTOR ) 10 MG tablet Take 1 tablet (10 mg total) by mouth daily. For cholesterol and heart. 90 tablet 3   Semaglutide  (RYBELSUS ) 7 MG TABS Take 1 tablet (7 mg total) by mouth daily. 90 tablet 0   Tart Cherry 1200 MG CAPS Take 1,200 mg by mouth 2 (two) times a week.     timolol (BETIMOL) 0.5 % ophthalmic solution Place 1 drop into both eyes every morning.      torsemide  (DEMADEX ) 20 MG tablet TAKE 2 TABLETS BY MOUTH  ALTERNATING WITH 1 TABLET BY  MOUTH EVERY OTHER DAY 135 tablet 0   No current facility-administered medications for this visit.     Past Medical  History:  Diagnosis Date   Acute systolic congestive heart failure (HCC) 03/01/2014   Allergy    Anemia    Arthritis    Cardiomyopathy (HCC)    Cervical myelopathy (HCC)    CHF (congestive heart failure) (HCC)    Chronic pain of left knee 09/14/2015   GERD (gastroesophageal reflux disease)    Gout    Heart disease    Hyperlipidemia    Hypertension    Hypertension, benign essential, goal below 140/90 11/03/2014   Impingement syndrome of left shoulder 09/07/2014   Knee joint pain 06/07/2013   Leg pain    Obesity, Class III, BMI 40-49.9 (morbid obesity) (HCC) 01/26/2015   Overactive bladder    S/P arthroscopy of shoulder 03/28/2013   Shoulder joint pain 12/16/2012   Status post total right knee replacement using cement 12/11/2015    Past Surgical History:  Procedure Laterality Date   ANTERIOR CERVICAL DECOMP/DISCECTOMY FUSION N/A 08/03/2016   Procedure: ANTERIOR CERVICAL DECOMPRESSION/DISCECTOMY FUSION C4-5, C5-6;  Surgeon: Victory Gens, MD;  Location: [facility] OR;  Service: Neurosurgery;  Laterality: N/A;   BIV ICD INSERTION CRT-D N/A 09/16/2021   Procedure: BIV ICD INSERTION CRT-D;  Surgeon: Waddell Danelle ORN, MD;  Location: [facility] INVASIVE CV LAB;  Service: Cardiovascular;  Laterality: N/A;   COLONOSCOPY     COLONOSCOPY WITH PROPOFOL  N/A 02/24/2019   Procedure: COLONOSCOPY WITH PROPOFOL ;  Surgeon: Teressa Toribio SQUIBB, MD;  Location: [facility] ENDOSCOPY;  Service: Endoscopy;  Laterality: N/A;   ESOPHAGOGASTRODUODENOSCOPY (EGD) WITH PROPOFOL  N/A 04/25/2021   Procedure: ESOPHAGOGASTRODUODENOSCOPY (EGD) WITH PROPOFOL ;  Surgeon: Teressa Toribio SQUIBB, MD;  Location: [facility]OSCOPY;  Service: Endoscopy;  Laterality: N/A;   KNEE ARTHROSCOPY     2007   ROTATOR CUFF REPAIR Right 03/23/13   TOTAL KNEE ARTHROPLASTY  09/22/2011   Procedure: TOTAL KNEE ARTHROPLASTY;  Surgeon: Garnette JONETTA Raman, MD;  Location: [facility] OR;  Service: Orthopedics;  Laterality: Right;    Social History   Socioeconomic History   Marital status:  Married    Spouse name: Not on file   Number of children: 2   Years of education: HS   Highest education level: Some college, no degree  Occupational History   Occupation: Acupuncturist: [employer]  Tobacco Use   Smoking status: Never   Smokeless tobacco: Never  Vaping Use   Vaping status: Never Used  Substance and Sexual Activity   Alcohol use: Not Currently    Alcohol/week: 2.0 standard drinks of alcohol    Types: 2 Glasses of wine per week   Drug use: No   Sexual activity: Not on file  Other Topics Concern   Not on file  Social History Narrative   Married- 32 years   2 children- grown daughter- lives  in [place]- has 2 children   Grown son lives in [place]- 4 children   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   1-2 cups caffeine daily.   Lives at home with her husband.   Social Drivers of Health   Tobacco Use: Low Risk (06/17/2024)   Patient History    Smoking Tobacco Use: Never    Smokeless Tobacco Use: Never    Passive Exposure: Not on file  Financial Resource Strain: Low Risk (07/22/2023)   Overall Financial Resource Strain (CARDIA)    Difficulty of Paying Living Expenses: Not hard at all  Food Insecurity: No Food Insecurity (07/22/2023)   Hunger Vital Sign    Worried About Running Out of Food in the Last Year: Never true    Ran Out of Food in the Last Year: Never true  Transportation Needs: No Transportation Needs (07/22/2023)   PRAPARE - Administrator, Civil Service (Medical): No    Lack of Transportation (Non-Medical): No  Physical Activity: Unknown (07/22/2023)   Exercise Vital Sign    Days of Exercise per Week: 0 days    Minutes of Exercise per Session: Not on file  Stress: No Stress Concern Present (07/22/2023)   [employer] of Occupational Health - Occupational Stress Questionnaire    Feeling of Stress : Only a little  Social  Connections: Moderately Isolated (07/22/2023)   Social Connection and Isolation Panel    Frequency of Communication with Friends and Family: Once a week    Frequency of Social Gatherings with Friends and Family: Once a week    Attends Religious Services: 1 to 4 times per year    Active Member of [employer] or Organizations: No    Attends Engineer, Structural: Not on file    Marital Status: Married  Catering Manager Violence: Not on file  Depression (PHQ2-9): Medium Risk (03/11/2024)   Depression (PHQ2-9)    PHQ-2 Score: 5  Alcohol Screen: Low Risk (07/22/2023)   Alcohol Screen    Last Alcohol Screening Score (AUDIT): 1  Housing: Low Risk (07/22/2023)   Housing Stability Vital Sign    Unable to Pay for Housing in the Last Year: No    Number of Times Moved in the Last Year: 0    Homeless in the Last Year: No  Utilities: Not on file  Health Literacy: Not on file    Family History  Problem Relation Age of Onset   Cancer Mother        colon 70 and breast 23- deceased   Colon cancer Mother    Diabetes Father        living   Asthma Father    Hypertension Father    Heart attack Father 71       medical management per pt   Glaucoma Father        had eye transplant   Kidney failure Father        acute renal failure- died 65   Esophageal cancer Neg Hx    Stomach cancer Neg Hx    Rectal cancer Neg Hx     ROS: no fevers or chills, productive cough, hemoptysis, dysphasia, odynophagia, melena, hematochezia, dysuria, hematuria, rash, seizure activity, orthopnea, PND, pedal edema, claudication. Remaining systems are negative.  Physical Exam: Well-developed obese in no acute distress.  Skin is warm and dry.  HEENT is normal.  Neck is supple.  Chest is clear to auscultation with normal expansion.  Cardiovascular exam is regular rate and rhythm.  Abdominal exam nontender or distended. No masses palpated. Extremities show no edema. neuro grossly intact  ECG- personally  reviewed  A/P  1 nonischemic cardiomyopathy-patient with question dry mouth to Entresto  previously.  I would like to retry this medication.  Discontinue losartan  and begin 24/26 twice daily.  Add spironolactone  12.5 mg daily and continue carvedilol  and Farxiga .  Check potassium and renal function in 1 week.    2 chronic systolic congestive heart failure-she is euvolemic on examination.  She is only taking Demadex  3 times weekly.  Add spironolactone  12.5 mg daily and Farxiga .  She will take an additional Demadex  daily as needed.  3 moderate to severe mitral regurgitation-Will plan follow-up echocardiogram April 2026.  4 CRT-D-follow-up electrophysiology.  5 hypertension-patient's blood pressure is controlled.  Continue present medications.  6 obstructive sleep apnea-per pulmonary.  7 morbid obesity-we discussed importance of weight loss.  Redell Shallow, MD    "

## 2024-06-17 ENCOUNTER — Other Ambulatory Visit (HOSPITAL_COMMUNITY): Payer: Self-pay

## 2024-06-17 ENCOUNTER — Encounter: Payer: Self-pay | Admitting: Cardiology

## 2024-06-17 ENCOUNTER — Ambulatory Visit: Attending: Cardiology | Admitting: Cardiology

## 2024-06-17 VITALS — BP 120/76 | HR 84 | Ht 66.0 in | Wt 277.0 lb

## 2024-06-17 DIAGNOSIS — Z95810 Presence of automatic (implantable) cardiac defibrillator: Secondary | ICD-10-CM | POA: Diagnosis not present

## 2024-06-17 DIAGNOSIS — I5042 Chronic combined systolic (congestive) and diastolic (congestive) heart failure: Secondary | ICD-10-CM

## 2024-06-17 DIAGNOSIS — I059 Rheumatic mitral valve disease, unspecified: Secondary | ICD-10-CM

## 2024-06-17 DIAGNOSIS — I428 Other cardiomyopathies: Secondary | ICD-10-CM

## 2024-06-17 DIAGNOSIS — I349 Nonrheumatic mitral valve disorder, unspecified: Secondary | ICD-10-CM

## 2024-06-17 MED ORDER — SACUBITRIL-VALSARTAN 24-26 MG PO TABS: 1.0000 | ORAL_TABLET | Freq: Two times a day (BID) | ORAL | 11 refills | Status: AC

## 2024-06-17 MED ORDER — FLUZONE HIGH-DOSE 0.5 ML IM SUSY
0.5000 mL | PREFILLED_SYRINGE | Freq: Once | INTRAMUSCULAR | 0 refills | Status: AC
  Filled 2024-06-17: qty 0.5, 1d supply, fill #0

## 2024-06-17 MED ORDER — SPIRONOLACTONE 25 MG PO TABS: 12.5000 mg | ORAL_TABLET | Freq: Every day | ORAL | 3 refills | Status: AC

## 2024-06-17 NOTE — Patient Instructions (Signed)
 Medication Instructions:   STOP LOSARTAN   START ENTRESTO  24/26 MG ONE TABLET TWICE DAILY  START SPIRONOLACTONE  12.5 MG ONCE DAILY= 1/2 OF THE 25 MG TABLET ONCE DAILY  *If you need a refill on your cardiac medications before your next appointment, please call your pharmacy*  Lab Work:  Your physician recommends that you return for lab work in: ONE WEEK-DO NOT NEED TO FAST  If you have labs (blood work) drawn today and your tests are completely normal, you will receive your results only by: MyChart Message (if you have MyChart) OR A paper copy in the mail If you have any lab test that is abnormal or we need to change your treatment, we will call you to review the results.  Testing/Procedures:  Your physician has requested that you have an echocardiogram. Echocardiography is a painless test that uses sound waves to create images of your heart. It provides your doctor with information about the size and shape of your heart and how well your hearts chambers and valves are working. This procedure takes approximately one hour. There are no restrictions for this procedure. Please do NOT wear cologne, perfume, aftershave, or lotions (deodorant is allowed). Please arrive 15 minutes prior to your appointment time.  Please note: We ask at that you not bring children with you during ultrasound (echo/ vascular) testing. Due to room size and safety concerns, children are not allowed in the ultrasound rooms during exams. Our front office staff cannot provide observation of children in our lobby area while testing is being conducted. An adult accompanying a patient to their appointment will only be allowed in the ultrasound room at the discretion of the ultrasound technician under special circumstances. We apologize for any inconvenience. [address]- PLEASE SCHEDULE IN APRIL  Follow-Up: At [facility], you and your health needs are our priority.  As part of our continuing mission to  provide you with exceptional heart care, our providers are all part of one team.  This team includes your primary Cardiologist (physician) and Advanced Practice Providers or APPs (Physician Assistants and Nurse Practitioners) who all work together to provide you with the care you need, when you need it.  Your next appointment:   6 month(s)  Provider:   Redell Shallow, MD

## 2024-06-20 DIAGNOSIS — E119 Type 2 diabetes mellitus without complications: Secondary | ICD-10-CM

## 2024-06-22 ENCOUNTER — Ambulatory Visit

## 2024-06-22 DIAGNOSIS — I428 Other cardiomyopathies: Secondary | ICD-10-CM | POA: Diagnosis not present

## 2024-06-23 ENCOUNTER — Ambulatory Visit: Payer: Self-pay | Admitting: Cardiology

## 2024-06-23 ENCOUNTER — Telehealth: Payer: Self-pay

## 2024-06-23 DIAGNOSIS — I5022 Chronic systolic (congestive) heart failure: Secondary | ICD-10-CM

## 2024-06-23 LAB — CUP PACEART REMOTE DEVICE CHECK
Battery Remaining Longevity: 44 mo
Battery Remaining Percentage: 52 %
Battery Voltage: 2.96 V
Brady Statistic AP VP Percent: 1 %
Brady Statistic AP VS Percent: 1 %
Brady Statistic AS VP Percent: 99 %
Brady Statistic AS VS Percent: 1 %
Brady Statistic RA Percent Paced: 1 %
Date Time Interrogation Session: 20260107133522
HighPow Impedance: 77 Ohm
HighPow Impedance: 77 Ohm
Implantable Lead Connection Status: 753985
Implantable Lead Connection Status: 753985
Implantable Lead Connection Status: 753985
Implantable Lead Implant Date: 20230403
Implantable Lead Implant Date: 20230403
Implantable Lead Implant Date: 20230403
Implantable Lead Location: 753858
Implantable Lead Location: 753859
Implantable Lead Location: 753860
Implantable Pulse Generator Implant Date: 20230403
Lead Channel Impedance Value: 390 Ohm
Lead Channel Impedance Value: 530 Ohm
Lead Channel Impedance Value: 860 Ohm
Lead Channel Pacing Threshold Amplitude: 0.75 V
Lead Channel Pacing Threshold Amplitude: 0.75 V
Lead Channel Pacing Threshold Amplitude: 1.25 V
Lead Channel Pacing Threshold Pulse Width: 0.5 ms
Lead Channel Pacing Threshold Pulse Width: 0.5 ms
Lead Channel Pacing Threshold Pulse Width: 0.5 ms
Lead Channel Sensing Intrinsic Amplitude: 11.4 mV
Lead Channel Sensing Intrinsic Amplitude: 5 mV
Lead Channel Setting Pacing Amplitude: 1.75 V
Lead Channel Setting Pacing Amplitude: 2 V
Lead Channel Setting Pacing Amplitude: 2.5 V
Lead Channel Setting Pacing Pulse Width: 0.5 ms
Lead Channel Setting Pacing Pulse Width: 0.5 ms
Lead Channel Setting Sensing Sensitivity: 0.5 mV
Pulse Gen Serial Number: 9986369
Zone Setting Status: 755011

## 2024-06-23 NOTE — Telephone Encounter (Signed)
 Copied from CRM [id number]. Topic: Clinical - Medication Question >> Jun 22, 2024  1:32 PM Paige D wrote: Reason for CRM:  Pt saw heart Dr.,  Dr. Pietro gave her two new prescriptions and pt said she was reading the side affects and does not know if it is ok to take with her other medications and would like a call back today.

## 2024-06-23 NOTE — Telephone Encounter (Signed)
 OK to start those meds.  I would like to recheck a bmet 1 week after she starts the new meds though.

## 2024-06-23 NOTE — Telephone Encounter (Signed)
 Looks like Crenshaw started Entresto  and spironolactone . Please advise?

## 2024-06-23 NOTE — Telephone Encounter (Signed)
 Patient notified of this information, she will start medication tomorrow and was scheduled to have BMET 07/01/24

## 2024-06-25 ENCOUNTER — Other Ambulatory Visit: Payer: Self-pay | Admitting: Family

## 2024-06-25 DIAGNOSIS — M109 Gout, unspecified: Secondary | ICD-10-CM

## 2024-06-27 ENCOUNTER — Telehealth: Payer: Self-pay | Admitting: Family

## 2024-06-27 NOTE — Telephone Encounter (Signed)
 Copied from CRM #[id number]. Topic: Clinical - Medication Question >> Jun 27, 2024  2:17 PM Emylou G wrote: Reason for CRM: Semaglutide  (RYBELSUS ) 7 MG TABS dapagliflozin  propanediol (FARXIGA ) 10 MG TABS tablet Patient called. Didn't know if there is a certificate program to help save her money on her refill.. can you call her back?

## 2024-06-27 NOTE — Telephone Encounter (Signed)
 Pt wants to speak with pharmacist Tammy regarding her prescriptions  Semaglutide  (RYBELSUS ) 7 MG TABS And dapagliflozin  propanediol (FARXIGA ) 10 MG TABS tablet. She says she helped her get them for cheaper last year and wants to know if she can help her again. Please call the pt back.

## 2024-06-27 NOTE — Progress Notes (Signed)
 Remote ICD Transmission

## 2024-06-29 ENCOUNTER — Telehealth: Payer: Self-pay

## 2024-06-29 DIAGNOSIS — E119 Type 2 diabetes mellitus without complications: Secondary | ICD-10-CM

## 2024-06-29 NOTE — Progress Notes (Signed)
 Complex Care Management Note Care Guide Note  06/29/2024 Name: Briana Miller MRN: [id number] DOB: 1956/03/14   Complex Care Management Outreach Attempts: An unsuccessful telephone outreach was attempted today to offer the patient information about available complex care management services.  Follow Up Plan:  Additional outreach attempts will be made to offer the patient complex care management information and services.   Encounter Outcome:  No Answer  [name]  [facility], [facility] VBCI Assistant Direct Dial: [phone]  Fax: [phone]

## 2024-07-01 ENCOUNTER — Other Ambulatory Visit

## 2024-07-01 NOTE — Progress Notes (Signed)
 Complex Care Management Note Care Guide Note  07/01/2024 Name: Briana Miller MRN: [id number] DOB: 01/06/1956   Complex Care Management Outreach Attempts: A second unsuccessful outreach was attempted today to offer the patient with information about available complex care management services.  Follow Up Plan:  Additional outreach attempts will be made to offer the patient complex care management information and services.   Encounter Outcome:  No Answer  [name]  [facility], [facility] VBCI Assistant Direct Dial: [phone]  Fax: [phone]

## 2024-07-05 NOTE — Progress Notes (Signed)
 Complex Care Management Note  Care Guide Note 07/05/2024 Name: Briana Miller MRN: [id number] DOB: 05-Mar-1956  [name] is a 69 y.o. year old female who sees Daryl Setter, NP for primary care. I reached out to [name] by phone today to offer complex care management services.  [name] was given information about Complex Care Management services today including:   The Complex Care Management services include support from the care team which includes your Nurse Care Manager, Clinical Social Worker, or Pharmacist.  The Complex Care Management team is here to help remove barriers to the health concerns and goals most important to you. Complex Care Management services are voluntary, and the patient may decline or stop services at any time by request to their care team member.   Complex Care Management Consent Status: Patient agreed to services and verbal consent obtained.   Follow up plan:  Telephone appointment with complex care management team member scheduled for:  07/11/24 at 9:30 a.m.   Encounter Outcome:  Patient Scheduled  [name]  [facility], [facility] VBCI Assistant Direct Dial: [phone]  Fax: [phone]

## 2024-07-11 ENCOUNTER — Other Ambulatory Visit: Admitting: Pharmacist

## 2024-07-11 DIAGNOSIS — E119 Type 2 diabetes mellitus without complications: Secondary | ICD-10-CM

## 2024-07-11 DIAGNOSIS — I5022 Chronic systolic (congestive) heart failure: Secondary | ICD-10-CM

## 2024-07-11 NOTE — Progress Notes (Signed)
 "  07/11/2024 Name: [name] MRN: [id number] DOB: [birth year]  Chief Complaint  Patient presents with   Medication Management   Diabetes    Briana Miller is a 69 y.o. year old female who presented for a telephone visit.   They were referred to the pharmacist by their PCP for assistance in managing medication access.    Subjective:  Medication Access/Adherence   Patient reports affordability concerns with their medications: Yes  Patient reports access/transportation concerns to their pharmacy: No  Patient reports adherence concerns with their medications:  No      Heart Failure:  Current medications:  ACEi/ARB/ARNI: just started Entresto  generic SGLT2i: Farxiga  Beta blocker: carvedilol  Mineralocorticoid Receptor Antagonist: spironolactone  Diuretic regimen: torsemide    Current medication access support: none currently but has been screened for patient assistance program in past - did not qualify due to Household income. She has received a [employer] Grant in the past but it expired in September 2025.   Objective:  Lab Results  Component Value Date   HGBA1C 6.1 (H) 03/11/2024    Lab Results  Component Value Date   CREATININE 0.95 03/11/2024   BUN 12 03/11/2024   NA 139 03/11/2024   K 4.2 03/11/2024   CL 103 03/11/2024   CO2 25 03/11/2024    Lab Results  Component Value Date   CHOL 187 03/11/2024   HDL 71 03/11/2024   LDLCALC 99 03/11/2024   TRIG 83 03/11/2024   CHOLHDL 2.6 03/11/2024    Medications Reviewed Today     Reviewed by Carla Milling, RPH-CPP (Pharmacist) on 07/11/24 at 1000  Med List Status: <None>   Medication Order Taking? Sig Documenting Provider Last Dose Status Informant  ACCU-CHEK GUIDE TEST test strip [id number]  TEST UP TO 4 TIMES DAILY Daryl Setter, NP  Active   Accu-Chek Softclix Lancets lancets [id number]  TEST UP TO 4 TIMES DAILY O'Sullivan, [name], NP  Active   acetaminophen  (TYLENOL ) 500 MG tablet  [id number]  Take 1,000 mg by mouth every 8 (eight) hours as needed for moderate pain or headache. [provider]  Active Self  albuterol  (VENTOLIN  HFA) 108 (90 Base) MCG/ACT inhaler [id number] Yes USE 2 INHALATIONS BY MOUTH EVERY 6 HOURS AS NEEDED Neysa Rama D, MD  Active   allopurinol  (ZYLOPRIM ) 100 MG tablet [id number] Yes TAKE 1 TABLET BY MOUTH DAILY O'Sullivan, [name], NP  Active   benzonatate  (TESSALON ) 100 MG capsule [id number] Yes Take 1 capsule (100 mg total) by mouth 3 (three) times daily as needed. Daryl Setter, NP  Active   Blood Glucose Monitoring Suppl (ACCU-CHEK GUIDE) w/Device KIT [id number]  USE AS DIRECTED Daryl Setter, NP  Active   carvedilol  (COREG ) 12.5 MG tablet [id number] Yes TAKE 1 TABLET BY MOUTH TWICE  DAILY Croitoru, Mihai, MD  Active   cholecalciferol (VITAMIN D3) 25 MCG (1000 UNIT) tablet [id number]  Take 1,000 Units by mouth daily.  Patient taking differently: Take 1,000 Units by mouth 3 (three) times a week.   [provider]  Active Self  dapagliflozin  propanediol (FARXIGA ) 10 MG TABS tablet [id number] Yes Take 1 tablet (10 mg total) by mouth daily. Pietro Redell RAMAN, MD  Active   famotidine  (PEPCID ) 40 MG tablet [id number] Yes Take 1 tablet (40 mg total) by mouth daily. Daryl Setter, NP  Active   fluticasone  (FLONASE ) 50 MCG/ACT nasal spray [id number]  Place 2 sprays into both nostrils daily.  Patient taking differently: Place 2 sprays into both nostrils as needed.   O'Sullivan, [name],  NP  Active   gabapentin  (NEURONTIN ) 300 MG capsule [id number] Yes TAKE 3 CAPSULES BY MOUTH 3 TIMES DAILY  Patient taking differently: 300 mg at bedtime as needed.   Daryl Setter, NP  Active   latanoprost (XALATAN) 0.005 % ophthalmic solution [id number]  Place 1 drop into both eyes at bedtime.  [provider]  Active Self  montelukast  (SINGULAIR ) 10 MG tablet [id number] Yes TAKE 1 TABLET BY MOUTH AT  BEDTIME  Patient taking  differently: Take 10 mg by mouth as needed.   Daryl Setter, NP  Active   nortriptyline  (PAMELOR ) 10 MG capsule [id number] Yes TAKE 1 CAPSULE BY MOUTH AT  BEDTIME FOR A WEEK, THEN 2  CAPSULES BY MOUTH AT BEDTIME Daryl Setter, NP  Active   nystatin  (NYAMYC ) powder [id number]  APPLY TOPICALLY TO THE AFFECTED  AREA 3 TIMES DAILY AS NEEDED FOR DONETA Daryl, Setter, NP  Active   ondansetron  (ZOFRAN ) 4 MG tablet [id number]  TAKE 1 TABLET BY MOUTH EVERY 8  HOURS AS NEEDED FOR NAUSEA AND  VOMITING O'Sullivan, [name], NP  Active   pantoprazole  (PROTONIX ) 40 MG tablet [id number] Yes Take 1 tablet (40 mg total) by mouth daily. Daryl Setter, NP  Active   potassium chloride  SA (KLOR-CON  M) 20 MEQ tablet [id number] Yes TAKE 1 TABLET BY MOUTH DAILY O'Sullivan, [name], NP  Active   rosuvastatin  (CRESTOR ) 10 MG tablet [id number] Yes Take 1 tablet (10 mg total) by mouth daily. For cholesterol and heart. Daryl Setter, NP  Active   sacubitril -valsartan  (ENTRESTO ) 24-26 MG [id number] Yes Take 1 tablet by mouth 2 (two) times daily. Pietro Redell RAMAN, MD  Active   Semaglutide  (RYBELSUS ) 7 MG TABS [id number] Yes TAKE 1 TABLET BY MOUTH DAILY O'Sullivan, [name], NP  Active   spironolactone  (ALDACTONE ) 25 MG tablet [id number] Yes Take 0.5 tablets (12.5 mg total) by mouth daily. Pietro Redell RAMAN, MD  Active   Tart Cherry 1200 MG CAPS [id number] Yes Take 1,200 mg by mouth 2 (two) times a week. [provider]  Active Self  timolol (BETIMOL) 0.5 % ophthalmic solution [id number] Yes Place 1 drop into both eyes every morning.  [provider]  Active Self  torsemide  (DEMADEX ) 20 MG tablet [id number] Yes TAKE 2 TABLETS BY MOUTH  ALTERNATING WITH 1 TABLET BY  MOUTH EVERY OTHER DAY Pietro Redell RAMAN, MD  Active              [employer] Grant - Cardiomyopathy HealthWell ID: [id number] Patient: Lizzeth Meder Status: Approved Start Date: 06/11/2024 End Date: 06/10/2025 Assistance Type:  Co-pay Grant Balance: $7500.00  Pharmacy Card Card No: [id number] BIN: [id number] PCN: [id number] PC Group: [id number] Help Desk: [phone]  Assessment/Plan:   Heart Failure: - Currently appropriately managed - Screened for 2026 medication assistance program for Farxiga  - household income was too high. There is no patient assistance program for Medicare patients for Rybelsus  in 2026.  - Screened for [employer] for cardiomyopathy. Patient was eligible. Assisted with re-enrollment. Patient was approved for $7500 from 06/11/2024 to 06/10/2025 to use for medications related to CHF / Cardiomyopathy. Patient states she does not need Farxiga  or Entresto  at this time but will need Farxiga  in later February. Will check back then. She would like to get at [facility] so she can use the [employer] card.  - Reminded to weigh daily and when to contact cardiology with weight gain - Continue to follow up with Dr Pietro   Medication management:  - Discussed 2026 Medicare changes and her  [facility]. She has a $355 deductible to meet in 2026. Then the following medicaitons will be tier 3 with a copay of 21% of medication cost:  Rybelsus  - before deductible $1030 per 30 days, after deductible $216 Entresto  / sacubitril  + valsartan  - before deductible $97 for 100 days, after deductible $21 for 100 days Farxiga  / dapagliflozin  - before deductible $545 for 90 days, after deductible $115 for 90 day  She should reach out of pocket max of $2100 around July 2026, then cost of all meds will be $0    Follow Up Plan: 2 to 3 weeks.   Madelin Ray, PharmD Clinical Pharmacist [facility] Primary Care  Population Health [phone]   "

## 2024-07-22 ENCOUNTER — Ambulatory Visit (HOSPITAL_COMMUNITY)

## 2024-07-27 ENCOUNTER — Other Ambulatory Visit

## 2024-09-12 ENCOUNTER — Ambulatory Visit (HOSPITAL_COMMUNITY)

## 2024-09-21 ENCOUNTER — Encounter

## 2024-12-21 ENCOUNTER — Encounter

## 2025-03-22 ENCOUNTER — Encounter

## 2025-06-21 ENCOUNTER — Encounter

## 2025-09-20 ENCOUNTER — Encounter

## 2025-12-20 ENCOUNTER — Encounter

## 2026-03-21 ENCOUNTER — Encounter
